# Patient Record
Sex: Male | Born: 1955 | Race: White | Hispanic: No | Marital: Married
Health system: Southern US, Academic
[De-identification: ages and names within clinical notes are randomized; demographics above are authoritative.]

## PROBLEM LIST (undated history)

## (undated) ENCOUNTER — Ambulatory Visit

## (undated) ENCOUNTER — Encounter

## (undated) ENCOUNTER — Encounter
Attending: Student in an Organized Health Care Education/Training Program | Primary: Student in an Organized Health Care Education/Training Program

## (undated) ENCOUNTER — Telehealth
Attending: Student in an Organized Health Care Education/Training Program | Primary: Student in an Organized Health Care Education/Training Program

## (undated) ENCOUNTER — Encounter: Attending: Gastroenterology | Primary: Gastroenterology

## (undated) ENCOUNTER — Encounter
Attending: Pharmacist Clinician (PhC)/ Clinical Pharmacy Specialist | Primary: Pharmacist Clinician (PhC)/ Clinical Pharmacy Specialist

## (undated) ENCOUNTER — Encounter: Payer: MEDICARE | Attending: Gastroenterology | Primary: Gastroenterology

## (undated) ENCOUNTER — Ambulatory Visit
Payer: MEDICARE | Attending: Student in an Organized Health Care Education/Training Program | Primary: Student in an Organized Health Care Education/Training Program

## (undated) ENCOUNTER — Telehealth

## (undated) ENCOUNTER — Encounter
Payer: Medicare (Managed Care) | Attending: Student in an Organized Health Care Education/Training Program | Primary: Student in an Organized Health Care Education/Training Program

## (undated) ENCOUNTER — Telehealth
Attending: Pharmacist Clinician (PhC)/ Clinical Pharmacy Specialist | Primary: Pharmacist Clinician (PhC)/ Clinical Pharmacy Specialist

## (undated) ENCOUNTER — Encounter: Attending: Internal Medicine | Primary: Internal Medicine

## (undated) ENCOUNTER — Telehealth: Attending: Gastroenterology | Primary: Gastroenterology

## (undated) ENCOUNTER — Ambulatory Visit
Attending: Student in an Organized Health Care Education/Training Program | Primary: Student in an Organized Health Care Education/Training Program

## (undated) ENCOUNTER — Ambulatory Visit: Payer: MEDICARE

## (undated) ENCOUNTER — Ambulatory Visit: Payer: Medicare (Managed Care)

## (undated) ENCOUNTER — Encounter: Attending: Family | Primary: Family

## (undated) ENCOUNTER — Ambulatory Visit
Payer: Medicare (Managed Care) | Attending: Student in an Organized Health Care Education/Training Program | Primary: Student in an Organized Health Care Education/Training Program

## (undated) DIAGNOSIS — R519 Headache, unspecified: Secondary | ICD-10-CM

## (undated) DIAGNOSIS — F329 Major depressive disorder, single episode, unspecified: Secondary | ICD-10-CM

## (undated) DIAGNOSIS — F319 Bipolar disorder, unspecified: Secondary | ICD-10-CM

## (undated) DIAGNOSIS — Z860100 Personal history of colon polyps, unspecified: Secondary | ICD-10-CM

## (undated) DIAGNOSIS — B181 Chronic viral hepatitis B without delta-agent: Secondary | ICD-10-CM

## (undated) DIAGNOSIS — K7460 Unspecified cirrhosis of liver: Secondary | ICD-10-CM

## (undated) DIAGNOSIS — F32A Depression, unspecified: Secondary | ICD-10-CM

## (undated) DIAGNOSIS — K219 Gastro-esophageal reflux disease without esophagitis: Secondary | ICD-10-CM

## (undated) DIAGNOSIS — R51 Headache: Secondary | ICD-10-CM

## (undated) DIAGNOSIS — R634 Abnormal weight loss: Secondary | ICD-10-CM

## (undated) DIAGNOSIS — B1910 Unspecified viral hepatitis B without hepatic coma: Secondary | ICD-10-CM

## (undated) HISTORY — DX: Chronic viral hepatitis B without delta-agent: B18.1

## (undated) HISTORY — DX: Major depressive disorder, single episode, unspecified: F32.9

## (undated) HISTORY — DX: Unspecified viral hepatitis B without hepatic coma: B19.10

## (undated) HISTORY — PX: POLYPECTOMY: SHX149

## (undated) HISTORY — PX: COLONOSCOPY: SHX174

## (undated) HISTORY — DX: Depression, unspecified: F32.A

## (undated) HISTORY — PX: APPENDECTOMY: SHX54

## (undated) HISTORY — DX: Unspecified cirrhosis of liver: K74.60

## (undated) HISTORY — DX: Personal history of colonic polyps: Z86.010

## (undated) HISTORY — DX: Headache, unspecified: R51.9

## (undated) HISTORY — DX: Personal history of colon polyps, unspecified: Z86.0100

## (undated) HISTORY — DX: Bipolar disorder, unspecified: F31.9

## (undated) HISTORY — DX: Headache: R51

## (undated) HISTORY — DX: Abnormal weight loss: R63.4

## (undated) HISTORY — PX: UPPER GASTROINTESTINAL ENDOSCOPY: SHX188

## (undated) HISTORY — DX: Gastro-esophageal reflux disease without esophagitis: K21.9

---
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
## 2010-06-15 ENCOUNTER — Ambulatory Visit (HOSPITAL_COMMUNITY): Payer: Self-pay | Admitting: Behavioral Health

## 2010-06-22 ENCOUNTER — Ambulatory Visit (HOSPITAL_COMMUNITY): Payer: Self-pay | Admitting: Behavioral Health

## 2010-06-23 ENCOUNTER — Ambulatory Visit (INDEPENDENT_AMBULATORY_CARE_PROVIDER_SITE_OTHER): Payer: Self-pay | Admitting: Behavioral Health

## 2010-06-23 DIAGNOSIS — F3112 Bipolar disorder, current episode manic without psychotic features, moderate: Secondary | ICD-10-CM

## 2010-07-01 ENCOUNTER — Encounter (HOSPITAL_COMMUNITY): Payer: Self-pay | Admitting: Behavioral Health

## 2010-07-01 ENCOUNTER — Emergency Department (HOSPITAL_COMMUNITY)
Admission: EM | Admit: 2010-07-01 | Discharge: 2010-07-04 | Disposition: A | Discharge status: Other Acute Inpatient Hospital | Payer: BC Managed Care – PPO | Attending: Emergency Medicine | Admitting: Emergency Medicine

## 2010-07-01 DIAGNOSIS — F319 Bipolar disorder, unspecified: Secondary | ICD-10-CM | POA: Insufficient documentation

## 2010-07-01 DIAGNOSIS — F17200 Nicotine dependence, unspecified, uncomplicated: Secondary | ICD-10-CM | POA: Insufficient documentation

## 2010-07-01 DIAGNOSIS — Z79899 Other long term (current) drug therapy: Secondary | ICD-10-CM | POA: Insufficient documentation

## 2010-07-01 LAB — COMPREHENSIVE METABOLIC PANEL
ALT: 38 U/L (ref 0–53)
AST: 30 U/L (ref 0–37)
Albumin: 3.6 g/dL (ref 3.5–5.2)
CO2: 29 mEq/L (ref 19–32)
Calcium: 8.6 mg/dL (ref 8.4–10.5)
GFR calc Af Amer: >60 mL/min (ref >60)
GFR calc non Af Amer: >60 mL/min (ref >60)
Sodium: 138 mEq/L (ref 135–145)
Total Protein: 6.2 g/dL (ref 6.0–8.3)

## 2010-07-01 LAB — RAPID URINE DRUG SCREEN, HOSP PERFORMED
Amphetamines: NOT DETECTED
Tetrahydrocannabinol: NOT DETECTED

## 2010-07-01 LAB — CBC
HCT: 40.5 % (ref 39.0–52.0)
RDW: 13.2 % (ref 11.5–15.5)
WBC: 7.5 10*3/uL (ref 4.0–10.5)

## 2010-07-01 LAB — DIFFERENTIAL
Basophils Absolute: 0 10*3/uL (ref 0.0–0.1)
Basophils Relative: 0 % (ref 0–1)
Eosinophils Relative: 1 % (ref 0–5)
Lymphocytes Relative: 27 % (ref 12–46)
Neutro Abs: 4.4 10*3/uL (ref 1.7–7.7)

## 2010-07-01 LAB — ETHANOL: Alcohol, Ethyl (B): 8 mg/dL (ref 0–10)

## 2010-07-02 DIAGNOSIS — F319 Bipolar disorder, unspecified: Secondary | ICD-10-CM

## 2010-07-03 DIAGNOSIS — F319 Bipolar disorder, unspecified: Secondary | ICD-10-CM

## 2010-07-04 ENCOUNTER — Inpatient Hospital Stay (HOSPITAL_COMMUNITY)
Admission: AD | Admit: 2010-07-04 | Discharge: 2010-07-15 | DRG: 430 | Disposition: A | Discharge status: Home / Self Care | Payer: BC Managed Care – PPO | Attending: Psychiatry | Admitting: Psychiatry

## 2010-07-04 DIAGNOSIS — F3110 Bipolar disorder, current episode manic without psychotic features, unspecified: Secondary | ICD-10-CM

## 2010-07-04 DIAGNOSIS — Z560 Unemployment, unspecified: Secondary | ICD-10-CM

## 2010-07-04 DIAGNOSIS — B181 Chronic viral hepatitis B without delta-agent: Secondary | ICD-10-CM

## 2010-07-04 DIAGNOSIS — Z91199 Patient's noncompliance with other medical treatment and regimen due to unspecified reason: Secondary | ICD-10-CM

## 2010-07-04 DIAGNOSIS — Z9119 Patient's noncompliance with other medical treatment and regimen: Secondary | ICD-10-CM

## 2010-07-04 DIAGNOSIS — F312 Bipolar disorder, current episode manic severe with psychotic features: Secondary | ICD-10-CM

## 2010-07-06 NOTE — H&P (Signed)
Angel [name], Angel [name]               ACCOUNT NO.:  [id number]  MEDICAL RECORD NO.:  [id number]           PATIENT TYPE:  I  LOCATION:  0404                          FACILITY:  [facility]  PHYSICIAN:  Eulogio Ditch, MD DATE OF BIRTH:  March 06, 1956  DATE OF ADMISSION:  07/04/2010 DATE OF DISCHARGE:                      PSYCHIATRIC ADMISSION ASSESSMENT   HISTORY OF PRESENT ILLNESS:  A 55 year old white male with a history of bipolar disorder type 1 living with same-sex partner and not working and was admitted on IVC.  The patient was noncompliant with his medications. Before admission to the hospital, the patient was on lithium and Klonopin.  The patient has a number of admissions in the past to [facility] to [facility] and [facility].  The patient does not follow any regular psychiatrist in the outpatient setting.  The patient told me he has bipolar started around 2000 and every 5 years he goes into the manic phase and when he goes into the manic phase, he is close to [place], he spends a lot of money, he helps other people.  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  The patient before coming to the hospital walked from [place] to [facility] for 30 miles from 10:00 p.m. to 8:00 a.m. continuously.  During the interview, the patient is having elated mood but he is not irritable.  His talkative but does not have pressured speech.  His sleep is fair.  He has flight of ideas.  He has grandiose delusions.  He has insight into his illness and on asking why he is not taking his medications, he told me that he ran out the medications.  The patient denies hearing any voices, does not seem to be paranoid.  The patient denies any suicidal ideations.  The patient asked me from which country I am. I told him I am from [place] and then he started discussing about [facility].  He has pretty good knowledge about [employer].  The patient  denies abuse of any drugs.    Psych medication:  The patient was on fluphenazine 1, lithium and Klonopin before admission to the hospital but he told me he does not like fluphenazine.    Medical history:  The patient has a history of a hepatitis B carrier.   Allergic to Lamictal.   His labs within normal limits done at [facility].  PHYSICAL EXAMINATION:  Within normal limits.  His lithium level at the time of admission was less than 0.25.  MENTAL STATUS EXAM:  The patient is calm, cooperative during the interview.  No psychomotor agitation or retardation noted during the interview.  No abnormal movements noted.  The patient has an elated mood, affect mood congruent.  Thought process:  The patient is having at certain times during interview circumstantiality and other times tangentiality.  Also having flight of ideas, but his thoughts are connected with each other.  Thought content:  Denies suicidal ideations. Grandiose delusions present.  Thought perception:  Denies audiovisual hallucination, does not seem to be internally preoccupied.  Cognition: Alert, awake, oriented x3.  Memory immediate, recent remote fair to poor attention and concentration fair.  Abstraction ability fair.  Insight and judgment poor.  DIAGNOSIS:  AXIS I:  Bipolar disorder type 1 in manic phase. AXIS II:  Deferred. AXIS III:  Hepatitis B carrier, no active medical issue. AXIS IV:  Noncompliant with his medications. AXIS V:  30-40.  TREATMENT PLAN: 1. The patient is on lithium 300 mg t.i.d. 2. Klonopin 0.5 b.i.d. 3. Risperdal 1 mg 3 times a day.  The patient will be continued on     these medications and will be slowly titrated on these medications. 4. Estimated length of stay in the hospital will be 1-2 weeks. 5. I discussed with the patient importance of compliance with     medications. 6. I told the patient that he should go to all the groups and remain     compliant with the staff and the  treatment during the hospital     stay. 7. We will speak with his partner to get more collateral information     on the patient and his previous records from the past     hospitalizations.     Eulogio Ditch, MD     SA/MEDQ  D:  07/05/2010  T:  07/05/2010  Job:  [id number]  Electronically Signed by Eulogio Ditch  on 07/06/2010 01:06:46 PM

## 2010-07-09 DIAGNOSIS — F3110 Bipolar disorder, current episode manic without psychotic features, unspecified: Secondary | ICD-10-CM

## 2010-07-11 ENCOUNTER — Encounter (HOSPITAL_COMMUNITY): Payer: Self-pay | Admitting: Behavioral Health

## 2010-07-14 LAB — COMPREHENSIVE METABOLIC PANEL
ALT: 41 U/L (ref 0–53)
AST: 22 U/L (ref 0–37)
Alkaline Phosphatase: 60 U/L (ref 39–117)
CO2: 27 mEq/L (ref 19–32)
Chloride: 106 mEq/L (ref 96–112)
GFR calc Af Amer: >60 mL/min (ref >60)
GFR calc non Af Amer: >60 mL/min (ref >60)
Glucose, Bld: 98 mg/dL (ref 70–99)
Potassium: 4 mEq/L (ref 3.5–5.1)
Sodium: 138 mEq/L (ref 135–145)
Total Bilirubin: 0.6 mg/dL (ref 0.3–1.2)

## 2010-07-14 LAB — LITHIUM LEVEL: Lithium Lvl: 0.53 mEq/L — ABNORMAL LOW (ref 0.80–1.40)

## 2010-07-22 ENCOUNTER — Encounter (INDEPENDENT_AMBULATORY_CARE_PROVIDER_SITE_OTHER): Payer: BC Managed Care – PPO | Admitting: Behavioral Health

## 2010-07-22 DIAGNOSIS — F3189 Other bipolar disorder: Secondary | ICD-10-CM

## 2010-07-28 ENCOUNTER — Encounter (HOSPITAL_COMMUNITY): Payer: BC Managed Care – PPO

## 2010-07-28 DIAGNOSIS — F3110 Bipolar disorder, current episode manic without psychotic features, unspecified: Secondary | ICD-10-CM

## 2010-08-04 NOTE — Discharge Summary (Signed)
NAMEMEER, REINDL               ACCOUNT NO.:  [id number]  MEDICAL RECORD NO.:  [id number]           PATIENT TYPE:  I  LOCATION:  0404                          FACILITY:  BH  PHYSICIAN:  Eulogio Ditch, MD DATE OF BIRTH:  Aug 24, 1955  DATE OF ADMISSION:  07/04/2010 DATE OF DISCHARGE:  07/15/2010                              DISCHARGE SUMMARY   IDENTIFYING INFORMATION:  This is a 55 year old Caucasian male.  This was an involuntary admission.  HISTORY OF PRESENT ILLNESS:  Angel [name] presented on involuntary commitment with an exacerbation of his of bipolar one disorder.  He had not currently been receiving any regular outpatient treatment and was aware that he was going into a manic phase which coincided with his usual 5-year cycle of having manic episodes.  He initially presented after having walked 30 miles from one county the [place] in search of care.  He presented with some elated mood, talkative with non-pressured speech, hyperverbal and some grandiose thinking.  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  He denied any suicidal intent.  Medical evaluation, diagnostic studies and physical exam were done in the [facility] emergency room. [name], healthy-appearing 55 year old who appears somewhat younger than his stated age. He has a history of being a hepatitis B carrier, but no other chronic medical conditions.  Basic diagnostic studies done in the emergency room were normal.  Lithium level at the time of admission was less than 0.25.  HOSPITAL COURSE:  He was admitted to our acute stabilization and intensive care unit and we elected to continue his lithium 300 mg t.i.d., Klonopin 0.5 mg b.i.d. and to that we elected to add Risperdal 1 mg t.i.d.  Estimated his length of stay at 1-2 weeks.  Throughout his stay Angel [name] remained appropriate with good  participation in group therapy and unit activities.  He was given an initial working diagnosis of bipolar disorder type 1, manic phase. He gave [place] permission to speak to his friend with whom he lives.  For the first week he continued to have some mildly intrusive verbal behavior and had decreased impulse control, but was generally cooperative.  Felt he was responding well to the medication and remained compliant with medications.  He received Risperdal Consta 25 mg IM on March 1st which he tolerated well.  He was briefly on one-to-one observation on March 3rd for some agitation after a disagreement about phone privileges but that resolved quickly.  He was directable and had no thoughts of harming himself or others.  On March 5th he was still articulating, having some intrusive thoughts and felt compelled to accomplish or do specific activities.  Still felt that his thoughts were going rather fast. Denying any suicidal or homicidal intent.  He sleep was continuing to improve and he felt that he was sleeping well.  LABORATORY DATA:  Performed on March 8th.  Normal electrolytes, BUN 11, creatinine 0.92.  Calcium level of 8.7. Normal liver enzymes. Lithium level of 0.53.  Sleep was improving but was still limited to  less than 4 hours a night.  He was frequently writing excessively and keeping a long diary through the night.  We elected to increase his Risperdal Consta to 50 mg every 2 weeks and he received an additional dose.  Risperdal was titrated to 3 mg p.o. q.h.s. and Klonopin 1 mg p.o. q.h.s., lithium was increased 1200 mg daily. By the 9th he was no longer writing excessively. He had stopped keeping a long diary and stopped writing excessive letters, felt that his thoughts had slowed down to normal and insight was much improved.  He was displaying no intrusive behavior. Concentration was much improved.  Continued to voice no dangerous ideas and felt ready to go home.  DISCHARGE  DIAGNOSES:  AXIS I: Bipolar disorder type 1, manic phase, resolving. AXIS II: No diagnosis. AXIS III: No diagnosis. AXIS IV: Deferred. AXIS V: Current 58, past year 68 estimated.  DISCHARGE MEDICATIONS:  Clonazepam 1 mg q.h.s., diphenhydramine 50 mg two capsules q.h.s., lithium carbonate 300 mg four times daily, Risperdal 3 mg tablet q.h.s., Risperdal Consta 50 mg IM every 14 days, next due March 20th, Abreva cream topical to cold sore five times daily as needed.  He was instructed to stop taking fluphenazine.  DISCHARGE FOLLOWUP:  He will follow-up counseling with Angel [name] in our [place] office on March 16th at 11:00 a.m. and follow up with Angel [name] in our [place] office on April 13th at 8:30 a.m.     Angel [name], N.P.   ______________________________ Eulogio Ditch, MD    MAS/MEDQ  D:  08/04/2010  T:  08/04/2010  Job:  [id number]  Electronically Signed by Kari Baars N.P. on 08/04/2010 10:07:44 AM Electronically Signed by Eulogio Ditch  on 08/04/2010 03:48:09 PM

## 2010-08-08 ENCOUNTER — Encounter (HOSPITAL_COMMUNITY): Payer: BC Managed Care – PPO | Admitting: Behavioral Health

## 2010-08-11 ENCOUNTER — Encounter (HOSPITAL_COMMUNITY): Payer: BC Managed Care – PPO

## 2010-08-11 DIAGNOSIS — F319 Bipolar disorder, unspecified: Secondary | ICD-10-CM

## 2010-08-15 ENCOUNTER — Encounter (HOSPITAL_COMMUNITY): Payer: BC Managed Care – PPO | Admitting: Behavioral Health

## 2010-08-17 ENCOUNTER — Encounter (INDEPENDENT_AMBULATORY_CARE_PROVIDER_SITE_OTHER): Payer: BC Managed Care – PPO | Admitting: Behavioral Health

## 2010-08-17 DIAGNOSIS — F3189 Other bipolar disorder: Secondary | ICD-10-CM

## 2010-08-19 ENCOUNTER — Ambulatory Visit (INDEPENDENT_AMBULATORY_CARE_PROVIDER_SITE_OTHER): Payer: BC Managed Care – PPO | Admitting: Psychiatry

## 2010-08-19 DIAGNOSIS — F319 Bipolar disorder, unspecified: Secondary | ICD-10-CM

## 2010-08-31 ENCOUNTER — Encounter (HOSPITAL_COMMUNITY): Payer: BC Managed Care – PPO

## 2010-08-31 DIAGNOSIS — F3110 Bipolar disorder, current episode manic without psychotic features, unspecified: Secondary | ICD-10-CM

## 2010-09-01 ENCOUNTER — Encounter (HOSPITAL_COMMUNITY): Payer: BC Managed Care – PPO | Admitting: Behavioral Health

## 2010-09-01 ENCOUNTER — Encounter (INDEPENDENT_AMBULATORY_CARE_PROVIDER_SITE_OTHER): Payer: BC Managed Care – PPO | Admitting: Behavioral Health

## 2010-09-01 DIAGNOSIS — F3110 Bipolar disorder, current episode manic without psychotic features, unspecified: Secondary | ICD-10-CM

## 2010-09-14 ENCOUNTER — Encounter (INDEPENDENT_AMBULATORY_CARE_PROVIDER_SITE_OTHER): Payer: BC Managed Care – PPO | Admitting: Behavioral Health

## 2010-09-14 DIAGNOSIS — F3110 Bipolar disorder, current episode manic without psychotic features, unspecified: Secondary | ICD-10-CM

## 2010-09-16 ENCOUNTER — Ambulatory Visit (HOSPITAL_COMMUNITY): Payer: BC Managed Care – PPO | Admitting: Physician Assistant

## 2010-09-16 DIAGNOSIS — F319 Bipolar disorder, unspecified: Secondary | ICD-10-CM

## 2010-09-28 ENCOUNTER — Encounter (INDEPENDENT_AMBULATORY_CARE_PROVIDER_SITE_OTHER): Payer: BC Managed Care – PPO | Admitting: Behavioral Health

## 2010-09-28 DIAGNOSIS — F3110 Bipolar disorder, current episode manic without psychotic features, unspecified: Secondary | ICD-10-CM

## 2010-10-13 ENCOUNTER — Encounter (HOSPITAL_COMMUNITY): Payer: BC Managed Care – PPO | Admitting: Physician Assistant

## 2010-10-14 ENCOUNTER — Encounter (HOSPITAL_COMMUNITY): Payer: BC Managed Care – PPO | Admitting: Physician Assistant

## 2010-10-17 ENCOUNTER — Encounter (HOSPITAL_COMMUNITY): Payer: BC Managed Care – PPO | Admitting: Behavioral Health

## 2010-10-24 ENCOUNTER — Encounter (INDEPENDENT_AMBULATORY_CARE_PROVIDER_SITE_OTHER): Payer: BC Managed Care – PPO | Admitting: Behavioral Health

## 2010-10-24 DIAGNOSIS — F3110 Bipolar disorder, current episode manic without psychotic features, unspecified: Secondary | ICD-10-CM

## 2010-10-27 ENCOUNTER — Encounter (HOSPITAL_COMMUNITY): Payer: BC Managed Care – PPO

## 2010-10-27 DIAGNOSIS — F3112 Bipolar disorder, current episode manic without psychotic features, moderate: Secondary | ICD-10-CM

## 2010-11-10 ENCOUNTER — Encounter (HOSPITAL_COMMUNITY): Payer: BC Managed Care – PPO | Admitting: Physician Assistant

## 2010-11-10 DIAGNOSIS — F339 Major depressive disorder, recurrent, unspecified: Secondary | ICD-10-CM

## 2010-11-14 ENCOUNTER — Encounter (INDEPENDENT_AMBULATORY_CARE_PROVIDER_SITE_OTHER): Payer: BC Managed Care – PPO | Admitting: Behavioral Health

## 2010-11-14 DIAGNOSIS — F3110 Bipolar disorder, current episode manic without psychotic features, unspecified: Secondary | ICD-10-CM

## 2010-11-24 ENCOUNTER — Encounter (HOSPITAL_COMMUNITY): Payer: BC Managed Care – PPO

## 2010-11-28 ENCOUNTER — Encounter (INDEPENDENT_AMBULATORY_CARE_PROVIDER_SITE_OTHER): Payer: BC Managed Care – PPO | Admitting: Behavioral Health

## 2010-11-28 DIAGNOSIS — F3110 Bipolar disorder, current episode manic without psychotic features, unspecified: Secondary | ICD-10-CM

## 2010-12-08 ENCOUNTER — Encounter (INDEPENDENT_AMBULATORY_CARE_PROVIDER_SITE_OTHER): Payer: BC Managed Care – PPO

## 2010-12-08 DIAGNOSIS — F3110 Bipolar disorder, current episode manic without psychotic features, unspecified: Secondary | ICD-10-CM

## 2010-12-12 ENCOUNTER — Encounter (INDEPENDENT_AMBULATORY_CARE_PROVIDER_SITE_OTHER): Payer: BC Managed Care – PPO | Admitting: Behavioral Health

## 2010-12-12 DIAGNOSIS — F3110 Bipolar disorder, current episode manic without psychotic features, unspecified: Secondary | ICD-10-CM

## 2010-12-22 ENCOUNTER — Encounter (HOSPITAL_COMMUNITY): Payer: BC Managed Care – PPO

## 2010-12-22 DIAGNOSIS — F319 Bipolar disorder, unspecified: Secondary | ICD-10-CM

## 2010-12-26 ENCOUNTER — Encounter (INDEPENDENT_AMBULATORY_CARE_PROVIDER_SITE_OTHER): Payer: BC Managed Care – PPO | Admitting: Behavioral Health

## 2010-12-26 DIAGNOSIS — F3110 Bipolar disorder, current episode manic without psychotic features, unspecified: Secondary | ICD-10-CM

## 2011-01-05 ENCOUNTER — Encounter (HOSPITAL_COMMUNITY): Payer: BC Managed Care – PPO

## 2011-01-05 ENCOUNTER — Encounter (INDEPENDENT_AMBULATORY_CARE_PROVIDER_SITE_OTHER): Payer: BC Managed Care – PPO

## 2011-01-05 DIAGNOSIS — F319 Bipolar disorder, unspecified: Secondary | ICD-10-CM

## 2011-01-11 ENCOUNTER — Encounter (INDEPENDENT_AMBULATORY_CARE_PROVIDER_SITE_OTHER): Payer: BC Managed Care – PPO | Admitting: Behavioral Health

## 2011-01-11 DIAGNOSIS — F3110 Bipolar disorder, current episode manic without psychotic features, unspecified: Secondary | ICD-10-CM

## 2011-01-12 ENCOUNTER — Encounter (INDEPENDENT_AMBULATORY_CARE_PROVIDER_SITE_OTHER): Payer: BC Managed Care – PPO | Admitting: Physician Assistant

## 2011-01-12 DIAGNOSIS — F319 Bipolar disorder, unspecified: Secondary | ICD-10-CM

## 2011-01-19 ENCOUNTER — Encounter (INDEPENDENT_AMBULATORY_CARE_PROVIDER_SITE_OTHER): Payer: BC Managed Care – PPO

## 2011-01-19 DIAGNOSIS — F319 Bipolar disorder, unspecified: Secondary | ICD-10-CM

## 2011-01-26 ENCOUNTER — Encounter (INDEPENDENT_AMBULATORY_CARE_PROVIDER_SITE_OTHER): Payer: BC Managed Care – PPO | Admitting: Behavioral Health

## 2011-01-26 DIAGNOSIS — F3110 Bipolar disorder, current episode manic without psychotic features, unspecified: Secondary | ICD-10-CM

## 2011-02-02 ENCOUNTER — Encounter (HOSPITAL_COMMUNITY): Payer: BC Managed Care – PPO

## 2011-02-06 ENCOUNTER — Encounter (HOSPITAL_COMMUNITY): Payer: BC Managed Care – PPO | Admitting: Physician Assistant

## 2011-02-08 ENCOUNTER — Encounter (INDEPENDENT_AMBULATORY_CARE_PROVIDER_SITE_OTHER): Payer: BC Managed Care – PPO | Admitting: Behavioral Health

## 2011-02-08 DIAGNOSIS — F3110 Bipolar disorder, current episode manic without psychotic features, unspecified: Secondary | ICD-10-CM

## 2011-02-15 ENCOUNTER — Encounter (HOSPITAL_COMMUNITY): Payer: BC Managed Care – PPO

## 2011-02-16 ENCOUNTER — Encounter (INDEPENDENT_AMBULATORY_CARE_PROVIDER_SITE_OTHER): Payer: BC Managed Care – PPO | Admitting: *Deleted

## 2011-02-16 DIAGNOSIS — F319 Bipolar disorder, unspecified: Secondary | ICD-10-CM

## 2011-02-21 ENCOUNTER — Encounter (HOSPITAL_COMMUNITY): Payer: BC Managed Care – PPO | Admitting: Behavioral Health

## 2011-02-22 ENCOUNTER — Encounter (INDEPENDENT_AMBULATORY_CARE_PROVIDER_SITE_OTHER): Payer: BC Managed Care – PPO | Admitting: Physician Assistant

## 2011-02-22 DIAGNOSIS — F319 Bipolar disorder, unspecified: Secondary | ICD-10-CM

## 2011-02-23 ENCOUNTER — Encounter (HOSPITAL_COMMUNITY): Payer: BC Managed Care – PPO

## 2011-03-01 ENCOUNTER — Encounter (INDEPENDENT_AMBULATORY_CARE_PROVIDER_SITE_OTHER): Payer: BC Managed Care – PPO | Admitting: *Deleted

## 2011-03-01 DIAGNOSIS — F319 Bipolar disorder, unspecified: Secondary | ICD-10-CM

## 2011-03-02 ENCOUNTER — Encounter (HOSPITAL_COMMUNITY): Payer: BC Managed Care – PPO

## 2011-03-07 ENCOUNTER — Encounter (INDEPENDENT_AMBULATORY_CARE_PROVIDER_SITE_OTHER): Payer: BC Managed Care – PPO | Admitting: Behavioral Health

## 2011-03-07 DIAGNOSIS — F3110 Bipolar disorder, current episode manic without psychotic features, unspecified: Secondary | ICD-10-CM

## 2011-03-15 ENCOUNTER — Ambulatory Visit (INDEPENDENT_AMBULATORY_CARE_PROVIDER_SITE_OTHER): Payer: BC Managed Care – PPO | Admitting: *Deleted

## 2011-03-15 DIAGNOSIS — F319 Bipolar disorder, unspecified: Secondary | ICD-10-CM

## 2011-03-15 MED ORDER — RISPERIDONE MICROSPHERES 50 MG IM SUSR
50.0000 mg | Freq: Once | INTRAMUSCULAR | Status: AC
  Administered 2011-03-15: 50 mg via INTRAMUSCULAR

## 2011-03-28 ENCOUNTER — Ambulatory Visit (INDEPENDENT_AMBULATORY_CARE_PROVIDER_SITE_OTHER): Payer: BC Managed Care – PPO | Admitting: *Deleted

## 2011-03-28 DIAGNOSIS — F3132 Bipolar disorder, current episode depressed, moderate: Secondary | ICD-10-CM

## 2011-03-28 MED ORDER — RISPERIDONE MICROSPHERES 50 MG IM SUSR: 50.0000 mg | INTRAMUSCULAR | Status: DC

## 2011-03-28 MED ORDER — RISPERIDONE MICROSPHERES 50 MG IM SUSR
50.0000 mg | INTRAMUSCULAR | Status: AC
  Administered 2011-03-28: 50 mg via INTRAMUSCULAR

## 2011-03-29 ENCOUNTER — Other Ambulatory Visit (HOSPITAL_COMMUNITY): Payer: Self-pay | Admitting: *Deleted

## 2011-03-29 MED ORDER — CLONAZEPAM 1 MG PO TABS: 1.0000 mg | ORAL_TABLET | Freq: Two times a day (BID) | ORAL | Status: DC

## 2011-04-04 ENCOUNTER — Encounter (HOSPITAL_COMMUNITY): Payer: BC Managed Care – PPO | Admitting: Behavioral Health

## 2011-04-12 ENCOUNTER — Ambulatory Visit (HOSPITAL_COMMUNITY): Payer: BC Managed Care – PPO | Admitting: Physician Assistant

## 2011-04-18 ENCOUNTER — Encounter (HOSPITAL_COMMUNITY): Payer: BC Managed Care – PPO | Admitting: Behavioral Health

## 2011-04-23 ENCOUNTER — Other Ambulatory Visit (HOSPITAL_COMMUNITY): Payer: Self-pay | Admitting: Physician Assistant

## 2011-04-24 ENCOUNTER — Other Ambulatory Visit (HOSPITAL_COMMUNITY): Payer: Self-pay | Admitting: Physician Assistant

## 2011-04-24 DIAGNOSIS — F319 Bipolar disorder, unspecified: Secondary | ICD-10-CM

## 2011-04-24 MED ORDER — VENLAFAXINE HCL ER 150 MG PO CP24: 150.0000 mg | ORAL_CAPSULE | Freq: Every day | ORAL | Status: DC

## 2011-04-25 ENCOUNTER — Other Ambulatory Visit (HOSPITAL_COMMUNITY): Payer: Self-pay | Admitting: Physician Assistant

## 2011-04-25 DIAGNOSIS — F319 Bipolar disorder, unspecified: Secondary | ICD-10-CM

## 2011-04-25 MED ORDER — RISPERIDONE MICROSPHERES 50 MG IM SUSR: 50.0000 mg | INTRAMUSCULAR | Status: DC

## 2011-04-27 ENCOUNTER — Ambulatory Visit (HOSPITAL_COMMUNITY): Payer: BC Managed Care – PPO

## 2011-04-27 ENCOUNTER — Ambulatory Visit (HOSPITAL_COMMUNITY): Payer: BC Managed Care – PPO | Admitting: *Deleted

## 2011-04-27 ENCOUNTER — Other Ambulatory Visit (HOSPITAL_COMMUNITY): Payer: Self-pay | Admitting: Physician Assistant

## 2011-04-27 DIAGNOSIS — F319 Bipolar disorder, unspecified: Secondary | ICD-10-CM

## 2011-04-27 MED ORDER — VENLAFAXINE HCL ER 150 MG PO CP24: ORAL_CAPSULE | ORAL | Status: DC

## 2011-05-10 ENCOUNTER — Other Ambulatory Visit (HOSPITAL_COMMUNITY): Payer: Self-pay | Admitting: Physician Assistant

## 2011-05-10 DIAGNOSIS — F3132 Bipolar disorder, current episode depressed, moderate: Secondary | ICD-10-CM

## 2011-05-11 ENCOUNTER — Other Ambulatory Visit (HOSPITAL_COMMUNITY): Payer: Self-pay | Admitting: Physician Assistant

## 2011-05-11 DIAGNOSIS — F3132 Bipolar disorder, current episode depressed, moderate: Secondary | ICD-10-CM

## 2011-05-12 ENCOUNTER — Other Ambulatory Visit (HOSPITAL_COMMUNITY): Payer: Self-pay

## 2011-05-12 ENCOUNTER — Other Ambulatory Visit (HOSPITAL_COMMUNITY): Payer: Self-pay | Admitting: Physician Assistant

## 2011-05-12 DIAGNOSIS — F319 Bipolar disorder, unspecified: Secondary | ICD-10-CM

## 2011-05-12 MED ORDER — CLONAZEPAM 1 MG PO TABS: 1.0000 mg | ORAL_TABLET | Freq: Two times a day (BID) | ORAL | Status: DC

## 2011-05-15 ENCOUNTER — Ambulatory Visit (HOSPITAL_COMMUNITY): Payer: BC Managed Care – PPO | Admitting: *Deleted

## 2011-05-15 DIAGNOSIS — F319 Bipolar disorder, unspecified: Secondary | ICD-10-CM

## 2011-05-15 MED ORDER — RISPERIDONE MICROSPHERES 50 MG IM SUSR
50.0000 mg | INTRAMUSCULAR | Status: DC
  Administered 2011-05-15 – 2011-07-10 (×4): 50 mg via INTRAMUSCULAR

## 2011-05-25 ENCOUNTER — Other Ambulatory Visit (HOSPITAL_COMMUNITY): Payer: Self-pay | Admitting: Physician Assistant

## 2011-05-25 DIAGNOSIS — F319 Bipolar disorder, unspecified: Secondary | ICD-10-CM

## 2011-05-29 ENCOUNTER — Ambulatory Visit (HOSPITAL_COMMUNITY): Payer: BC Managed Care – PPO | Admitting: *Deleted

## 2011-06-01 ENCOUNTER — Ambulatory Visit (INDEPENDENT_AMBULATORY_CARE_PROVIDER_SITE_OTHER): Payer: BC Managed Care – PPO | Admitting: Physician Assistant

## 2011-06-01 DIAGNOSIS — F319 Bipolar disorder, unspecified: Secondary | ICD-10-CM

## 2011-06-05 NOTE — Progress Notes (Signed)
   [facility] Behavioral Health Follow-up Outpatient Visit  Angel [name] August 19, 1955  Date: 06/01/11   Subjective: Angel [name] was seen with his partner today, to followup on his medications for bipolar disorder. He reports that he has been doing very well, that his mood has been stable, he has no suicidal or homicidal ideation, he has not experienced any auditory or visual hallucinations. He is sleeping well, if not maybe a little too much. His partner feels that Angel [name] sleeps through the day after Angel [name] takes him to work.  There were no vitals filed for this visit.  Mental Status Examination  Appearance: Well groomed and casually dressed Alert: Yes Attention: good  Cooperative: Yes Eye Contact: Good Speech: Clear and even Psychomotor Activity: Normal Memory/Concentration: Intact Oriented: person, place, time/date and situation Mood: Euthymic Affect: Blunt Thought Processes and Associations: Goal Directed, Intact and Linear Fund of Knowledge: Good Thought Content:  Insight: Good Judgement: Good  Diagnosis: Bipolar disorder not otherwise specified  Treatment Plan: Continue current medications and check laboratory studies. Followup in 3 months.  Angel Mesick, PA

## 2011-06-06 ENCOUNTER — Other Ambulatory Visit (HOSPITAL_COMMUNITY): Payer: Self-pay | Admitting: Physician Assistant

## 2011-06-06 DIAGNOSIS — F319 Bipolar disorder, unspecified: Secondary | ICD-10-CM

## 2011-06-13 ENCOUNTER — Ambulatory Visit (HOSPITAL_COMMUNITY): Payer: BC Managed Care – PPO | Admitting: *Deleted

## 2011-06-13 ENCOUNTER — Other Ambulatory Visit (HOSPITAL_COMMUNITY): Payer: Self-pay | Admitting: *Deleted

## 2011-06-13 DIAGNOSIS — F319 Bipolar disorder, unspecified: Secondary | ICD-10-CM

## 2011-06-13 MED ORDER — CLONAZEPAM 1 MG PO TABS: 1.0000 mg | ORAL_TABLET | Freq: Two times a day (BID) | ORAL | Status: DC

## 2011-06-26 ENCOUNTER — Ambulatory Visit (INDEPENDENT_AMBULATORY_CARE_PROVIDER_SITE_OTHER): Payer: BC Managed Care – PPO | Admitting: *Deleted

## 2011-06-26 DIAGNOSIS — F319 Bipolar disorder, unspecified: Secondary | ICD-10-CM

## 2011-07-10 ENCOUNTER — Other Ambulatory Visit (HOSPITAL_COMMUNITY): Payer: Self-pay | Admitting: *Deleted

## 2011-07-10 ENCOUNTER — Ambulatory Visit (INDEPENDENT_AMBULATORY_CARE_PROVIDER_SITE_OTHER): Payer: BC Managed Care – PPO | Admitting: *Deleted

## 2011-07-10 DIAGNOSIS — F319 Bipolar disorder, unspecified: Secondary | ICD-10-CM

## 2011-07-10 MED ORDER — RISPERIDONE MICROSPHERES 50 MG IM SUSR: 50.0000 mg | INTRAMUSCULAR | Status: DC

## 2011-07-10 MED ORDER — CLONAZEPAM 1 MG PO TABS: 1.0000 mg | ORAL_TABLET | Freq: Two times a day (BID) | ORAL | Status: DC

## 2011-07-26 ENCOUNTER — Ambulatory Visit (INDEPENDENT_AMBULATORY_CARE_PROVIDER_SITE_OTHER): Payer: BC Managed Care – PPO | Admitting: *Deleted

## 2011-07-26 DIAGNOSIS — Z79899 Other long term (current) drug therapy: Secondary | ICD-10-CM

## 2011-07-26 DIAGNOSIS — F319 Bipolar disorder, unspecified: Secondary | ICD-10-CM

## 2011-07-26 MED ORDER — RISPERIDONE MICROSPHERES 50 MG IM SUSR
50.0000 mg | INTRAMUSCULAR | Status: DC
  Administered 2011-07-26: 50 mg via INTRAMUSCULAR

## 2011-07-26 NOTE — Progress Notes (Signed)
Addended by: Tonny Bollman on: 07/26/2011 04:04 PM   Modules accepted: Orders

## 2011-07-26 NOTE — Progress Notes (Signed)
Addended by: Tonny Bollman on: 07/26/2011 04:02 PM   Modules accepted: Orders

## 2011-07-26 NOTE — Progress Notes (Signed)
Addended by: Tonny Bollman on: 07/26/2011 04:12 PM   Modules accepted: Orders

## 2011-08-14 ENCOUNTER — Other Ambulatory Visit (HOSPITAL_COMMUNITY): Payer: Self-pay | Admitting: *Deleted

## 2011-08-14 DIAGNOSIS — F319 Bipolar disorder, unspecified: Secondary | ICD-10-CM

## 2011-08-14 MED ORDER — RISPERIDONE MICROSPHERES 50 MG IM SUSR: 50.0000 mg | INTRAMUSCULAR | Status: DC

## 2011-08-15 ENCOUNTER — Ambulatory Visit (INDEPENDENT_AMBULATORY_CARE_PROVIDER_SITE_OTHER): Payer: BC Managed Care – PPO | Admitting: *Deleted

## 2011-08-15 DIAGNOSIS — F319 Bipolar disorder, unspecified: Secondary | ICD-10-CM

## 2011-08-15 MED ORDER — RISPERIDONE MICROSPHERES 50 MG IM SUSR
50.0000 mg | INTRAMUSCULAR | Status: DC
  Administered 2011-08-15 – 2011-12-07 (×6): 50 mg via INTRAMUSCULAR

## 2011-08-16 ENCOUNTER — Other Ambulatory Visit (HOSPITAL_COMMUNITY): Payer: Self-pay | Admitting: *Deleted

## 2011-08-16 DIAGNOSIS — F319 Bipolar disorder, unspecified: Secondary | ICD-10-CM

## 2011-08-16 MED ORDER — CLONAZEPAM 1 MG PO TABS: 1.0000 mg | ORAL_TABLET | Freq: Two times a day (BID) | ORAL | Status: DC

## 2011-08-24 ENCOUNTER — Other Ambulatory Visit (HOSPITAL_COMMUNITY): Payer: Self-pay | Admitting: Physician Assistant

## 2011-08-24 DIAGNOSIS — F319 Bipolar disorder, unspecified: Secondary | ICD-10-CM

## 2011-08-29 ENCOUNTER — Encounter (HOSPITAL_COMMUNITY): Payer: Self-pay

## 2011-08-31 ENCOUNTER — Ambulatory Visit (INDEPENDENT_AMBULATORY_CARE_PROVIDER_SITE_OTHER): Payer: BC Managed Care – PPO | Admitting: Physician Assistant

## 2011-08-31 DIAGNOSIS — F319 Bipolar disorder, unspecified: Secondary | ICD-10-CM

## 2011-09-03 DIAGNOSIS — F319 Bipolar disorder, unspecified: Secondary | ICD-10-CM | POA: Insufficient documentation

## 2011-09-03 NOTE — Progress Notes (Signed)
   [facility] Behavioral Health Follow-up Outpatient Visit  Tex Conroy May 31, 1955  Date: 08/31/2011   Subjective: Angel [name] presents today with his partner, Maryln Gottron, to follow up on medications prescribed for bipolar disorder. He reports that he has been doing very well. He denies any recent bouts with depression, and no or mania. His sleep is good and his appetite is also good. He feels that the current regimen of medications are working well. He denies any suicidal or homicidal ideation. He denies any auditory or visual hallucinations.  There were no vitals filed for this visit.  Mental Status Examination  Appearance: Well groomed and dressed Alert: Yes Attention: good  Cooperative: Yes Eye Contact: Good Speech: Clear and even Psychomotor Activity: Decreased Memory/Concentration: Intact Oriented: person, place, time/date and situation Mood: Dysphoric Affect: Blunt Thought Processes and Associations: Logical Fund of Knowledge: Good Thought Content: Normal Insight: Good Judgement: Good  Diagnosis: Bipolar disorder NOS  Treatment Plan: We will continue his Risperdal Consta 50 mg IM every 2 weeks, Risperdal tablet 2 mg at bedtime, Effexor XR 450 mg daily and Klonopin 1 mg twice daily. He will followup in about 2 months.  Nealy Hickmon, PA-C

## 2011-09-04 ENCOUNTER — Ambulatory Visit (INDEPENDENT_AMBULATORY_CARE_PROVIDER_SITE_OTHER): Payer: BC Managed Care – PPO | Admitting: *Deleted

## 2011-09-04 DIAGNOSIS — F319 Bipolar disorder, unspecified: Secondary | ICD-10-CM

## 2011-09-18 ENCOUNTER — Other Ambulatory Visit (HOSPITAL_COMMUNITY): Payer: Self-pay | Admitting: *Deleted

## 2011-09-18 DIAGNOSIS — F319 Bipolar disorder, unspecified: Secondary | ICD-10-CM

## 2011-09-18 MED ORDER — CLONAZEPAM 1 MG PO TABS: 1.0000 mg | ORAL_TABLET | Freq: Two times a day (BID) | ORAL | Status: DC

## 2011-09-19 ENCOUNTER — Ambulatory Visit (HOSPITAL_COMMUNITY): Payer: Self-pay | Admitting: *Deleted

## 2011-09-20 ENCOUNTER — Ambulatory Visit (INDEPENDENT_AMBULATORY_CARE_PROVIDER_SITE_OTHER): Payer: BC Managed Care – PPO | Admitting: *Deleted

## 2011-09-20 DIAGNOSIS — F319 Bipolar disorder, unspecified: Secondary | ICD-10-CM

## 2011-09-28 ENCOUNTER — Other Ambulatory Visit (HOSPITAL_COMMUNITY): Payer: Self-pay | Admitting: Psychiatry

## 2011-10-05 ENCOUNTER — Ambulatory Visit (INDEPENDENT_AMBULATORY_CARE_PROVIDER_SITE_OTHER): Payer: BC Managed Care – PPO | Admitting: *Deleted

## 2011-10-05 DIAGNOSIS — F319 Bipolar disorder, unspecified: Secondary | ICD-10-CM

## 2011-10-14 ENCOUNTER — Other Ambulatory Visit (HOSPITAL_COMMUNITY): Payer: Self-pay | Admitting: Physician Assistant

## 2011-10-19 ENCOUNTER — Other Ambulatory Visit (HOSPITAL_COMMUNITY): Payer: Self-pay

## 2011-10-19 ENCOUNTER — Ambulatory Visit (HOSPITAL_COMMUNITY): Payer: BC Managed Care – PPO | Admitting: *Deleted

## 2011-10-19 NOTE — Progress Notes (Unsigned)
Patient ID: Angel [name], male   DOB: 1955/08/26, 56 y.o.   MRN: [id number] Patient came in to [facility] for his risperiDONE microspheres (RISPERDAL CONSTA) 50 MG injection. Injection given in right upper outer quadrant. Patient supplied own medication.  Joice Lofts RN MS EdS 10/19/2011  3:41 PM

## 2011-11-06 ENCOUNTER — Ambulatory Visit (INDEPENDENT_AMBULATORY_CARE_PROVIDER_SITE_OTHER): Payer: BC Managed Care – PPO | Admitting: *Deleted

## 2011-11-06 DIAGNOSIS — F319 Bipolar disorder, unspecified: Secondary | ICD-10-CM

## 2011-11-21 ENCOUNTER — Ambulatory Visit (INDEPENDENT_AMBULATORY_CARE_PROVIDER_SITE_OTHER): Payer: BC Managed Care – PPO | Admitting: *Deleted

## 2011-11-21 DIAGNOSIS — F319 Bipolar disorder, unspecified: Secondary | ICD-10-CM

## 2011-11-24 ENCOUNTER — Other Ambulatory Visit (HOSPITAL_COMMUNITY): Payer: Self-pay | Admitting: Psychology

## 2011-11-24 DIAGNOSIS — F319 Bipolar disorder, unspecified: Secondary | ICD-10-CM

## 2011-11-24 MED ORDER — CLONAZEPAM 1 MG PO TABS: 1.0000 mg | ORAL_TABLET | Freq: Two times a day (BID) | ORAL | Status: DC

## 2011-11-27 ENCOUNTER — Other Ambulatory Visit (HOSPITAL_COMMUNITY): Payer: Self-pay | Admitting: *Deleted

## 2011-11-30 ENCOUNTER — Ambulatory Visit (INDEPENDENT_AMBULATORY_CARE_PROVIDER_SITE_OTHER): Payer: BC Managed Care – PPO | Admitting: Physician Assistant

## 2011-11-30 DIAGNOSIS — F319 Bipolar disorder, unspecified: Secondary | ICD-10-CM

## 2011-11-30 MED ORDER — VENLAFAXINE HCL ER 150 MG PO CP24: 450.0000 mg | ORAL_CAPSULE | Freq: Every day | ORAL | Status: DC

## 2011-11-30 MED ORDER — CLONAZEPAM 1 MG PO TABS: 1.0000 mg | ORAL_TABLET | Freq: Two times a day (BID) | ORAL | Status: DC

## 2011-11-30 MED ORDER — RISPERIDONE MICROSPHERES 50 MG IM SUSR: 50.0000 mg | INTRAMUSCULAR | Status: DC

## 2011-11-30 NOTE — Progress Notes (Signed)
   [facility] Behavioral Health Follow-up Outpatient Visit  Angel [name] [birth year]  Date: 11/30/2011   Subjective: Angel [name] presents today, with his partner Angel [name], to followup on history meds for bipolar disorder. He reports that overall he has been doing well. She did miss one morning doses of his medications, and felt the impact of that for nearly 2 days afterwards. He reports that he is sleeping well and his appetite is good. He denies any recent manic behavior, but did have dosed to down days after missing his medications. He denies any suicidal or homicidal ideation. He denies any auditory or visual hallucinations.  There were no vitals filed for this visit.  Mental Status Examination  Appearance: Well groomed and casually dressed. Alert: Yes Attention: good  Cooperative: Yes Eye Contact: Good Speech: Clear and coherent Psychomotor Activity: Decreased Memory/Concentration: Intact Oriented: person, place, time/date and situation Mood: Dysphoric Affect: Blunt Thought Processes and Associations: Linear Fund of Knowledge: Good Thought Content: Normal Insight: Fair Judgement: Good  Diagnosis: Bipolar 1  Treatment Plan: We'll continue his Klonopin 1 mg twice daily, lithium 600 mg twice daily, Risperdal 1 mg at bedtime (2 mg if needed), Risperdal Consta 50 mg every 14 days, and Effexor XR 450 mg daily. He will return for followup in 3 months.  Fran Neiswonger, PA-C

## 2011-12-07 ENCOUNTER — Ambulatory Visit (INDEPENDENT_AMBULATORY_CARE_PROVIDER_SITE_OTHER): Payer: BC Managed Care – PPO | Admitting: *Deleted

## 2011-12-07 DIAGNOSIS — F319 Bipolar disorder, unspecified: Secondary | ICD-10-CM

## 2011-12-13 ENCOUNTER — Other Ambulatory Visit (HOSPITAL_COMMUNITY): Payer: Self-pay | Admitting: Physician Assistant

## 2011-12-13 DIAGNOSIS — F319 Bipolar disorder, unspecified: Secondary | ICD-10-CM

## 2011-12-20 ENCOUNTER — Other Ambulatory Visit (HOSPITAL_COMMUNITY): Payer: Self-pay | Admitting: Physician Assistant

## 2011-12-20 DIAGNOSIS — F319 Bipolar disorder, unspecified: Secondary | ICD-10-CM

## 2011-12-25 ENCOUNTER — Ambulatory Visit (HOSPITAL_COMMUNITY): Payer: Self-pay | Admitting: *Deleted

## 2011-12-26 ENCOUNTER — Ambulatory Visit (INDEPENDENT_AMBULATORY_CARE_PROVIDER_SITE_OTHER): Payer: BC Managed Care – PPO | Admitting: *Deleted

## 2011-12-26 DIAGNOSIS — F319 Bipolar disorder, unspecified: Secondary | ICD-10-CM

## 2011-12-26 MED ORDER — RISPERIDONE MICROSPHERES 50 MG IM SUSR
50.0000 mg | INTRAMUSCULAR | Status: DC
  Administered 2011-12-26: 50 mg via INTRAMUSCULAR

## 2012-01-05 ENCOUNTER — Other Ambulatory Visit (HOSPITAL_COMMUNITY): Payer: Self-pay | Admitting: Physician Assistant

## 2012-01-11 ENCOUNTER — Ambulatory Visit (INDEPENDENT_AMBULATORY_CARE_PROVIDER_SITE_OTHER): Payer: BC Managed Care – PPO | Admitting: *Deleted

## 2012-01-11 DIAGNOSIS — F319 Bipolar disorder, unspecified: Secondary | ICD-10-CM

## 2012-01-11 MED ORDER — RISPERIDONE MICROSPHERES 50 MG IM SUSR
50.0000 mg | INTRAMUSCULAR | Status: DC
  Administered 2012-01-11: 50 mg via INTRAMUSCULAR

## 2012-01-24 ENCOUNTER — Other Ambulatory Visit (HOSPITAL_COMMUNITY): Payer: Self-pay | Admitting: Physician Assistant

## 2012-01-24 DIAGNOSIS — F319 Bipolar disorder, unspecified: Secondary | ICD-10-CM

## 2012-01-25 ENCOUNTER — Ambulatory Visit (INDEPENDENT_AMBULATORY_CARE_PROVIDER_SITE_OTHER): Payer: BC Managed Care – PPO | Admitting: *Deleted

## 2012-01-25 DIAGNOSIS — F319 Bipolar disorder, unspecified: Secondary | ICD-10-CM

## 2012-01-25 MED ORDER — RISPERIDONE MICROSPHERES 50 MG IM SUSR
50.0000 mg | INTRAMUSCULAR | Status: DC
  Administered 2012-01-25: 50 mg via INTRAMUSCULAR

## 2012-02-08 ENCOUNTER — Ambulatory Visit (INDEPENDENT_AMBULATORY_CARE_PROVIDER_SITE_OTHER): Payer: BC Managed Care – PPO | Admitting: *Deleted

## 2012-02-08 DIAGNOSIS — F319 Bipolar disorder, unspecified: Secondary | ICD-10-CM

## 2012-02-08 MED ORDER — RISPERIDONE MICROSPHERES 50 MG IM SUSR: 50.0000 mg | INTRAMUSCULAR | Status: DC

## 2012-02-08 NOTE — Progress Notes (Signed)
One Injection given. Unable to delete duplicate entry.

## 2012-02-22 ENCOUNTER — Ambulatory Visit (INDEPENDENT_AMBULATORY_CARE_PROVIDER_SITE_OTHER): Payer: BC Managed Care – PPO | Admitting: *Deleted

## 2012-02-22 DIAGNOSIS — F319 Bipolar disorder, unspecified: Secondary | ICD-10-CM

## 2012-02-22 MED ORDER — RISPERIDONE MICROSPHERES 50 MG IM SUSR
50.0000 mg | INTRAMUSCULAR | Status: DC
  Administered 2012-02-22: 50 mg via INTRAMUSCULAR

## 2012-03-04 ENCOUNTER — Ambulatory Visit (HOSPITAL_COMMUNITY): Payer: BC Managed Care – PPO | Admitting: Physician Assistant

## 2012-03-11 ENCOUNTER — Ambulatory Visit (HOSPITAL_COMMUNITY): Payer: Self-pay | Admitting: *Deleted

## 2012-03-18 ENCOUNTER — Ambulatory Visit (INDEPENDENT_AMBULATORY_CARE_PROVIDER_SITE_OTHER): Payer: BC Managed Care – PPO | Admitting: *Deleted

## 2012-03-18 ENCOUNTER — Other Ambulatory Visit (HOSPITAL_COMMUNITY): Payer: Self-pay | Admitting: Physician Assistant

## 2012-03-18 DIAGNOSIS — F319 Bipolar disorder, unspecified: Secondary | ICD-10-CM

## 2012-03-18 MED ORDER — RISPERIDONE MICROSPHERES 50 MG IM SUSR: 50.0000 mg | Freq: Once | INTRAMUSCULAR | Status: DC

## 2012-03-21 ENCOUNTER — Other Ambulatory Visit (HOSPITAL_COMMUNITY): Payer: Self-pay | Admitting: Physician Assistant

## 2012-04-02 ENCOUNTER — Ambulatory Visit (INDEPENDENT_AMBULATORY_CARE_PROVIDER_SITE_OTHER): Payer: BC Managed Care – PPO | Admitting: *Deleted

## 2012-04-02 VITALS — BP 113/73 | HR 77 | Wt 250.4 lb

## 2012-04-02 DIAGNOSIS — F319 Bipolar disorder, unspecified: Secondary | ICD-10-CM

## 2012-04-02 MED ORDER — RISPERIDONE MICROSPHERES 50 MG IM SUSR
50.0000 mg | INTRAMUSCULAR | Status: DC
  Administered 2012-04-02: 50 mg via INTRAMUSCULAR

## 2012-04-16 ENCOUNTER — Ambulatory Visit (INDEPENDENT_AMBULATORY_CARE_PROVIDER_SITE_OTHER): Payer: BC Managed Care – PPO | Admitting: *Deleted

## 2012-04-16 VITALS — Wt 245.0 lb

## 2012-04-16 DIAGNOSIS — F319 Bipolar disorder, unspecified: Secondary | ICD-10-CM

## 2012-04-16 MED ORDER — RISPERIDONE MICROSPHERES 50 MG IM SUSR
50.0000 mg | INTRAMUSCULAR | Status: DC
  Administered 2012-04-16: 50 mg via INTRAMUSCULAR

## 2012-04-29 ENCOUNTER — Ambulatory Visit (INDEPENDENT_AMBULATORY_CARE_PROVIDER_SITE_OTHER): Payer: BC Managed Care – PPO | Admitting: *Deleted

## 2012-04-29 VITALS — Wt 245.0 lb

## 2012-04-29 DIAGNOSIS — F319 Bipolar disorder, unspecified: Secondary | ICD-10-CM

## 2012-04-29 MED ORDER — RISPERIDONE MICROSPHERES 50 MG IM SUSR
50.0000 mg | INTRAMUSCULAR | Status: DC
  Administered 2012-04-29: 50 mg via INTRAMUSCULAR

## 2012-05-06 ENCOUNTER — Other Ambulatory Visit (HOSPITAL_COMMUNITY): Payer: Self-pay | Admitting: Physician Assistant

## 2012-05-06 ENCOUNTER — Ambulatory Visit (INDEPENDENT_AMBULATORY_CARE_PROVIDER_SITE_OTHER): Payer: BC Managed Care – PPO | Admitting: Physician Assistant

## 2012-05-06 DIAGNOSIS — F319 Bipolar disorder, unspecified: Secondary | ICD-10-CM

## 2012-05-06 MED ORDER — RISPERIDONE 1 MG PO TABS: 0.5000 mg | ORAL_TABLET | Freq: Every day | ORAL | Status: DC

## 2012-05-06 NOTE — Progress Notes (Signed)
   [facility] Behavioral Health Follow-up Outpatient Visit  Angel [name] February 16, 1956  Date: 05/06/2012   Subjective: Angel [name] reports that he has been doing quite well. He is sleeping well, and he has lost 22 pounds over a 4 to five-week period by changing his diet. His partner, [name], who reports that he did have some depression when he first started the diet, but that was found to be because he was not eating lunch. He is now having a mid day meal and his mood has improved. We discussed trying to decrease the dose of oral Risperdal and he is agreeable. He denies any suicidal or homicidal ideation. He denies any auditory or visual hallucinations. He knows that his first symptoms of mania are a decreased need for sleep, and increased spending.  There were no vitals filed for this visit.  Mental Status Examination  Appearance: Well groomed and casually dressed Alert: Yes Attention: good  Cooperative: Yes Eye Contact: Good Speech: Clear and coherent Psychomotor Activity: Decreased Memory/Concentration: Intact Oriented: person, place, time/date and situation Mood: Dysphoric Affect: Blunt Thought Processes and Associations: Linear Fund of Knowledge: Good Thought Content: Normal Insight: Good Judgement: Good  Diagnosis: Bipolar disorder NOS  Treatment Plan: We will decrease his Risperdal to one half milligram at bedtime, but he may take a 1 mg tablet if necessary. We will continue his other medications as currently prescribed. He will return for followup in 2 months.  Lorri Fukuhara, PA-C

## 2012-05-13 ENCOUNTER — Ambulatory Visit (INDEPENDENT_AMBULATORY_CARE_PROVIDER_SITE_OTHER): Payer: BC Managed Care – PPO | Admitting: *Deleted

## 2012-05-13 VITALS — Wt 242.8 lb

## 2012-05-13 DIAGNOSIS — F319 Bipolar disorder, unspecified: Secondary | ICD-10-CM

## 2012-05-13 MED ORDER — RISPERIDONE MICROSPHERES 50 MG IM SUSR
50.0000 mg | INTRAMUSCULAR | Status: DC
  Administered 2012-05-13: 50 mg via INTRAMUSCULAR

## 2012-05-23 ENCOUNTER — Other Ambulatory Visit (HOSPITAL_COMMUNITY): Payer: Self-pay | Admitting: Physician Assistant

## 2012-05-27 ENCOUNTER — Ambulatory Visit (INDEPENDENT_AMBULATORY_CARE_PROVIDER_SITE_OTHER): Payer: BC Managed Care – PPO | Admitting: *Deleted

## 2012-05-27 ENCOUNTER — Telehealth (HOSPITAL_COMMUNITY): Payer: Self-pay

## 2012-05-27 VITALS — Wt 237.0 lb

## 2012-05-27 DIAGNOSIS — F319 Bipolar disorder, unspecified: Secondary | ICD-10-CM

## 2012-05-27 MED ORDER — RISPERIDONE MICROSPHERES 50 MG IM SUSR
50.0000 mg | INTRAMUSCULAR | Status: DC
  Administered 2012-05-27: 50 mg via INTRAMUSCULAR

## 2012-05-27 NOTE — Telephone Encounter (Signed)
9:09am 05/27/12 called and left msg that Sandi-nurse will be here for hm to come at 2:30pm for his injection./sh

## 2012-06-10 ENCOUNTER — Ambulatory Visit (INDEPENDENT_AMBULATORY_CARE_PROVIDER_SITE_OTHER): Payer: BC Managed Care – PPO | Admitting: *Deleted

## 2012-06-10 VITALS — Wt 236.0 lb

## 2012-06-10 DIAGNOSIS — F319 Bipolar disorder, unspecified: Secondary | ICD-10-CM

## 2012-06-10 MED ORDER — RISPERIDONE MICROSPHERES 50 MG IM SUSR
50.0000 mg | INTRAMUSCULAR | Status: DC
  Administered 2012-06-10: 50 mg via INTRAMUSCULAR

## 2012-06-21 ENCOUNTER — Other Ambulatory Visit (HOSPITAL_COMMUNITY): Payer: Self-pay | Admitting: Physician Assistant

## 2012-06-24 ENCOUNTER — Ambulatory Visit (INDEPENDENT_AMBULATORY_CARE_PROVIDER_SITE_OTHER): Payer: BC Managed Care – PPO | Admitting: *Deleted

## 2012-06-24 VITALS — Wt 237.4 lb

## 2012-06-24 DIAGNOSIS — F319 Bipolar disorder, unspecified: Secondary | ICD-10-CM

## 2012-06-24 MED ORDER — RISPERIDONE MICROSPHERES 50 MG IM SUSR
50.0000 mg | INTRAMUSCULAR | Status: DC
  Administered 2012-06-24: 50 mg via INTRAMUSCULAR

## 2012-07-08 ENCOUNTER — Ambulatory Visit (HOSPITAL_COMMUNITY): Payer: Self-pay | Admitting: *Deleted

## 2012-07-09 ENCOUNTER — Ambulatory Visit (INDEPENDENT_AMBULATORY_CARE_PROVIDER_SITE_OTHER): Payer: BC Managed Care – PPO | Admitting: Physician Assistant

## 2012-07-09 ENCOUNTER — Ambulatory Visit (INDEPENDENT_AMBULATORY_CARE_PROVIDER_SITE_OTHER): Payer: BC Managed Care – PPO | Admitting: *Deleted

## 2012-07-09 VITALS — Wt 234.8 lb

## 2012-07-09 DIAGNOSIS — F319 Bipolar disorder, unspecified: Secondary | ICD-10-CM

## 2012-07-09 MED ORDER — RISPERIDONE MICROSPHERES 50 MG IM SUSR
50.0000 mg | INTRAMUSCULAR | Status: DC
  Administered 2012-07-09: 50 mg via INTRAMUSCULAR

## 2012-07-09 NOTE — Progress Notes (Signed)
   [facility] Behavioral Health Follow-up Outpatient Visit  Raidyn Wassink May 07, 1956  Date: 07/09/2012   Subjective: Angel [name] presents today to followup on his treatment for bipolar disorder. At his last appointment we decreased his oral Risperdal from 1 mg to one half milligram at bedtime. He reports that his mood has been stable. He continues to sleep well, and he has not noticed any signs of mania. He reports he was unable to sleep well last night because he has an abscess in his cheek which kept him awake. He has lost a few more pounds, and reports that he is down a total of 30 pounds. He states he has lost all awake he can by adjusting his diet, and now he plans to begin an exercise routine. He and his partner have purchased an elliptical machine, but he has not started using it yet. He denies any suicidal or homicidal ideation. He denies any auditory or visual hallucinations.   There were no vitals filed for this visit.  Mental Status Examination  Appearance: Well groomed and casually dressed Alert: Yes Attention: good  Cooperative: Yes Eye Contact: Good Speech: Clear and coherent Psychomotor Activity: Normal Memory/Concentration: Intact Oriented: person, place, time/date and situation Mood: Euthymic Affect: Appropriate Thought Processes and Associations: Linear Fund of Knowledge: Good Thought Content: Normal Insight: Good Judgement: Good  Diagnosis: Bipolar disorder NOS, currently stable  Treatment Plan: We will continue his Risperdal at one half milligram daily at bedtime. When he returns we will consider reducing that further to one quarter milligram at bedtime.  WATT,ALAN, PA-C

## 2012-07-23 ENCOUNTER — Ambulatory Visit (INDEPENDENT_AMBULATORY_CARE_PROVIDER_SITE_OTHER): Payer: BC Managed Care – PPO | Admitting: *Deleted

## 2012-07-23 DIAGNOSIS — F319 Bipolar disorder, unspecified: Secondary | ICD-10-CM

## 2012-07-23 MED ORDER — RISPERIDONE MICROSPHERES 50 MG IM SUSR
50.0000 mg | INTRAMUSCULAR | Status: DC
  Administered 2012-07-23: 50 mg via INTRAMUSCULAR

## 2012-08-06 ENCOUNTER — Ambulatory Visit (INDEPENDENT_AMBULATORY_CARE_PROVIDER_SITE_OTHER): Payer: BC Managed Care – PPO | Admitting: *Deleted

## 2012-08-06 DIAGNOSIS — F319 Bipolar disorder, unspecified: Secondary | ICD-10-CM

## 2012-08-06 MED ORDER — RISPERIDONE MICROSPHERES 50 MG IM SUSR
50.0000 mg | INTRAMUSCULAR | Status: DC
  Administered 2012-08-06: 50 mg via INTRAMUSCULAR

## 2012-08-20 ENCOUNTER — Ambulatory Visit (INDEPENDENT_AMBULATORY_CARE_PROVIDER_SITE_OTHER): Payer: BC Managed Care – PPO | Admitting: *Deleted

## 2012-08-20 VITALS — BP 113/71 | HR 83 | Wt 234.2 lb

## 2012-08-20 DIAGNOSIS — F319 Bipolar disorder, unspecified: Secondary | ICD-10-CM

## 2012-08-20 MED ORDER — RISPERIDONE MICROSPHERES 50 MG IM SUSR
50.0000 mg | INTRAMUSCULAR | Status: DC
  Administered 2012-08-20: 50 mg via INTRAMUSCULAR

## 2012-08-26 ENCOUNTER — Other Ambulatory Visit (HOSPITAL_COMMUNITY): Payer: Self-pay | Admitting: Physician Assistant

## 2012-09-03 ENCOUNTER — Ambulatory Visit (HOSPITAL_COMMUNITY): Payer: Self-pay | Admitting: *Deleted

## 2012-09-04 ENCOUNTER — Ambulatory Visit (INDEPENDENT_AMBULATORY_CARE_PROVIDER_SITE_OTHER): Payer: BC Managed Care – PPO | Admitting: *Deleted

## 2012-09-04 VITALS — BP 111/79 | HR 77 | Ht 71.0 in | Wt 235.0 lb

## 2012-09-04 DIAGNOSIS — F319 Bipolar disorder, unspecified: Secondary | ICD-10-CM

## 2012-09-04 MED ORDER — RISPERIDONE MICROSPHERES 50 MG IM SUSR
50.0000 mg | INTRAMUSCULAR | Status: DC
  Administered 2012-09-04: 50 mg via INTRAMUSCULAR

## 2012-09-10 ENCOUNTER — Ambulatory Visit (HOSPITAL_COMMUNITY): Payer: Self-pay | Admitting: Physician Assistant

## 2012-09-11 ENCOUNTER — Other Ambulatory Visit (HOSPITAL_COMMUNITY): Payer: Self-pay | Admitting: Physician Assistant

## 2012-09-17 ENCOUNTER — Ambulatory Visit (INDEPENDENT_AMBULATORY_CARE_PROVIDER_SITE_OTHER): Payer: BC Managed Care – PPO | Admitting: *Deleted

## 2012-09-17 VITALS — BP 124/72 | HR 77 | Wt 236.6 lb

## 2012-09-17 DIAGNOSIS — F319 Bipolar disorder, unspecified: Secondary | ICD-10-CM

## 2012-09-17 MED ORDER — RISPERIDONE MICROSPHERES 50 MG IM SUSR
50.0000 mg | INTRAMUSCULAR | Status: DC
  Administered 2012-09-17: 50 mg via INTRAMUSCULAR

## 2012-09-24 ENCOUNTER — Other Ambulatory Visit (HOSPITAL_COMMUNITY): Payer: Self-pay | Admitting: Physician Assistant

## 2012-10-01 ENCOUNTER — Ambulatory Visit (INDEPENDENT_AMBULATORY_CARE_PROVIDER_SITE_OTHER): Payer: BC Managed Care – PPO | Admitting: *Deleted

## 2012-10-01 VITALS — BP 116/70 | HR 82 | Ht 71.0 in | Wt 239.0 lb

## 2012-10-01 DIAGNOSIS — F319 Bipolar disorder, unspecified: Secondary | ICD-10-CM

## 2012-10-01 MED ORDER — RISPERIDONE MICROSPHERES 50 MG IM SUSR
50.0000 mg | INTRAMUSCULAR | Status: DC
  Administered 2012-10-01: 50 mg via INTRAMUSCULAR

## 2012-10-15 ENCOUNTER — Ambulatory Visit (INDEPENDENT_AMBULATORY_CARE_PROVIDER_SITE_OTHER): Payer: BC Managed Care – PPO | Admitting: *Deleted

## 2012-10-15 VITALS — BP 127/71 | HR 85 | Wt 238.0 lb

## 2012-10-15 DIAGNOSIS — F319 Bipolar disorder, unspecified: Secondary | ICD-10-CM

## 2012-10-15 MED ORDER — RISPERIDONE MICROSPHERES 50 MG IM SUSR
50.0000 mg | INTRAMUSCULAR | Status: DC
  Administered 2012-10-15: 50 mg via INTRAMUSCULAR

## 2012-10-22 ENCOUNTER — Ambulatory Visit (INDEPENDENT_AMBULATORY_CARE_PROVIDER_SITE_OTHER): Payer: BC Managed Care – PPO | Admitting: Physician Assistant

## 2012-10-22 VITALS — BP 127/80 | HR 92 | Ht 71.0 in | Wt 238.0 lb

## 2012-10-22 DIAGNOSIS — F319 Bipolar disorder, unspecified: Secondary | ICD-10-CM

## 2012-10-24 NOTE — Progress Notes (Addendum)
[facility]al [facility] [address] Progress Note  Angel [name] [id number] 57 y.o.  10/22/2012 4:19 PM  Chief Complaint: Followup visit for bipolar disorder  History of Present Illness:Angel [name] presents today with his partner Angel [name] to followup on his treatment for bipolar disorder. He reports that he is doing "pretty good." He states that he is a little tired, and his sleep has increased. He typically does not go to sleep until midnight or 2 AM, and then sleeps until 1:30 or 2:00 in the afternoon. His partner reports that They plan to move next week, and moving always creates stress for Angel [name]. He has not lost any further weight, but has lost a total of about 30 pounds. He denies any suicidal or homicidal ideation. He denies any auditory or visual hallucinations.  Suicidal Ideation: No Plan Formed: No Patient has means to carry out plan: No  Homicidal Ideation: No Plan Formed: No Patient has means to carry out plan: No  Review of Systems: Psychiatric: Agitation: No Hallucination: No Depressed Mood: No Insomnia: No Hypersomnia: Yes Altered Concentration: No Feels Worthless: No Grandiose Ideas: No Belief In Special Powers: No New/Increased Substance Abuse: No Compulsions: No  Neurologic: Headache: No Seizure: No Paresthesias: No  Past Medical History: Hepatitis B carrier, obesity   Outpatient Encounter Prescriptions as of 10/22/2012  Medication Sig Dispense Refill  . clonazePAM (KLONOPIN) 1 MG tablet TAKE 1 TABLET BY MOUTH TWICE A DAY  60 tablet  2  . lithium carbonate 300 MG capsule TAKE 2 CAPSULES BY MOUTH TWICE A DAY  120 capsule  2  . RISPERDAL CONSTA 50 MG injection INJECT INTO THE MUSCLE EVERY 14 DAYS  2 each  3  . risperiDONE (RISPERDAL) 1 MG tablet Take 0.5 tablets (0.5 mg total) by mouth at bedtime. May take whole tablet if needed.  30 tablet  1  . venlafaxine XR (EFFEXOR-XR) 150 MG 24 hr capsule TAKE 3 CAPSULES BY MOUTH EVERY DAY  90 capsule  2   No  facility-administered encounter medications on file as of 10/22/2012.    Past Psychiatric History/Hospitalization(s): Anxiety: Yes Bipolar Disorder: Yes Depression: Yes Mania: Yes Psychosis: No Schizophrenia: No Personality Disorder: No Hospitalization for psychiatric illness: Yes History of Electroconvulsive Shock Therapy: No Prior Suicide Attempts: No  Physical Exam: Constitutional:  BP 127/80  Pulse 92  Ht 5\' 11"  (1.803 m)  Wt 238 lb (107.956 kg)  BMI 33.21 kg/m2  General Appearance: alert, oriented, no acute distress, well nourished, obese and casually dressed  Musculoskeletal: Strength & Muscle Tone: within normal limits Gait & Station: normal Patient leans: N/A  Psychiatric: Speech (describe rate, volume, coherence, spontaneity, and abnormalities if any): Clear and coherent with irregular rate and rhythm and normal volume  Thought Process (describe rate, content, abstract reasoning, and computation): Within normal limits  Associations: Intact  Thoughts: normal  Mental Status: Orientation: oriented to person, place, time/date and situation Mood & Affect: depressed affect Attention Span & Concentration: Intact  Medical Decision Making (Choose Three): Established Problem, Stable/Improving (1), Review of Psycho-Social Stressors (1) and Review of Medication Regimen & Side Effects (2)  Assessment: Axis I: Bipolar disorder not otherwise specified, currently stable  Axis II: Deferred  Axis III: Hepatitis B carrier, obesity  Axis IV: Moderate  Axis V: 65   Plan: We will continue his Risperdal at one half milligram at bedtime, but at his next visit we will consider the possibility of tapering it further with the intention of discontinuing the oral Risperdal all together. We will  continue his Risperdal Consta injections 50 mg every 2 weeks, Klonopin 1 mg twice daily, Effexor XR 450 mg daily, and lithium 600 mg twice daily. He'll return for followup in 2  months.  Attilio Zeitler, PA-C 10/24/2012

## 2012-10-29 ENCOUNTER — Ambulatory Visit (HOSPITAL_COMMUNITY): Payer: Self-pay | Admitting: *Deleted

## 2012-10-30 ENCOUNTER — Ambulatory Visit (INDEPENDENT_AMBULATORY_CARE_PROVIDER_SITE_OTHER): Payer: BC Managed Care – PPO | Admitting: *Deleted

## 2012-10-30 DIAGNOSIS — F319 Bipolar disorder, unspecified: Secondary | ICD-10-CM

## 2012-10-30 MED ORDER — RISPERIDONE MICROSPHERES 50 MG IM SUSR
50.0000 mg | INTRAMUSCULAR | Status: DC
  Administered 2012-10-30: 50 mg via INTRAMUSCULAR

## 2012-11-04 ENCOUNTER — Other Ambulatory Visit (HOSPITAL_COMMUNITY): Payer: Self-pay | Admitting: Physician Assistant

## 2012-11-12 ENCOUNTER — Ambulatory Visit (INDEPENDENT_AMBULATORY_CARE_PROVIDER_SITE_OTHER): Payer: BC Managed Care – PPO | Admitting: Physician Assistant

## 2012-11-12 VITALS — BP 127/77 | HR 81 | Ht 71.0 in | Wt 241.6 lb

## 2012-11-12 DIAGNOSIS — F319 Bipolar disorder, unspecified: Secondary | ICD-10-CM

## 2012-11-12 MED ORDER — RISPERIDONE MICROSPHERES 50 MG IM SUSR
50.0000 mg | INTRAMUSCULAR | Status: DC
  Administered 2012-11-12: 50 mg via INTRAMUSCULAR

## 2012-11-26 ENCOUNTER — Ambulatory Visit (INDEPENDENT_AMBULATORY_CARE_PROVIDER_SITE_OTHER): Payer: BC Managed Care – PPO | Admitting: *Deleted

## 2012-11-26 VITALS — BP 118/78 | HR 88 | Ht 68.5 in | Wt 245.2 lb

## 2012-11-26 DIAGNOSIS — F319 Bipolar disorder, unspecified: Secondary | ICD-10-CM

## 2012-11-26 MED ORDER — RISPERIDONE MICROSPHERES 50 MG IM SUSR
50.0000 mg | INTRAMUSCULAR | Status: DC
  Administered 2012-11-26: 50 mg via INTRAMUSCULAR

## 2012-12-10 ENCOUNTER — Ambulatory Visit (INDEPENDENT_AMBULATORY_CARE_PROVIDER_SITE_OTHER): Payer: BC Managed Care – PPO | Admitting: *Deleted

## 2012-12-10 VITALS — BP 120/76 | HR 89 | Ht 71.0 in | Wt 245.0 lb

## 2012-12-10 DIAGNOSIS — F319 Bipolar disorder, unspecified: Secondary | ICD-10-CM

## 2012-12-10 MED ORDER — RISPERIDONE MICROSPHERES 50 MG IM SUSR
50.0000 mg | INTRAMUSCULAR | Status: DC
  Administered 2012-12-10: 50 mg via INTRAMUSCULAR

## 2012-12-24 ENCOUNTER — Ambulatory Visit (INDEPENDENT_AMBULATORY_CARE_PROVIDER_SITE_OTHER): Payer: BC Managed Care – PPO | Admitting: *Deleted

## 2012-12-24 VITALS — BP 145/73 | HR 85 | Ht 71.0 in | Wt 248.0 lb

## 2012-12-24 DIAGNOSIS — F319 Bipolar disorder, unspecified: Secondary | ICD-10-CM

## 2012-12-24 MED ORDER — RISPERIDONE MICROSPHERES 50 MG IM SUSR
50.0000 mg | INTRAMUSCULAR | Status: DC
  Administered 2012-12-24: 50 mg via INTRAMUSCULAR

## 2012-12-29 ENCOUNTER — Other Ambulatory Visit (HOSPITAL_COMMUNITY): Payer: Self-pay | Admitting: Physician Assistant

## 2012-12-29 DIAGNOSIS — Z79899 Other long term (current) drug therapy: Secondary | ICD-10-CM

## 2012-12-30 ENCOUNTER — Ambulatory Visit (INDEPENDENT_AMBULATORY_CARE_PROVIDER_SITE_OTHER): Payer: BC Managed Care – PPO | Admitting: Physician Assistant

## 2012-12-30 VITALS — BP 130/75 | HR 89 | Ht 71.0 in | Wt 247.8 lb

## 2012-12-30 DIAGNOSIS — Z79899 Other long term (current) drug therapy: Secondary | ICD-10-CM

## 2012-12-30 DIAGNOSIS — F319 Bipolar disorder, unspecified: Secondary | ICD-10-CM

## 2012-12-30 MED ORDER — RISPERIDONE MICROSPHERES 50 MG IM SUSR: INTRAMUSCULAR | Status: DC

## 2012-12-30 MED ORDER — VENLAFAXINE HCL ER 150 MG PO CP24: ORAL_CAPSULE | ORAL | Status: DC

## 2012-12-31 ENCOUNTER — Encounter (HOSPITAL_COMMUNITY): Payer: Self-pay | Admitting: *Deleted

## 2012-12-31 NOTE — Progress Notes (Signed)
BCBs authorized Effexor XR 450 mg - required due to quantity. Representative Maura advised new PA will be needed April 2015. Rep will notify pharmacy - [facility] Pt notified by phone-message left

## 2013-01-01 NOTE — Progress Notes (Signed)
[facility] [id number] Progress Note  Angel [name] [id number] 57 y.o.  12/30/2012 2:33 PM  Chief Complaint: Followup visit for bipolar  History of Present Illness: Taimur presents today with his partner to followup on his treatment for bipolar disorder. He reports that he is doing very well, and his partner [name] agrees. He does complain of excessive sleepiness. He feels that his mood has been stable, and he denies any depression or symptoms of mania. His anxiety is well managed. He denies any suicidal or homicidal ideation. He denies any auditory or visual hallucinations. His appetite is good, but he has regained approximately 10 pounds.  Suicidal Ideation: No Plan Formed: No Patient has means to carry out plan: No  Homicidal Ideation: No Plan Formed: No Patient has means to carry out plan: No  Review of Systems: Psychiatric: Agitation: No Hallucination: No Depressed Mood: No Insomnia: No Hypersomnia: Yes Altered Concentration: No Feels Worthless: No Grandiose Ideas: No Belief In Special Powers: No New/Increased Substance Abuse: No Compulsions: No  Neurologic: Headache: No Seizure: No Paresthesias: No  Past Medical History: Hepatitis B carrier, obesity   Outpatient Encounter Prescriptions as of 12/30/2012  Medication Sig Dispense Refill  . clonazePAM (KLONOPIN) 1 MG tablet TAKE 1 TABLET BY MOUTH TWICE A DAY  60 tablet  2  . lithium carbonate 300 MG capsule TAKE 2 CAPSULES BY MOUTH TWICE A DAY  120 capsule  2  . risperiDONE (RISPERDAL) 1 MG tablet TAKE 0.5 TABLETS (0.5 MG TOTAL) BY MOUTH AT BEDTIME. MAY TAKE WHOLE TABLET IF NEEDED.  30 tablet  1  . [DISCONTINUED] RISPERDAL CONSTA 50 MG injection INJECT INTO THE MUSCLE EVERY 14 DAYS  2 each  3  . [DISCONTINUED] venlafaxine XR (EFFEXOR-XR) 150 MG 24 hr capsule TAKE 3 CAPSULES BY MOUTH EVERY DAY  90 capsule  2   No facility-administered encounter medications on file as of 12/30/2012.    Past Psychiatric  History/Hospitalization(s): Anxiety: Yes Bipolar Disorder: Yes Depression: Yes Mania: Yes Psychosis: No Schizophrenia: No Personality Disorder: No Hospitalization for psychiatric illness: Yes History of Electroconvulsive Shock Therapy: No Prior Suicide Attempts: No  Physical Exam: Constitutional:  BP 130/75  Pulse 89  Ht 5\' 11"  (1.803 m)  Wt 247 lb 12.8 oz (112.401 kg)  BMI 34.58 kg/m2  General Appearance: alert, oriented, no acute distress, well nourished and casually dressed  Musculoskeletal: Strength & Muscle Tone: within normal limits Gait & Station: normal Patient leans: N/A  Psychiatric: Speech (describe rate, volume, coherence, spontaneity, and abnormalities if any): Clear and coherent a regular rate and rhythm and normal volume  Thought Process (describe rate, content, abstract reasoning, and computation): Within normal limits  Associations: Intact  Thoughts: normal  Mental Status: Orientation: oriented to person, place, time/date and situation Mood & Affect: Dysphoric Attention Span & Concentration: Intact  Medical Decision Making (Choose Three): Established Problem, Stable/Improving (1), Review of Psycho-Social Stressors (1), Review of Medication Regimen & Side Effects (2) and Review of New Medication or Change in Dosage (2)  Assessment: Axis I: Bipolar disorder NOS  Axis II: Deferred  Axis III: Hepatitis B carrier, obesity  Axis IV: Mild  Axis V: 65-70   Plan: We will decrease his Risperdal to 0.25 mg at bedtime. We will continue his Risperdal Consta injections 50 mg every 2 weeks, Klonopin 1 mg twice daily, Effexor XR 450 mg daily, and lithium 600 mg twice daily. He will return for followup in 2 months.  Eiko Mcgowen, PA-C 01/01/2013

## 2013-01-07 ENCOUNTER — Ambulatory Visit (HOSPITAL_COMMUNITY): Payer: Self-pay | Admitting: *Deleted

## 2013-01-09 ENCOUNTER — Ambulatory Visit (HOSPITAL_COMMUNITY): Payer: Self-pay | Admitting: *Deleted

## 2013-01-09 ENCOUNTER — Telehealth (HOSPITAL_COMMUNITY): Payer: Self-pay | Admitting: *Deleted

## 2013-01-09 NOTE — Telephone Encounter (Signed)
Called pt after recv'd message earlier that pt could not get his Risperdal Consta.Pt states pharmacy having trouble obtaining med.Wanted to know if he should increase his oral dose of Risperidone.Advised him provider would contact him if he needed to.

## 2013-01-15 ENCOUNTER — Ambulatory Visit (INDEPENDENT_AMBULATORY_CARE_PROVIDER_SITE_OTHER): Payer: BC Managed Care – PPO | Admitting: *Deleted

## 2013-01-15 VITALS — BP 142/82 | HR 82 | Ht 71.0 in | Wt 245.5 lb

## 2013-01-15 DIAGNOSIS — F319 Bipolar disorder, unspecified: Secondary | ICD-10-CM

## 2013-01-15 MED ORDER — RISPERIDONE MICROSPHERES 50 MG IM SUSR
50.0000 mg | INTRAMUSCULAR | Status: DC
  Administered 2013-01-15: 50 mg via INTRAMUSCULAR

## 2013-01-15 NOTE — Progress Notes (Signed)
Patient in today for injection of Risperdal - Consta, ordered to be given every 2 weeks. Medication last given 12/24/12. Was scheduled for 01/09/13, but pharmacy did not have medication. Obtained medication today from pharmacy.  Patient states he has felt okay until yesterday.

## 2013-01-25 ENCOUNTER — Other Ambulatory Visit (HOSPITAL_COMMUNITY): Payer: Self-pay | Admitting: Physician Assistant

## 2013-01-28 ENCOUNTER — Ambulatory Visit (INDEPENDENT_AMBULATORY_CARE_PROVIDER_SITE_OTHER): Payer: BC Managed Care – PPO | Admitting: *Deleted

## 2013-01-28 ENCOUNTER — Ambulatory Visit (HOSPITAL_COMMUNITY): Payer: Self-pay | Admitting: *Deleted

## 2013-01-28 VITALS — BP 117/78 | HR 84 | Ht 71.0 in | Wt 248.4 lb

## 2013-01-28 DIAGNOSIS — F319 Bipolar disorder, unspecified: Secondary | ICD-10-CM

## 2013-01-28 MED ORDER — RISPERIDONE MICROSPHERES 50 MG IM SUSR
50.0000 mg | INTRAMUSCULAR | Status: DC
  Administered 2013-01-28: 50 mg via INTRAMUSCULAR

## 2013-02-11 ENCOUNTER — Ambulatory Visit (INDEPENDENT_AMBULATORY_CARE_PROVIDER_SITE_OTHER): Payer: BC Managed Care – PPO | Admitting: *Deleted

## 2013-02-11 VITALS — BP 112/73 | HR 81 | Ht 71.0 in | Wt 245.2 lb

## 2013-02-11 DIAGNOSIS — F319 Bipolar disorder, unspecified: Secondary | ICD-10-CM

## 2013-02-11 MED ORDER — RISPERIDONE MICROSPHERES 50 MG IM SUSR
50.0000 mg | INTRAMUSCULAR | Status: DC
  Administered 2013-02-11: 50 mg via INTRAMUSCULAR

## 2013-02-11 NOTE — Progress Notes (Signed)
Reminded pt to complete lab tests requested by provider. Provided reminder and education regarding Flu Vaccine.

## 2013-02-17 ENCOUNTER — Telehealth (HOSPITAL_COMMUNITY): Payer: Self-pay | Admitting: *Deleted

## 2013-02-17 NOTE — Telephone Encounter (Signed)
Pt called requesting a blood work form stating he has lost it. States Angel [name] gave it to him to get blood drawn. Would like a new one.

## 2013-02-19 ENCOUNTER — Other Ambulatory Visit (HOSPITAL_COMMUNITY): Payer: Self-pay | Admitting: Physician Assistant

## 2013-02-19 DIAGNOSIS — Z79899 Other long term (current) drug therapy: Secondary | ICD-10-CM

## 2013-02-19 DIAGNOSIS — F319 Bipolar disorder, unspecified: Secondary | ICD-10-CM

## 2013-02-25 ENCOUNTER — Ambulatory Visit (INDEPENDENT_AMBULATORY_CARE_PROVIDER_SITE_OTHER): Payer: BC Managed Care – PPO | Admitting: *Deleted

## 2013-02-25 VITALS — BP 133/74 | HR 84 | Ht 71.0 in | Wt 249.0 lb

## 2013-02-25 DIAGNOSIS — F319 Bipolar disorder, unspecified: Secondary | ICD-10-CM

## 2013-02-25 MED ORDER — RISPERIDONE MICROSPHERES 50 MG IM SUSR
50.0000 mg | INTRAMUSCULAR | Status: DC
  Administered 2013-02-25: 50 mg via INTRAMUSCULAR

## 2013-03-04 ENCOUNTER — Ambulatory Visit (INDEPENDENT_AMBULATORY_CARE_PROVIDER_SITE_OTHER): Payer: BC Managed Care – PPO | Admitting: Physician Assistant

## 2013-03-04 DIAGNOSIS — F319 Bipolar disorder, unspecified: Secondary | ICD-10-CM

## 2013-03-05 ENCOUNTER — Encounter: Payer: Self-pay | Admitting: Gastroenterology

## 2013-03-11 ENCOUNTER — Ambulatory Visit (INDEPENDENT_AMBULATORY_CARE_PROVIDER_SITE_OTHER): Payer: BC Managed Care – PPO | Admitting: *Deleted

## 2013-03-11 VITALS — BP 126/75 | HR 92 | Ht 71.0 in | Wt 244.0 lb

## 2013-03-11 DIAGNOSIS — F319 Bipolar disorder, unspecified: Secondary | ICD-10-CM

## 2013-03-11 MED ORDER — RISPERIDONE MICROSPHERES 50 MG IM SUSR
50.0000 mg | INTRAMUSCULAR | Status: DC
  Administered 2013-03-11: 50 mg via INTRAMUSCULAR

## 2013-03-14 ENCOUNTER — Ambulatory Visit: Payer: Self-pay | Admitting: Gastroenterology

## 2013-03-17 ENCOUNTER — Other Ambulatory Visit (HOSPITAL_COMMUNITY): Payer: Self-pay | Admitting: Physician Assistant

## 2013-03-17 DIAGNOSIS — F319 Bipolar disorder, unspecified: Secondary | ICD-10-CM

## 2013-03-18 NOTE — Progress Notes (Signed)
   [facility] Follow-up Outpatient Visit  Damyon Mullane [birth year]  Date: 10/28.2014   Subjective: [place] presents today to follow up on his treatment for bipolar disorder.  At his last appointment approximately 2 months ago we decreased his oral Risperdal to 0.25 mg.  He denies any change in mood.  He endorses good sleep and appetite.  He denies any SI/HI or AVH.  He feels he is okay to with the plan to stop the oral Risperdal.    There were no vitals filed for this visit.  Mental Status Examination  Appearance: Casual Alert: Yes Attention: good  Cooperative: Yes Eye Contact: Good Speech: Clear and coherent Psychomotor Activity: Normal Memory/Concentration: Intact Oriented: person, place, time/date and situation Mood: Dysphoric Affect: Congruent Thought Processes and Associations: Linear Fund of Knowledge: Fair Thought Content: Normal Insight: Fair Judgement: Fair  Diagnosis: Bipolar disorder, NOS  Treatment Plan: We will discontinue the oral Risperdal and continue his Risperdal Consta injections 50 mg every 2 weeks, Klonopin 1 mg twice daily, Effexor XR 450 mg daily, and lithium 600 mg twice daily. He will return for followup in 2 months with Dr. Donell Beers.  He is instructed to be vigilant for signs of mania, including decreased sleep and excessive spending behavior. If he experiences these, he is instructed to resume the oral Risperdal.    Shawndra Clute, PA-C

## 2013-03-19 NOTE — Telephone Encounter (Signed)
Chart reviewed with Dr.Plovsky. Refill appropriate

## 2013-03-21 ENCOUNTER — Encounter: Payer: Self-pay | Admitting: Gastroenterology

## 2013-03-21 ENCOUNTER — Ambulatory Visit (INDEPENDENT_AMBULATORY_CARE_PROVIDER_SITE_OTHER): Payer: BC Managed Care – PPO | Admitting: Gastroenterology

## 2013-03-21 VITALS — BP 130/70 | HR 80 | Ht 69.0 in | Wt 257.0 lb

## 2013-03-21 DIAGNOSIS — Z860100 Personal history of colon polyps, unspecified: Secondary | ICD-10-CM | POA: Insufficient documentation

## 2013-03-21 DIAGNOSIS — R7989 Other specified abnormal findings of blood chemistry: Secondary | ICD-10-CM

## 2013-03-21 DIAGNOSIS — B1910 Unspecified viral hepatitis B without hepatic coma: Secondary | ICD-10-CM

## 2013-03-21 DIAGNOSIS — K219 Gastro-esophageal reflux disease without esophagitis: Secondary | ICD-10-CM

## 2013-03-21 MED ORDER — PANTOPRAZOLE SODIUM 40 MG PO TBEC: 40.0000 mg | DELAYED_RELEASE_TABLET | Freq: Every day | ORAL | Status: DC

## 2013-03-21 MED ORDER — PNEUMOCOCCAL VAC POLYVALENT 25 MCG/0.5ML IJ INJ: 0.5000 mL | INJECTION | INTRAMUSCULAR | Status: DC

## 2013-03-21 NOTE — Assessment & Plan Note (Signed)
The patient has symptomatically reflux despite ranitidine.  Recommendations #1 begin Protonix 40 mg daily

## 2013-03-21 NOTE — Progress Notes (Signed)
History of Present Illness: 57 year old white male with bipolar disorder, hepatitis B,  referred for evaluation of abnormal liver tests.  He's been treated for hepatitis B in the past while living in [place].  Blood work from last month was pertinent for an open 13.8 platelet count 85,000 albumin 2.9, alkaline phosphatase 128, AST 83 ALT 92.  The patient has no history of hepatitis or blood transfusions.  He claims that he received a series of injections in the 80s and had a contaminated needle.  His main complaint is frequent pyrosis.  She takes ranitidine.  He denies dysphagia.  He also has a history of colon polyps.  Last colonoscopy 3 years ago apparently was normal.    Past Medical History  Diagnosis Date  . Hepatitis B carrier   . Depression   . Bipolar disorder    Past Surgical History  Procedure Laterality Date  . Appendectomy     family history includes Diabetes in his father; Heart disease in his mother; Kidney failure in his father; Other in his sister. Current Outpatient Prescriptions  Medication Sig Dispense Refill  . clonazePAM (KLONOPIN) 1 MG tablet TAKE 1 TABLET BY MOUTH TWICE DAILY  60 tablet  1  . lithium carbonate 300 MG capsule TAKE 2 CAPSULES BY MOUTH TWICE A DAY  120 capsule  2  . risperiDONE microspheres (RISPERDAL CONSTA) 50 MG injection INJECT INTO THE MUSCLE EVERY 14 DAYS  2 each  3  . venlafaxine XR (EFFEXOR-XR) 150 MG 24 hr capsule TAKE 3 CAPSULES BY MOUTH EVERY DAY  90 capsule  2   No current facility-administered medications for this visit.   Allergies as of 03/21/2013 - Review Complete 03/21/2013  Allergen Reaction Noted  . Aripiprazole Other (See Comments) 03/15/2011  . Abilify [aripiprazole]  03/21/2013  . Lamictal [lamotrigine] Rash 03/15/2011  . Lexapro [escitalopram oxalate] Other (See Comments) 03/15/2011    reports that he quit smoking about 3 years ago. His smoking use included Cigarettes. He smoked 0.00 packs per day. He has never used  smokeless tobacco. He reports that he does not drink alcohol or use illicit drugs.     Review of Systems: Pertinent positive and negative review of systems were noted in the above HPI section. All other review of systems were otherwise negative.  Vital signs were reviewed in today's medical record Physical Exam: General: Well developed , well nourished, no acute distress Skin: anicteric Head: Normocephalic and atraumatic Eyes:  sclerae anicteric, EOMI Ears: Normal auditory acuity Mouth: No deformity or lesions Neck: Supple, no masses or thyromegaly Lungs: Clear throughout to auscultation Heart: Regular rate and rhythm; no murmurs, rubs or bruits Abdomen: Soft, non tender and non distended. No masses, hepatosplenomegaly or hernias noted. Normal Bowel sounds Rectal:deferred Musculoskeletal: Symmetrical with no gross deformities  Skin: No lesions on visible extremities Pulses:  Normal pulses noted Extremities: No clubbing, cyanosis, edema or deformities noted Neurological: Alert oriented x 4, grossly nonfocal Cervical Nodes:  No significant cervical adenopathy Inguinal Nodes: No significant inguinal adenopathy Psychological:  Alert and cooperative. Normal mood and affect

## 2013-03-21 NOTE — Assessment & Plan Note (Signed)
Patient has history of hepatitis B and apparently has been treated in the past.  Abnormal liver tests may reflect ongoing infection.  In addition, thrombocytopenia and hypoalbuminemia raise the question of cirrhosis.  Recommendations #1 review prior medical records from [place] #2 abdominal ultrasound

## 2013-03-21 NOTE — Patient Instructions (Signed)
You have been scheduled for an abdominal ultrasound at [facility] (1st floor of hospital) on Monday 17th at 9:30am. Please arrive 15 minutes prior to your appointment for registration. Make certain not to have anything to eat or drink 6 hours prior to your appointment. Should you need to reschedule your appointment, please contact radiology at [phone]. This test typically takes about 30 minutes to perform.  We have scheduled you for an appointment with Primary Care Dr Bayard Hugger on June 2nd 2015  We are giving you a Flu Vaccine today We will obtain records from Dr Earline Mayotte

## 2013-03-21 NOTE — Assessment & Plan Note (Signed)
History of polyps.  Last colonoscopy, by report, 3 years ago was negative  Recommendations #1 obtain prior records

## 2013-03-24 ENCOUNTER — Ambulatory Visit (HOSPITAL_COMMUNITY)
Admission: RE | Admit: 2013-03-24 | Discharge: 2013-03-24 | Disposition: A | Discharge status: Home / Self Care | Payer: BC Managed Care – PPO | Source: Ambulatory Visit | Attending: Gastroenterology | Admitting: Gastroenterology

## 2013-03-24 DIAGNOSIS — N289 Disorder of kidney and ureter, unspecified: Secondary | ICD-10-CM | POA: Insufficient documentation

## 2013-03-24 DIAGNOSIS — R7989 Other specified abnormal findings of blood chemistry: Secondary | ICD-10-CM

## 2013-03-24 DIAGNOSIS — K7689 Other specified diseases of liver: Secondary | ICD-10-CM | POA: Insufficient documentation

## 2013-03-25 ENCOUNTER — Ambulatory Visit (INDEPENDENT_AMBULATORY_CARE_PROVIDER_SITE_OTHER): Payer: BC Managed Care – PPO | Admitting: *Deleted

## 2013-03-25 VITALS — BP 132/76 | HR 89 | Ht 71.0 in | Wt 250.0 lb

## 2013-03-25 DIAGNOSIS — F319 Bipolar disorder, unspecified: Secondary | ICD-10-CM

## 2013-03-25 MED ORDER — RISPERIDONE MICROSPHERES 50 MG IM SUSR
50.0000 mg | INTRAMUSCULAR | Status: DC
  Administered 2013-03-25: 50 mg via INTRAMUSCULAR

## 2013-04-01 ENCOUNTER — Other Ambulatory Visit (HOSPITAL_COMMUNITY): Payer: Self-pay | Admitting: Physician Assistant

## 2013-04-01 DIAGNOSIS — F319 Bipolar disorder, unspecified: Secondary | ICD-10-CM

## 2013-04-01 NOTE — Telephone Encounter (Signed)
Chart reviewed - Refill Appropriate Appt with Dr. Donell Beers 05/21/13

## 2013-04-02 ENCOUNTER — Encounter: Payer: Self-pay | Admitting: Gastroenterology

## 2013-04-02 DIAGNOSIS — Z86010 Personal history of colonic polyps: Secondary | ICD-10-CM

## 2013-04-02 DIAGNOSIS — B1910 Unspecified viral hepatitis B without hepatic coma: Secondary | ICD-10-CM

## 2013-04-02 NOTE — Assessment & Plan Note (Signed)
Check hepatitis B viral DNA and hepatitis B antigen

## 2013-04-02 NOTE — Progress Notes (Unsigned)
Patient ID: Angel [name], male   DOB: [birth year], 57 y.o.   MRN: [id number] Old records were reviewed.  Patient has hepatitis B having tested HB E. antigen positive and has been treated with Hepsera and Baraclude. He has a history of colon polyps.  An adenomatous polyp was removed in 2010.  Assessment/plan #1 check HBV DNA #2 colonoscopy 2015

## 2013-04-02 NOTE — Assessment & Plan Note (Signed)
Plan followup colonoscopy 2015 

## 2013-04-07 ENCOUNTER — Other Ambulatory Visit: Payer: Self-pay

## 2013-04-07 DIAGNOSIS — K7460 Unspecified cirrhosis of liver: Secondary | ICD-10-CM

## 2013-04-08 ENCOUNTER — Other Ambulatory Visit: Payer: BC Managed Care – PPO

## 2013-04-08 ENCOUNTER — Ambulatory Visit (INDEPENDENT_AMBULATORY_CARE_PROVIDER_SITE_OTHER): Payer: BC Managed Care – PPO | Admitting: *Deleted

## 2013-04-08 VITALS — BP 124/70 | HR 80 | Ht 71.0 in | Wt 254.2 lb

## 2013-04-08 DIAGNOSIS — K7460 Unspecified cirrhosis of liver: Secondary | ICD-10-CM

## 2013-04-08 DIAGNOSIS — F319 Bipolar disorder, unspecified: Secondary | ICD-10-CM

## 2013-04-08 MED ORDER — RISPERIDONE MICROSPHERES 50 MG IM SUSR: 50.0000 mg | INTRAMUSCULAR | Status: DC

## 2013-04-08 NOTE — Progress Notes (Signed)
Risperdal Consta 50 mg injected into Rt upper outer quadrant  Lot # [id number], pt supplied Documented in progress note, no MAR abailable

## 2013-04-09 ENCOUNTER — Telehealth: Payer: Self-pay | Admitting: *Deleted

## 2013-04-09 NOTE — Telephone Encounter (Signed)
They are on your desk. I put them on there today

## 2013-04-09 NOTE — Telephone Encounter (Signed)
Message copied by Marlowe Kays on Wed Apr 09, 2013  2:19 PM ------      Message from: Melvia Heaps D      Created: Mon Mar 24, 2013 11:59 AM       Just a reminder that I need this patient's medical records from [place] ------

## 2013-04-10 LAB — HEPATITIS B DNA, ULTRAQUANTITATIVE, PCR
Hepatitis B DNA (Calc): 95763816 copies/mL — ABNORMAL HIGH (ref <116)
Hepatitis B DNA: 16454264 IU/mL — ABNORMAL HIGH (ref <20)

## 2013-04-22 ENCOUNTER — Ambulatory Visit (INDEPENDENT_AMBULATORY_CARE_PROVIDER_SITE_OTHER): Payer: BC Managed Care – PPO | Admitting: *Deleted

## 2013-04-22 VITALS — BP 126/74 | HR 90

## 2013-04-22 DIAGNOSIS — F319 Bipolar disorder, unspecified: Secondary | ICD-10-CM

## 2013-04-22 MED ORDER — RISPERIDONE MICROSPHERES 50 MG IM SUSR: 50.0000 mg | INTRAMUSCULAR | Status: DC

## 2013-04-22 NOTE — Progress Notes (Signed)
Risperdal Consta  50 mg injected Left Upper Outer Quadrant Lot # [id number], pt supplied medication. No MAR available.Documented in Progress Note

## 2013-05-01 ENCOUNTER — Other Ambulatory Visit (HOSPITAL_COMMUNITY): Payer: Self-pay | Admitting: Physician Assistant

## 2013-05-01 DIAGNOSIS — F319 Bipolar disorder, unspecified: Secondary | ICD-10-CM

## 2013-05-05 NOTE — Telephone Encounter (Signed)
Chart reviewed. Refill appropriate. 

## 2013-05-06 ENCOUNTER — Ambulatory Visit (HOSPITAL_COMMUNITY): Payer: Self-pay | Admitting: *Deleted

## 2013-05-06 ENCOUNTER — Telehealth: Payer: Self-pay | Admitting: Gastroenterology

## 2013-05-07 ENCOUNTER — Ambulatory Visit (INDEPENDENT_AMBULATORY_CARE_PROVIDER_SITE_OTHER): Payer: BC Managed Care – PPO | Admitting: *Deleted

## 2013-05-07 VITALS — BP 127/83 | HR 100 | Ht 71.0 in | Wt 251.6 lb

## 2013-05-07 DIAGNOSIS — F319 Bipolar disorder, unspecified: Secondary | ICD-10-CM

## 2013-05-07 MED ORDER — RISPERIDONE MICROSPHERES 50 MG IM SUSR: 50.0000 mg | INTRAMUSCULAR | Status: DC

## 2013-05-07 NOTE — Telephone Encounter (Signed)
Left message for patient to call back  

## 2013-05-07 NOTE — Progress Notes (Signed)
Risperdal Consta 50 mg given IM-- Right upper outer Quadrant Lot # [id number], patient supplied medication MAR not available for charting

## 2013-05-09 NOTE — Telephone Encounter (Signed)
Left message for patient to call back  

## 2013-05-12 NOTE — Telephone Encounter (Signed)
Spoke with pt and let him know referral info has been sent to [facility] Dr. Monica Martinez and he should be hearing from their clinic regarding an appt.

## 2013-05-16 ENCOUNTER — Telehealth: Payer: Self-pay | Admitting: Gastroenterology

## 2013-05-16 NOTE — Telephone Encounter (Signed)
Called pt and [facility] regarding referral. [facility] states that will call him today regarding the appt. Pt aware.

## 2013-05-20 ENCOUNTER — Ambulatory Visit (INDEPENDENT_AMBULATORY_CARE_PROVIDER_SITE_OTHER): Payer: BC Managed Care – PPO | Admitting: *Deleted

## 2013-05-20 VITALS — BP 121/70 | HR 90 | Ht 71.0 in | Wt 252.0 lb

## 2013-05-20 DIAGNOSIS — F319 Bipolar disorder, unspecified: Secondary | ICD-10-CM

## 2013-05-20 MED ORDER — RISPERIDONE MICROSPHERES 50 MG IM SUSR: 50.0000 mg | INTRAMUSCULAR | Status: DC

## 2013-05-20 NOTE — Progress Notes (Signed)
Risperdal Consta 50 mg  IM Right Upper Outer Quadrant  Lot# [id number]    patient supplied medication  MAR not available for charting

## 2013-05-21 ENCOUNTER — Ambulatory Visit (INDEPENDENT_AMBULATORY_CARE_PROVIDER_SITE_OTHER): Payer: BC Managed Care – PPO | Admitting: Psychiatry

## 2013-05-21 VITALS — BP 140/74 | HR 105 | Ht 71.0 in | Wt 252.0 lb

## 2013-05-21 DIAGNOSIS — F3130 Bipolar disorder, current episode depressed, mild or moderate severity, unspecified: Secondary | ICD-10-CM | POA: Insufficient documentation

## 2013-05-21 DIAGNOSIS — F319 Bipolar disorder, unspecified: Secondary | ICD-10-CM

## 2013-05-21 MED ORDER — VENLAFAXINE HCL ER 150 MG PO CP24: ORAL_CAPSULE | ORAL | Status: DC

## 2013-05-21 MED ORDER — LITHIUM CARBONATE 300 MG PO CAPS: ORAL_CAPSULE | ORAL | Status: DC

## 2013-05-21 MED ORDER — CLONAZEPAM 1 MG PO TABS: ORAL_TABLET | ORAL | Status: DC

## 2013-05-21 NOTE — Progress Notes (Signed)
[facility] MD Progress Note  05/21/2013 2:09 PM Angel [name]  MRN:  [id number] Subjective:  Feeling good This patient is a 58 year old white male who being treated for bipolar disorder. The patient is on disability for bipolar. The patient is in a good stable relationship at this time. He has no children. 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 Today the patient is doing very well. He denies depression. He denies euphoria. He is sleeping and eating well. He's got good energy. The patient denies problems concentrating is a good sense of worth. He denies being suicidal now or ever. He enjoys being on the computer reading and watching TV. The patient denies the use of alcohol or drugs. He denies ever having psychosis. Presently takes multiple psychotropic medications. It should be noted that 3 years ago he did have a manic episode. He's been a psychiatric hospital 2 times. His last visit was one year ago at [facility]. It is noted the patient recently has been diagnosed with hepatitis C and has abnormal liver enzymes. The patient actually has been doing very well and the plan was to discontinue his oral Risperdal. Today we'll go ahead and do that. Today the patient denies any symptoms/side effects from his lithium or Effexor. Today we did not interview him with his partner but we will do that next time. The patient is actively receiving an evaluation for his hepatitis C and has an appointment in [facility]. The patient is doing very well. He denies any GI symptoms. He denies any respiratory symptoms. He denies any neurological symptoms at this time. Diagnosis:   DSM5: Schizophrenia Disorders:   Obsessive-Compulsive Disorders:   Trauma-Stressor Disorders:   Substance/Addictive Disorders:   Depressive Disorders:    Axis I: Bipolar, Depressed  ADL's:  Intact  Sleep: Good  Appetite:  Good  Suicidal Ideation:  no Homicidal Ideation:  none AEB (as evidenced by):  Psychiatric Specialty Exam: ROS   Blood pressure 140/74, pulse 105, height 5\' 11"  (1.803 m), weight 252 lb (114.306 kg).Body mass index is 35.16 kg/(m^2).  General Appearance: Casual  Eye Contact::  Good  Speech:  Clear and Coherent  Volume:  Normal  Mood:  Euthymic  Affect:  Appropriate  Thought Process:  Coherent  Orientation:  Full (Time, Place, and Person)  Thought Content:  WDL  Suicidal Thoughts:  No  Homicidal Thoughts:  No  Memory:  nl  Judgement:  Good  Insight:  Good  Psychomotor Activity:  Normal  Concentration:  Good  Recall:  Good  Akathisia:  No  Handed:  Right  AIMS (if indicated):     Assets:  Communication Skills  Sleep:      Current Medications: Current Outpatient Prescriptions  Medication Sig Dispense Refill  . clonazePAM (KLONOPIN) 1 MG tablet TAKE 1 TABLET BY MOUTH TWICE DAILY  60 tablet  3  . lithium carbonate 300 MG capsule TAKE 2 CAPSULES BY MOUTH TWICE A DAY  120 capsule  3  . pantoprazole (PROTONIX) 40 MG tablet Take 1 tablet (40 mg total) by mouth daily.  30 tablet  3  . RISPERDAL CONSTA 50 MG injection INJECT 2ML INTO THE MUSCLE EVERY 14 DAYS  2 each  1  . venlafaxine XR (EFFEXOR-XR) 150 MG 24 hr capsule TAKE 3 CAPSULES BY MOUTH EVERY DAY  90 capsule  1   Current Facility-Administered Medications  Medication Dose Route Frequency Provider Last Rate Last Dose  . pneumococcal 23 valent vaccine (PNU-IMMUNE) injection 0.5 mL  0.5  mL Intramuscular Tomorrow-1000 Inda Castle, MD        Lab Results: No results found for this or any previous visit (from the past 48 hour(s)).  Physical Findings: AIMS:  , ,  ,  ,    CIWA:    COWS:     Treatment Plan Summary: At this time this patient will continue taking lithium 600 mg twice a day. He claims that he had a lithium blood level and we will look for that result. It is not completely evident to me that he has. The patient takes a high dose of Effexor 150 mg 3 in the morning. Issue of having abnormal liver enzymes in taking such a high  dose of Effexor should be looked into. Today we'll go ahead and discontinue his Risperdal 0.25 mg orally and yet continue his Risperdal Consta 50 mg every 2 weeks. Presently this patient is not in psychotherapy. This patient return to see me in 2 months. At that time we will confirm lab results. At this time the patient is doing well. Is not danger to himself or anybody else.  Plan:  Medical Decision Making Problem Points:  Established problem, stable/improving (1) Data Points:  Review of medication regiment & side effects (2)  I certify that inpatient services furnished can reasonably be expected to improve the patient's condition.   Kimmerly Lora, [place] 05/21/2013, 2:09 PM

## 2013-06-03 ENCOUNTER — Ambulatory Visit (INDEPENDENT_AMBULATORY_CARE_PROVIDER_SITE_OTHER): Payer: BC Managed Care – PPO | Admitting: *Deleted

## 2013-06-03 VITALS — BP 150/85 | HR 98 | Ht 71.0 in | Wt 257.6 lb

## 2013-06-03 DIAGNOSIS — F319 Bipolar disorder, unspecified: Secondary | ICD-10-CM

## 2013-06-03 MED ORDER — RISPERIDONE MICROSPHERES 50 MG IM SUSR: 50.0000 mg | INTRAMUSCULAR | Status: DC

## 2013-06-03 NOTE — Progress Notes (Signed)
  Risperdal Consta 50 mg IM  Left Upper Outer Quadrant  Lot# [id number],  patient supplied medication  MAR not available for charting

## 2013-06-06 ENCOUNTER — Other Ambulatory Visit (HOSPITAL_COMMUNITY): Payer: Self-pay | Admitting: Psychiatry

## 2013-06-12 ENCOUNTER — Telehealth: Payer: Self-pay

## 2013-06-12 NOTE — Telephone Encounter (Signed)
Left message for pt to call back  °

## 2013-06-12 NOTE — Telephone Encounter (Signed)
Message copied by Algernon Huxley on Thu Jun 12, 2013 11:42 AM ------      Message from: Erskine Emery D      Created: Wed Jun 11, 2013 12:21 PM       Cecilia      ----- Message -----         From: Maury Dus, RN         Sent: 06/11/2013  11:22 AM           To: Inda Castle, MD            Is this ok to schedule in [facility] or does it have to be at the hospital?                  ----- Message -----         From: Inda Castle, MD         Sent: 06/06/2013  10:39 AM           To: Maury Dus, RN            Please schedule patient for upper endoscopy to evaluate for esophageal varices.             ------

## 2013-06-16 NOTE — Telephone Encounter (Signed)
Pt states he does not know anything about having an EGD done. Wants to know if Dr. Deatra Ina has spoken with Dr. Monica Martinez? Also wants to know that if he does need the EGD can it wait until he sees Dr. Deatra Ina the 1st part of March to discuss this at his OV? Please advise.

## 2013-06-16 NOTE — Telephone Encounter (Signed)
Left message for pt to call back [phone].

## 2013-06-16 NOTE — Telephone Encounter (Signed)
Okay to discuss EGD with him at his next office visit

## 2013-06-17 ENCOUNTER — Ambulatory Visit (INDEPENDENT_AMBULATORY_CARE_PROVIDER_SITE_OTHER): Payer: BC Managed Care – PPO | Admitting: *Deleted

## 2013-06-17 VITALS — BP 139/78 | HR 93 | Ht 71.0 in | Wt 256.0 lb

## 2013-06-17 DIAGNOSIS — F319 Bipolar disorder, unspecified: Secondary | ICD-10-CM

## 2013-06-17 NOTE — Progress Notes (Signed)
Risperdal Consta 50 mg, given IM  Right Upper Outer Quadrant  Lot# [id number]  Patient supplied medication

## 2013-06-17 NOTE — Telephone Encounter (Signed)
Pt aware.

## 2013-06-29 ENCOUNTER — Other Ambulatory Visit (HOSPITAL_COMMUNITY): Payer: Self-pay | Admitting: Psychiatry

## 2013-06-29 DIAGNOSIS — F319 Bipolar disorder, unspecified: Secondary | ICD-10-CM

## 2013-07-01 ENCOUNTER — Ambulatory Visit (INDEPENDENT_AMBULATORY_CARE_PROVIDER_SITE_OTHER): Payer: BC Managed Care – PPO | Admitting: *Deleted

## 2013-07-01 VITALS — BP 143/85 | HR 88 | Ht 71.0 in | Wt 260.0 lb

## 2013-07-01 DIAGNOSIS — F319 Bipolar disorder, unspecified: Secondary | ICD-10-CM

## 2013-07-01 NOTE — Progress Notes (Signed)
Risperdal Consta 50 mg, given IM  Left Upper Outer Quadrant  Lot# [id number]  Patient supplied medication

## 2013-07-08 ENCOUNTER — Ambulatory Visit (INDEPENDENT_AMBULATORY_CARE_PROVIDER_SITE_OTHER): Payer: BC Managed Care – PPO | Admitting: Gastroenterology

## 2013-07-08 ENCOUNTER — Encounter: Payer: Self-pay | Admitting: Gastroenterology

## 2013-07-08 VITALS — BP 110/58 | HR 76 | Ht 69.0 in | Wt 257.2 lb

## 2013-07-08 DIAGNOSIS — N289 Disorder of kidney and ureter, unspecified: Secondary | ICD-10-CM

## 2013-07-08 DIAGNOSIS — B1910 Unspecified viral hepatitis B without hepatic coma: Secondary | ICD-10-CM

## 2013-07-08 DIAGNOSIS — N2889 Other specified disorders of kidney and ureter: Secondary | ICD-10-CM

## 2013-07-08 DIAGNOSIS — K7460 Unspecified cirrhosis of liver: Secondary | ICD-10-CM

## 2013-07-08 NOTE — Patient Instructions (Signed)
You have been scheduled for a CT scan of the abdomen and pelvis at [facility] ([address]---this is in the same building as [facility]).   You are scheduled on 07/10/2013 at [facility] should arrive at 10:15 to your appointment time for registration. Please follow the written instructions below on the day of your exam:  WARNING: IF YOU ARE ALLERGIC TO IODINE/X-RAY DYE, PLEASE NOTIFY RADIOLOGY IMMEDIATELY AT [phone]! YOU WILL BE GIVEN A 13 HOUR PREMEDICATION PREP.  Nothing to eat or drink 4 hours before your test  You have been scheduled for an endoscopy with propofol. Please follow written instructions given to you at your visit today. If you use inhalers (even only as needed), please bring them with you on the day of your procedure. Your physician has requested that you go to www.startemmi.com and enter the access code given to you at your visit today. This web site gives a general overview about your procedure. However, you should still follow specific instructions given to you by our office regarding your preparation for the procedure.

## 2013-07-08 NOTE — Addendum Note (Signed)
Addended by: Oda Kilts on: 07/08/2013 10:37 AM   Modules accepted: Orders

## 2013-07-08 NOTE — Assessment & Plan Note (Signed)
Angel [name] has chronic active hepatitis as evidenced by active viral replication.  Angel [name] also appears to have cirrhosis.  Note elevated alpha-fetoprotein.    Recommendations #1 upper endoscopy to rule out varices #2 CT of the abdomen and pelvis to rule out  hepatic mass

## 2013-07-08 NOTE — Progress Notes (Signed)
          History of Present Illness:  The patient has returned for followup of hepatitis B.  Lab work demonstrates  viral replication consistent with active disease.  He likely has cirrhosis as well.  His only complaint is blood-tinged drool.  He has been seen by Dr. Monica Martinez from hepatology and is about to undergo therapy for chronic hepatitis B.    Review of Systems: Pertinent positive and negative review of systems were noted in the above HPI section. All other review of systems were otherwise negative.    Current Medications, Allergies, Past Medical History, Past Surgical History, Family History and Social History were reviewed in [facility] record  Vital signs were reviewed in today's medical record. Physical Exam: General: Well developed , well nourished, no acute distress Skin: anicteric   See Assessment and Plan under Problem List

## 2013-07-09 ENCOUNTER — Encounter: Payer: Self-pay | Admitting: Gastroenterology

## 2013-07-10 ENCOUNTER — Telehealth (HOSPITAL_COMMUNITY): Payer: Self-pay

## 2013-07-10 ENCOUNTER — Ambulatory Visit (INDEPENDENT_AMBULATORY_CARE_PROVIDER_SITE_OTHER)
Admission: RE | Admit: 2013-07-10 | Discharge: 2013-07-10 | Disposition: A | Discharge status: Home / Self Care | Payer: BC Managed Care – PPO | Source: Ambulatory Visit | Attending: Gastroenterology | Admitting: Gastroenterology

## 2013-07-10 DIAGNOSIS — N289 Disorder of kidney and ureter, unspecified: Secondary | ICD-10-CM

## 2013-07-10 DIAGNOSIS — K7460 Unspecified cirrhosis of liver: Secondary | ICD-10-CM

## 2013-07-10 DIAGNOSIS — N2889 Other specified disorders of kidney and ureter: Secondary | ICD-10-CM

## 2013-07-10 MED ORDER — IOHEXOL 350 MG/ML SOLN
100.0000 mL | Freq: Once | INTRAVENOUS | Status: AC | PRN
  Administered 2013-07-10: 100 mL via INTRAVENOUS

## 2013-07-14 ENCOUNTER — Encounter: Payer: Self-pay | Admitting: Gastroenterology

## 2013-07-14 ENCOUNTER — Ambulatory Visit (AMBULATORY_SURGERY_CENTER): Payer: BC Managed Care – PPO | Admitting: Gastroenterology

## 2013-07-14 VITALS — BP 134/78 | HR 80 | Temp 98.1°F | Resp 20 | Ht 69.0 in | Wt 257.0 lb

## 2013-07-14 DIAGNOSIS — I8500 Esophageal varices without bleeding: Secondary | ICD-10-CM

## 2013-07-14 DIAGNOSIS — K2980 Duodenitis without bleeding: Secondary | ICD-10-CM

## 2013-07-14 DIAGNOSIS — K7460 Unspecified cirrhosis of liver: Secondary | ICD-10-CM

## 2013-07-14 DIAGNOSIS — K2970 Gastritis, unspecified, without bleeding: Secondary | ICD-10-CM

## 2013-07-14 DIAGNOSIS — K2990 Gastroduodenitis, unspecified, without bleeding: Secondary | ICD-10-CM

## 2013-07-14 MED ORDER — SODIUM CHLORIDE 0.9 % IV SOLN: 500.0000 mL | INTRAVENOUS | Status: DC

## 2013-07-14 MED ORDER — NADOLOL 40 MG PO TABS: 40.0000 mg | ORAL_TABLET | Freq: Every day | ORAL | Status: DC

## 2013-07-14 NOTE — Progress Notes (Signed)
No complaints noted in the recovery room. maw

## 2013-07-14 NOTE — Progress Notes (Signed)
Procedure ends, to recovery, report given and VSS. 

## 2013-07-14 NOTE — Op Note (Signed)
[facility]  [address]. [place], [id number]   ENDOSCOPY PROCEDURE REPORT  PATIENT: [name], Angel  MR#: [id number] BIRTHDATE: 1955/10/18 , 57  yrs. old GENDER: Male ENDOSCOPIST: Inda Castle, MD REFERRED BY: PROCEDURE DATE:  07/14/2013 PROCEDURE:  EGD w/ biopsy ASA CLASS:     Class III INDICATIONS:  Screening for varices, cirrhosis MEDICATIONS: MAC sedation, administered by CRNA, Propofol (Diprivan) 140 mg IV, and Simethicone 0.6cc PO TOPICAL ANESTHETIC:  DESCRIPTION OF PROCEDURE: After the risks benefits and alternatives of the procedure were thoroughly explained, informed consent was obtained.  The LB [id number] endoscope was introduced through the mouth and advanced to the third portion of the duodenum. Without limitations.  The instrument was slowly withdrawn as the mucosa was fully examined.      In the distal third of the esophagus there were 2-3+ nonbleeding varices. There were moderate to severe portal hypertensive gastropathy changes in the proximal stomach including the cardia and fundus. In the duodenal bulb there were several enlarged folds giving a polyploid appearance.  Biopsies were taken.   In the distal third of the esophagus there were 2-3+ nonbleeding varices. There were moderate to severe portal hypertensive gastropathy changes in the proximal stomach including the cardia and fundus. In the duodenal bulb there were several enlarged folds giving a polyploid appearance.  Biopsies were taken.   The remainder of the upper endoscopy exam was otherwise normal.  Retroflexed views revealed no abnormalities.     The scope was then withdrawn from the patient and the procedure completed.  COMPLICATIONS: There were no complications. ENDOSCOPIC IMPRESSION: 1.  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  portal hypertensive gastropathy 3.  enlarged folds, duodenal bulb  RECOMMENDATIONS: 1.  Begin nadolol 40 mg daily 2.  await biopsy  findings  REPEAT EXAM: One year  eSigned:  Inda Castle, MD 07/14/2013 11:34 AM   CC:  PATIENT NAME:  Angel, [name] MR#: [id number]

## 2013-07-14 NOTE — Patient Instructions (Signed)

## 2013-07-14 NOTE — Progress Notes (Signed)
Called to room to assist during endoscopic procedure.  Patient ID and intended procedure confirmed with present staff. Received instructions for my participation in the procedure from the performing physician.  

## 2013-07-15 ENCOUNTER — Telehealth: Payer: Self-pay | Admitting: *Deleted

## 2013-07-15 ENCOUNTER — Ambulatory Visit (INDEPENDENT_AMBULATORY_CARE_PROVIDER_SITE_OTHER): Payer: BC Managed Care – PPO | Admitting: *Deleted

## 2013-07-15 VITALS — BP 104/63 | HR 76 | Ht 71.0 in | Wt 254.0 lb

## 2013-07-15 DIAGNOSIS — F319 Bipolar disorder, unspecified: Secondary | ICD-10-CM

## 2013-07-15 NOTE — Progress Notes (Signed)
Risperdal Consta 50 mg, given IM  Right Upper Outer Quadrant  Lot# [id number]  Patient supplied medication   

## 2013-07-15 NOTE — Telephone Encounter (Signed)
No answer, message left

## 2013-07-18 ENCOUNTER — Encounter: Payer: Self-pay | Admitting: Gastroenterology

## 2013-07-22 ENCOUNTER — Encounter: Payer: Self-pay | Admitting: *Deleted

## 2013-07-25 ENCOUNTER — Ambulatory Visit (HOSPITAL_COMMUNITY): Payer: Self-pay | Admitting: Psychiatry

## 2013-07-28 ENCOUNTER — Other Ambulatory Visit: Payer: Self-pay | Admitting: Gastroenterology

## 2013-07-29 ENCOUNTER — Ambulatory Visit (INDEPENDENT_AMBULATORY_CARE_PROVIDER_SITE_OTHER): Payer: BC Managed Care – PPO | Admitting: *Deleted

## 2013-07-29 VITALS — BP 101/52 | HR 64 | Ht 71.0 in | Wt 254.2 lb

## 2013-07-29 DIAGNOSIS — F319 Bipolar disorder, unspecified: Secondary | ICD-10-CM

## 2013-07-29 NOTE — Progress Notes (Signed)
Risperdal Consta 50 mg, given IM  LeftUpper Outer Quadrant  Lot# [id number]  Patient supplied medication

## 2013-08-04 ENCOUNTER — Telehealth: Payer: Self-pay

## 2013-08-04 ENCOUNTER — Ambulatory Visit (AMBULATORY_SURGERY_CENTER): Payer: Self-pay | Admitting: *Deleted

## 2013-08-04 ENCOUNTER — Other Ambulatory Visit: Payer: Self-pay

## 2013-08-04 VITALS — Ht 69.0 in | Wt 261.0 lb

## 2013-08-04 DIAGNOSIS — Z86010 Personal history of colonic polyps: Secondary | ICD-10-CM

## 2013-08-04 DIAGNOSIS — R042 Hemoptysis: Secondary | ICD-10-CM

## 2013-08-04 MED ORDER — NA SULFATE-K SULFATE-MG SULF 17.5-3.13-1.6 GM/177ML PO SOLN: ORAL | Status: DC

## 2013-08-04 NOTE — Telephone Encounter (Signed)
Refer him to Dr. [place] from ENT for examination

## 2013-08-04 NOTE — Telephone Encounter (Signed)
Pt states that about every night for the past several months he has blood in his mouth at night and it wakes him up. He is taking Nadolol and is scheduled for a colon in April. Pt states he wanted to update [place] on this and let us know. Please advise.

## 2013-08-04 NOTE — Progress Notes (Signed)
Patient denies any allergies to eggs or soy. Patient denies any problems with anesthesia.  

## 2013-08-05 NOTE — Telephone Encounter (Signed)
Pt scheduled to see Dr. Erik Obey 08/18/13@3pm . Office is located at 5/80/[id number]$ [id number]. Left message for pt to call back. Records faxed to office at [phone].

## 2013-08-05 NOTE — Telephone Encounter (Signed)
Spoke with pt and he is aware of appt date and time. 

## 2013-08-12 ENCOUNTER — Ambulatory Visit (INDEPENDENT_AMBULATORY_CARE_PROVIDER_SITE_OTHER): Payer: BC Managed Care – PPO | Admitting: *Deleted

## 2013-08-12 VITALS — BP 125/70 | HR 90 | Ht 70.0 in | Wt 259.6 lb

## 2013-08-12 DIAGNOSIS — F319 Bipolar disorder, unspecified: Secondary | ICD-10-CM

## 2013-08-12 NOTE — Progress Notes (Signed)
Risperdal Consta 50 mg, given IM  Right Upper Outer Quadrant  Lot# [id number]  Patient supplied medication

## 2013-08-14 ENCOUNTER — Encounter: Payer: Self-pay | Admitting: Gastroenterology

## 2013-08-14 ENCOUNTER — Other Ambulatory Visit (HOSPITAL_COMMUNITY): Payer: Self-pay | Admitting: Psychiatry

## 2013-08-14 DIAGNOSIS — F319 Bipolar disorder, unspecified: Secondary | ICD-10-CM

## 2013-08-21 ENCOUNTER — Ambulatory Visit (AMBULATORY_SURGERY_CENTER): Payer: BC Managed Care – PPO | Admitting: Gastroenterology

## 2013-08-21 ENCOUNTER — Encounter: Payer: Self-pay | Admitting: Gastroenterology

## 2013-08-21 VITALS — BP 142/76 | HR 78 | Temp 98.7°F | Resp 20 | Ht 69.0 in | Wt 261.0 lb

## 2013-08-21 DIAGNOSIS — D126 Benign neoplasm of colon, unspecified: Secondary | ICD-10-CM

## 2013-08-21 DIAGNOSIS — Z86010 Personal history of colonic polyps: Secondary | ICD-10-CM

## 2013-08-21 MED ORDER — SODIUM CHLORIDE 0.9 % IV SOLN: 500.0000 mL | INTRAVENOUS | Status: DC

## 2013-08-21 NOTE — Patient Instructions (Signed)
YOU HAD AN ENDOSCOPIC PROCEDURE TODAY AT THE [facility]: Refer to the procedure report that was given to you for any specific questions about what was found during the examination.  If the procedure report does not answer your questions, please call your gastroenterologist to clarify.  If you requested that your care partner not be given the details of your procedure findings, then the procedure report has been included in a sealed envelope for you to review at your convenience later.  YOU SHOULD EXPECT: Some feelings of bloating in the abdomen. Passage of more gas than usual.  Walking can help get rid of the air that was put into your GI tract during the procedure and reduce the bloating. If you had a lower endoscopy (such as a colonoscopy or flexible sigmoidoscopy) you may notice spotting of blood in your stool or on the toilet paper. If you underwent a bowel prep for your procedure, then you may not have a normal bowel movement for a few days.  DIET: Your first meal following the procedure should be a light meal and then it is ok to progress to your normal diet.  A half-sandwich or bowl of soup is an example of a good first meal.  Heavy or fried foods are harder to digest and may make you feel nauseous or bloated.  Likewise meals heavy in dairy and vegetables can cause extra gas to form and this can also increase the bloating.  Drink plenty of fluids but you should avoid alcoholic beverages for 24 hours.  ACTIVITY: Your care partner should take you home directly after the procedure.  You should plan to take it easy, moving slowly for the rest of the day.  You can resume normal activity the day after the procedure however you should NOT DRIVE or use heavy machinery for 24 hours (because of the sedation medicines used during the test).    SYMPTOMS TO REPORT IMMEDIATELY: A gastroenterologist can be reached at any hour.  During normal business hours, 8:30 AM to 5:00 PM Monday through Friday,  call [phone].  After hours and on weekends, please call the GI answering service at [phone] who will take a message and have the physician on call contact you.   Following lower endoscopy (colonoscopy or flexible sigmoidoscopy):  Excessive amounts of blood in the stool  Significant tenderness or worsening of abdominal pains  Swelling of the abdomen that is new, acute  Fever of 100F or higher    FOLLOW UP: If any biopsies were taken you will be contacted by phone or by letter within the next 1-3 weeks.  Call your gastroenterologist if you have not heard about the biopsies in 3 weeks.  Our staff will call the home number listed on your records the next business day following your procedure to check on you and address any questions or concerns that you may have at that time regarding the information given to you following your procedure. This is a courtesy call and so if there is no answer at the home number and we have not heard from you through the emergency physician on call, we will assume that you have returned to your regular daily activities without incident.  SIGNATURES/CONFIDENTIALITY: You and/or your care partner have signed paperwork which will be entered into your electronic medical record.  These signatures attest to the fact that that the information above on your After Visit Summary has been reviewed and is understood.  Full responsibility of the confidentiality   of this discharge information lies with you and/or your care-partner.    Information on polyps given to you today 

## 2013-08-21 NOTE — Progress Notes (Signed)
Procedure ends, to recovery, report given and VSS. 

## 2013-08-21 NOTE — Progress Notes (Signed)
Called to room to assist during endoscopic procedure.  Patient ID and intended procedure confirmed with present staff. Received instructions for my participation in the procedure from the performing physician.  

## 2013-08-21 NOTE — Op Note (Addendum)
[facility]  [address]. [place], [address]   COLONOSCOPY PROCEDURE REPORT  PATIENT: Angel [name], Angel [name]  MR#: [id number] BIRTHDATE: [birth year] , 57  yrs. old GENDER: Male ENDOSCOPIST: Inda Castle, MD REFERRED GX:QJJHERD Asa Lente, M.D. PROCEDURE DATE:  08/21/2013 PROCEDURE:   Colonoscopy with snare polypectomy First Screening Colonoscopy -no  N/A  History of Adenoma 2010- Now for follow-up colonoscopy & has been > or = to 3 yrs.  N/A  Polyps Removed Today? Yes. ASA CLASS:   Class II INDICATIONS:average risk screening. MEDICATIONS: MAC sedation, administered by CRNA and propofol (Diprivan) 200mg  IV  DESCRIPTION OF PROCEDURE:   After the risks benefits and alternatives of the procedure were thoroughly explained, informed consent was obtained.  A digital rectal exam revealed no abnormalities of the rectum.   The LB [id number]  endoscope was introduced through the anus and advanced to the cecum, which was identified by both the appendix and ileocecal valve. No adverse events experienced.   .  There was a moderate amount of retained liquid stool in the left colon   The quality of the prep was Suprep fair  The instrument was then slowly withdrawn as the colon was fully examined.      COLON FINDINGS: A sessile polyp measuring 15 mm in size was found in the descending colon.  A polypectomy was performed with a cold snare.  The resection was complete and the polyp tissue was completely retrieved.   The colon was otherwise normal.  There was no diverticulosis, inflammation, polyps or cancers unless previously stated.  Retroflexed views revealed no abnormalities. The time to cecum=4 minutes 11 seconds.  Withdrawal time=12 minutes 06 seconds.  The scope was withdrawn and the procedure completed. COMPLICATIONS: There were no complications.  ENDOSCOPIC IMPRESSION: 1.   Sessile polyp measuring 15 mm in size was found in the descending colon; polypectomy was  performed with a cold snare 2.   The colon was otherwise normal  RECOMMENDATIONS: If the polyp(s) removed today are proven to be adenomatous (pre-cancerous) polyps, you will need a colonoscopy in 3 years. Otherwise you should continue to follow colorectal cancer screening guidelines for "routine risk" patients with a colonoscopy in 10 years.  You will receive a letter within 1-2 weeks with the results of your biopsy as well as final recommendations.  Please call my office if you have not received a letter after 3 weeks.   eSigned:  Inda Castle, MD 08/21/2013 3:09 PM Revised: 08/21/2013 3:09 PM  cc:   PATIENT NAME:  Angel [name], Angel [name] MR#: [id number]

## 2013-08-22 ENCOUNTER — Other Ambulatory Visit (HOSPITAL_COMMUNITY): Payer: Self-pay | Admitting: Psychiatry

## 2013-08-22 ENCOUNTER — Telehealth: Payer: Self-pay

## 2013-08-22 NOTE — Telephone Encounter (Signed)
Left message on answering machine. 

## 2013-08-25 ENCOUNTER — Other Ambulatory Visit (HOSPITAL_COMMUNITY): Payer: Self-pay | Admitting: *Deleted

## 2013-08-25 DIAGNOSIS — F319 Bipolar disorder, unspecified: Secondary | ICD-10-CM

## 2013-08-25 MED ORDER — RISPERIDONE MICROSPHERES 50 MG IM SUSR: INTRAMUSCULAR | Status: DC

## 2013-08-26 ENCOUNTER — Ambulatory Visit (HOSPITAL_COMMUNITY): Payer: Self-pay | Admitting: *Deleted

## 2013-08-27 ENCOUNTER — Ambulatory Visit (INDEPENDENT_AMBULATORY_CARE_PROVIDER_SITE_OTHER): Payer: BC Managed Care – PPO | Admitting: *Deleted

## 2013-08-27 ENCOUNTER — Encounter: Payer: Self-pay | Admitting: Gastroenterology

## 2013-08-27 VITALS — BP 117/75 | HR 74 | Ht 71.0 in | Wt 258.8 lb

## 2013-08-27 DIAGNOSIS — F319 Bipolar disorder, unspecified: Secondary | ICD-10-CM

## 2013-08-27 NOTE — Progress Notes (Signed)
Risperdal Consta 50 mg, given IM  Left Upper Outer Quadrant  Lot# [id number]  Patient supplied medication

## 2013-08-28 ENCOUNTER — Ambulatory Visit (INDEPENDENT_AMBULATORY_CARE_PROVIDER_SITE_OTHER): Payer: BC Managed Care – PPO | Admitting: Psychiatry

## 2013-08-28 VITALS — BP 126/65 | HR 71 | Ht 71.0 in | Wt 259.9 lb

## 2013-08-28 DIAGNOSIS — F3160 Bipolar disorder, current episode mixed, unspecified: Secondary | ICD-10-CM

## 2013-08-28 DIAGNOSIS — F319 Bipolar disorder, unspecified: Secondary | ICD-10-CM

## 2013-08-28 DIAGNOSIS — F3110 Bipolar disorder, current episode manic without psychotic features, unspecified: Secondary | ICD-10-CM

## 2013-08-28 MED ORDER — VENLAFAXINE HCL ER 150 MG PO CP24: ORAL_CAPSULE | ORAL | Status: DC

## 2013-08-28 MED ORDER — CLONAZEPAM 1 MG PO TABS: ORAL_TABLET | ORAL | Status: DC

## 2013-08-28 MED ORDER — RISPERIDONE MICROSPHERES 50 MG IM SUSR: INTRAMUSCULAR | Status: DC

## 2013-08-28 MED ORDER — LITHIUM CARBONATE 300 MG PO CAPS: ORAL_CAPSULE | ORAL | Status: DC

## 2013-08-28 NOTE — Progress Notes (Signed)
[facility] MD Progress Note  08/28/2013 3:40 PM Angel [name]  MRN:  [id number] Subjective:  Good spirits At this time the patient is doing well. The patient is sleeping and eating well. His mood is stable. He demonstrates no psychosis. He denies any symptoms of mania. 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 He is in a good relationship with [name] was actually interviewing for another job. Is a close relationship with [name] and things are stable with him. The patient does not drink any alcohol use any drugs. The patient is stable. He comes regularly for his [place] injections. Diagnosis:   DSM5: Schizophrenia Disorders:   Obsessive-Compulsive Disorders:   Trauma-Stressor Disorders:   Substance/Addictive Disorders:   Depressive Disorders:   Total Time spent with patient:   Axis I: Bipolar, Manic  ADL's:  Intact  Sleep: Good  Appetite:  Good  Suicidal Ideation:  no Homicidal Ideation:  none AEB (as evidenced by):  Psychiatric Specialty Exam: Physical Exam  ROS  Blood pressure 126/65, pulse 71, height 5\' 11"  (1.803 m), weight 259 lb 14.4 oz (117.89 kg).Body mass index is 36.26 kg/(m^2).  General Appearance: Casual  Eye Contact::  Absent  Speech:  Clear and Coherent  Volume:  Normal  Mood:  Euthymic  Affect:  Congruent  Thought Process:  Coherent  Orientation:  Full (Time, Place, and Person)  Thought Content:  WDL  Suicidal Thoughts:  No  Homicidal Thoughts:  No  Memory:  NA  Judgement:  Good  Insight:  Good  Psychomotor Activity:  Normal  Concentration:  Good  Recall:  Good  Fund of Knowledge:Good  Language: Good  Akathisia:  No  Handed:  Right  AIMS (if indicated):     Assets:  Desire for Improvement  Sleep:      Musculoskeletal: Strength & Muscle Tone:  Gait & Station:  Patient leans:   Current Medications: Current Outpatient Prescriptions  Medication Sig Dispense Refill  . clonazePAM (KLONOPIN) 1 MG tablet TAKE 1 TABLET BY MOUTH TWICE DAILY  60  tablet  5  . diphenhydrAMINE (BENADRYL) 25 MG tablet Take 75 mg by mouth at bedtime.      Marland Kitchen lithium carbonate 300 MG capsule TAKE 2 CAPSULES BY MOUTH TWICE A DAY  120 capsule  5  . nadolol (CORGARD) 40 MG tablet Take 1 tablet (40 mg total) by mouth daily.  30 tablet  5  . pantoprazole (PROTONIX) 40 MG tablet TAKE 1 TABLET BY MOUTH EVERY DAY  30 tablet  0  . risperiDONE microspheres (RISPERDAL CONSTA) 50 MG injection INJECT 2 ML INTO THE MUSCLE EVERY 14 DAYS  2 mL  5  . venlafaxine XR (EFFEXOR-XR) 150 MG 24 hr capsule TAKE 3 CAPSULES BY MOUTH EVERY DAY  90 capsule  5  . VIREAD 300 MG tablet        Current Facility-Administered Medications  Medication Dose Route Frequency Provider Last Rate Last Dose  . pneumococcal 23 valent vaccine (PNU-IMMUNE) injection 0.5 mL  0.5 mL Intramuscular Tomorrow-1000 Inda Castle, MD        Lab Results: No results found for this or any previous visit (from the past 48 hour(s)).  Physical Findings: AIMS:  , ,  ,  ,    CIWA:    COWS:     Treatment Plan Summary: At this time the patient is doing well. He'll continue taking Klonopin 1 mg at night, lithium as prescribed and Risperdal Consta. At this time we'll send the patient get a  lithium blood level and a TSH. The patient has been followed closely for his hepatitis b he is in active treatment for this condition. This patient to return to see me in 3 months.  Plan:  Medical Decision Making Problem Points:  Established problem, stable/improving (1) Data Points:  Review of medication regiment & side effects (2)  I certify that inpatient services furnished can reasonably be expected to improve the patient's condition.   Berneta Sages [facility] 08/28/2013, 3:40 PM

## 2013-09-10 ENCOUNTER — Ambulatory Visit (INDEPENDENT_AMBULATORY_CARE_PROVIDER_SITE_OTHER): Payer: BC Managed Care – PPO | Admitting: *Deleted

## 2013-09-10 VITALS — BP 115/66 | HR 70 | Ht 73.0 in | Wt 257.0 lb

## 2013-09-10 DIAGNOSIS — F319 Bipolar disorder, unspecified: Secondary | ICD-10-CM

## 2013-09-10 NOTE — Progress Notes (Signed)
Risperdal Consta 50 mg, given IM  Right Upper Outer Quadrant  Lot# [id number]  Patient supplied medication from [place]

## 2013-09-23 ENCOUNTER — Ambulatory Visit (INDEPENDENT_AMBULATORY_CARE_PROVIDER_SITE_OTHER): Payer: BC Managed Care – PPO | Admitting: *Deleted

## 2013-09-23 VITALS — BP 115/64 | HR 68 | Ht 71.0 in | Wt 253.4 lb

## 2013-09-23 DIAGNOSIS — F319 Bipolar disorder, unspecified: Secondary | ICD-10-CM

## 2013-09-23 NOTE — Progress Notes (Signed)
Risperdal Consta 50 mg, given IM  Left Upper Outer Quadrant  Lot# [id number]  Patient supplied medication from [place]

## 2013-09-26 ENCOUNTER — Encounter (HOSPITAL_COMMUNITY): Payer: Self-pay | Admitting: *Deleted

## 2013-09-26 NOTE — Progress Notes (Signed)
Venlafaxine ER 150 mg, three capsules daily authorized by [employer] [place] from 09/26/13 thru 09/2215 Case ID #[id number] Pharmacy and patient notified by phone

## 2013-10-02 ENCOUNTER — Encounter (HOSPITAL_COMMUNITY): Payer: Self-pay | Admitting: *Deleted

## 2013-10-02 NOTE — Progress Notes (Signed)
Venlafaxine ER 150 mg, #90/30 days authorized by [employer] Effective 09/26/13 thru 09/27/15 Patient and pharmacy notified

## 2013-10-03 ENCOUNTER — Ambulatory Visit (INDEPENDENT_AMBULATORY_CARE_PROVIDER_SITE_OTHER): Payer: BC Managed Care – PPO | Admitting: Gastroenterology

## 2013-10-03 ENCOUNTER — Encounter: Payer: Self-pay | Admitting: Gastroenterology

## 2013-10-03 VITALS — BP 98/60 | HR 60 | Ht 71.0 in | Wt 253.0 lb

## 2013-10-03 DIAGNOSIS — Z860100 Personal history of colon polyps, unspecified: Secondary | ICD-10-CM

## 2013-10-03 DIAGNOSIS — B1910 Unspecified viral hepatitis B without hepatic coma: Secondary | ICD-10-CM

## 2013-10-03 DIAGNOSIS — K7460 Unspecified cirrhosis of liver: Secondary | ICD-10-CM

## 2013-10-03 NOTE — Progress Notes (Signed)
          History of Present Illness:  The patient returned following upper and lower endoscopy.  Grade 2-3 esophageal varices were identified and he was started on nadolol.  An adenomatous polyp was removed at colonoscopy.  He is due to follow up at the hepatitis clinic to begin therapy for hepatitis B.  He has no new complaints.    Review of Systems: Pertinent positive and negative review of systems were noted in the above HPI section. All other review of systems were otherwise negative.    Current Medications, Allergies, Past Medical History, Past Surgical History, Family History and Social History were reviewed in [place] record  Vital signs were reviewed in today's medical record. Physical Exam: General: Well developed , well nourished, no acute distress  See Assessment and Plan under Problem List

## 2013-10-03 NOTE — Patient Instructions (Signed)
Follow-up in one year.

## 2013-10-03 NOTE — Assessment & Plan Note (Signed)
Plan followup colonoscopy 2020

## 2013-10-03 NOTE — Assessment & Plan Note (Signed)
Patient has stable cirrhosis with portal hypertension.  He was started on nadolol for esophageal varices.  Recommendations #1 followup endoscopy one year #2 patient will begin therapy for hepatitis B

## 2013-10-03 NOTE — Assessment & Plan Note (Signed)
Plan follow with Dr. Monica Martinez for treatment of hepatitis B

## 2013-10-07 ENCOUNTER — Encounter: Payer: Self-pay | Admitting: Internal Medicine

## 2013-10-07 ENCOUNTER — Ambulatory Visit (INDEPENDENT_AMBULATORY_CARE_PROVIDER_SITE_OTHER): Payer: BC Managed Care – PPO | Admitting: *Deleted

## 2013-10-07 ENCOUNTER — Ambulatory Visit (INDEPENDENT_AMBULATORY_CARE_PROVIDER_SITE_OTHER): Payer: BC Managed Care – PPO | Admitting: Internal Medicine

## 2013-10-07 ENCOUNTER — Other Ambulatory Visit (INDEPENDENT_AMBULATORY_CARE_PROVIDER_SITE_OTHER): Payer: BC Managed Care – PPO

## 2013-10-07 VITALS — BP 104/62 | HR 61 | Temp 97.7°F | Ht 71.0 in | Wt 255.8 lb

## 2013-10-07 VITALS — BP 118/68 | HR 72 | Wt 255.0 lb

## 2013-10-07 DIAGNOSIS — K7460 Unspecified cirrhosis of liver: Secondary | ICD-10-CM

## 2013-10-07 DIAGNOSIS — Z136 Encounter for screening for cardiovascular disorders: Secondary | ICD-10-CM

## 2013-10-07 DIAGNOSIS — R5383 Other fatigue: Secondary | ICD-10-CM

## 2013-10-07 DIAGNOSIS — R7309 Other abnormal glucose: Secondary | ICD-10-CM

## 2013-10-07 DIAGNOSIS — Z23 Encounter for immunization: Secondary | ICD-10-CM

## 2013-10-07 DIAGNOSIS — Z79899 Other long term (current) drug therapy: Secondary | ICD-10-CM

## 2013-10-07 DIAGNOSIS — K219 Gastro-esophageal reflux disease without esophagitis: Secondary | ICD-10-CM

## 2013-10-07 DIAGNOSIS — R5381 Other malaise: Secondary | ICD-10-CM

## 2013-10-07 DIAGNOSIS — F319 Bipolar disorder, unspecified: Secondary | ICD-10-CM

## 2013-10-07 DIAGNOSIS — R739 Hyperglycemia, unspecified: Secondary | ICD-10-CM

## 2013-10-07 DIAGNOSIS — F3130 Bipolar disorder, current episode depressed, mild or moderate severity, unspecified: Secondary | ICD-10-CM

## 2013-10-07 DIAGNOSIS — B1910 Unspecified viral hepatitis B without hepatic coma: Secondary | ICD-10-CM

## 2013-10-07 LAB — CBC WITH DIFFERENTIAL/PLATELET
BASOS PCT: 0.3 % (ref 0.0–3.0)
Basophils Absolute: 0 10*3/uL (ref 0.0–0.1)
EOS PCT: 1.9 % (ref 0.0–5.0)
Eosinophils Absolute: 0.1 10*3/uL (ref 0.0–0.7)
HCT: 37 % — ABNORMAL LOW (ref 39.0–52.0)
Hemoglobin: 12.4 g/dL — ABNORMAL LOW (ref 13.0–17.0)
LYMPHS PCT: 31.8 % (ref 12.0–46.0)
Lymphs Abs: 1.8 10*3/uL (ref 0.7–4.0)
MCHC: 33.4 g/dL (ref 30.0–36.0)
MCV: 109.3 fl — ABNORMAL HIGH (ref 78.0–100.0)
MONOS PCT: 9.9 % (ref 3.0–12.0)
Monocytes Absolute: 0.6 10*3/uL (ref 0.1–1.0)
Neutro Abs: 3.2 10*3/uL (ref 1.4–7.7)
Neutrophils Relative %: 56.1 % (ref 43.0–77.0)
PLATELETS: 63 10*3/uL — AB (ref 150.0–400.0)
RBC: 3.38 Mil/uL — AB (ref 4.22–5.81)
RDW: 16.2 % — ABNORMAL HIGH (ref 11.5–15.5)
WBC: 5.8 10*3/uL (ref 4.0–10.5)

## 2013-10-07 LAB — HEMOGLOBIN A1C: Hgb A1c MFr Bld: 5.3 % (ref 4.6–6.5)

## 2013-10-07 LAB — BASIC METABOLIC PANEL
BUN: 9 mg/dL (ref 6–23)
CO2: 26 mEq/L (ref 19–32)
CREATININE: 1 mg/dL (ref 0.4–1.5)
Calcium: 8.2 mg/dL — ABNORMAL LOW (ref 8.4–10.5)
Chloride: 107 mEq/L (ref 96–112)
GFR: 77.8 mL/min (ref >60.00)
Glucose, Bld: 215 mg/dL — ABNORMAL HIGH (ref 70–99)
Potassium: 3.9 mEq/L (ref 3.5–5.1)
Sodium: 136 mEq/L (ref 135–145)

## 2013-10-07 LAB — LIPID PANEL
CHOLESTEROL: 107 mg/dL (ref 0–200)
HDL: 34.4 mg/dL — AB (ref >39.00)
LDL Cholesterol: 59 mg/dL (ref 0–99)
Total CHOL/HDL Ratio: 3
Triglycerides: 69 mg/dL (ref 0.0–149.0)
VLDL: 13.8 mg/dL (ref 0.0–40.0)

## 2013-10-07 LAB — TSH: TSH: 2.87 u[IU]/mL (ref 0.35–4.50)

## 2013-10-07 LAB — HEPATIC FUNCTION PANEL
ALK PHOS: 106 U/L (ref 39–117)
ALT: 59 U/L — AB (ref 0–53)
AST: 78 U/L — AB (ref 0–37)
Albumin: 2.5 g/dL — ABNORMAL LOW (ref 3.5–5.2)
BILIRUBIN DIRECT: 0.4 mg/dL — AB (ref 0.0–0.3)
BILIRUBIN TOTAL: 1.5 mg/dL — AB (ref 0.2–1.2)
Total Protein: 6.1 g/dL (ref 6.0–8.3)

## 2013-10-07 NOTE — Assessment & Plan Note (Signed)
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

## 2013-10-07 NOTE — Patient Instructions (Signed)
It was good to see you today.  We have reviewed your prior records including labs and tests today  Test(s) ordered today. Your results will be released to [place] (or called to you) after review, usually within 72hours after test completion. If any changes need to be made, you will be notified at that same time.  Medications reviewed and updated, no changes recommended at this time.  Tdap immunization updated today  Please schedule followup in 6 months for semiannual review, call sooner if problems.

## 2013-10-07 NOTE — Assessment & Plan Note (Signed)
Ranitidine changed to PPI November 2014 Currently reports symptoms well controlled, continue same

## 2013-10-07 NOTE — Progress Notes (Signed)
Subjective:    Patient ID: Angel [name], male    DOB: 20-May-1955, 58 y.o.   MRN: [id number]  HPI  New patient tome, here to establish with PCP - follows with GI and psyc, but no prior PCP Reviewed chronic medical issues and interval medical events  Cirrhosis  -Advancing disease with recent diagnosis of grade 2-3 esophageal varices March 2015. Has been followed by hepatology clinic at [facility] and on treatment for hepatitis B related to same -reports increasing fatigue and abdominal distention, but denies bruising, confusion or edema -takes medications nadolol as prescribed  Bipolar - manic episode every 6 years per spouse -follows closely with behavioral health for same, currently Dr.Plovsky -concerned medications contributing to obesity and weight gain. Reports mood otherwise stable  GERD. Controlled with daily PPI. Denies indigestion, sour stomach or nocturnal symptoms  Past Medical History  Diagnosis Date  . Depression   . Bipolar disorder   . 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 Hepatitis B     Hepatitis E. antigen positive by history, status post treatment with Hepsera and baraclude   . Personal history of colonic polyps     Adenomas polyp 2008 and 2010, 2015    Family History  Problem Relation Age of Onset  . Heart disease Mother   . Diabetes Father   . Kidney failure Father   . Other Sister     GERD  . Colon cancer Neg Hx    History  Substance Use Topics  . Smoking status: Former Smoker    Types: Cigarettes    Quit date: 05/08/2009  . Smokeless tobacco: Never Used  . Alcohol Use: No    Review of Systems  Constitutional: Positive for fatigue. Negative for fever, activity change, appetite change and unexpected weight change.  Respiratory: Negative for cough, chest tightness, shortness of breath and wheezing.   Cardiovascular: Negative for chest pain, palpitations and leg swelling.  Neurological: Negative for dizziness, weakness and  headaches.  Psychiatric/Behavioral: Negative for dysphoric mood. The patient is not nervous/anxious.   All other systems reviewed and are negative.      Objective:   Physical Exam  BP 104/62  Pulse 61  Temp(Src) 97.7 F (36.5 C) (Oral)  Ht 5\' 11"  (1.803 m)  Wt 255 lb 12.8 oz (116.03 kg)  BMI 35.69 kg/m2  SpO2 94% Wt Readings from Last 3 Encounters:  10/07/13 255 lb 12.8 oz (116.03 kg)  10/03/13 253 lb (114.76 kg)  09/23/13 253 lb 6.4 oz (114.941 kg)   Constitutional: he is obese, but appears well-developed and well-nourished. No distress. Partner [name] at side HENT: Head: Normocephalic and atraumatic. Ears: B TMs ok, no erythema or effusion; Nose: Nose normal. Mouth/Throat: Oropharynx is clear and moist. No oropharyngeal exudate.  Eyes: Conjunctivae and EOM are normal. Pupils are equal, round, and reactive to light. No scleral icterus.  Neck: Thick, short. Normal range of motion. Neck supple. No JVD present. No thyromegaly present.  Cardiovascular: Normal rate, regular rhythm and normal heart sounds.  No murmur heard. trace ankle BLE edema. Pulmonary/Chest: Effort normal and breath sounds normal. No respiratory distress. he has no wheezes.  Abdominal: Soft. Bowel sounds are normal. he exhibits mild distension, but no evidence for ascites. There is no tenderness. no masses Musculoskeletal: Normal range of motion, no joint effusions. No gross deformities Neurological: he is alert and oriented to person, place, and time. No cranial nerve deficit. Coordination, balance, strength, speech and gait are normal.  Skin: Skin is pale, but warm and dry. No rash noted. No erythema.  Psychiatric: he has a reserved, quiet mood and affect. behavior is normal. Judgment and thought content normal.   Lab Results  Component Value Date   WBC 7.5 07/01/2010   HGB 13.3 07/01/2010   HCT 40.5 07/01/2010   PLT 186 07/01/2010   GLUCOSE 98 07/14/2010   ALT 41 07/14/2010   AST 22 07/14/2010   NA 138 07/14/2010    K 4.0 07/14/2010   CL 106 07/14/2010   CREATININE 0.92 07/14/2010   BUN 11 07/14/2010   CO2 27 07/14/2010    Ct Abdomen Pelvis W Wo Contrast  07/10/2013   CLINICAL DATA:  Cirrhosis. Indeterminate left renal lesion seen on recent ultrasound.  EXAM: CT ABDOMEN AND PELVIS WITHOUT AND WITH CONTRAST  TECHNIQUE: Multidetector CT imaging of the abdomen and pelvis was performed without contrast material in one or both body regions, followed by contrast material(s) and further sections in one or both body regions.  CONTRAST:  138mL OMNIPAQUE IOHEXOL 350 MG/ML SOLN  COMPARISON:  Ultrasound on 03/24/2013  FINDINGS: No evidence of renal calculi or hydronephrosis. No evidence of complex cystic or solid renal masses. A right extrarenal pelvis is seen, corresponding to the lesion seen in the renal hilum on the prior ultrasound. A small left extrarenal pelvis is also noted.  Hepatic cirrhosis is demonstrated. Mild splenomegaly is seen with spleen measuring approximately 14 cm in length, consistent with portal venous hypertension. Venous collaterals are seen in the right and left upper quadrants, however there is no evidence of ascites.  A 7 mm hypervascular nodule is seen in the liver dome on image 10, and an 8 mm hypervascular nodule is seen in the anterior segment right hepatic lobe on image 33. Small hepatocellular carcinomas cannot be excluded in the setting of cirrhosis. No other liver masses are identified.  The gallbladder shows mild diffuse wall thickening without dilatation, likely secondary to cirrhosis. The pancreas and adrenal glands are normal in appearance. Shotty lymph nodes are seen within the porta hepatis, small bowel mesentery, and retroperitoneum measuring less than 1 cm, which are nonspecific and likely reactive in the setting of cirrhosis. No pathologically enlarged lymph nodes or other masses are identified within the abdomen or pelvis. No evidence of acute inflammatory process or abscess. No evidence of dilated  bowel loops.  IMPRESSION: Right extrarenal pelvis corresponds with the finding on recent ultrasound. No evidence of renal neoplasm or hydronephrosis.  Hepatic cirrhosis, with mild splenomegaly and upper abdominal venous collaterals, consistent with portal venous hypertension.  Two small less than 1 cm hypervascular nodules in the liver. Small hepatocellular carcinomas cannot be excluded in the setting of cirrhosis. Recommend continued imaging followup in 6 months, preferably with abdomen MRI without and with contrast. This recommendation follows ACR consensus guidelines: Managing Incidental Findings on Abdominal CT: White Paper of the ACR Incidental Findings Committee. [facility] [phone]   Electronically Signed   By: Earle Gell M.D.   On: 07/10/2013 13:55       Assessment & Plan:   Problem List Items Addressed This Visit   Bipolar I disorder, most recent episode (or current) depressed, unspecified     Chronic history of same, recurrent episodes of severe mania every 6 years per spouse -usually requiring hospitalization during event, but stable at this time Reviewed with patient and family concerns of potential weight gain because of medications Advised followup with psychiatrist on same given history of  severe and historically unstable disease -no changes recommended by me at this time    Cirrhosis of liver without mention of alcohol     Follows with hepatology at [facility] for same Progressive disease related to chronic hepatitis B -on therapy for same Evidence for grade 2-3 esophageal varices on EGD March 2015 CT March 2015 with subcentimeter nodules and liver, but AFP negative Review compliance with beta blocker as prescribed, followup with hepatologist as planned and continue working with local GI as ongoing -interval history reviewed at length today    Relevant Orders      Amb ref to Medical Nutrition Therapy-MNT   Esophageal reflux     Ranitidine changed to PPI  November 2014 Currently reports symptoms well controlled, continue same    Hepatitis B - Primary   Hyperglycemia     Mild, random hyperglycemia at last labs reviewed Check A1c to exclude potential diabetes Advised on need for weight reduction and nutritional consult    Relevant Orders      Hemoglobin A1c      Amb ref to Medical Nutrition Therapy-MNT    Other Visit Diagnoses   Fatigue        Relevant Orders       Basic metabolic panel       CBC with Differential       Hepatic function panel       TSH    Screening for ischemic heart disease        Relevant Orders       Lipid panel    Need for prophylactic vaccination with combined diphtheria-tetanus-pertussis (DTP) vaccine        Relevant Orders       Tdap vaccine greater than or equal to 7yo IM (Completed)      Time spent with pt/family today 45 minutes, greater than 50% time spent counseling patient on cirrhosis with hepatitis B, GERD, bipolar history and medication review. Also review of prior records

## 2013-10-07 NOTE — Assessment & Plan Note (Signed)
Follows with hepatology at [facility] for same Progressive disease related to chronic hepatitis B -on therapy for same Evidence for grade 2-3 esophageal varices on EGD March 2015 CT March 2015 with subcentimeter nodules and liver, but AFP negative Review compliance with beta blocker as prescribed, followup with hepatologist as planned and continue working with local GI as ongoing -interval history reviewed at length today

## 2013-10-07 NOTE — Assessment & Plan Note (Signed)
Mild, random hyperglycemia at last labs reviewed Check A1c to exclude potential diabetes Advised on need for weight reduction and nutritional consult

## 2013-10-07 NOTE — Progress Notes (Signed)
Pre visit review using our clinic review tool, if applicable. No additional management support is needed unless otherwise documented below in the visit note. 

## 2013-10-07 NOTE — Progress Notes (Signed)
Risperdal Consta 50 mg, IM  Right Upper Outer Quadrant  Lot# [id number]  Patient supplied medication from [facility]   

## 2013-10-08 LAB — PROLACTIN: Prolactin: 29.5 ng/mL — ABNORMAL HIGH (ref 2.1–17.1)

## 2013-10-08 LAB — LITHIUM LEVEL: LITHIUM LVL: 0.9 meq/L (ref 0.80–1.40)

## 2013-10-21 ENCOUNTER — Ambulatory Visit (INDEPENDENT_AMBULATORY_CARE_PROVIDER_SITE_OTHER): Payer: BC Managed Care – PPO | Admitting: *Deleted

## 2013-10-21 VITALS — BP 120/62 | HR 69 | Ht 71.0 in | Wt 251.4 lb

## 2013-10-21 DIAGNOSIS — F319 Bipolar disorder, unspecified: Secondary | ICD-10-CM

## 2013-10-21 NOTE — Progress Notes (Signed)
Risperdal Consta 50 mg, IM   LeftUpper Outer Quadrant   Lot# [id number]   Patient supplied medication from [employer]

## 2013-10-28 ENCOUNTER — Other Ambulatory Visit: Payer: Self-pay | Admitting: Gastroenterology

## 2013-11-04 ENCOUNTER — Ambulatory Visit (INDEPENDENT_AMBULATORY_CARE_PROVIDER_SITE_OTHER): Payer: BC Managed Care – PPO | Admitting: *Deleted

## 2013-11-04 VITALS — BP 101/67 | HR 72 | Ht 71.0 in | Wt 253.0 lb

## 2013-11-04 DIAGNOSIS — F319 Bipolar disorder, unspecified: Secondary | ICD-10-CM

## 2013-11-04 NOTE — Progress Notes (Signed)
Risperdal Consta 50 mg, IM  Right Upper Outer Quadrant  Lot# [id number]  Patient supplied medication from [place]

## 2013-11-18 ENCOUNTER — Ambulatory Visit (INDEPENDENT_AMBULATORY_CARE_PROVIDER_SITE_OTHER): Payer: BC Managed Care – PPO | Admitting: *Deleted

## 2013-11-18 VITALS — BP 121/63 | HR 65 | Ht 71.0 in | Wt 254.2 lb

## 2013-11-18 DIAGNOSIS — F319 Bipolar disorder, unspecified: Secondary | ICD-10-CM

## 2013-11-18 NOTE — Progress Notes (Signed)
Risperdal Consta 50 mg, IM  Left Upper Outer Quadrant  Lot# [id number]  Patient supplied medication from [place]

## 2013-11-20 ENCOUNTER — Telehealth: Payer: Self-pay | Admitting: *Deleted

## 2013-11-20 DIAGNOSIS — R239 Unspecified skin changes: Secondary | ICD-10-CM

## 2013-11-20 NOTE — Telephone Encounter (Signed)
Left msg on triage requesting referral to see dermatologist. He has 5 skin tags on his chest, 1 on his back that he want removed...[name]

## 2013-11-20 NOTE — Telephone Encounter (Signed)
Called pt no answer LMOM md has place referral will be contacted once appt has been set-up with appt info...Angel [name]

## 2013-11-20 NOTE — Telephone Encounter (Signed)
Derm refer done

## 2013-11-25 ENCOUNTER — Other Ambulatory Visit: Payer: Self-pay | Admitting: Gastroenterology

## 2013-11-26 ENCOUNTER — Ambulatory Visit (INDEPENDENT_AMBULATORY_CARE_PROVIDER_SITE_OTHER): Payer: BC Managed Care – PPO | Admitting: Psychiatry

## 2013-11-26 VITALS — BP 109/59 | HR 70 | Ht 71.0 in | Wt 257.0 lb

## 2013-11-26 DIAGNOSIS — F319 Bipolar disorder, unspecified: Secondary | ICD-10-CM

## 2013-11-26 DIAGNOSIS — F3130 Bipolar disorder, current episode depressed, mild or moderate severity, unspecified: Secondary | ICD-10-CM

## 2013-11-26 MED ORDER — CLONAZEPAM 1 MG PO TABS: ORAL_TABLET | ORAL | Status: DC

## 2013-11-26 MED ORDER — RISPERIDONE MICROSPHERES 50 MG IM SUSR: INTRAMUSCULAR | Status: DC

## 2013-11-26 NOTE — Progress Notes (Signed)
[facility] MD Progress Note  11/26/2013 4:30 PM Kery Batzel  MRN:  [id number] Subjective: Fatigued Today the patient is seen with his partner [name]. The patient denies being depressed but describes being very lethargic and energy less. This seems to worsen. He is sleeping a lot his appetite is good. The patient denies feeling worthless. He is enjoying things less but still gets on the computer and still likes his 3 dogs. The patient denies any abdominal pain. It is noted that his labs are distinctly abnormal. His liver enzymes are mildly elevated but his bilirubin is 1.5. This is increased over the last few years. Presently the patient is in treatment for hepatitis at [facility]. The patient denies the use of alcohol. He denies any psychotic symptoms at this time. He takes a beta blocker for what he was told that he has esophageal variceal. It is evident this patient has a significant liver disorder. Am concerned that he does not have her return appointment at this time for his liver specialist. Emotionally the patient is somewhat stable. He denies mood instability. Air excessive is doing well. The patient is not suicidal. Diagnosis:   DSM5: Schizophrenia Disorders:   Obsessive-Compulsive Disorders:   Trauma-Stressor Disorders:   Substance/Addictive Disorders:   Depressive Disorders:   Total Time spent with patient: 30 minutes  Axis I: Bipolar, Depressed  ADL's:  Intact  Sleep: Good  Appetite:  Good  Suicidal Ideation:  no Homicidal Ideation:  none AEB (as evidenced by):  Psychiatric Specialty Exam: Physical Exam  ROS  Blood pressure 109/59, pulse 70, height 5\' 11"  (1.803 m), weight 257 lb (116.574 kg).Body mass index is 35.86 kg/(m^2).  General Appearance: Casual  Eye Contact::  Good  Speech:  Clear and Coherent  Volume:  Normal  Mood:  Euthymic  Affect:  Appropriate  Thought Process:  Coherent  Orientation:  Full (Time, Place, and Person)  Thought Content:   WDL  Suicidal Thoughts:  No  Homicidal Thoughts:  No  Memory:  NA  Judgement:  Good  Insight:  Good  Psychomotor Activity:  Normal  Concentration:  Fair  Recall:  Good  Fund of Knowledge:Good  Language: Good  Akathisia:  No  Handed:  Right  AIMS (if indicated):     Assets:  Communication Skills  Sleep:      Musculoskeletal: Strength & Muscle Tone:  Gait & Station:  Patient leans:   Current Medications: Current Outpatient Prescriptions  Medication Sig Dispense Refill  . clonazePAM (KLONOPIN) 1 MG tablet 1 qhs  30 tablet  5  . diphenhydrAMINE (BENADRYL) 25 MG tablet Take 75 mg by mouth at bedtime.      Marland Kitchen lithium carbonate 300 MG capsule TAKE 2 CAPSULES BY MOUTH TWICE A DAY  120 capsule  5  . nadolol (CORGARD) 40 MG tablet Take 1 tablet (40 mg total) by mouth daily.  30 tablet  5  . pantoprazole (PROTONIX) 40 MG tablet TAKE 1 TABLET BY MOUTH EVERY DAY  30 tablet  0  . risperiDONE microspheres (RISPERDAL CONSTA) 50 MG injection INJECT 2 ML INTO THE MUSCLE EVERY 14 DAYS  2 mL  5  . venlafaxine XR (EFFEXOR-XR) 150 MG 24 hr capsule TAKE 3 CAPSULES BY MOUTH EVERY DAY  90 capsule  5  . VIREAD 300 MG tablet Take 300 mg by mouth daily.        No current facility-administered medications for this visit.    Lab Results: No results found for this or any  previous visit (from the past 48 hour(s)).  Physical Findings: AIMS:  , ,  ,  ,    CIWA:    COWS:     Treatment Plan Summary: At this time this patient will continue taking Risperdal Consta every 2 weeks. At this time we'll begin the process of turning down his Effexor. Presently he takes 450 mg a day. No reduce it to 350 mg for one week and then 100 mg thereafter. He'll return to see me in 6 weeks. It is noted that he is RA begun reducing Klonopin. At one point he was taking 1 mg twice a day and now is taking just 1 mg at night. Today asking to reduce it further by just taking a half a milligram at night. When he returns in 6 weeks we  will discontinue his Klonopin and slowly reduce his Effexor. I will make an attempt to contact Mr. Kayleen Memos in [place] at [phone] which represents the clinic that he is getting his care for his liver. It is noted that his albumin is low and that is bilirubin is elevated. I would make sure if I can is providers are aware of this. This patient is very hesitant about making changes psychotropic medications. I will slow with him and his partner [name].  Plan:  Medical Decision Making Problem Points:  Established problem, worsening (2) Data Points:  Review of new medications or change in dosage (2)  I certify that inpatient services furnished can reasonably be expected to improve the patient's condition.   Driana Dazey, [place] 11/26/2013, 4:30 PM

## 2013-12-02 ENCOUNTER — Ambulatory Visit (INDEPENDENT_AMBULATORY_CARE_PROVIDER_SITE_OTHER): Payer: BC Managed Care – PPO | Admitting: *Deleted

## 2013-12-02 ENCOUNTER — Other Ambulatory Visit: Payer: Self-pay | Admitting: Gastroenterology

## 2013-12-02 ENCOUNTER — Encounter: Payer: Self-pay | Admitting: Internal Medicine

## 2013-12-02 VITALS — BP 118/77 | HR 71 | Ht 71.0 in | Wt 256.0 lb

## 2013-12-02 DIAGNOSIS — F319 Bipolar disorder, unspecified: Secondary | ICD-10-CM

## 2013-12-04 ENCOUNTER — Encounter: Payer: BC Managed Care – PPO | Attending: Internal Medicine | Admitting: Dietician

## 2013-12-04 ENCOUNTER — Encounter: Payer: Self-pay | Admitting: Dietician

## 2013-12-04 VITALS — Ht 71.0 in | Wt 255.5 lb

## 2013-12-04 DIAGNOSIS — E669 Obesity, unspecified: Secondary | ICD-10-CM | POA: Insufficient documentation

## 2013-12-04 DIAGNOSIS — Z713 Dietary counseling and surveillance: Secondary | ICD-10-CM | POA: Diagnosis present

## 2013-12-04 DIAGNOSIS — Z6835 Body mass index (BMI) 35.0-35.9, adult: Secondary | ICD-10-CM | POA: Insufficient documentation

## 2013-12-04 NOTE — Progress Notes (Signed)
  Medical Nutrition Therapy:  Appt start time: 4010 end time:  2725.   Assessment:  Primary concerns today: Angel [name] is here today since he is having trouble managing his weight and some of the medications he has may have caused weight gain. Was weighing 235-240 lbs about 2 months ago which had lost down to by eating mostly salads. Current weight is normal for past 5 years (has been a little higher than current weight).   Goal weight is 215 lbs which is what he weighed 5 years ago. Was actually eating worse at that time. Medications have changed since that time. Medications make him feel like he doesn't have energy and he may start weaning off some drugs to help have more energy under doctor supervision. He is not very active and has not been active anytime soon. Has a hx of bipolar and cirrhosis of the liver.  Angel [name] is not working and lives with Angel [name] who is at the appointment. Share the food shopping and preparation. Will skip lunch about 5-7 x week because he is not hungry then. Eats out about 1-2 x week. Does not like eating alone and Angel [name] gets home late sometimes. Angel [name] is concerned that Angel [name] gets "foggy" if he doesn't eat lunch.   Hgb A1c is 5.3% but his glucose was at 213 mg/dl the same morning.   Preferred Learning Style:   No preference indicated   Learning Readiness:   Ready  MEDICATIONS: see list   DIETARY INTAKE:  Usual eating pattern includes 2-53meals and 1-2 snacks per day.  Avoided foods include: leftovers, not very fond of ham    24-hr recall:  B ( AM): cereal (Raisin Bran) with 2% milk with diet cranberry grape juice or eggs with bacon/sausage with vegetables or toast Snk ( AM): none usually, goldfish or peanut butter  L ( PM): none usually, or light lunch  Snk ( PM): none D ( PM): [place] burgers with squash without bread (portions tend to be large) Snk ( PM): frozen yogurt Beverages: diet juice, light lemonade  Usual physical activity: none  Estimated energy  needs: 200 calories 225 g carbohydrates 150 g protein 56 g fat  Progress Towards Goal(s):  In progress.   Nutritional Diagnosis:  [place]-3.3 Overweight/obesity As related to chronic medical conditions, large portion sizes, and inadquate physical activity.  As evidenced by BMI of 35.6.    Intervention:  Nutrition counseling provided. Plan: Start using the elliptical 3 x week for 5-10 minutes to start and work up to 15-20 minutes. Plan to have several small snacks with protein and carbohydrates during the day.  Measure out cereal. Try [place] Go Lean Original (maybe mix with Raisin Bran). Think about adding vegetables (consider having pre washed veggies in fridge).  Teaching Method Utilized:  Visual Auditory Hands on  Handouts given during visit include:  MyPlate Handout  15 g CHO Snacks  Barriers to learning/adherence to lifestyle change:  Doesn't like to eat alone during the day, exercise has made him sick in the past  Demonstrated degree of understanding via:  Teach Back   Monitoring/Evaluation:  Dietary intake, exercise, and body weight in 1 month(s).

## 2013-12-04 NOTE — Patient Instructions (Addendum)
Start using the elliptical 3 x week for 5-10 minutes to start and work up to 15-20 minutes. Plan to have several small snacks with protein and carbohydrates during the day.  Measure out cereal. Try Alexander Mt Go Lean Original (maybe mix with Raisin Bran). Think about adding vegetables (consider having pre washed veggies in fridge).

## 2013-12-05 ENCOUNTER — Other Ambulatory Visit: Payer: Self-pay | Admitting: *Deleted

## 2013-12-05 MED ORDER — PANTOPRAZOLE SODIUM 40 MG PO TBEC: DELAYED_RELEASE_TABLET | ORAL | Status: DC

## 2013-12-16 ENCOUNTER — Ambulatory Visit (HOSPITAL_COMMUNITY): Payer: Self-pay | Admitting: *Deleted

## 2013-12-17 ENCOUNTER — Ambulatory Visit (HOSPITAL_COMMUNITY): Payer: Self-pay | Admitting: *Deleted

## 2013-12-18 ENCOUNTER — Ambulatory Visit (INDEPENDENT_AMBULATORY_CARE_PROVIDER_SITE_OTHER): Payer: BC Managed Care – PPO | Admitting: *Deleted

## 2013-12-18 DIAGNOSIS — F319 Bipolar disorder, unspecified: Secondary | ICD-10-CM

## 2013-12-18 NOTE — Progress Notes (Signed)
Risperdal Consta 50 mg, IM  Left Upper Outer Quadrant  Lot# [id number]  Patient supplied medication from [place]

## 2013-12-28 ENCOUNTER — Other Ambulatory Visit: Payer: Self-pay | Admitting: Gastroenterology

## 2013-12-30 ENCOUNTER — Other Ambulatory Visit: Payer: Self-pay

## 2013-12-30 ENCOUNTER — Telehealth: Payer: Self-pay

## 2013-12-30 ENCOUNTER — Ambulatory Visit (HOSPITAL_COMMUNITY): Payer: BC Managed Care – PPO | Admitting: *Deleted

## 2013-12-30 VITALS — BP 110/60 | HR 72 | Ht 71.0 in | Wt 257.4 lb

## 2013-12-30 DIAGNOSIS — F319 Bipolar disorder, unspecified: Secondary | ICD-10-CM

## 2013-12-30 NOTE — Telephone Encounter (Signed)
Left message to call.

## 2013-12-30 NOTE — Telephone Encounter (Signed)
Message copied by Greggory Keen on Tue Dec 30, 2013 11:16 AM ------      Message from: Erskine Emery D      Created: Tue Dec 30, 2013  9:32 AM       The patient needs a MRI of the liver sometime next month to evaluate liver nodules ------

## 2014-01-07 ENCOUNTER — Ambulatory Visit (INDEPENDENT_AMBULATORY_CARE_PROVIDER_SITE_OTHER): Payer: BC Managed Care – PPO | Admitting: Psychiatry

## 2014-01-07 VITALS — BP 121/80 | HR 89 | Ht 71.0 in | Wt 258.0 lb

## 2014-01-07 DIAGNOSIS — F332 Major depressive disorder, recurrent severe without psychotic features: Secondary | ICD-10-CM

## 2014-01-07 DIAGNOSIS — F319 Bipolar disorder, unspecified: Secondary | ICD-10-CM

## 2014-01-07 MED ORDER — CLONAZEPAM 1 MG PO TABS: ORAL_TABLET | ORAL | Status: DC

## 2014-01-07 MED ORDER — VENLAFAXINE HCL ER 150 MG PO CP24: ORAL_CAPSULE | ORAL | Status: DC

## 2014-01-07 MED ORDER — RISPERIDONE MICROSPHERES 50 MG IM SUSR: INTRAMUSCULAR | Status: DC

## 2014-01-07 NOTE — Progress Notes (Signed)
[facility] MD Progress Note  01/07/2014 4:30 PM Angel [name]  MRN:  [id number] Subjective:  Better At this time the patient's mood is significantly improved. He is sleeping and eating well. His concentration is good. The patient has reduced his Effexor down to 150 mg. I think we'll leave it here. The patient continues to take Risperdal Consta and will continue this for now. The patient has a high probability that him in his partner will be moving in the next few months. I am hesitant to change anything at this time. It is noted that he successfully reduce his Klonopin down to taking 0.5 mg twice a day. The patient is not suicidal. He says that his liver by use or better. He's going to have an MRI of his liver in the next 2 weeks. Patient seems better he is more animated. He demonstrates no psychosis. The patient agreed to return to see me in 2 months. Diagnosis:   DSM5: Schizophrenia Disorders:   Obsessive-Compulsive Disorders:   Trauma-Stressor Disorders:   Substance/Addictive Disorders:   Depressive Disorders:  Major Depressive Disorder - with Psychotic Features (296.24) Total Time spent with patient: 30 minutes  Axis I: Major Depression, Recurrent severe  ADL's:  Intact  Sleep: Good  Appetite:  Good  Suicidal Ideation:  no Homicidal Ideation:  none AEB (as evidenced by):  Psychiatric Specialty Exam: Physical Exam  ROS  Blood pressure 121/80, pulse 89, height 5\' 11"  (1.803 m), weight 258 lb (117.028 kg).Body mass index is 36 kg/(m^2).  General Appearance: Casual  Eye Contact::  Good  Speech:  Clear and Coherent  Volume:  Normal  Mood:  Euthymic  Affect:  Appropriate  Thought Process:  Coherent  Orientation:  Full (Time, Place, and Person)  Thought Content:  WDL  Suicidal Thoughts:  No  Homicidal Thoughts:  none  Memory:  NA  Judgement:  Good  Insight:  Good  Psychomotor Activity:  Normal  Concentration:  Good  Recall:  Good  Fund of Knowledge:Good  Language: Good   Akathisia:  No  Handed:  Right  AIMS (if indicated):     Assets:  Desire for Improvement  Sleep:      Musculoskeletal: Strength & Muscle Tone:  Gait & Station:  Patient leans:   Current Medications: Current Outpatient Prescriptions  Medication Sig Dispense Refill  . clonazePAM (KLONOPIN) 1 MG tablet 1 qhs  30 tablet  5  . diphenhydrAMINE (BENADRYL) 25 MG tablet Take 75 mg by mouth at bedtime.      Marland Kitchen lithium carbonate 300 MG capsule TAKE 2 CAPSULES BY MOUTH TWICE A DAY  120 capsule  5  . nadolol (CORGARD) 40 MG tablet TAKE 1 TABLET BY MOUTH DAILY  30 tablet  0  . pantoprazole (PROTONIX) 40 MG tablet TAKE 1 TABLET BY MOUTH EVERY DAY  30 tablet  2  . risperiDONE microspheres (RISPERDAL CONSTA) 50 MG injection INJECT 2 ML INTO THE MUSCLE EVERY 14 DAYS  2 each  5  . venlafaxine XR (EFFEXOR-XR) 150 MG 24 hr capsule 1 qam  30 capsule  5  . VIREAD 300 MG tablet Take 300 mg by mouth daily.        No current facility-administered medications for this visit.    Lab Results: No results found for this or any previous visit (from the past 48 hour(s)).  Physical Findings: AIMS:  , ,  ,  ,    CIWA:    COWS:     Treatment Plan Summary: At this  time we will hold steady taking 150 mg of Effexor which I think is better given his liver condition. She'll also continue on the lower dose of Klonopin 0.5 mg twice a day. He's not anxious nor is he depressed. Today the patient is seen alone without [name]. The patient continues to be followed up closely for his liver condition. The patient's expectation is he may be moving in about 4 months so we'll make a return appointment to see me for a last visit in 2 months. This patient is not suicidal.   Plan:  Medical Decision Making Problem Points:  Established problem, worsening (2) Data Points:  Review of medication regiment & side effects (2)  I certify that inpatient services furnished can reasonably be expected to improve the patient's condition.    Kaetlin Bullen, [place] 01/07/2014, 4:30 PM

## 2014-01-08 ENCOUNTER — Encounter: Payer: BC Managed Care – PPO | Attending: Internal Medicine | Admitting: Dietician

## 2014-01-08 VITALS — Ht 71.0 in | Wt 258.3 lb

## 2014-01-08 DIAGNOSIS — E669 Obesity, unspecified: Secondary | ICD-10-CM | POA: Diagnosis present

## 2014-01-08 DIAGNOSIS — Z713 Dietary counseling and surveillance: Secondary | ICD-10-CM | POA: Diagnosis present

## 2014-01-08 DIAGNOSIS — Z6835 Body mass index (BMI) 35.0-35.9, adult: Secondary | ICD-10-CM | POA: Diagnosis not present

## 2014-01-08 NOTE — Patient Instructions (Addendum)
Start walking in the evening 3 x week for 10 minutes to start and work up to 15-20 minutes. Continue having fruits and vegetables available for snacks. Plan to have several small snacks with protein and carbohydrates during the day.  If you have pasta, fill half of your plate with vegetables (mix in with pasta). Try spaghetti squash. Think about getting smaller plates to help portion size of meals.  Limit frozen yogurt to 2 x week.

## 2014-01-08 NOTE — Progress Notes (Signed)
  Medical Nutrition Therapy:  Appt start time: 6063 end time:  1600.   Assessment:  Primary concerns today: Norton returns today with a 3 lbs with gain. Has been eating more fruits and vegetables, started having Kashi cereal and mixing it with Raisin Bran, eating one hamburger instead of 2, having small snacks throughout the day. Trying to limit portion sizes of meals (measured out cereal).   Has been feeling better and less "foggy" overall. Feeling better than before.   Still having some frozen yogurt almost every night. Has gotten on the elliptical 2 times now but need to "psych himself up" to do more.   Preferred Learning Style:   No preference indicated   Learning Readiness:   Ready  MEDICATIONS: see list   DIETARY INTAKE:  Usual eating pattern includes 2-65meals and 1-2 snacks per day.  Avoided foods include: leftovers, not very fond of ham    24-hr recall:  B ( AM): cereal (Raisin Bran) with 2% milk with diet cranberry grape juice or eggs with bacon/sausage with vegetables or toast Snk ( AM): none usually, goldfish or peanut butter  L ( PM): none usually, or light lunch  Snk ( PM): none D ( PM): [place] burgers with squash without bread (portions tend to be large) Snk ( PM): frozen yogurt Beverages: diet juice, light lemonade  Usual physical activity: none  Estimated energy needs: 200 calories 225 g carbohydrates 150 g protein 56 g fat  Progress Towards Goal(s):  In progress.   Nutritional Diagnosis:  [place]-3.3 Overweight/obesity As related to chronic medical conditions, large portion sizes, and inadquate physical activity.  As evidenced by BMI of 35.6.    Intervention:  Nutrition counseling provided. Plan: Start walking in the evening 3 x week for 10 minutes to start and work up to 15-20 minutes. Continue having fruits and vegetables available for snacks. Plan to have several small snacks with protein and carbohydrates during the day.  If you have pasta, fill half  of your plate with vegetables (mix in with pasta). Try spaghetti squash. Think about getting smaller plates to help portion size of meals.  Limit frozen yogurt to 2 x week.  Teaching Method Utilized:  Visual Auditory Hands on  Handouts given during visit include:  MyPlate Handout  15 g CHO Snacks  Barriers to learning/adherence to lifestyle change:  Doesn't like to eat alone during the day, exercise has made him sick in the past  Demonstrated degree of understanding via:  Teach Back   Monitoring/Evaluation:  Dietary intake, exercise, and body weight in 1 month(s).

## 2014-01-13 ENCOUNTER — Ambulatory Visit (INDEPENDENT_AMBULATORY_CARE_PROVIDER_SITE_OTHER): Payer: BC Managed Care – PPO | Admitting: *Deleted

## 2014-01-13 VITALS — BP 118/66 | HR 72 | Ht 71.0 in | Wt 259.4 lb

## 2014-01-13 DIAGNOSIS — F319 Bipolar disorder, unspecified: Secondary | ICD-10-CM

## 2014-01-13 NOTE — Progress Notes (Signed)
Risperdal Consta 50 mg, IM  Left Upper Outer Quadrant  Lot# [id number], EXP 01/18 Patient supplied medication from [facility]

## 2014-01-14 ENCOUNTER — Telehealth: Payer: Self-pay

## 2014-01-14 NOTE — Telephone Encounter (Signed)
I have left message for the patient to call back. It is time to schedule an MRI with and without contrast to evaluate the liver nodules.

## 2014-01-14 NOTE — Telephone Encounter (Signed)
Message copied by Greggory Keen on Wed Jan 14, 2014  3:41 PM ------      Message from: HUNT, [place] R      Created: Wed Jan 14, 2014  3:16 PM      Regarding: MRI                   ----- Message -----         From: Maury Dus, RN         Sent: 01/12/2014           To: Maury Dus, RN            Needs MRI of abdomen with and without in 6 mth ------

## 2014-01-19 NOTE — Telephone Encounter (Signed)
I have left message for the patient to call back 

## 2014-01-20 NOTE — Telephone Encounter (Signed)
Left message to call.

## 2014-01-21 NOTE — Telephone Encounter (Signed)
"  MyChart" is listed as active. Email to patient requesting he contact me.

## 2014-01-23 NOTE — Telephone Encounter (Signed)
I have been unable to reach this patient by phone.  A letter is being sent.

## 2014-01-27 ENCOUNTER — Ambulatory Visit (INDEPENDENT_AMBULATORY_CARE_PROVIDER_SITE_OTHER): Payer: BC Managed Care – PPO | Admitting: *Deleted

## 2014-01-27 VITALS — BP 119/63 | HR 68 | Ht 71.0 in | Wt 258.3 lb

## 2014-01-27 DIAGNOSIS — F319 Bipolar disorder, unspecified: Secondary | ICD-10-CM

## 2014-01-27 NOTE — Progress Notes (Signed)
Risperdal Consta 50 mg, IM  Right Upper Outer Quadrant  Lot# [id number], EXP 01/18  Patient supplied medication from [facility]

## 2014-01-27 NOTE — Addendum Note (Signed)
Addended by: Rolland Bimler on: 01/27/2014 11:49 AM   Modules accepted: Level of Service

## 2014-02-03 NOTE — Telephone Encounter (Signed)
Confirmed the patient had the MRI at [facility] in [place]. Ordered by Kayleen Memos.

## 2014-02-10 ENCOUNTER — Ambulatory Visit (HOSPITAL_COMMUNITY): Payer: Self-pay | Admitting: *Deleted

## 2014-02-10 ENCOUNTER — Ambulatory Visit (INDEPENDENT_AMBULATORY_CARE_PROVIDER_SITE_OTHER): Payer: BC Managed Care – PPO | Admitting: *Deleted

## 2014-02-10 VITALS — BP 120/61 | HR 72 | Ht 71.0 in | Wt 264.8 lb

## 2014-02-10 DIAGNOSIS — F3130 Bipolar disorder, current episode depressed, mild or moderate severity, unspecified: Secondary | ICD-10-CM

## 2014-02-10 NOTE — Progress Notes (Signed)
Pt given injection of Risperdal consta 50mg  in Left upper outer quadrant. Pt supplied medication from his pharmacy. Lot #[id number]. No issues or complaints.

## 2014-02-12 ENCOUNTER — Ambulatory Visit (HOSPITAL_COMMUNITY): Payer: Self-pay | Admitting: *Deleted

## 2014-02-24 ENCOUNTER — Ambulatory Visit (INDEPENDENT_AMBULATORY_CARE_PROVIDER_SITE_OTHER): Payer: BC Managed Care – PPO | Admitting: *Deleted

## 2014-02-24 VITALS — BP 112/73 | HR 63 | Ht 71.0 in | Wt 259.8 lb

## 2014-02-24 DIAGNOSIS — F3130 Bipolar disorder, current episode depressed, mild or moderate severity, unspecified: Secondary | ICD-10-CM

## 2014-02-24 NOTE — Progress Notes (Signed)
Risperdal Consta 50 mg, IM Right upper outer quadrant.  Pt supplied medication from his pharmacy. Lot #[id number], EXP  05/2016 No issues or complaints.

## 2014-02-27 ENCOUNTER — Other Ambulatory Visit: Payer: Self-pay | Admitting: Gastroenterology

## 2014-03-10 ENCOUNTER — Ambulatory Visit (INDEPENDENT_AMBULATORY_CARE_PROVIDER_SITE_OTHER): Payer: BC Managed Care – PPO | Admitting: *Deleted

## 2014-03-10 VITALS — BP 129/74 | HR 70 | Ht 71.0 in | Wt 261.4 lb

## 2014-03-10 DIAGNOSIS — F3130 Bipolar disorder, current episode depressed, mild or moderate severity, unspecified: Secondary | ICD-10-CM

## 2014-03-10 NOTE — Progress Notes (Signed)
Risperdal Consta 50 mg, IM  Left Upper outer quadrant.  Pt supplied medication from [place]. Lot #[id number], EXP 05/2016 No issues or complaints regarding previous injection

## 2014-03-11 ENCOUNTER — Ambulatory Visit (HOSPITAL_COMMUNITY): Payer: Self-pay | Admitting: Psychiatry

## 2014-03-16 ENCOUNTER — Ambulatory Visit: Payer: Self-pay | Admitting: Dietician

## 2014-03-24 ENCOUNTER — Ambulatory Visit (INDEPENDENT_AMBULATORY_CARE_PROVIDER_SITE_OTHER): Payer: BC Managed Care – PPO | Admitting: *Deleted

## 2014-03-24 VITALS — BP 127/69 | HR 70 | Ht 71.0 in | Wt 261.0 lb

## 2014-03-24 DIAGNOSIS — F3130 Bipolar disorder, current episode depressed, mild or moderate severity, unspecified: Secondary | ICD-10-CM

## 2014-03-24 NOTE — Progress Notes (Signed)
Risperdal Consta 50 mg, IM  Right Upper outer quadrant.  Pt supplied medication from [place]. Lot #[id number], EXP 05/2016 No issues or complaints regarding previous injection. Reminded patient to make appointment with MD as he missed last appt on 11/4. He states he had appt for injection close to that time and thought the reminder call was for the injection appt. He states he will make appt.

## 2014-04-01 ENCOUNTER — Other Ambulatory Visit (HOSPITAL_COMMUNITY): Payer: Self-pay | Admitting: Psychiatry

## 2014-04-01 ENCOUNTER — Other Ambulatory Visit: Payer: Self-pay | Admitting: Gastroenterology

## 2014-04-07 ENCOUNTER — Ambulatory Visit (INDEPENDENT_AMBULATORY_CARE_PROVIDER_SITE_OTHER): Payer: BC Managed Care – PPO | Admitting: *Deleted

## 2014-04-07 VITALS — BP 112/63 | HR 69 | Ht 71.0 in | Wt 267.0 lb

## 2014-04-07 DIAGNOSIS — F3130 Bipolar disorder, current episode depressed, mild or moderate severity, unspecified: Secondary | ICD-10-CM

## 2014-04-07 NOTE — Progress Notes (Signed)
Risperdal Consta 50 mg, IM  Left Upper outer quadrant.  Pt supplied medication from [place]. Lot #[id number], EXP 05/2016 No issues or complaints regarding previous injection.

## 2014-04-08 ENCOUNTER — Ambulatory Visit: Payer: Self-pay | Admitting: Internal Medicine

## 2014-04-09 ENCOUNTER — Ambulatory Visit: Payer: BC Managed Care – PPO

## 2014-04-09 ENCOUNTER — Ambulatory Visit (INDEPENDENT_AMBULATORY_CARE_PROVIDER_SITE_OTHER): Payer: BC Managed Care – PPO | Admitting: *Deleted

## 2014-04-09 DIAGNOSIS — Z23 Encounter for immunization: Secondary | ICD-10-CM

## 2014-04-11 ENCOUNTER — Other Ambulatory Visit: Payer: Self-pay | Admitting: Gastroenterology

## 2014-04-17 ENCOUNTER — Ambulatory Visit (INDEPENDENT_AMBULATORY_CARE_PROVIDER_SITE_OTHER): Payer: BC Managed Care – PPO | Admitting: Psychiatry

## 2014-04-17 VITALS — BP 128/70 | HR 71 | Ht 71.0 in | Wt 265.4 lb

## 2014-04-17 DIAGNOSIS — F331 Major depressive disorder, recurrent, moderate: Secondary | ICD-10-CM

## 2014-04-17 DIAGNOSIS — F330 Major depressive disorder, recurrent, mild: Secondary | ICD-10-CM

## 2014-04-17 DIAGNOSIS — F332 Major depressive disorder, recurrent severe without psychotic features: Secondary | ICD-10-CM

## 2014-04-17 MED ORDER — RISPERIDONE MICROSPHERES 50 MG IM SUSR: INTRAMUSCULAR | Status: DC

## 2014-04-17 MED ORDER — VENLAFAXINE HCL ER 150 MG PO CP24: ORAL_CAPSULE | ORAL | Status: DC

## 2014-04-17 MED ORDER — LITHIUM CARBONATE 300 MG PO CAPS: ORAL_CAPSULE | ORAL | Status: DC

## 2014-04-17 MED ORDER — CLONAZEPAM 1 MG PO TABS: ORAL_TABLET | ORAL | Status: DC

## 2014-04-17 NOTE — Progress Notes (Signed)
[facility] MD Progress Note  04/17/2014 11:37 AM Angel [name]  MRN:  [id number] Subjective:  Doing great At this time the patient's mood is good. He is calm and content. Interview it is not clear why this patient has to come every 2 weeks to get a Risperdal Consta injection. My thinking is this patient could be changed to an oral medication. At this time he shows no evidence of psychosis. The patient denies being depressed. He is sleeping and eating well has good energy. He can think and concentrate without problems. He reads does TV is on the computer a lot. Is waiting to find out if his partner [name] will in fact be taking a job in [place]. At this time the patient's medical status is still questionable in that he says he has cirrhosis from an unknown etiology. The patient presently is in active treatment in the liver clinic. This patient's thoughts are clear and organized. He's handling the holidays well. Today is seen alone. The patient is not suicidal. He is functioning well. Diagnosis:   DSM5: Schizophrenia Disorders:   Obsessive-Compulsive Disorders:   Trauma-Stressor Disorders:   Substance/Addictive Disorders:   Depressive Disorders:  Major Depressive Disorder - with Psychotic Features (296.24) Total Time spent with patient: 20 minutes  Axis I: Major Depression, Recurrent severe  ADL's:  Intact  Sleep: Good  Appetite:  Good  Suicidal Ideation:  no Homicidal Ideation:  none AEB (as evidenced by):  Psychiatric Specialty Exam: Physical Exam  ROS  Blood pressure 128/70, pulse 71, height 5\' 11"  (1.803 m), weight 265 lb 6.4 oz (120.385 kg).Body mass index is 37.03 kg/(m^2).  General Appearance: Casual  Eye Contact::  Good  Speech:  Clear and Coherent  Volume:  Normal  Mood:  Euthymic  Affect:  Appropriate  Thought Process:  Coherent  Orientation:  Full (Time, Place, and Person)  Thought Content:  WDL  Suicidal Thoughts:  No  Homicidal Thoughts:  No  Memory:  NA  Judgement:  Good   Insight:  Good  Psychomotor Activity:  Normal  Concentration:  Good  Recall:  Good  Fund of Knowledge:Good  Language: Good  Akathisia:  No  Handed:  Right  AIMS (if indicated):     Assets:  Desire for Improvement  Sleep:      Musculoskeletal: Strength & Muscle Tone:  Gait & Station:  Patient leans:  Current Medications: Current Outpatient Prescriptions  Medication Sig Dispense Refill  . clonazePAM (KLONOPIN) 1 MG tablet 1 qhs 30 tablet 5  . diphenhydrAMINE (BENADRYL) 25 MG tablet Take 75 mg by mouth at bedtime.    Marland Kitchen lithium carbonate 300 MG capsule TAKE 2 CAPSULES BY MOUTH TWICE A DAY 120 capsule 5  . nadolol (CORGARD) 40 MG tablet TAKE 1 TABLET BY MOUTH EVERY DAY 30 tablet 0  . pantoprazole (PROTONIX) 40 MG tablet TAKE 1 TABLET BY MOUTH EVERY DAY 30 tablet 0  . risperiDONE microspheres (RISPERDAL CONSTA) 50 MG injection INJECT 2 ML INTO THE MUSCLE EVERY 14 DAYS 2 each 5  . venlafaxine XR (EFFEXOR-XR) 150 MG 24 hr capsule 1 qam 30 capsule 5  . VIREAD 300 MG tablet Take 300 mg by mouth daily.      No current facility-administered medications for this visit.    Lab Results: No results found for this or any previous visit (from the past 48 hour(s)).  Physical Findings: AIMS:  , ,  ,  ,    CIWA:    COWS:     Treatment  Plan Summary: At this time the patient will continue taking Effexor 150 mg, Klonopin 1 mg at night, lithium 300 mg 2 twice a day. The patient also takes Risperdal Consta 50. He takes it every 2 weeks. When her next visit he she'll come back with [name] and we she'll have an extended period where we will relook of why he was put on Risperdal constant. My hope is to switch him to low-dose Risperdal and ultimately to take him off of Risperdal. This patient is very stable and it's very evident that his antidepressants and lithium are very beneficial. We'll review any past history of psychosis. We'll review any past history of hospitalizations. At this time the patient is  doing well and is not suicidal.  Plan:  Medical Decision Making Problem Points:  Established problem, worsening (2) Data Points:  Review of medication regiment & side effects (2)  I certify that inpatient services furnished can reasonably be expected to improve the patient's condition.   Julann Mcgilvray, [place] 04/17/2014, 11:37 AM

## 2014-04-21 ENCOUNTER — Ambulatory Visit (INDEPENDENT_AMBULATORY_CARE_PROVIDER_SITE_OTHER): Payer: BC Managed Care – PPO | Admitting: *Deleted

## 2014-04-21 VITALS — BP 114/74 | HR 65 | Ht 71.0 in | Wt 264.4 lb

## 2014-04-21 DIAGNOSIS — F3130 Bipolar disorder, current episode depressed, mild or moderate severity, unspecified: Secondary | ICD-10-CM

## 2014-05-05 ENCOUNTER — Other Ambulatory Visit: Payer: Self-pay | Admitting: Gastroenterology

## 2014-05-05 ENCOUNTER — Ambulatory Visit (INDEPENDENT_AMBULATORY_CARE_PROVIDER_SITE_OTHER): Payer: BC Managed Care – PPO | Admitting: *Deleted

## 2014-05-05 VITALS — BP 115/76 | HR 66 | Ht 71.0 in | Wt 265.6 lb

## 2014-05-05 DIAGNOSIS — F330 Major depressive disorder, recurrent, mild: Secondary | ICD-10-CM

## 2014-05-11 NOTE — Addendum Note (Signed)
Addended by: Floyde Parkins E on: 05/11/2014 11:31 AM   Modules accepted: Level of Service

## 2014-05-11 NOTE — Addendum Note (Signed)
Addended by: Floyde Parkins E on: 05/11/2014 11:48 AM   Modules accepted: Level of Service

## 2014-05-11 NOTE — Addendum Note (Signed)
Addended by: Floyde Parkins E on: 05/11/2014 11:13 AM   Modules accepted: Level of Service

## 2014-05-19 ENCOUNTER — Ambulatory Visit (INDEPENDENT_AMBULATORY_CARE_PROVIDER_SITE_OTHER): Payer: BLUE CROSS/BLUE SHIELD | Admitting: Psychiatry

## 2014-05-19 ENCOUNTER — Encounter (HOSPITAL_COMMUNITY): Payer: Self-pay

## 2014-05-19 ENCOUNTER — Other Ambulatory Visit (HOSPITAL_COMMUNITY): Payer: Self-pay | Admitting: Psychiatry

## 2014-05-19 VITALS — BP 124/76 | HR 71 | Ht 71.0 in | Wt 261.2 lb

## 2014-05-19 DIAGNOSIS — F330 Major depressive disorder, recurrent, mild: Secondary | ICD-10-CM

## 2014-05-19 DIAGNOSIS — F259 Schizoaffective disorder, unspecified: Secondary | ICD-10-CM

## 2014-05-19 MED ORDER — RISPERIDONE MICROSPHERES 50 MG IM SUSR
50.0000 mg | INTRAMUSCULAR | Status: DC
  Administered 2014-05-19: 50 mg via INTRAMUSCULAR

## 2014-05-19 NOTE — Progress Notes (Addendum)
D. Patient presented with appropriate affect, level mood and denied any current symptoms or complaints.  Patient denied any auditory or visual hallucinations.  No problems with sleep or mood stated and no suicidal or homicidal ideations.  Patient brought in due Risperdal Consta 50mg /23ml IM every 2 week injection.  A. Injection given as ordered and will return in 2 weeks.  R. Patient denied any pain or discomfort from injection and will bring in next injection in 2 weeks.  Patient to call if any symptoms or problems prior.  Lot # [id number] Expires 01/18.  Injection given in Left gluteal area.

## 2014-05-27 NOTE — Addendum Note (Signed)
Addended by: Floyde Parkins E on: 05/27/2014 11:12 AM   Modules accepted: Level of Service

## 2014-05-27 NOTE — Addendum Note (Signed)
Addended by: Floyde Parkins E on: 05/27/2014 11:05 AM   Modules accepted: Level of Service

## 2014-06-01 ENCOUNTER — Other Ambulatory Visit: Payer: Self-pay | Admitting: Gastroenterology

## 2014-06-02 ENCOUNTER — Ambulatory Visit (INDEPENDENT_AMBULATORY_CARE_PROVIDER_SITE_OTHER): Payer: BLUE CROSS/BLUE SHIELD | Admitting: Psychiatry

## 2014-06-02 DIAGNOSIS — F331 Major depressive disorder, recurrent, moderate: Secondary | ICD-10-CM | POA: Diagnosis not present

## 2014-06-02 MED ORDER — RISPERIDONE MICROSPHERES 50 MG IM SUSR
50.0000 mg | Freq: Once | INTRAMUSCULAR | Status: AC
  Administered 2014-06-02: 50 mg via INTRAMUSCULAR

## 2014-06-02 NOTE — Patient Instructions (Signed)
Patient presented with appropriate affect, level mood and denied any auditory or visual hallucinations.  No suicidal or homicidal ideations.  States medication is working and is happy with current medication regimen.  Injection prepared and given to patient in his Right upper outer quadrant and patient tolerated without complaint.  Patient to return in 2 weeks for next scheduled injection and to call if any problems prior.

## 2014-06-16 ENCOUNTER — Encounter (HOSPITAL_COMMUNITY): Payer: Self-pay | Admitting: Psychiatry

## 2014-06-16 ENCOUNTER — Ambulatory Visit (INDEPENDENT_AMBULATORY_CARE_PROVIDER_SITE_OTHER): Payer: BLUE CROSS/BLUE SHIELD | Admitting: Psychiatry

## 2014-06-16 VITALS — BP 106/64 | HR 80 | Ht 71.0 in | Wt 266.0 lb

## 2014-06-16 DIAGNOSIS — F3130 Bipolar disorder, current episode depressed, mild or moderate severity, unspecified: Secondary | ICD-10-CM

## 2014-06-16 MED ORDER — RISPERIDONE MICROSPHERES 50 MG IM SUSR
50.0000 mg | INTRAMUSCULAR | Status: DC
  Administered 2014-06-16: 50 mg via INTRAMUSCULAR

## 2014-06-16 NOTE — Progress Notes (Signed)
D. Patient presented with appropriate affect, level mood and denied any auditory or visual hallucinations.  Patient denied any problems with sleep or appetite and no suicidal or homicidal ideations.  A. Patient's Risperdal Consta 50mg  Injection prepared as directed and given to patient intermuscular in his right gluteal upper outer quadrant.  R. Patient tolerated injection without complaint of pain or discomfort and agreed to return in 2 weeks.  Patient to call if any problems or concerns prior to next evaluation.

## 2014-06-21 ENCOUNTER — Other Ambulatory Visit: Payer: Self-pay | Admitting: Gastroenterology

## 2014-06-24 ENCOUNTER — Ambulatory Visit: Payer: Self-pay | Admitting: Internal Medicine

## 2014-06-30 ENCOUNTER — Encounter (HOSPITAL_COMMUNITY): Payer: Self-pay

## 2014-06-30 ENCOUNTER — Ambulatory Visit (INDEPENDENT_AMBULATORY_CARE_PROVIDER_SITE_OTHER): Payer: BLUE CROSS/BLUE SHIELD | Admitting: Psychiatry

## 2014-06-30 VITALS — BP 126/79 | HR 64 | Ht 71.0 in | Wt 265.0 lb

## 2014-06-30 DIAGNOSIS — F3130 Bipolar disorder, current episode depressed, mild or moderate severity, unspecified: Secondary | ICD-10-CM | POA: Diagnosis not present

## 2014-06-30 MED ORDER — RISPERIDONE MICROSPHERES 50 MG IM SUSR
50.0000 mg | INTRAMUSCULAR | Status: DC
  Administered 2014-06-30: 50 mg via INTRAMUSCULAR

## 2014-06-30 NOTE — Progress Notes (Signed)
D. Patient presented with appropriate affect, level mood and denied any auditory or visual hallucinations.  No suicidal or homicidal ideations and only complaint of not sleeping as well as of late.  Patient reported sleeping fine the previous night and stated no concern in this at present.  Patient reported still doing good with Risperdal Consta.  A. Patient's due Risperdal Consta 50mg /23ml injection prepared as ordered and given to patient in his left upper outer gluteal area.  R. Patient tolerated injection without complaint of pain or discomfort and agreed to call if any problems or concerns.  Will see again in 2 weeks for next due injection.

## 2014-07-14 ENCOUNTER — Ambulatory Visit (HOSPITAL_COMMUNITY): Payer: Self-pay

## 2014-07-16 ENCOUNTER — Ambulatory Visit (INDEPENDENT_AMBULATORY_CARE_PROVIDER_SITE_OTHER): Payer: BLUE CROSS/BLUE SHIELD | Admitting: Psychiatry

## 2014-07-16 ENCOUNTER — Encounter (HOSPITAL_COMMUNITY): Payer: Self-pay

## 2014-07-16 VITALS — BP 110/62 | HR 75 | Ht 71.0 in | Wt 264.8 lb

## 2014-07-16 DIAGNOSIS — F331 Major depressive disorder, recurrent, moderate: Secondary | ICD-10-CM

## 2014-07-16 MED ORDER — RISPERIDONE MICROSPHERES 50 MG IM SUSR
50.0000 mg | INTRAMUSCULAR | Status: DC
  Administered 2014-07-16: 50 mg via INTRAMUSCULAR

## 2014-07-16 NOTE — Progress Notes (Signed)
D. Patient presented with appropriate affect, level and pleasant mood but with a purplish discoloration "black eye" on the eyelid below his right eye.  Patient reported he took a nap on 07/15/14 and woke up with the discoloration.  Patient denied any pain or discomfort from the area and no discoloration of cornea or other areas of his eye area noted.  Patient reported this had never happened in the past and not sure what happened but stated "I feel fine" and no complaints.  Patient stated he was 2 days past due injection due to when he could come in this week but stated no current psychiatric symptoms; no auditory or visual hallucinations, no suicidal or homicidal ideations and no problems with sleep or mood.  A. Patient's due Risperdal Consta 50mg /71ml injection prepared as ordered and given to patient in his left upper outer gluteal area.  R. Patient tolerated injection without complaint of pain or discomfort and stated he may have to reschedule his appointment with Dr. Casimiro Needle in the coming week due to going out of town with his partner to [place].  Patient agreed to return for next due injection in 2 weeks and had Dr. Adele Schilder view his discolored eyelid as he also agreed this was possibly due to a spontaneous eruption of a blood vessel.  Patient agreed to contact his PCP if this occurred again or any other symptoms as denies any headaches, pain or changes to medications at this time.  Patient to call as needed.

## 2014-07-21 ENCOUNTER — Other Ambulatory Visit: Payer: Self-pay | Admitting: Gastroenterology

## 2014-07-22 ENCOUNTER — Ambulatory Visit (HOSPITAL_COMMUNITY): Payer: Self-pay | Admitting: Psychiatry

## 2014-07-30 ENCOUNTER — Encounter (HOSPITAL_COMMUNITY): Payer: Self-pay | Admitting: Psychiatry

## 2014-07-30 ENCOUNTER — Ambulatory Visit (INDEPENDENT_AMBULATORY_CARE_PROVIDER_SITE_OTHER): Payer: BLUE CROSS/BLUE SHIELD | Admitting: Psychiatry

## 2014-07-30 VITALS — BP 136/81 | HR 75 | Ht 71.0 in | Wt 267.0 lb

## 2014-07-30 DIAGNOSIS — F331 Major depressive disorder, recurrent, moderate: Secondary | ICD-10-CM

## 2014-07-30 MED ORDER — RISPERIDONE MICROSPHERES 50 MG IM SUSR
50.0000 mg | Freq: Once | INTRAMUSCULAR | Status: AC
  Administered 2014-07-30: 50 mg via INTRAMUSCULAR

## 2014-07-30 NOTE — Progress Notes (Signed)
D. Patient presented with appropriate affect, level and pleasant mood and denied any current symptoms or concerns.  Patient stated he did not have any more eye problems from last visit and eye back to normal color today.  Patient denied any auditory or visual hallucinations and no suicidal or homicidal ideations.  A. Patient's due Risperdal Consta 50mg /84ml injection every 2 week injection given as ordered in patient's right upper outer quadrant gluteal area.  R. Patient tolerated injection without complaint of pain or discomfort.  Patient agreed to return in 2 weeks for next injection and stated no concerns at this time.

## 2014-07-31 ENCOUNTER — Ambulatory Visit (HOSPITAL_COMMUNITY): Payer: Self-pay | Admitting: Psychiatry

## 2014-08-13 ENCOUNTER — Ambulatory Visit (INDEPENDENT_AMBULATORY_CARE_PROVIDER_SITE_OTHER): Payer: BLUE CROSS/BLUE SHIELD | Admitting: Psychiatry

## 2014-08-13 ENCOUNTER — Encounter (HOSPITAL_COMMUNITY): Payer: Self-pay | Admitting: Psychiatry

## 2014-08-13 VITALS — BP 121/74 | HR 78 | Ht 71.0 in | Wt 267.4 lb

## 2014-08-13 DIAGNOSIS — F331 Major depressive disorder, recurrent, moderate: Secondary | ICD-10-CM | POA: Diagnosis not present

## 2014-08-13 MED ORDER — RISPERIDONE MICROSPHERES 50 MG IM SUSR
50.0000 mg | INTRAMUSCULAR | Status: DC
  Administered 2014-08-13: 50 mg via INTRAMUSCULAR

## 2014-08-13 NOTE — Progress Notes (Signed)
D. Patient presented with appropriate affect, level and pleasant mood and denied any current symptoms or concerns.  Patient denied and auditory or visual hallucinations, no suicidal or homicidal ideations and no problems with sleep or appetite.  Patient reported currently enjoying doing gardening with spring bushes and flowers.  A. Patient's Risperdal Consta 50mg /12ml IM injection prepared as ordered and given to patient in his left upper outer quadrant gluteal area.  Patient tolerated injection without any complaint of pain or discomfort and agreed to return in 2 weeks for next injection.  Patient stated concern he was out of refills for his Klonopin.  Reviewed record as patient was given a new written prescription on 04/17/14 plus 5 refills.  Patient stated he may still have that prescription at home so will check and call back if not there to use for refill.  Patient to call if any problems prior to next appointment in 2 weeks.

## 2014-08-22 ENCOUNTER — Other Ambulatory Visit: Payer: Self-pay | Admitting: Gastroenterology

## 2014-08-27 ENCOUNTER — Ambulatory Visit (INDEPENDENT_AMBULATORY_CARE_PROVIDER_SITE_OTHER): Payer: BLUE CROSS/BLUE SHIELD | Admitting: Psychiatry

## 2014-08-27 VITALS — BP 107/69 | HR 72 | Ht 71.0 in | Wt 267.2 lb

## 2014-08-27 DIAGNOSIS — F331 Major depressive disorder, recurrent, moderate: Secondary | ICD-10-CM | POA: Diagnosis not present

## 2014-08-27 MED ORDER — RISPERIDONE MICROSPHERES 50 MG IM SUSR
50.0000 mg | Freq: Once | INTRAMUSCULAR | Status: AC
  Administered 2014-08-27: 50 mg via INTRAMUSCULAR

## 2014-08-27 NOTE — Progress Notes (Signed)
D. Patient presented with appropriate affect, level mood and denied any current problems with symptoms.  Patient denied any suicidal or homicidal ideations, no auditory or visual hallucinations and no problem with sleep, appetite or mood.  A. Patient's Risperdal Consta 50mg /47ml IM injection prepared as ordered and given to patient in his right upper outer gluteal quadrant.  R. Patient tolerated the injection without any complaint of pain or discomfort and stated plan to spend time with sister and her husband that were coming into town this weekend for a visit.  Patient agreed to return in 2 weeks for next due injection and to call if any problems prior to then.

## 2014-08-27 NOTE — Progress Notes (Addendum)
[facility] MD Progress Note  08/27/2014 2:10 PM Angel [name]  MRN:  [id number] Subjective:   Principal Problem: Bipolar 1 Diagnosis:   Patient Active Problem List   Diagnosis Date Noted  . Hyperglycemia [R73.9] 10/07/2013  . 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 Bipolar I disorder, most recent episode (or current) depressed, unspecified [F31.30] 05/21/2013  . Hepatitis B [B16.9] 03/21/2013  . Esophageal reflux [K21.9] 03/21/2013  . Personal history of colonic polyps [Z86.010] 03/21/2013  . Bipolar disorder, unspecified [F31.9] 09/03/2011   Total Time spent with patient: 30 minutes   Past Medical History:  Past Medical History  Diagnosis Date  . Depression   . Bipolar disorder   . 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 Hepatitis B     Hepatitis E. antigen positive by history, status post treatment with Hepsera and baraclude   . Personal history of colonic polyps     Adenomas polyp 2008 and 2010, 2015     Past Surgical History  Procedure Laterality Date  . Appendectomy    . Colonoscopy    . Upper gastrointestinal endoscopy    . Polypectomy     Family History:  Family History  Problem Relation Age of Onset  . Heart disease Mother   . Diabetes Father   . Kidney failure Father   . Other Sister     GERD  . Colon cancer Neg Hx    Social History:  History  Alcohol Use No     History  Drug Use No    History   Social History  . Marital Status: Single    Spouse Name: N/A  . Number of Children: 0  . Years of Education: N/A   Occupational History  . retired    Social History Main Topics  . Smoking status: Former Smoker    Types: Cigarettes    Quit date: 05/08/2009  . Smokeless tobacco: Never Used  . Alcohol Use: No  . Drug Use: No  . Sexual Activity: Not on file   Other Topics Concern  . Not on file   Social History Narrative   Additional History:    Sleep: Good  Appetite:  Good   Assessment:    Musculoskeletal: Strength & Muscle Tone:  Gait & Station:  Patient leans:    Psychiatric Specialty Exam: Physical Exam  ROS  There were no vitals taken for this visit.There is no weight on file to calculate BMI.  General Appearance: Casual  Eye Contact::  Good  Speech:  Clear and Coherent  Volume:  Normal  Mood:nl  Affect:  Congruent  Thought Process:  Coherent  Orientation:  Full (Time, Place, and Person)  Thought Content:  WDL  Suicidal Thoughts:  No  Homicidal Thoughts:  No  Memory:  NA  Judgement:  Good  Insight:  Fair  Psychomotor Activity:  Normal  Concentration:  Good  Recall:  Good  Fund of Knowledge:Good  Language: Good  Akathisia:  No  Handed:  Right  AIMS (if indicated):     Assets:  Desire for Improvement  ADL's:  Intact  Cognition: WNL  Sleep:        Current Medications: Current Outpatient Prescriptions  Medication Sig Dispense Refill  . clonazePAM (KLONOPIN) 1 MG tablet 1 qhs 30 tablet 5  . diphenhydrAMINE (BENADRYL) 25 MG tablet Take 75 mg by mouth at bedtime.    Marland Kitchen lithium carbonate 300 MG capsule TAKE 2 CAPSULES BY MOUTH TWICE A  DAY 120 capsule 5  . nadolol (CORGARD) 40 MG tablet TAKE 1 TABLET BY MOUTH EVERY DAY 30 tablet 0  . pantoprazole (PROTONIX) 40 MG tablet TAKE 1 TABLET BY MOUTH EVERY DAY 30 tablet 0  . risperiDONE microspheres (RISPERDAL CONSTA) 50 MG injection INJECT 2 ML INTO THE MUSCLE EVERY 14 DAYS 2 each 5  . venlafaxine XR (EFFEXOR-XR) 150 MG 24 hr capsule 1 qam 30 capsule 5  . VIREAD 300 MG tablet Take 300 mg by mouth daily.      No current facility-administered medications for this visit.   Facility-Administered Medications Ordered in Other Visits  Medication Dose Route Frequency Provider Last Rate Last Dose  . risperiDONE microspheres (RISPERDAL CONSTA) injection 50 mg  50 mg Intramuscular Q14 Days Norma Fredrickson, MD   50 mg at 05/19/14 1145  . risperiDONE microspheres (RISPERDAL CONSTA) injection 50 mg  50 mg Intramuscular  Q14 Days Norma Fredrickson, MD   50 mg at 06/16/14 1130  . risperiDONE microspheres (RISPERDAL CONSTA) injection 50 mg  50 mg Intramuscular Q14 Days Norma Fredrickson, MD   50 mg at 06/30/14 1140    Lab Results: No results found for this or any previous visit (from the past 48 hour(s)).  Physical Findings: AIMS:  , ,  ,  ,    CIWA:    COWS:     Treatment Plan Summary: Continue all medications and return to see me in a few months.   Medical Decision Making:  Established Problem, Stable/Improving (1)     Angel [name] 08/27/2014, 2:10 PM

## 2014-09-14 ENCOUNTER — Ambulatory Visit (INDEPENDENT_AMBULATORY_CARE_PROVIDER_SITE_OTHER): Payer: BLUE CROSS/BLUE SHIELD | Admitting: Psychiatry

## 2014-09-14 VITALS — BP 107/71 | HR 65 | Ht 71.0 in | Wt 266.4 lb

## 2014-09-14 DIAGNOSIS — F331 Major depressive disorder, recurrent, moderate: Secondary | ICD-10-CM

## 2014-09-14 MED ORDER — RISPERIDONE MICROSPHERES 50 MG IM SUSR
50.0000 mg | INTRAMUSCULAR | Status: DC
  Administered 2014-09-14: 50 mg via INTRAMUSCULAR

## 2014-09-14 NOTE — Progress Notes (Signed)
D. Patient presented with appropriate affect, level mood and denied any auditory or visual hallucinations, no suicidal or homicidal ideations and no problems with sleep or appetite.  Patient reported he was on his way here on 09/10/14 when was scheduled but had forgotten his Risperdal Consta at his home so rescheduled for today.  A. Patient's due Risperdal Consta 50mg /19ml injection prepared as ordered and given to patient in his Left upper outer gluteal quadrant. R. Patient tolerated injection without complaint of pain or discomfort.  Encouraged patient to try to stay on schedule with injections every 2 weeks as patient stated he would like to get back to Thursdays.  Patient will return on Tuesday 09/29/14 and then change back to Thursdays.  Patient to call if any problems and denies any current problems or symptoms.

## 2014-09-23 ENCOUNTER — Ambulatory Visit (INDEPENDENT_AMBULATORY_CARE_PROVIDER_SITE_OTHER): Payer: BLUE CROSS/BLUE SHIELD | Admitting: Psychiatry

## 2014-09-23 ENCOUNTER — Other Ambulatory Visit: Payer: Self-pay | Admitting: Gastroenterology

## 2014-09-23 VITALS — BP 108/70 | HR 58 | Ht 71.0 in | Wt 267.0 lb

## 2014-09-23 DIAGNOSIS — F3162 Bipolar disorder, current episode mixed, moderate: Secondary | ICD-10-CM

## 2014-09-23 DIAGNOSIS — F319 Bipolar disorder, unspecified: Secondary | ICD-10-CM

## 2014-09-23 DIAGNOSIS — F331 Major depressive disorder, recurrent, moderate: Secondary | ICD-10-CM

## 2014-09-23 DIAGNOSIS — F330 Major depressive disorder, recurrent, mild: Secondary | ICD-10-CM

## 2014-09-23 MED ORDER — RISPERIDONE MICROSPHERES 50 MG IM SUSR: 50.0000 mg | INTRAMUSCULAR | Status: DC

## 2014-09-23 MED ORDER — VENLAFAXINE HCL ER 150 MG PO CP24: ORAL_CAPSULE | ORAL | Status: DC

## 2014-09-23 MED ORDER — CLONAZEPAM 1 MG PO TABS: ORAL_TABLET | ORAL | Status: DC

## 2014-09-23 NOTE — Progress Notes (Signed)
[facility] MD Progress Note  09/23/2014 2:35 PM Angel [name]  MRN:  [id number] Subjective: Well Today the patient is seen with his partner. The patient is actually doing fairly well. Due to some missed appointments with me and esophagus and some acute computer problems it's been an extended period between visits. The patient unfortunately has done very well. He shows no signs of mania or depression. He is sleeping and eating well. 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 Fortunately he's not having any GI symptoms at all at this time. There some confusion about the dose of lithium. According to the computer he is taking 600 mg twice a day but he says is only taking 600 mg at night. The patient has not had a lithium level in a while will go ahead and get that. The patient seems to have fairly good energy. He is active. At some point the patient was planning to move with his partner to [place] but apparently that is not going to happen. The patient is not suicidal and is not psychotic. It is noted that he was hospitalized due to mania. At this time he has no significant psychiatric symptoms. He is medically stable. Principal Problem: Bipolar disorder Diagnosis:   Patient Active Problem List   Diagnosis Date Noted  . Hyperglycemia [R73.9] 10/07/2013  . 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 Bipolar I disorder, most recent episode (or current) depressed, unspecified [F31.30] 05/21/2013  . Hepatitis B [B16.9] 03/21/2013  . Esophageal reflux [K21.9] 03/21/2013  . Personal history of colonic polyps [Z86.010] 03/21/2013  . Bipolar disorder, unspecified [F31.9] 09/03/2011   Total Time spent with patient: 30 minutes   Past Medical History:  Past Medical History  Diagnosis Date  . Depression   . Bipolar disorder   . 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 Hepatitis B     Hepatitis E. antigen  positive by history, status post treatment with Hepsera and baraclude   . Personal history of colonic polyps     Adenomas polyp 2008 and 2010, 2015     Past Surgical History  Procedure Laterality Date  . Appendectomy    . Colonoscopy    . Upper gastrointestinal endoscopy    . Polypectomy     Family History:  Family History  Problem Relation Age of Onset  . Heart disease Mother   . Diabetes Father   . Kidney failure Father   . Other Sister     GERD  . Colon cancer Neg Hx    Social History:  History  Alcohol Use No     History  Drug Use No    History   Social History  . Marital Status: Single    Spouse Name: N/A  . Number of Children: 0  . Years of Education: N/A   Occupational History  . retired    Social History Main Topics  . Smoking status: Former Smoker    Types: Cigarettes    Quit date: 05/08/2009  . Smokeless tobacco: Never Used  . Alcohol Use: No  . Drug Use: No  . Sexual Activity: Not on file   Other Topics Concern  . Not on file   Social History Narrative   Additional History:    Sleep: Good  Appetite:  Good   Assessment:   Musculoskeletal: Strength & Muscle To Gait & Station:  Patient leans:  Psychiatric Specialty Exam: Physical Exam  ROS  Blood pressure 108/70, pulse 58, height 5\' 11"  (1.803 m), weight 267 lb (121.11 kg), SpO2 97 %.Body mass index is 37.26 kg/(m^2).  General Appearance: Casual  Eye Contact::  Absent  Speech:  Clear and Coherent  Volume:  Normal  Mood:  NA  Affect:  Congruent  Thought Process:  Coherent  Orientation:  Full (Time, Place, and Person)  Thought Content:  WDL  Suicidal Thoughts:  No  Homicidal Thoughts:  No  Memory:  NA  Judgement:  Good  Insight:  Fair  Psychomotor Activity:  Normal  Concentration:  Good  Recall:  Good  Fund of Knowledge:Good  Language: Good  Akathisia:  No  Handed:  Right  AIMS (if indicated):     Assets:  Communication Skills  ADL's:  Intact  Cognition: WNL   Sleep:        Current Medications: Current Outpatient Prescriptions  Medication Sig Dispense Refill  . clonazePAM (KLONOPIN) 1 MG tablet 1 qhs 30 tablet 5  . diphenhydrAMINE (BENADRYL) 25 MG tablet Take 75 mg by mouth at bedtime.    Marland Kitchen lithium carbonate 300 MG capsule TAKE 2 CAPSULES BY MOUTH TWICE A DAY 120 capsule 5  . nadolol (CORGARD) 40 MG tablet TAKE 1 TABLET BY MOUTH EVERY DAY 30 tablet 0  . pantoprazole (PROTONIX) 40 MG tablet TAKE 1 TABLET BY MOUTH EVERY DAY 30 tablet 0  . risperiDONE microspheres (RISPERDAL CONSTA) 50 MG injection INJECT 2 ML INTO THE MUSCLE EVERY 14 DAYS 2 each 5  . venlafaxine XR (EFFEXOR-XR) 150 MG 24 hr capsule 1 qam 30 capsule 5  . VIREAD 300 MG tablet Take 300 mg by mouth daily.      Current Facility-Administered Medications  Medication Dose Route Frequency Provider Last Rate Last Dose  . risperiDONE microspheres (RISPERDAL CONSTA) injection 50 mg  50 mg Intramuscular Q14 Days Norma Fredrickson, MD       Facility-Administered Medications Ordered in Other Visits  Medication Dose Route Frequency Provider Last Rate Last Dose  . risperiDONE microspheres (RISPERDAL CONSTA) injection 50 mg  50 mg Intramuscular Q14 Days Norma Fredrickson, MD   50 mg at 05/19/14 1145  . risperiDONE microspheres (RISPERDAL CONSTA) injection 50 mg  50 mg Intramuscular Q14 Days Norma Fredrickson, MD   50 mg at 09/14/14 1300    Lab Results: No results found for this or any previous visit (from the past 48 hour(s)).  Physical Findings: AIMS:  , ,  ,  ,    CIWA:    COWS:     Treatment Plan Summary: At this time the patient will continue taking 600 mg of lithium at night which is what he says he takes. The patient will get a lithium blood level as well as a conference a metabolic panel in the next month. The patient continue on Risperdal Consta injections but my wish on his next visit is to switch it to by mouth. This changes acceptable by the patient and his partner. The possibility of  reducing and discontinuing Risperdal will then be considered. Patient says he is gained a significant mental weight on this agent. The patient will continue on Effexor as prescribed. This patient is stable he is not suicidal. I will be cautious about removing medications for mania. It should be noted that once we confirmed that a lithium level is in a therapeutic range I would then 2 more comfortably slowly reducing his Risperdal by mouth. He'll continue taking the Effexor.  Medical Decision Making:  Established Problem, Stable/Improving (1)     Angel [name] 09/23/2014, 2:35 PM

## 2014-09-29 ENCOUNTER — Ambulatory Visit (INDEPENDENT_AMBULATORY_CARE_PROVIDER_SITE_OTHER): Payer: BLUE CROSS/BLUE SHIELD | Admitting: Psychiatry

## 2014-09-29 DIAGNOSIS — F330 Major depressive disorder, recurrent, mild: Secondary | ICD-10-CM | POA: Diagnosis not present

## 2014-09-29 MED ORDER — RISPERIDONE MICROSPHERES 50 MG IM SUSR
50.0000 mg | INTRAMUSCULAR | Status: DC
  Administered 2014-09-29: 50 mg via INTRAMUSCULAR

## 2014-09-29 NOTE — Psych (Signed)
D. Patient presented with appropriate affect, level mood and denied any current symptoms or complaints.  No suicidal or homicidal ideations, no auditory or visual hallucinations and no problems with sleep or appetite. Patient stated doing fine with current medications and no complaints.  A. Patient's Risperdal Consta 50mg /64ml IM injection prepared as ordered and given to patient in his Right upper outer gluteal quadrant.  R. Patient tolerated injection without complaint of pain or any discomfort.  Patient to return in 2 weeks and to call if any problems prior to then.

## 2014-10-06 ENCOUNTER — Ambulatory Visit (HOSPITAL_COMMUNITY): Payer: Self-pay

## 2014-10-13 ENCOUNTER — Ambulatory Visit (INDEPENDENT_AMBULATORY_CARE_PROVIDER_SITE_OTHER): Payer: BLUE CROSS/BLUE SHIELD | Admitting: Psychiatry

## 2014-10-13 ENCOUNTER — Encounter (HOSPITAL_COMMUNITY): Payer: Self-pay

## 2014-10-13 VITALS — BP 112/72 | HR 61 | Ht 71.0 in | Wt 266.8 lb

## 2014-10-13 DIAGNOSIS — F331 Major depressive disorder, recurrent, moderate: Secondary | ICD-10-CM

## 2014-10-13 MED ORDER — RISPERIDONE MICROSPHERES 50 MG IM SUSR
50.0000 mg | Freq: Once | INTRAMUSCULAR | Status: AC
  Administered 2014-10-13: 50 mg via INTRAMUSCULAR

## 2014-10-13 NOTE — Progress Notes (Signed)
Patient ID: Angel [name], male   DOB: Oct 07, 1955, 59 y.o.   MRN: [id number] D. Patient presented with appropriate affect, level and pleasant mood and denied any current problems with medications or symptoms.  Patient denied any suicidal or homicidal ideations, no problems with sleep, mood or appetite.  A. Patient's Risperdal Consta 50mg /35ml IM injection prepared as ordered and given to patient in his right upper outer gluteal area.  R. Patient tolerated injection without any complaint of pain or discomfort.  Stated would call if any problems and will return for next injection in 2 weeks as patient reported needing to come today to get back on Tuesdays for injection.  Patient to call as needed.

## 2014-10-22 ENCOUNTER — Other Ambulatory Visit (HOSPITAL_COMMUNITY): Payer: Self-pay | Admitting: Psychiatry

## 2014-10-22 LAB — COMPREHENSIVE METABOLIC PANEL
ALT: 32 U/L (ref 0–53)
AST: 30 U/L (ref 0–37)
Albumin: 3.2 g/dL — ABNORMAL LOW (ref 3.5–5.2)
Alkaline Phosphatase: 99 U/L (ref 39–117)
BUN: 9 mg/dL (ref 6–23)
CO2: 22 mEq/L (ref 19–32)
Calcium: 8.3 mg/dL — ABNORMAL LOW (ref 8.4–10.5)
Chloride: 106 mEq/L (ref 96–112)
Creat: 0.94 mg/dL (ref 0.50–1.35)
GLUCOSE: 240 mg/dL — AB (ref 70–99)
POTASSIUM: 3.9 meq/L (ref 3.5–5.3)
Sodium: 139 mEq/L (ref 135–145)
Total Bilirubin: 0.6 mg/dL (ref 0.2–1.2)
Total Protein: 5.9 g/dL — ABNORMAL LOW (ref 6.0–8.3)

## 2014-10-23 LAB — LITHIUM LEVEL: Lithium Lvl: 0.4 mEq/L — ABNORMAL LOW (ref 0.80–1.40)

## 2014-10-27 ENCOUNTER — Ambulatory Visit (INDEPENDENT_AMBULATORY_CARE_PROVIDER_SITE_OTHER): Payer: BLUE CROSS/BLUE SHIELD | Admitting: Psychiatry

## 2014-10-27 VITALS — BP 108/68 | HR 64 | Ht 71.0 in | Wt 266.8 lb

## 2014-10-27 DIAGNOSIS — F330 Major depressive disorder, recurrent, mild: Secondary | ICD-10-CM | POA: Diagnosis not present

## 2014-10-27 MED ORDER — RISPERIDONE MICROSPHERES 50 MG IM SUSR: INTRAMUSCULAR | Status: DC

## 2014-10-27 MED ORDER — RISPERIDONE MICROSPHERES 50 MG IM SUSR
50.0000 mg | INTRAMUSCULAR | Status: DC
  Administered 2014-10-27: 50 mg via INTRAMUSCULAR

## 2014-10-27 NOTE — Progress Notes (Signed)
Patient ID: Angel [name], male   DOB: Dec 04, 1955, 59 y.o.   MRN: [id number] D. Patient presented with appropriate affect, level and pleasant mood and denied any auditory or visual hallucinations, no suicidal or homicidal ideations and no problems with sleep or appetite. A. Patient's due Risperdal Consta 50mg /57ml IM injection prepared as ordered and given to patient in his Right upper outer gluteal area.  R. Patient tolerated injection without any complaint of pain or discomfort and patient agreed to return in 2 weeks for next injection.  Patient stated "I hardly even felt it today" and new order prepared by Dr. Lovena Le so patient can pick up refill when needed.  Patient to call if any problems and will see in 2 weeks.

## 2014-11-05 ENCOUNTER — Other Ambulatory Visit: Payer: Self-pay

## 2014-11-10 ENCOUNTER — Ambulatory Visit (HOSPITAL_COMMUNITY): Payer: Self-pay

## 2014-11-10 ENCOUNTER — Telehealth: Payer: Self-pay | Admitting: Gastroenterology

## 2014-11-10 ENCOUNTER — Other Ambulatory Visit: Payer: Self-pay | Admitting: Gastroenterology

## 2014-11-11 NOTE — Telephone Encounter (Signed)
Medication was sent through med request electronically this morning  Tried to call patient No Answer that med was sent

## 2014-11-17 ENCOUNTER — Ambulatory Visit (INDEPENDENT_AMBULATORY_CARE_PROVIDER_SITE_OTHER): Payer: BLUE CROSS/BLUE SHIELD | Admitting: Psychiatry

## 2014-11-17 VITALS — BP 110/57 | HR 60 | Ht 71.0 in | Wt 267.6 lb

## 2014-11-17 DIAGNOSIS — F331 Major depressive disorder, recurrent, moderate: Secondary | ICD-10-CM

## 2014-11-17 MED ORDER — RISPERIDONE MICROSPHERES 50 MG IM SUSR
50.0000 mg | INTRAMUSCULAR | Status: DC
  Administered 2014-11-17: 50 mg via INTRAMUSCULAR

## 2014-11-17 NOTE — Progress Notes (Signed)
Patient ID: Angel [name], male   DOB: 1955-12-31, 59 y.o.   MRN: [id number] D. Patient presented with appropriate affect, pleasant and level mood and denied any auditory or visual hallucination and no problems with mood, sleep or appetite.  Patient reported he just forgot about appointment the previous week for due injection and apologized.  A. Patient's past due Risperdal Consta 50mg /67m. IM injection prepared as ordered and given to patient in his Left upper outer gluteal quadrant.  R. Patient tolerated injection without complaint of pain or discomfort and discussed need to stay on every 2 week schedule.  Patient agreed and will call if any problems.  Patient stable at this time with no complaints or concerns noted.

## 2014-12-02 ENCOUNTER — Ambulatory Visit (INDEPENDENT_AMBULATORY_CARE_PROVIDER_SITE_OTHER): Payer: BLUE CROSS/BLUE SHIELD | Admitting: Psychiatry

## 2014-12-02 VITALS — BP 108/68 | HR 65 | Ht 71.0 in | Wt 266.4 lb

## 2014-12-02 DIAGNOSIS — F3110 Bipolar disorder, current episode manic without psychotic features, unspecified: Secondary | ICD-10-CM

## 2014-12-02 DIAGNOSIS — F330 Major depressive disorder, recurrent, mild: Secondary | ICD-10-CM | POA: Diagnosis not present

## 2014-12-02 DIAGNOSIS — F331 Major depressive disorder, recurrent, moderate: Secondary | ICD-10-CM

## 2014-12-02 MED ORDER — CLONAZEPAM 1 MG PO TABS: ORAL_TABLET | ORAL | Status: DC

## 2014-12-02 MED ORDER — VENLAFAXINE HCL ER 150 MG PO CP24: ORAL_CAPSULE | ORAL | Status: DC

## 2014-12-02 MED ORDER — LITHIUM CARBONATE 300 MG PO CAPS: ORAL_CAPSULE | ORAL | Status: DC

## 2014-12-02 MED ORDER — RISPERIDONE MICROSPHERES 50 MG IM SUSR: INTRAMUSCULAR | Status: DC

## 2014-12-02 MED ORDER — PALIPERIDONE ER 1.5 MG PO TB24: 1.5000 mg | ORAL_TABLET | Freq: Every evening | ORAL | Status: DC

## 2014-12-02 MED ORDER — RISPERIDONE MICROSPHERES 50 MG IM SUSR
50.0000 mg | INTRAMUSCULAR | Status: DC
  Administered 2014-12-02: 50 mg via INTRAMUSCULAR

## 2014-12-02 NOTE — Progress Notes (Signed)
Patient ID: Angel [name], male   DOB: August 03, 1955, 59 y.o.   MRN: [id number] D. Patient presented with flat affect, level and pleasant mood and denied any suicidal or homicidal ideations, no auditory or visual hallucinations and no problems with sleep or appetite.  Patient reported doing fine today with no noted concerns.  A. Patient's due Risperdal Consta 50mg /58ml IM injection prepared and given as ordered.  R. Patient tolerated injection without complaint of pain or discomfort and agrees to return in 2 weeks for next due injection.  Patient to see Dr. Casimiro Needle today for evaluation and will call if any problems prior to next appointment

## 2014-12-02 NOTE — Progress Notes (Signed)
[facility] MD Progress Note  12/02/2014 2:44 PM Angel [name]  MRN:  [id number] Subjective:  Feeling good Principal Problem: Bipolar, manic  Diagnosis:  Bipolar , manic Patient Active Problem List   Diagnosis Date Noted  . Hyperglycemia [R73.9] 10/07/2013  . 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 Bipolar I disorder, most recent episode (or current) depressed, unspecified [F31.30] 05/21/2013  . Hepatitis B [B16.9] 03/21/2013  . Esophageal reflux [K21.9] 03/21/2013  . Personal history of colonic polyps [Z86.010] 03/21/2013  . Bipolar disorder, unspecified [F31.9] 09/03/2011   Total Time spent with patient: 30 minutes   Past Medical History:  Past Medical History  Diagnosis Date  . Depression   . Bipolar disorder   . 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 Hepatitis B     Hepatitis E. antigen positive by history, status post treatment with Hepsera and baraclude   . Personal history of colonic polyps     Adenomas polyp 2008 and 2010, 2015     Past Surgical History  Procedure Laterality Date  . Appendectomy    . Colonoscopy    . Upper gastrointestinal endoscopy    . Polypectomy     Family History:  Family History  Problem Relation Age of Onset  . Heart disease Mother   . Diabetes Father   . Kidney failure Father   . Other Sister     GERD  . Colon cancer Neg Hx    Social History:  History  Alcohol Use No     History  Drug Use No    History   Social History  . Marital Status: Single    Spouse Name: N/A  . Number of Children: 0  . Years of Education: N/A   Occupational History  . retired    Social History Main Topics  . Smoking status: Former Smoker    Types: Cigarettes    Quit date: 05/08/2009  . Smokeless tobacco: Never Used  . Alcohol Use: No  . Drug Use: No  . Sexual Activity: Not on file   Other Topics Concern  . Not on file   Social History Narrative   Additional History:    Sleep: Good  Appetite:   Good   Assessment: This patient is seen with his partner care and is doing very well. A few months ago he had a week of depression but that seems to have abated. The patient's mood at this time is good he denies daily depression. He sleeping and eating well. His energy level is mildly reduce which is believed to be related to his liver failure. The patient still enjoys life. He has 3 dogs likes to read and watch TV. He plays jeopardy with success. The patient is calm easy-going has no evidence of psychosis. The patient does no alcohol and uses no drugs. He is active enough in his world and is functioning well. Today we reviewed the idea that he was started on Risperdal Consta is and I am injection many years ago while he was hospitalized. He was hospitalized with seem to be a mild manic episode. The use of anti-psychotic medicine is not clear. It should be noted the patient is on lithium and has a low but therapeutic lithium level at this time. The patient takes Effexor without any problems. Patient recently had blood work that was within normal limits. In essence his kidney function was normal and his lithium level was acceptable. At this time we'll go ahead  and do what we've been talking about which is to take them off the Risperdal injection and will change him actually to [facility] by mouth  Musculoskeletal: Strength & Muscle Tone: within normal limits Gait & Station: normal Patient leans: Right   Psychiatric Specialty Exam: Physical Exam  ROS  There were no vitals taken for this visit.There is no weight on file to calculate BMI.  General Appearance: Casual  Eye Contact::  Good  Speech:  Clear and Coherent  Volume:  Normal  Mood:  NA  Affect:  Congruent  Thought Process:  Goal Directed  Orientation:  Full (Time, Place, and Person)  Thought Content:  WDL  Suicidal Thoughts:  No  Homicidal Thoughts:  No  Memory:  NA  Judgement:  Good  Insight:  Fair  Psychomotor Activity:  Normal   Concentration:  Good  Recall:  Good  Fund of Knowledge:Good  Language: Good  Akathisia:  No  Handed:  Right  AIMS (if indicated):     Assets:  Communication Skills  ADL's:  Intact  Cognition: WNL  Sleep:        Current Medications: Current Outpatient Prescriptions  Medication Sig Dispense Refill  . clonazePAM (KLONOPIN) 1 MG tablet 1 qhs 30 tablet 5  . diphenhydrAMINE (BENADRYL) 25 MG tablet Take 75 mg by mouth at bedtime.    Marland Kitchen lithium carbonate 300 MG capsule 2  qhs 60 capsule 5  . nadolol (CORGARD) 40 MG tablet TAKE 1 TABLET BY MOUTH EVERY DAY 30 tablet 0  . Paliperidone 1.5 MG TB24 Take 1 tablet (1.5 mg total) by mouth Nightly. 30 tablet 5  . pantoprazole (PROTONIX) 40 MG tablet TAKE 1 TABLET BY MOUTH EVERY DAY 30 tablet 0  . risperiDONE microspheres (RISPERDAL CONSTA) 50 MG injection INJECT 2 ML INTO THE MUSCLE EVERY 14 DAYS 2 each 0  . venlafaxine XR (EFFEXOR-XR) 150 MG 24 hr capsule 1 qam 30 capsule 5  . VIREAD 300 MG tablet Take 300 mg by mouth daily.      No current facility-administered medications for this visit.   Facility-Administered Medications Ordered in Other Visits  Medication Dose Route Frequency Provider Last Rate Last Dose  . risperiDONE microspheres (RISPERDAL CONSTA) injection 50 mg  50 mg Intramuscular Q14 Days Norma Fredrickson, MD   50 mg at 12/02/14 1402    Lab Results: No results found for this or any previous visit (from the past 48 hour(s)).  Physical Findings: AIMS:  , ,  ,  ,    CIWA:    COWS:     Treatment Plan Summary: At this time the patient will discontinue his Risperdal Consta injections. He'll begin on Invega 1.5 mg. The patient continue taking his lithium and is Effexor. In essence his primary problem is that of bipolar disorder and is very well controlled at this time. At this time we will assist the patient by not having to come every 2 weeks for an injection but converted to by mouth give him a medicine that is safer for liver  failure. This new agent does not involve liver metabolism and hence we suspect it does not involve any hepatic functioning. It is my hope that in the next 3-6 months possibly we could even discontinue this agent. As long as the patient is stable and takes lithium I think we get away with it. Would every do we'll go slow and will do it with the assistance of his partner [name] who is been involved in this decision making  process. This patient is calm and cooperative not suicidal and doing great.    Medic 1.5 mg. Heal Decision Making:  Established Problem, Stable/Improving (1)     Dametri Ozburn IRVING 12/02/2014, 2:44 PM

## 2014-12-14 ENCOUNTER — Other Ambulatory Visit: Payer: Self-pay | Admitting: *Deleted

## 2014-12-14 MED ORDER — PANTOPRAZOLE SODIUM 40 MG PO TBEC: 40.0000 mg | DELAYED_RELEASE_TABLET | Freq: Every day | ORAL | Status: DC

## 2015-01-19 ENCOUNTER — Telehealth: Payer: Self-pay | Admitting: Internal Medicine

## 2015-01-19 ENCOUNTER — Encounter: Payer: Self-pay | Admitting: Gastroenterology

## 2015-01-19 ENCOUNTER — Ambulatory Visit (INDEPENDENT_AMBULATORY_CARE_PROVIDER_SITE_OTHER): Payer: Self-pay | Admitting: Gastroenterology

## 2015-01-19 VITALS — BP 100/60 | HR 66 | Ht 71.0 in | Wt 264.2 lb

## 2015-01-19 DIAGNOSIS — K7469 Other cirrhosis of liver: Secondary | ICD-10-CM

## 2015-01-19 DIAGNOSIS — Z23 Encounter for immunization: Secondary | ICD-10-CM

## 2015-01-19 NOTE — Addendum Note (Signed)
Addended by: Oda Kilts on: 01/19/2015 03:26 PM   Modules accepted: Orders

## 2015-01-19 NOTE — Patient Instructions (Signed)
You have been scheduled for an endoscopy. Please follow written instructions given to you at your visit today. If you use inhalers (even only as needed), please bring them with you on the day of your procedure. Your physician has requested that you go to www.startemmi.com and enter the access code given to you at your visit today. This web site gives a general overview about your procedure. However, you should still follow specific instructions given to you by our office regarding your preparation for the procedure.  You have been given a FLU Shot today

## 2015-01-19 NOTE — Assessment & Plan Note (Signed)
The patient is stable hepatic function.  2+ varices were seen at endoscopy in March, 2015.  He is undergoing therapy for hepatitis B.    Recommendations #1 continue nadolol #2 surveillance endoscopy #3 continue hepatitis B therapy per Dr. Monica Martinez

## 2015-01-19 NOTE — Telephone Encounter (Signed)
Patient would like to know if he can have a Shingles Shot.  Please advise.

## 2015-01-19 NOTE — Progress Notes (Signed)
      History of Present Illness:  Angel [name] is here for follow-up of hepatitis B and cirrhosis.  He is undergoing therapy at [facility] with viread for hepatitis B.  2+ esophageal varices were seen at endoscopy in March, 2015    Review of Systems: Pertinent positive and negative review of systems were noted in the above HPI section. All other review of systems were otherwise negative.    Current Medications, Allergies, Past Medical History, Past Surgical History, Family History and Social History were reviewed in [place] record  Vital signs were reviewed in today's medical record. Physical Exam: General: Well developed , well nourished, no acute distress Skin: anicteric Head: Normocephalic and atraumatic Eyes:  sclerae anicteric, EOMI Ears: Normal auditory acuity Mouth: No deformity or lesions Lymph Nodes: no lymphadenopathy Lungs: Clear throughout to auscultation Heart: Regular rate and rhythm; no murmurs, rubs or brui: Gastroinestinal:  Soft, non tender and non distended. No masses, hepatosplenomegaly or hernias noted. Normal Bowel sounds Rectal:deferred Musculoskeletal: Symmetrical with no gross deformities  Pulses:  Normal pulses noted Extremities: No clubbing, cyanosis, edema or deformities noted Neurological: Alert oriented x 4, grossly nonfocal Psychological:  Alert and cooperative. Normal mood and affect  See Assessment and Plan under Problem List

## 2015-01-19 NOTE — Addendum Note (Signed)
Addended by: Oda Kilts on: 01/19/2015 03:12 PM   Modules accepted: Orders

## 2015-01-19 NOTE — Telephone Encounter (Signed)
LVM for pt to call back as soon as possible.   RE: pt is able to get the shingles vaccine. Pt needs to contact insurance company to see if they cover that vaccine.

## 2015-01-21 NOTE — Telephone Encounter (Signed)
Ok with me. thanks 

## 2015-02-25 ENCOUNTER — Encounter: Payer: Self-pay | Admitting: Gastroenterology

## 2015-03-04 ENCOUNTER — Ambulatory Visit (INDEPENDENT_AMBULATORY_CARE_PROVIDER_SITE_OTHER): Payer: BLUE CROSS/BLUE SHIELD | Admitting: Psychiatry

## 2015-03-04 ENCOUNTER — Encounter (HOSPITAL_COMMUNITY): Payer: Self-pay | Admitting: Psychiatry

## 2015-03-04 VITALS — BP 118/76 | HR 68 | Ht 70.0 in | Wt 258.0 lb

## 2015-03-04 DIAGNOSIS — F3110 Bipolar disorder, current episode manic without psychotic features, unspecified: Secondary | ICD-10-CM | POA: Diagnosis not present

## 2015-03-04 DIAGNOSIS — F331 Major depressive disorder, recurrent, moderate: Secondary | ICD-10-CM

## 2015-03-04 MED ORDER — PALIPERIDONE ER 1.5 MG PO TB24: 1.5000 mg | ORAL_TABLET | Freq: Every evening | ORAL | Status: DC

## 2015-03-04 MED ORDER — LITHIUM CARBONATE 300 MG PO CAPS: ORAL_CAPSULE | ORAL | Status: DC

## 2015-03-04 MED ORDER — VENLAFAXINE HCL ER 150 MG PO CP24: ORAL_CAPSULE | ORAL | Status: DC

## 2015-03-04 NOTE — Progress Notes (Signed)
[facility] MD Progress Note  03/04/2015 2:44 PM Angel [name]  MRN:  [id number] Subjective:  Feeling great Principal Problem: Bipolar disorder, most recent episode manic Diagnosis:  Bipolar disorder Type 1 most recent episode manic  At this time the patient is doing very well. He seen with his partner.Marland Kitchen His mood is good. He no longer gets Risperdal Consta but instead gets  Invega 1.5 mg at night. He continues taking Effexor. Patient denies daily depression. Once again reviewed his manic episode which occurred 6 years ago. At that time he had a classic episode of mania and it should be noted he had one other episode to that degree just 5 or 6 years earlier. The patient says he's had multiple episodes but it never been as severe as these last 2. His last episode 6 years ago he was extremely for extremely hyper and spoke all the time. Racing thinking was very distractible. He was grandiose and spending a great deal of money and giving away his car. Was very dysfunctional for him. At one point he was hospitalized. The patient has done well taking Risperdal Consta but this patient has hepatitis B and hepatic disease from it. The patient has an MRI that is scheduled for his liver this week. This patient denies auditory or visual hallucinations. He is not suicidal. He denies any chest pain or shortness of breath. Medically he is very stable. He sleeping and eating well. He's got good energy. He is enjoying life. They're supports this patient is very helpful. He claims that the patient is doing very well and is very stable. It is noted however the patient is developing some tardive dyskinesia on the left side of his face. Today he had a  AIMS scale that showed small amount of movement. The patient is concerned about this. The patient denies any anxiety. He denies the use of alcohol or drugs.  r Type 1 Patient Active Problem List   Diagnosis Date Noted  . Hyperglycemia [R73.9] 10/07/2013  . Hepatic cirrhosis ([place])  [K74.60] 10/03/2013  . Bipolar I disorder, most recent episode (or current) depressed, unspecified [F31.30] 05/21/2013  . Hepatitis B [B16.9] 03/21/2013  . Esophageal reflux [K21.9] 03/21/2013  . Personal history of colonic polyps [Z86.010] 03/21/2013  . Bipolar disorder, unspecified ([place]) [F31.9] 09/03/2011   Total Time spent with patient: 30 minutes  Past Psychiatric History:   Past Medical History:  Past Medical History  Diagnosis Date  . Depression   . Bipolar disorder   . 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 Hepatitis B     Hepatitis E. antigen positive by history, status post treatment with Hepsera and baraclude   . Personal history of colonic polyps     Adenomas polyp 2008 and 2010, 2015     Past Surgical History  Procedure Laterality Date  . Appendectomy    . Colonoscopy    . Upper gastrointestinal endoscopy    . Polypectomy     Family History:  Family History  Problem Relation Age of Onset  . Heart disease Mother   . Diabetes Father   . Kidney failure Father   . Other Sister     GERD  . Colon cancer Neg Hx    Family Psychiatric  History:  Social History:  History  Alcohol Use No     History  Drug Use No    Social History   Social History  . Marital Status: Single    Spouse Name: N/A  .  Number of Children: 0  . Years of Education: N/A   Occupational History  . retired    Social History Main Topics  . Smoking status: Former Smoker    Types: Cigarettes    Quit date: 05/08/2009  . Smokeless tobacco: Never Used  . Alcohol Use: No  . Drug Use: No  . Sexual Activity: Not on file   Other Topics Concern  . Not on file   Social History Narrative   Additional Social History:                         Sleep: Good  Appetite:  Good  Current Medications: Current Outpatient Prescriptions  Medication Sig Dispense Refill  . clonazePAM (KLONOPIN) 1 MG tablet 1 qhs 30 tablet 5  . diphenhydrAMINE (BENADRYL) 25 MG tablet Take  75 mg by mouth at bedtime.    Marland Kitchen lithium carbonate 300 MG capsule 2 bid 120 capsule 5  . nadolol (CORGARD) 40 MG tablet TAKE 1 TABLET BY MOUTH EVERY DAY 30 tablet 0  . Paliperidone 1.5 MG TB24 Take 1 tablet (1.5 mg total) by mouth Nightly. 30 tablet 5  . pantoprazole (PROTONIX) 40 MG tablet Take 1 tablet (40 mg total) by mouth daily. 30 tablet 3  . venlafaxine XR (EFFEXOR-XR) 150 MG 24 hr capsule 1 qam 30 capsule 5  . VIREAD 300 MG tablet Take 300 mg by mouth daily.      No current facility-administered medications for this visit.    Lab Results: No results found for this or any previous visit (from the past 48 hour(s)).  Physical Findings: AIMS:  , ,  ,  ,    CIWA:    COWS:     Musculoskeletal: Strength & Muscle Tone: within normal limits Gait & Station: normal Patient leans: Right  Psychiatric Specialty Exam: ROS  There were no vitals taken for this visit.There is no weight on file to calculate BMI.  General Appearance: Casual  Eye Contact::  Good  Speech:  Clear and Coherent  Volume:  Normal  Mood:  Euthymic  Affect:  NA  Thought Process:  Coherent  Orientation:  Full (Time, Place, and Person)  Thought Content:  WDL  Suicidal Thoughts:  No  Homicidal Thoughts:  No  Memory:  NA  Judgement:  Good  Insight:  Good  Psychomotor Activity:  Normal  Concentration:  Good  Recall:  Good  Fund of Knowledge:Good  Language: Good  Akathisia:  No  Handed:  Right  AIMS (if indicated):     Assets:  Desire for Improvement  ADL's:  Intact  Cognition: WNL  Sleep:      Treatment Plan Summary: At this time the patient is doing well. Thorough discussion of the pros and cons of his medications it is evident that he is developing some tardive dyskinesia. I think he is at significant risk for this given that he has an affect of disorder is been taking neuroleptics now for many years. Unfortunately her other choices for bipolar disorder for the most part involve the liver. Depakote and  Tegretol which she's never been on his metabolized through his liver. It is noted the patient is on a low-dose of lithium giving him a blood level of 0.4. Lithium is been an effective medicine for the past he continues taking it for maintenance purposes. I think this is a non-therapeutic level. He actually is been on higher doses in the distant past. Consistent with his wishes  are plans are to increase the lithium from 300 mg 2 at night to taking 2 in the morning and 2 at night and weeks later getting a lithium blood level is other as well as other blood work. We will assess his kidney function as well as getting a TSH. The patient return to see me in about 6 or 7 weeks. Given that he is not having any trouble with the lithium and that his blood level comes back in a therapeutic range I will at that time consider discontinuing his Invega. The patient will continue taking 150 mg of Effexor. I feel comfortable doing this with this patient as he is closely followed by his partner [name] calls any changes take place. Again this patient is not demonstrated any mania for over 6 years. We will cautiously make this change and monitor him closely. The patient and his partner were told to call us if there are any changes at they notice.  Shamirah Ivan, [place] 03/04/2015, 2:44 PM

## 2015-03-10 ENCOUNTER — Encounter: Payer: Self-pay | Admitting: *Deleted

## 2015-03-10 ENCOUNTER — Encounter: Payer: BLUE CROSS/BLUE SHIELD | Admitting: Gastroenterology

## 2015-03-10 NOTE — Progress Notes (Signed)
Patient ID: Angel [name], male   DOB: 1955/07/28, 59 y.o.   MRN: [id number] Pt is scheduled for an EGD this a.m.  Per Dr. Havery Moros, pt does not need an EGD unless he is having any abd pain or dysphagia.  He previously had an EGD in 2015 with Dr. Deatra Ina and Dr. Havery Moros states since he is on a beta blocker for varices, he doesn't need an EGD at this time.  Explained to pt and understanding voiced.  OV made with Dr. Havery Moros on 05-14-15.

## 2015-04-22 ENCOUNTER — Other Ambulatory Visit: Payer: BLUE CROSS/BLUE SHIELD

## 2015-04-23 LAB — COMPLETE METABOLIC PANEL WITH GFR
ALBUMIN: 3.7 g/dL (ref 3.6–5.1)
ALK PHOS: 82 U/L (ref 40–115)
ALT: 26 U/L (ref 9–46)
AST: 26 U/L (ref 10–35)
BUN: 6 mg/dL — ABNORMAL LOW (ref 7–25)
CHLORIDE: 111 mmol/L — AB (ref 98–110)
CO2: 27 mmol/L (ref 20–31)
Calcium: 8.6 mg/dL (ref 8.6–10.3)
Creat: 0.79 mg/dL (ref 0.70–1.25)
GFR, Est African American: >89 mL/min (ref >=60)
GLUCOSE: 79 mg/dL (ref 65–99)
POTASSIUM: 3.9 mmol/L (ref 3.5–5.3)
SODIUM: 141 mmol/L (ref 135–146)
Total Bilirubin: 1 mg/dL (ref 0.2–1.2)
Total Protein: 6.4 g/dL (ref 6.1–8.1)

## 2015-04-23 LAB — LITHIUM LEVEL: Lithium Lvl: 0.6 mEq/L — ABNORMAL LOW (ref 0.80–1.40)

## 2015-04-23 LAB — TSH: TSH: 3.955 u[IU]/mL (ref 0.350–4.500)

## 2015-04-28 ENCOUNTER — Ambulatory Visit (INDEPENDENT_AMBULATORY_CARE_PROVIDER_SITE_OTHER): Payer: BLUE CROSS/BLUE SHIELD | Admitting: Psychiatry

## 2015-04-28 VITALS — BP 125/78 | HR 62 | Ht 70.0 in | Wt 248.2 lb

## 2015-04-28 DIAGNOSIS — F3110 Bipolar disorder, current episode manic without psychotic features, unspecified: Secondary | ICD-10-CM

## 2015-04-28 NOTE — Progress Notes (Signed)
[facility] MD Progress Note  04/28/2015 2:12 PM Angel [name]  MRN:  [id number] Subjective:  Great Principal Problem: Bipolar disorder1 Diagnosis:  Bipolar disorder most recent episode depressed Patient is actually doing fairly well. His lithium level came back 0.6 taking 1200 mg of lithium. We went from 600 mg to 1200. Patient says for the most part his mood is stable. He has noted a little bit of fluctuation and went on for hours where times he felt excited and positive high but was short-lived. He suspects it's related to the holidays. He's having family. He's had episodes of depression but it's only been for hours or he's not showing any significant mood changes. Overall he sleeping and eating well. His thoughts are clear and organized. He's having no racing thinking. Today unfortunately seen alone without his partner but he says [name] says he is doing well. I'm sure his partner was concerned he would be here with. Consistent with our plan the patient was showing some mild evidence of tardive dyskinesia and he has wishes to come off of his antipsychotic medicine. Given that his lithium level is 0.6 at this time I think is reasonable to discontinue his a day. Patient's mood is stable and he'll get another appointment to see me within a month and a half. Patient is agreed to contact me changes take place. The patient drinks no alcohol uses no drugs is never been psychotic. Generally he is functioning quite well. He's looking for to the holidays and usually does well on the holidays. Patient Active Problem List   Diagnosis Date Noted  . Hyperglycemia [R73.9] 10/07/2013  . Hepatic cirrhosis ([place]) [K74.60] 10/03/2013  . Bipolar I disorder, most recent episode (or current) depressed, unspecified [F31.30] 05/21/2013  . Hepatitis B [B16.9] 03/21/2013  . Esophageal reflux [K21.9] 03/21/2013  . Personal history of colonic polyps [Z86.010] 03/21/2013  . Bipolar disorder, unspecified ([place]) [F31.9] 09/03/2011    Total Time spent with patient: 15 minutes  Past Psychiatric History:   Past Medical History:  Past Medical History  Diagnosis Date  . Depression   . Bipolar disorder ([place])   . 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 Hepatitis B     Hepatitis E. antigen positive by history, status post treatment with Hepsera and baraclude   . Personal history of colonic polyps     Adenomas polyp 2008 and 2010, 2015     Past Surgical History  Procedure Laterality Date  . Appendectomy    . Colonoscopy    . Upper gastrointestinal endoscopy    . Polypectomy     Family History:  Family History  Problem Relation Age of Onset  . Heart disease Mother   . Diabetes Father   . Kidney failure Father   . Other Sister     GERD  . Colon cancer Neg Hx    Family Psychiatric  History:  Social History:  History  Alcohol Use No     History  Drug Use No    Social History   Social History  . Marital Status: Single    Spouse Name: N/A  . Number of Children: 0  . Years of Education: N/A   Occupational History  . retired    Social History Main Topics  . Smoking status: Former Smoker    Types: Cigarettes    Quit date: 05/08/2009  . Smokeless tobacco: Never Used  . Alcohol Use: No  . Drug Use: No  . Sexual Activity: Not on file  Other Topics Concern  . Not on file   Social History Narrative   Additional Social History:                         Sleep: Good  Appetite:  Good  Current Medications: Current Outpatient Prescriptions  Medication Sig Dispense Refill  . clonazePAM (KLONOPIN) 1 MG tablet 1 qhs 30 tablet 5  . diphenhydrAMINE (BENADRYL) 25 MG tablet Take 75 mg by mouth at bedtime.    Marland Kitchen lithium carbonate 300 MG capsule 2 bid 120 capsule 5  . nadolol (CORGARD) 40 MG tablet TAKE 1 TABLET BY MOUTH EVERY DAY 30 tablet 0  . Paliperidone 1.5 MG TB24 Take 1 tablet (1.5 mg total) by mouth Nightly. 30 tablet 5  . pantoprazole (PROTONIX) 40 MG tablet Take 1 tablet  (40 mg total) by mouth daily. 30 tablet 3  . venlafaxine XR (EFFEXOR-XR) 150 MG 24 hr capsule 1 qam 30 capsule 5  . VIREAD 300 MG tablet Take 300 mg by mouth daily.      No current facility-administered medications for this visit.    Lab Results: No results found for this or any previous visit (from the past 48 hour(s)).  Physical Findings: AIMS:  , ,  ,  ,    CIWA:    COWS:     Musculoskeletal: Strength & Muscle Tone: within normal limits Gait & Station: normal Patient leans: Right  Psychiatric Specialty Exam: ROS  Blood pressure 125/78, pulse 62, height 5\' 10"  (1.778 m), weight 248 lb 3.2 oz (112.583 kg).Body mass index is 35.61 kg/(m^2).  General Appearance: Negative  Eye Contact::  Good  Speech:  Clear and Coherent  Volume:  Normal  Mood:  Euthymic  Affect:  Negative  Thought Process:  Coherent  Orientation:  Full (Time, Place, and Person)  Thought Content:  WDL  Suicidal Thoughts:  No  Homicidal Thoughts:  No  Memory:  NA  Judgement:  Good  Insight:  Good  Psychomotor Activity:  Normal  Concentration:  NA  Recall:  Good  Fund of Knowledge:Good  Language: Good  Akathisia:  No  Handed:  Right  AIMS (if indicated):     Assets:  Desire for Improvement  ADL's:  Intact  Cognition: WNL  Sleep:      Treatment Plan Summary: At this time we will discontinue his Invega. He'll continue his lithium as is. His blood work was good in that his liver enzymes actually were good. Patient continue taking all his other medications as ordered and return to see me in 5 weeks. There are any changesmay contact with [place] right away. This patient really hasn't had any significant mood changes in many years. I think now is on a reasonable maintenance dose of lithium. Is not suicidal. He denies any significant physical changes.  Najiyah Paris IRVING 04/28/2015, 2:12 PM

## 2015-05-14 ENCOUNTER — Ambulatory Visit: Payer: Self-pay | Admitting: Gastroenterology

## 2015-05-21 ENCOUNTER — Telehealth (HOSPITAL_COMMUNITY): Payer: Self-pay

## 2015-05-21 DIAGNOSIS — F330 Major depressive disorder, recurrent, mild: Secondary | ICD-10-CM

## 2015-05-21 NOTE — Telephone Encounter (Signed)
Fax received from [facility] for a refill on Clonazapam 1mg  tabs 1 po qhs. Last refill was 7/27 #30 x 5 and patient has a f/u appointment on 06/11/2015. Please review and advise, thank you

## 2015-05-24 ENCOUNTER — Telehealth (HOSPITAL_COMMUNITY): Payer: Self-pay

## 2015-05-24 NOTE — Telephone Encounter (Signed)
Patient calling back, he does not have enough Klon

## 2015-05-24 NOTE — Telephone Encounter (Signed)
Medication refill request - Fax received for Clonazepam refill, last written on 12/02/14 + 5 refills.  Patient last evalutated on 04/28/15 and returns next on 06/11/15.

## 2015-05-24 NOTE — Telephone Encounter (Signed)
Patient calling back and he does not have enough Klonapin to last until Dr. Casimiro Needle is in the office. Patients last visit was 04/28/2015 and his f/u is 06/11/2015. He last got medication order on 12/02/2014 - 1mg  1 po qhs #30 x 5. Would like to know if he can get refill now. Please review and advise, thank you

## 2015-05-24 NOTE — Telephone Encounter (Signed)
Telephone message left for patient this nurse received his call for refill request for Klonopin and informed he would be back in the office on 05/26/15.  Requested patient call back if he does not have enough until then as patient was last seen on 04/28/15.

## 2015-05-26 MED ORDER — CLONAZEPAM 1 MG PO TABS: ORAL_TABLET | ORAL | Status: DC

## 2015-05-26 NOTE — Telephone Encounter (Signed)
Met with Dr. Casimiro Needle who approved a new Clonazepam '1mg'$ , one at bedtime order, #30 to last until patient returns 06/11/15.  Called in order to patient's [facility] on [address] in [place] with [place], Education administrator.  Called patient and left a message to inform his Klonopin order had been called into his [employer].

## 2015-06-11 ENCOUNTER — Ambulatory Visit (INDEPENDENT_AMBULATORY_CARE_PROVIDER_SITE_OTHER): Payer: BLUE CROSS/BLUE SHIELD | Admitting: Psychiatry

## 2015-06-11 VITALS — BP 112/71 | HR 60 | Ht 71.0 in | Wt 238.6 lb

## 2015-06-11 DIAGNOSIS — F3130 Bipolar disorder, current episode depressed, mild or moderate severity, unspecified: Secondary | ICD-10-CM

## 2015-06-11 DIAGNOSIS — F331 Major depressive disorder, recurrent, moderate: Secondary | ICD-10-CM

## 2015-06-11 DIAGNOSIS — F330 Major depressive disorder, recurrent, mild: Secondary | ICD-10-CM

## 2015-06-11 MED ORDER — CLONAZEPAM 1 MG PO TABS: ORAL_TABLET | ORAL | Status: DC

## 2015-06-11 MED ORDER — LITHIUM CARBONATE 300 MG PO CAPS: ORAL_CAPSULE | ORAL | Status: DC

## 2015-06-11 MED ORDER — VENLAFAXINE HCL ER 150 MG PO CP24: ORAL_CAPSULE | ORAL | Status: DC

## 2015-06-11 NOTE — Progress Notes (Signed)
[facility] MD Progress Note  06/11/2015 11:22 AM Angel [name]  MRN:  [id number] Subjective:  Feeling great Principal Problem: Bipolar disorder, most recent episode depression Diagnosis:  Bipolar disorder most recent episode depression Today the patient is doing well. He is seen with his partner Angel [name]. His mood is stable and even. He successfully come off of Inve without problems. They describe  short half days where he seems depressed or a little bit euphoric but these are short-lived and I do not believe they're clinically significant. I shared with him if they are persistent and going for days contact us. The patient is sleeping and eating well. He's got good energy. He shows no evidence of psychosis. He physically is doing great. He's lost 20 pounds on his own. I shared with him that it might of been the result coming off his antipsychotic medicine. Nonetheless the patient seems pleased. He shows no evidence of mania he is not irritable and he remains very active. The patient is on disability for bipolar disorder in my opinion should stay just that way. He takes Klonopin at night which helps him sleep. He takes Effexor that is been on for a long time and will continue that. The patient is done well. He is not suicidal not homicidal he is no neurological complaints.  Patient Active Problem List   Diagnosis Date Noted  . Hyperglycemia [R73.9] 10/07/2013  . Hepatic cirrhosis ([place]) [K74.60] 10/03/2013  . Bipolar I disorder, most recent episode (or current) depressed, unspecified [F31.30] 05/21/2013  . Hepatitis B [B16.9] 03/21/2013  . Esophageal reflux [K21.9] 03/21/2013  . Personal history of colonic polyps [Z86.010] 03/21/2013  . Bipolar disorder, unspecified ([place]) [F31.9] 09/03/2011   Total Time spent with patient: 30 minutes  Past Psychiatric History:   Past Medical History:  Past Medical History  Diagnosis Date  . Depression   . Bipolar disorder ([place])   . Cirrhosis of liver without mention of  alcohol   . Hepatitis B     Hepatitis E. antigen positive by history, status post treatment with Hepsera and baraclude   . Personal history of colonic polyps     Adenomas polyp 2008 and 2010, 2015     Past Surgical History  Procedure Laterality Date  . Appendectomy    . Colonoscopy    . Upper gastrointestinal endoscopy    . Polypectomy     Family History:  Family History  Problem Relation Age of Onset  . Heart disease Mother   . Diabetes Father   . Kidney failure Father   . Other Sister     GERD  . Colon cancer Neg Hx    Family Psychiatric  History:  Social History:  History  Alcohol Use No     History  Drug Use No    Social History   Social History  . Marital Status: Single    Spouse Name: N/A  . Number of Children: 0  . Years of Education: N/A   Occupational History  . retired    Social History Main Topics  . Smoking status: Former Smoker    Types: Cigarettes    Quit date: 05/08/2009  . Smokeless tobacco: Never Used  . Alcohol Use: No  . Drug Use: No  . Sexual Activity: Not on file   Other Topics Concern  . Not on file   Social History Narrative   Additional Social History:  Sleep: Good  Appetite:  Good  Current Medications: Current Outpatient Prescriptions  Medication Sig Dispense Refill  . clonazePAM (KLONOPIN) 1 MG tablet 1 qhs 30 tablet 5  . diphenhydrAMINE (BENADRYL) 25 MG tablet Take 75 mg by mouth at bedtime.    Marland Kitchen lithium carbonate 300 MG capsule 2 bid 120 capsule 5  . nadolol (CORGARD) 40 MG tablet TAKE 1 TABLET BY MOUTH EVERY DAY 30 tablet 0  . Paliperidone 1.5 MG TB24 Take 1 tablet (1.5 mg total) by mouth Nightly. 30 tablet 5  . pantoprazole (PROTONIX) 40 MG tablet Take 1 tablet (40 mg total) by mouth daily. 30 tablet 3  . venlafaxine XR (EFFEXOR-XR) 150 MG 24 hr capsule 1 qam 30 capsule 5  . VIREAD 300 MG tablet Take 300 mg by mouth daily.      No current facility-administered medications for this  visit.    Lab Results: No results found for this or any previous visit (from the past 48 hour(s)).  Physical Findings: AIMS:  , ,  ,  ,    CIWA:    COWS:     Musculoskeletal: Strength & Muscle Tone: within normal limits Gait & Station: normal Patient leans: N/A  Psychiatric Specialty Exam: ROS  Blood pressure 112/71, pulse 60, height 5\' 11"  (1.803 m), weight 238 lb 9.6 oz (108.228 kg).Body mass index is 33.29 kg/(m^2).  General Appearance: Casual  Eye Contact:: Good   Speech:  Clear and Coherent  Volume:  Normal  Mood:  Euthymic  Affect:  NA and Appropriate  Thought Process:  Coherent  Orientation:  Full (Time, Place, and Person)  Thought Content:  WDL  Suicidal Thoughts:  No  Homicidal Thoughts:  No  Memory:  NA  Judgement:  Good  Insight:  Good  Psychomotor Activity:  Normal  Concentration:  Good  Recall:  Good  Fund of Knowledge:Good  Language: Good  Akathisia:  No  Handed:  Right  AIMS (if indicated):     Assets:  Desire for Improvement  ADL's:  Intact  Cognition: WNL  Sleep:      Treatment Plan Summary: Today the patient is doing well. He describes it sounds like a little bit of TD and is now but today one testing there is no evidence of tardive dyskinesia. I shared with him whenever he is experiencing likely will go away as time goes on. Certainly in this evaluation he had no movements of his face is now or his hands. The patient is doing very well on lithium blood level about 0.6. Continues taking Klonopin 1 mg which helps him sleep and Effexor which also helps him in terms of no depression. The patient is doing very well and successfully come off his antipsychotic medication. He is partner pleased and agreed to return to see me in 3 months.   Angel Schroeder, MD 06/11/2015, 11:22 AM

## 2015-08-11 ENCOUNTER — Telehealth: Payer: Self-pay | Admitting: *Deleted

## 2015-08-11 NOTE — Telephone Encounter (Signed)
Unable to reach patient at time of pre-visit call. Left message for patient to return call when available.  

## 2015-08-12 ENCOUNTER — Encounter: Payer: Self-pay | Admitting: Family Medicine

## 2015-08-12 ENCOUNTER — Ambulatory Visit (INDEPENDENT_AMBULATORY_CARE_PROVIDER_SITE_OTHER): Payer: BLUE CROSS/BLUE SHIELD | Admitting: Family Medicine

## 2015-08-12 VITALS — BP 110/65 | HR 71 | Temp 98.0°F | Ht 71.0 in | Wt 233.0 lb

## 2015-08-12 DIAGNOSIS — Z13220 Encounter for screening for lipoid disorders: Secondary | ICD-10-CM | POA: Diagnosis not present

## 2015-08-12 DIAGNOSIS — K117 Disturbances of salivary secretion: Secondary | ICD-10-CM | POA: Diagnosis not present

## 2015-08-12 DIAGNOSIS — Z23 Encounter for immunization: Secondary | ICD-10-CM | POA: Diagnosis not present

## 2015-08-12 DIAGNOSIS — Z131 Encounter for screening for diabetes mellitus: Secondary | ICD-10-CM | POA: Diagnosis not present

## 2015-08-12 LAB — LIPID PANEL
Cholesterol: 138 mg/dL (ref 0–200)
HDL: 30.9 mg/dL — ABNORMAL LOW (ref >39.00)
LDL Cholesterol: 87 mg/dL (ref 0–99)
NonHDL: 106.61
Total CHOL/HDL Ratio: 4
Triglycerides: 98 mg/dL (ref 0.0–149.0)
VLDL: 19.6 mg/dL (ref 0.0–40.0)

## 2015-08-12 LAB — HEMOGLOBIN A1C: Hgb A1c MFr Bld: 5.7 % (ref 4.6–6.5)

## 2015-08-12 MED ORDER — IPRATROPIUM BROMIDE 0.03 % NA SOLN: 2.0000 | Freq: Four times a day (QID) | NASAL | Status: DC

## 2015-08-12 NOTE — Progress Notes (Signed)
Angel [name] at [facility] [address] [place], [place] [address] [phone]  Date:  08/12/2015   Name:  Angel [name]   DOB:  June 19, 1955   MRN:  [id number]  PCP:  Lamar Blinks, MD    Chief Complaint: New Patient (Initial Visit)   History of Present Illness:  Angel [name] is a 60 y.o. very pleasant male patient who presents with the following:  Here today as a new pt to establish care.  He has been a pt of Dr. Deatra Ina.  History of Hep B and cirrhosis.   Bipolar disorder and depression  He was dx with bipolar disorder about 10 years ago.  He has been hospitalized 3x but not in a few years.  His psychiatrist is Dr. Casimiro Needle and [facility]  He does have hep B- he goes to [facility] and they are thinking of doing a transplant at some point in the future.  There is a family history of DM He does take protonix for his stomach He is from [place] in [place]   He is not working, he stays home and keeps the house.   Former smoker- quit several years ago  Here today with his [place]- they live together.    He has noted some excessive salivation-this has been present for 5 weeks or so. He tends to want to move his jaw a lot and feels like he needs to swallow a lot.    He did eat some chicken just an hour ago  BP Readings from Last 3 Encounters:  08/12/15 98/70  06/11/15 112/71  04/28/15 125/78    Patient Active Problem List   Diagnosis Date Noted  . Hyperglycemia 10/07/2013  . Hepatic cirrhosis ([place]) 10/03/2013  . Bipolar I disorder, most recent episode (or current) depressed, unspecified 05/21/2013  . Hepatitis B 03/21/2013  . Esophageal reflux 03/21/2013  . Personal history of colonic polyps 03/21/2013  . Bipolar disorder, unspecified ([place]) 09/03/2011    Past Medical History  Diagnosis Date  . Depression   . Bipolar disorder ([place])   . 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 Hepatitis B      Hepatitis E. antigen positive by history, status post treatment with Hepsera and baraclude   . Personal history of colonic polyps     Adenomas polyp 2008 and 2010, 2015   . Hepatitis B carrier     Past Surgical History  Procedure Laterality Date  . Appendectomy    . Colonoscopy    . Upper gastrointestinal endoscopy    . Polypectomy      Social History  Substance Use Topics  . Smoking status: Former Smoker    Types: Cigarettes    Quit date: 05/08/2009  . Smokeless tobacco: Never Used  . Alcohol Use: No    Family History  Problem Relation Age of Onset  . Heart disease Mother   . Diabetes Father   . Kidney failure Father   . Other Sister     GERD  . Colon cancer Neg Hx     Allergies  Allergen Reactions  . Aripiprazole Other (See Comments)    Tardive Dyskinesia  . Abilify [Aripiprazole]   . Lamictal [Lamotrigine] Rash  . Lexapro [Escitalopram Oxalate] Other (See Comments)    Doesn't Work    Medication list has been reviewed and updated.  Current Outpatient Prescriptions on File Prior to Visit  Medication Sig Dispense Refill  . clonazePAM (  KLONOPIN) 1 MG tablet 1 qhs 30 tablet 5  . diphenhydrAMINE (BENADRYL) 25 MG tablet Take 75 mg by mouth at bedtime.    Marland Kitchen lithium carbonate 300 MG capsule 2 bid 120 capsule 5  . nadolol (CORGARD) 40 MG tablet TAKE 1 TABLET BY MOUTH EVERY DAY 30 tablet 0  . pantoprazole (PROTONIX) 40 MG tablet Take 1 tablet (40 mg total) by mouth daily. 30 tablet 3  . venlafaxine XR (EFFEXOR-XR) 150 MG 24 hr capsule 1 qam 30 capsule 5  . VIREAD 300 MG tablet Take 300 mg by mouth daily.     . Paliperidone 1.5 MG TB24 Take 1 tablet (1.5 mg total) by mouth Nightly. (Patient not taking: Reported on 08/12/2015) 30 tablet 5   No current facility-administered medications on file prior to visit.    Review of Systems:  As per HPI- otherwise negative.   Physical Examination: Filed Vitals:   08/12/15 1141  BP: 98/70  Pulse: 71  Temp: 98 F (36.7 C)    Filed Vitals:   08/12/15 1141  Height: 5\' 11"  (1.803 m)  Weight: 233 lb (105.688 kg)   Body mass index is 32.51 kg/(m^2). Ideal Body Weight: Weight in (lb) to have BMI = 25: 178.9  GEN: WDWN, NAD, Non-toxic, A & O x 3, obese, looks well. Moves mouth in a tardive dyskinsia type way HEENT: Atraumatic, Normocephalic. Neck supple. No masses, No LAD. Ears and Nose: No external deformity. CV: RRR, No M/G/R. No JVD. No thrill. No extra heart sounds. PULM: CTA B, no wheezes, crackles, rhonchi. No retractions. No resp. distress. No accessory muscle use. ABD: S, ND, +BS. No rebound. No HSM.  Obese belly EXTR: No c/c/e NEURO Normal gait.  PSYCH: Normally interactive. Conversant. Not depressed or anxious appearing.  Calm demeanor.    Assessment and Plan: Screening for hyperlipidemia - Plan: Lipid panel  Screening for diabetes mellitus - Plan: Hemoglobin A1c  Immunization due - Plan: Varicella-zoster vaccine subcutaneous  Excessive salivation - Plan: ipratropium (ATROVENT) 0.03 % nasal spray  Lipid panel and A1c today He has complaint of excessive salivation but I think he may actually have antipsychotic associated mouth movements/ tardive dyskinesia.  Asked him to call his psychiatrist about this issue.  He is no longer on any antipsychotics.   Will try atrovent nasal to see if it may help with his sensation of excessive salivation zostavax today Plan to recheck in 6 months    Signed Lamar Blinks, MD

## 2015-08-12 NOTE — Progress Notes (Signed)
Pre visit review using our clinic review tool, if applicable. No additional management support is needed unless otherwise documented below in the visit note. 

## 2015-08-12 NOTE — Patient Instructions (Signed)
You got your shingles vaccine today Please go to lab for a blood draw and I will be in touch with your results asap  Please give your psychiatrist a call and let him know about your mouth movements  Try the atrovent nasal and see if it may help with your excessive salivation  Please come and see me in about 6 months to check in and take care!

## 2015-09-08 ENCOUNTER — Ambulatory Visit (INDEPENDENT_AMBULATORY_CARE_PROVIDER_SITE_OTHER): Payer: BLUE CROSS/BLUE SHIELD | Admitting: Psychiatry

## 2015-09-08 VITALS — BP 101/67 | HR 64 | Wt 234.2 lb

## 2015-09-08 DIAGNOSIS — F330 Major depressive disorder, recurrent, mild: Secondary | ICD-10-CM

## 2015-09-08 DIAGNOSIS — F331 Major depressive disorder, recurrent, moderate: Secondary | ICD-10-CM

## 2015-09-08 DIAGNOSIS — F3130 Bipolar disorder, current episode depressed, mild or moderate severity, unspecified: Secondary | ICD-10-CM

## 2015-09-08 DIAGNOSIS — F319 Bipolar disorder, unspecified: Secondary | ICD-10-CM

## 2015-09-08 MED ORDER — LITHIUM CARBONATE 300 MG PO CAPS: ORAL_CAPSULE | ORAL | Status: DC

## 2015-09-08 MED ORDER — VENLAFAXINE HCL ER 150 MG PO CP24: ORAL_CAPSULE | ORAL | Status: DC

## 2015-09-08 MED ORDER — CLONAZEPAM 1 MG PO TABS: ORAL_TABLET | ORAL | Status: DC

## 2015-09-08 NOTE — Progress Notes (Signed)
Patient ID: Angel [name], male   DOB: [birth year], 60 y.o.   MRN: [id number] [facility] MD Progress Note  09/08/2015 3:25 PM Angel [name]  MRN:  [id number] Subjective:  Feeling great Principal Problem: Bipolar disorder, most recent episode depression Diagnosis:  Bipolar disorder most recent episode depression Today the patient is seen with his partner Angel [name]. Patient's only complaint is that he is having a puckering sensation in his mouth. It's continuous and I can see it. It's The last month or 2. This is a patient who just came off off Invega 2 months ago. I shared with them that was probably withdrawal tardive dyskinesia but is suggestively had a neurological evaluation. They completely agreed. Emotionally the patient is doing extremely well. He is calm and pleasant. He shows no evidence of mania. He is not irritable. He is not depressed. He is sleeping and eating quite well. He's got good energy. He is happy with life. Gets on the computer a lot. He has pets that he loves a lot. The patient is no evidence of psychosis. He is outgoing and energetic. He is no neurological complaints. His only complaint seems to be this puckering motion of his mouth. The patient will continue taking lithium. His last lithium level was acceptable. He'll also continue on Effexor and Klonopin. The patient is not oversedated. He's doing very well. He is not suicidal. Total Time spent with patient: 30 minutes  Past Psychiatric History:   Past Medical History:  Past Medical History  Diagnosis Date  . Depression   . Bipolar disorder ([place])   . 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 Hepatitis B     Hepatitis E. antigen positive by history, status post treatment with Hepsera and baraclude   . Personal history of colonic polyps     Adenomas polyp 2008 and 2010, 2015   . Hepatitis B carrier     Past Surgical History  Procedure Laterality Date  . Appendectomy    . Colonoscopy    . Upper gastrointestinal endoscopy     . Polypectomy     Family History:  Family History  Problem Relation Age of Onset  . Heart disease Mother   . Diabetes Father   . Kidney failure Father   . Other Sister     GERD  . Colon cancer Neg Hx    Family Psychiatric  History:  Social History:  History  Alcohol Use No     History  Drug Use No    Social History   Social History  . Marital Status: Single    Spouse Name: N/A  . Number of Children: 0  . Years of Education: N/A   Occupational History  . retired    Social History Main Topics  . Smoking status: Former Smoker    Types: Cigarettes    Quit date: 05/08/2009  . Smokeless tobacco: Never Used  . Alcohol Use: No  . Drug Use: No  . Sexual Activity: Not on file   Other Topics Concern  . Not on file   Social History Narrative   Additional Social History:                         Sleep: Good  Appetite:  Good  Current Medications: Current Outpatient Prescriptions  Medication Sig Dispense Refill  . clonazePAM (KLONOPIN) 1 MG tablet 1 qhs 30 tablet 5  . diphenhydrAMINE (BENADRYL) 25 MG tablet Take 75 mg by mouth at bedtime.    Marland Kitchen  ipratropium (ATROVENT) 0.03 % nasal spray Place 2 sprays into the nose 4 (four) times daily. 30 mL 6  . lithium carbonate 300 MG capsule 2 bid 120 capsule 5  . nadolol (CORGARD) 40 MG tablet TAKE 1 TABLET BY MOUTH EVERY DAY 30 tablet 0  . Paliperidone 1.5 MG TB24 Take 1 tablet (1.5 mg total) by mouth Nightly. (Patient not taking: Reported on 08/12/2015) 30 tablet 5  . pantoprazole (PROTONIX) 40 MG tablet Take 1 tablet (40 mg total) by mouth daily. 30 tablet 3  . venlafaxine XR (EFFEXOR-XR) 150 MG 24 hr capsule 1 qam 30 capsule 5  . VIREAD 300 MG tablet Take 300 mg by mouth daily.      No current facility-administered medications for this visit.    Lab Results: No results found for this or any previous visit (from the past 48 hour(s)).  Physical Findings: AIMS:  , ,  ,  ,    CIWA:    COWS:      Musculoskeletal: Strength & Muscle Tone: within normal limits Gait & Station: normal Patient leans: N/A  Psychiatric Specialty Exam: ROS  Blood pressure 101/67, pulse 64, weight 234 lb 3.2 oz (106.232 kg).Body mass index is 32.68 kg/(m^2).  General Appearance: Casual  Eye Contact:: Good   Speech:  Clear and Coherent  Volume:  Normal  Mood:  Euthymic  Affect:  NA and Appropriate  Thought Process:  Coherent  Orientation:  Full (Time, Place, and Person)  Thought Content:  WDL  Suicidal Thoughts:  No  Homicidal Thoughts:  No  Memory:  NA  Judgement:  Good  Insight:  Good  Psychomotor Activity:  Normal  Concentration:  Good  Recall:  Good  Fund of Knowledge:Good  Language: Good  Akathisia:  No  Handed:  Right  AIMS (if indicated):     Assets:  Desire for Improvement  ADL's:  Intact  Cognition: WNL  Sleep:      Treatment Plan Summary: At this time the patient is doing well. He shows no evidence of mania or depression. He is not psychotic. He is functioning very well. He does have a puckering motion in his lips and I suspected withdrawal part of dyskinesia but for now we'll go ahead and refer him to Angel [name] at the [facility]. For now the patient will continue taking all the medication she's taking. His last blood work was acceptable. He takes lithium small dose of Klonopin and Effexor. He shows no other neurological symptoms. He denies chest pain shortness of breath. Patient is having good medical follow-up. He she'll return to see me in 5 months. Angel Schroeder, MD 09/08/2015, 3:25 PM

## 2015-11-08 ENCOUNTER — Ambulatory Visit: Payer: Self-pay | Admitting: Neurology

## 2015-11-08 DIAGNOSIS — Z029 Encounter for administrative examinations, unspecified: Secondary | ICD-10-CM

## 2016-01-03 DIAGNOSIS — K7469 Other cirrhosis of liver: Secondary | ICD-10-CM | POA: Diagnosis not present

## 2016-01-03 DIAGNOSIS — B181 Chronic viral hepatitis B without delta-agent: Secondary | ICD-10-CM | POA: Diagnosis not present

## 2016-01-03 DIAGNOSIS — Z6833 Body mass index (BMI) 33.0-33.9, adult: Secondary | ICD-10-CM | POA: Diagnosis not present

## 2016-01-27 ENCOUNTER — Telehealth (HOSPITAL_COMMUNITY): Payer: Self-pay

## 2016-01-27 NOTE — Telephone Encounter (Signed)
Patient is calling because he is having trouble sleeping. Patient called last week and I advised him to try melatonin, patient said he tried and it worked some, but he is still not sleeping 8 hours. Patient said he is also using 2 klonopin at night and will run out early. Patient has a follow up on 10/4, please review and advise, thank you

## 2016-02-04 MED ORDER — TRAZODONE HCL 50 MG PO TABS: 50.0000 mg | ORAL_TABLET | Freq: Every day | ORAL | 2 refills | Status: DC

## 2016-02-04 NOTE — Telephone Encounter (Signed)
Per Dr. Casimiro Needle an order for Trazodone 50 mg was sent to patients pharmacy -patient was called and informed.

## 2016-02-09 ENCOUNTER — Encounter (HOSPITAL_COMMUNITY): Payer: Self-pay | Admitting: Psychiatry

## 2016-02-09 ENCOUNTER — Ambulatory Visit (INDEPENDENT_AMBULATORY_CARE_PROVIDER_SITE_OTHER): Payer: BLUE CROSS/BLUE SHIELD | Admitting: Psychiatry

## 2016-02-09 VITALS — BP 130/78 | HR 67 | Ht 70.5 in | Wt 235.0 lb

## 2016-02-09 DIAGNOSIS — F330 Major depressive disorder, recurrent, mild: Secondary | ICD-10-CM

## 2016-02-09 DIAGNOSIS — F3110 Bipolar disorder, current episode manic without psychotic features, unspecified: Secondary | ICD-10-CM

## 2016-02-09 MED ORDER — TRAZODONE HCL 50 MG PO TABS: 50.0000 mg | ORAL_TABLET | Freq: Every day | ORAL | 3 refills | Status: DC

## 2016-02-09 MED ORDER — LITHIUM CARBONATE 300 MG PO CAPS: ORAL_CAPSULE | ORAL | 5 refills | Status: DC

## 2016-02-09 MED ORDER — CLONAZEPAM 1 MG PO TABS: ORAL_TABLET | ORAL | 5 refills | Status: DC

## 2016-02-09 NOTE — Progress Notes (Signed)
Patient ID: Angel [name], male   DOB: [birth year], 60 y.o.   MRN: [id number] [facility] MD Progress Note  02/09/2016 3:18 PM Angel [name]  MRN:  [id number] Subjective:  Feeling great Principal Problem: Bipolar disorder, most recent episode depression At this time the patient is doing well. He seen with his partner [name]. The patient over the last few months has however had episodes of hypomania. He's had to clear episodes. They're characterized by significant euphoria hyperverbal behavior and a distinct reduction in the need to sleep. He was never really grandiose and fortunately he did not do any dangerous risky behavior. Psych clear if he was distractible area the patient had these episodes in the last approximately 4-5 days. In the past he's had significant mania that caused him great deal of distress but these were not those. There are clearly evidence of hypomania. 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 This very small this time but it still is bothersome to the patient didn't think that this could impact get worse. I fear it might. At this time it is not clear if he needs any big adjustment in his medicine. His lithium level was normal taking 600 mg twice a day. The patient continues taking Klonopin 1 mg at night but during this. He had a take 2 mg. At this time we'll go ahead and prescribe him 2 mg orally will only take 1 mg. The patient will continue taking Effexor as prescribed. The patient denies any depression at all. He at this time is no euphoria or irritability. Generally has good energy. Generally is very positive and agrees to hold 1 continue his medicines but we'll watch him closely. If in fact things change in his mania recurred they will contact us and we'll go ahead and start another mood stabilizer probably that of Lamictal. I will attempt to avoid antipsychotic medicines.  Past Psychiatric  History:   Past Medical History:  Past Medical History:  Diagnosis Date  . Bipolar disorder ([place])   . 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 Depression   . Hepatitis B    Hepatitis E. antigen positive by history, status post treatment with Hepsera and baraclude   . Hepatitis B carrier ([place])   . Personal history of colonic polyps    Adenomas polyp 2008 and 2010, 2015     Past Surgical History:  Procedure Laterality Date  . APPENDECTOMY    . COLONOSCOPY    . POLYPECTOMY    . UPPER GASTROINTESTINAL ENDOSCOPY     Family History:  Family History  Problem Relation Age of Onset  . Heart disease Mother   . Diabetes Father   . Kidney failure Father   . Other Sister     GERD  . Colon cancer Neg Hx    Family Psychiatric  History:  Social History:  History  Alcohol Use No     History  Drug Use No    Social History   Social History  . Marital status: Single    Spouse name: N/A  . Number of children: 0  . Years of education: N/A   Occupational History  . retired Not Employed   Social History Main Topics  . Smoking status: Former Smoker    Types: Cigarettes    Quit date: 05/08/2009  . Smokeless tobacco: Never Used  . Alcohol use No  . Drug use: No  .  Sexual activity: Not Asked   Other Topics Concern  . None   Social History Narrative  . None   Additional Social History:                         Sleep: Good  Appetite:  Good  Current Medications: Current Outpatient Prescriptions  Medication Sig Dispense Refill  . clonazePAM (KLONOPIN) 1 MG tablet 2  qhs 60 tablet 5  . diphenhydrAMINE (BENADRYL) 25 MG tablet Take 75 mg by mouth at bedtime.    Marland Kitchen ipratropium (ATROVENT) 0.03 % nasal spray Place 2 sprays into the nose 4 (four) times daily. 30 mL 6  . lithium carbonate 300 MG capsule 2 bid 120 capsule 5  . nadolol (CORGARD) 40 MG tablet TAKE 1 TABLET BY MOUTH EVERY DAY 30 tablet 0  . Paliperidone 1.5 MG TB24 Take 1 tablet (1.5 mg total) by  mouth Nightly. (Patient not taking: Reported on 08/12/2015) 30 tablet 5  . pantoprazole (PROTONIX) 40 MG tablet Take 1 tablet (40 mg total) by mouth daily. 30 tablet 3  . traZODone (DESYREL) 50 MG tablet Take 1 tablet (50 mg total) by mouth at bedtime. 30 tablet 3  . venlafaxine XR (EFFEXOR-XR) 150 MG 24 hr capsule 1 qam 30 capsule 5  . VIREAD 300 MG tablet Take 300 mg by mouth daily.      No current facility-administered medications for this visit.     Lab Results: No results found for this or any previous visit (from the past 48 hour(s)).  Physical Findings: AIMS:  , ,  ,  ,    CIWA:    COWS:     Musculoskeletal: Strength & Muscle Tone: within normal limits Gait & Station: normal Patient leans: N/A  Psychiatric Specialty Exam: ROS  Blood pressure 130/78, pulse 67, height 5' 10.5" (1.791 m), weight 235 lb (106.6 kg).Body mass index is 33.24 kg/m.  General Appearance: Casual  Eye Contact:: Good   Speech:  Clear and Coherent  Volume:  Normal  Mood:  Euthymic  Affect:  NA and Appropriate  Thought Process:  Coherent  Orientation:  Full (Time, Place, and Person)  Thought Content:  WDL  Suicidal Thoughts:  No  Homicidal Thoughts:  No  Memory:  NA  Judgement:  Good  Insight:  Good  Psychomotor Activity:  Normal  Concentration:  Good  Recall:  Good  Fund of Knowledge:Good  Language: Good  Akathisia:  No  Handed:  Right  AIMS (if indicated):     Assets:  Desire for Improvement  ADL's:  Intact  Cognition: WNL  Sleep:      Treatment Plan Summary: 02/09/2016, 3:18 PM This patient clearly has a diagnosis of bipolar disorder. Unfortunately he missed his neurology appointment for an evaluation of his movements. The patient and I are both fairly certain this is due to his exposure to neuroleptics. He said he didn't have it before he started on [place]. At this time the patient is very stable. At this time the patient is medically healthy. He got a good report from his  hepatologist about his liver disease. He does apparently have some degree of cirrhosis but his liver enzymes and his blood work came back improved. The patient is very happy about this outcome. The patient has no kidney disease. Is no heart disease year generally the patient is physically healthy other than this issue of having cirrhosis. He is asymptomatic at this time. Patient will continue  taking a significant dose of lithium and is Effexor and will go ahead and increase his Klonopin to taking one or 2 mg at night. The patient was seen again in 2 and half months for a 15 minute visit to see these having more episodes of hypomania if they do recur to go ahead over the phone and begin him on Lamictal.

## 2016-02-14 ENCOUNTER — Ambulatory Visit: Payer: Self-pay | Admitting: Family Medicine

## 2016-03-27 ENCOUNTER — Ambulatory Visit (INDEPENDENT_AMBULATORY_CARE_PROVIDER_SITE_OTHER): Payer: BLUE CROSS/BLUE SHIELD | Admitting: Behavioral Health

## 2016-03-27 DIAGNOSIS — Z23 Encounter for immunization: Secondary | ICD-10-CM | POA: Diagnosis not present

## 2016-04-05 ENCOUNTER — Other Ambulatory Visit (HOSPITAL_COMMUNITY): Payer: Self-pay | Admitting: Psychiatry

## 2016-04-05 DIAGNOSIS — K766 Portal hypertension: Secondary | ICD-10-CM | POA: Diagnosis not present

## 2016-04-05 DIAGNOSIS — K7689 Other specified diseases of liver: Secondary | ICD-10-CM | POA: Diagnosis not present

## 2016-04-05 DIAGNOSIS — F331 Major depressive disorder, recurrent, moderate: Secondary | ICD-10-CM

## 2016-04-05 DIAGNOSIS — K7469 Other cirrhosis of liver: Secondary | ICD-10-CM | POA: Diagnosis not present

## 2016-04-07 NOTE — Telephone Encounter (Signed)
A new one time order for patient's Venlafaxine XR e-scribed to patient's [facility] per Dr. Casimiro Needle approval.  Patient returns for eval 05/04/16.

## 2016-05-03 ENCOUNTER — Encounter (HOSPITAL_COMMUNITY): Payer: Self-pay | Admitting: Psychiatry

## 2016-05-03 ENCOUNTER — Ambulatory Visit (INDEPENDENT_AMBULATORY_CARE_PROVIDER_SITE_OTHER): Payer: BLUE CROSS/BLUE SHIELD | Admitting: Psychiatry

## 2016-05-03 VITALS — BP 116/64 | HR 65 | Ht 70.5 in | Wt 224.8 lb

## 2016-05-03 DIAGNOSIS — Z841 Family history of disorders of kidney and ureter: Secondary | ICD-10-CM

## 2016-05-03 DIAGNOSIS — F331 Major depressive disorder, recurrent, moderate: Secondary | ICD-10-CM | POA: Diagnosis not present

## 2016-05-03 DIAGNOSIS — F3110 Bipolar disorder, current episode manic without psychotic features, unspecified: Secondary | ICD-10-CM | POA: Diagnosis not present

## 2016-05-03 DIAGNOSIS — Z87891 Personal history of nicotine dependence: Secondary | ICD-10-CM

## 2016-05-03 DIAGNOSIS — Z8249 Family history of ischemic heart disease and other diseases of the circulatory system: Secondary | ICD-10-CM

## 2016-05-03 DIAGNOSIS — Z833 Family history of diabetes mellitus: Secondary | ICD-10-CM | POA: Diagnosis not present

## 2016-05-03 DIAGNOSIS — Z79899 Other long term (current) drug therapy: Secondary | ICD-10-CM

## 2016-05-03 DIAGNOSIS — F330 Major depressive disorder, recurrent, mild: Secondary | ICD-10-CM

## 2016-05-03 MED ORDER — TRAZODONE HCL 50 MG PO TABS: 50.0000 mg | ORAL_TABLET | Freq: Every day | ORAL | 3 refills | Status: DC

## 2016-05-03 MED ORDER — LITHIUM CARBONATE 300 MG PO CAPS: ORAL_CAPSULE | ORAL | 5 refills | Status: DC

## 2016-05-03 MED ORDER — CLONAZEPAM 1 MG PO TABS: ORAL_TABLET | ORAL | 5 refills | Status: DC

## 2016-05-03 MED ORDER — VENLAFAXINE HCL ER 150 MG PO CP24: 150.0000 mg | ORAL_CAPSULE | Freq: Every morning | ORAL | 0 refills | Status: DC

## 2016-05-03 NOTE — Progress Notes (Signed)
Patient ID: Angel [name], male   DOB: 28-May-1955, 60 y.o.   MRN: [id number] [facility] MD Progress Note  05/03/2016 3:17 PM [name]  MRN:  [id number] Subjective:  Feeling great Principal Problem: Bipolar disorder, most recent episode depression  Today the patient is seen with his partner, [name].The patient's actually doing fairly well. The patient denies any evidence of mania at this time. Once again over the last 6-8 weeks he's had a one or 2 isolated episodes alone for days of manic symptoms. Never got them in trouble and therefore I believe it's hypomania. 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 He is very sensitive about anything involving his liver. His kidneys seem to be fine. At first were going to offer her Lamictal but they give a vague story of being on Lamictal in the past and having a rash from. Is not a good story in that they think he could've been poison ivy as well. It only occurred on his face. Nonetheless we'll try to avoid it if we can. The patient is never been on Tegretol either. The possibility of using Tegretol is not out of the question other than the fact that going to try to make contact with people that are handling his liver disease. His contact number is an individual name [name] his number is [phone]. For now however I think it's reasonable to adjust up his lithium. His last lithium level was 0.6 on 1200 mg. Patient says at this time he sleeping and eating well. He does not have racing thoughts. He denies any evidence of euphoria or irritability. He is not depressed. He claims that the Effexor is been very helpful for his depression and is frightened to come off of it. The possibility of lowering it should not be out of the question in even possibly reduce reducing it to the point where we discontinue it. Nonetheless hour SPECT concerns about liver disease and about coming off the Effexor. His hepatologist is Dr. [name].  Past  Psychiatric History:   Past Medical History:  Past Medical History:  Diagnosis Date  . Bipolar disorder ([place])   . 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 Depression   . Hepatitis B    Hepatitis E. antigen positive by history, status post treatment with Hepsera and baraclude   . Hepatitis B carrier ([place])   . Personal history of colonic polyps    Adenomas polyp 2008 and 2010, 2015     Past Surgical History:  Procedure Laterality Date  . APPENDECTOMY    . COLONOSCOPY    . POLYPECTOMY    . UPPER GASTROINTESTINAL ENDOSCOPY     Family History:  Family History  Problem Relation Age of Onset  . Heart disease Mother   . Diabetes Father   . Kidney failure Father   . Other Sister     GERD  . Colon cancer Neg Hx    Family Psychiatric  History:  Social History:  History  Alcohol Use No     History  Drug Use No    Social History   Social History  . Marital status: Single    Spouse name: N/A  . Number of children: 0  . Years of education: N/A   Occupational History  . retired Not Employed   Social History Main Topics  . Smoking status: Former Smoker    Types: Cigarettes    Quit date: 05/08/2009  . Smokeless tobacco: Never Used  .  Alcohol use No  . Drug use: No  . Sexual activity: Not Currently   Other Topics Concern  . None   Social History Narrative  . None   Additional Social History:                         Sleep: Good  Appetite:  Good  Current Medications: Current Outpatient Prescriptions  Medication Sig Dispense Refill  . clonazePAM (KLONOPIN) 1 MG tablet 2  qhs 60 tablet 5  . diphenhydrAMINE (BENADRYL) 25 MG tablet Take 75 mg by mouth at bedtime.    Marland Kitchen lithium carbonate 300 MG capsule 2  qam   3 qhs 150 capsule 5  . Melatonin 10 MG SUBL Place 10 mg under the tongue at bedtime.    . nadolol (CORGARD) 40 MG tablet TAKE 1 TABLET BY MOUTH EVERY DAY 30 tablet 0  . pantoprazole (PROTONIX) 40 MG tablet Take 1 tablet (40 mg total) by  mouth daily. 30 tablet 3  . traZODone (DESYREL) 50 MG tablet Take 1 tablet (50 mg total) by mouth at bedtime. 30 tablet 3  . venlafaxine XR (EFFEXOR-XR) 150 MG 24 hr capsule Take 1 capsule (150 mg total) by mouth every morning. 30 capsule 0  . VIREAD 300 MG tablet Take 300 mg by mouth daily.     Marland Kitchen ipratropium (ATROVENT) 0.03 % nasal spray Place 2 sprays into the nose 4 (four) times daily. (Patient not taking: Reported on 05/03/2016) 30 mL 6   No current facility-administered medications for this visit.     Lab Results: No results found for this or any previous visit (from the past 48 hour(s)).  Physical Findings: AIMS:  , ,  ,  ,    CIWA:    COWS:     Musculoskeletal: Strength & Muscle Tone: within normal limits Gait & Station: normal Patient leans: N/A  Psychiatric Specialty Exam: ROS  Blood pressure 116/64, pulse 65, height 5' 10.5" (1.791 m), weight 224 lb 12.8 oz (102 kg).Body mass index is 31.8 kg/m.  General Appearance: Casual  Eye Contact:: Good   Speech:  Clear and Coherent  Volume:  Normal  Mood:  Euthymic  Affect:  NA and Appropriate  Thought Process:  Coherent  Orientation:  Full (Time, Place, and Person)  Thought Content:  WDL  Suicidal Thoughts:  No  Homicidal Thoughts:  No  Memory:  NA  Judgement:  Good  Insight:  Good  Psychomotor Activity:  Normal  Concentration:  Good  Recall:  Good  Fund of Knowledge:Good  Language: Good  Akathisia:  No  Handed:  Right  AIMS (if indicated):     Assets:  Desire for Improvement  ADL's:  Intact  Cognition: WNL  Sleep:      Treatment Plan Summary: 05/03/2016,  At this time the patient is doing well. We will go ahead and increase his lithium to taking 600 mg in the morning and 900 mg at night. In one week 11 AM lithium blood level. The patient will no*call us if there are problems. He'll return to see [place] in 6 weeks. It is noted that he has a small amount of residual tardive dyskinesia. For this reason I'm hesitant  to start another neuroleptic. I think the possibility of adding Tegretol is not out of the question but first like to clear it to his medical providers. 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 This patient is not suicidal. He denies any other physical complaints. At this time he'll continue taking Effexor 150 mg every morning, trazodone 50 mg at night and Klonopin 1 mg one or 2 at night.

## 2016-05-30 ENCOUNTER — Other Ambulatory Visit (HOSPITAL_COMMUNITY): Payer: Self-pay | Admitting: Psychiatry

## 2016-05-30 DIAGNOSIS — F331 Major depressive disorder, recurrent, moderate: Secondary | ICD-10-CM

## 2016-06-05 ENCOUNTER — Other Ambulatory Visit (HOSPITAL_COMMUNITY): Payer: Self-pay

## 2016-06-05 DIAGNOSIS — F331 Major depressive disorder, recurrent, moderate: Secondary | ICD-10-CM

## 2016-06-05 MED ORDER — VENLAFAXINE HCL ER 150 MG PO CP24: 150.0000 mg | ORAL_CAPSULE | Freq: Every morning | ORAL | 0 refills | Status: DC

## 2016-06-08 ENCOUNTER — Telehealth (HOSPITAL_COMMUNITY): Payer: Self-pay

## 2016-06-08 DIAGNOSIS — F331 Major depressive disorder, recurrent, moderate: Secondary | ICD-10-CM | POA: Diagnosis not present

## 2016-06-08 DIAGNOSIS — F3110 Bipolar disorder, current episode manic without psychotic features, unspecified: Secondary | ICD-10-CM

## 2016-06-08 LAB — LITHIUM LEVEL: Lithium Lvl: 1.6 mmol/L (ref 0.80–1.40)

## 2016-06-08 NOTE — Telephone Encounter (Signed)
Results - Patient called back after getting this nurse's message this date to return call as soon as possible and to stop all lithium medication at this time.  Assessed patient's status with high lithium level as he denied all symptoms but stated he had notices some increased voiding.  Instructed patient to stop all his Lithium, not to take any more until after he goes for reordered lab work to be done on Monday 06/12/16 and then we would follow up with patient and give instruction how to proceed, if Dr. Casimiro Needle wants to restart at a lower dosage or not.  Patient verbalized back to this nurse understanding to stop lithium medication at this time, to get labs redone on 06/12/16 and then to speak with our office as to how to proceed before taking any more medication.  Patient stated understanding and also called patient back again and left message to remind if he had any worsening of symptoms to contact our office or to come in for an emergency evaluation. Patient stable at this time and prior to hanging up reiterated to this nurse he understood instructions and will call back as needed.

## 2016-06-08 NOTE — Telephone Encounter (Signed)
Medication problem - Attempted to reach patient at both phone numbers available, [phone] and [phone] to inform to stop his Lithium until Monday 06/12/16 and to have lab work redone that date per Dr. Casimiro Needle due to call from [facility] today with critical value of 1.6 mmol/L.  Will continue attempts to reach patient with instructions.

## 2016-06-12 ENCOUNTER — Other Ambulatory Visit (HOSPITAL_COMMUNITY): Payer: Self-pay

## 2016-06-12 DIAGNOSIS — F3110 Bipolar disorder, current episode manic without psychotic features, unspecified: Secondary | ICD-10-CM

## 2016-06-12 LAB — LITHIUM LEVEL: LITHIUM LVL: 0.2 mmol/L — AB (ref 0.80–1.40)

## 2016-06-12 MED ORDER — LITHIUM CARBONATE 300 MG PO CAPS: ORAL_CAPSULE | ORAL | 4 refills | Status: DC

## 2016-06-12 NOTE — Telephone Encounter (Signed)
Patient went for another lithium level today and returned at 0.62mmol/L after being off since the previous week when was at 1.6 on 06/08/16 per Dr. Casimiro Needle order.

## 2016-06-13 ENCOUNTER — Telehealth (HOSPITAL_COMMUNITY): Payer: Self-pay

## 2016-06-13 NOTE — Telephone Encounter (Signed)
I spoke with Dr. Casimiro Needle - see my note.

## 2016-06-13 NOTE — Telephone Encounter (Signed)
Patient stopped Lithium last week due to high levels on his labs, patient retook labs yesterday and they are back to normal range. Dr. Casimiro Needle had me call in Lithium 300 mg take 1 tab po bid for 3 days then increase to 2 tabs bid. One week after increasing to 2 po bid patient is to get labs. I called the patient to let him know and he voiced his understanding. Medication called in and lab order is in EPIC.

## 2016-06-21 ENCOUNTER — Encounter (HOSPITAL_COMMUNITY): Payer: Self-pay | Admitting: Psychiatry

## 2016-06-21 ENCOUNTER — Ambulatory Visit (INDEPENDENT_AMBULATORY_CARE_PROVIDER_SITE_OTHER): Payer: BLUE CROSS/BLUE SHIELD | Admitting: Psychiatry

## 2016-06-21 VITALS — BP 114/72 | HR 60 | Ht 70.5 in | Wt 212.0 lb

## 2016-06-21 DIAGNOSIS — Z79899 Other long term (current) drug therapy: Secondary | ICD-10-CM

## 2016-06-21 DIAGNOSIS — Z833 Family history of diabetes mellitus: Secondary | ICD-10-CM

## 2016-06-21 DIAGNOSIS — F331 Major depressive disorder, recurrent, moderate: Secondary | ICD-10-CM

## 2016-06-21 DIAGNOSIS — Z87891 Personal history of nicotine dependence: Secondary | ICD-10-CM

## 2016-06-21 DIAGNOSIS — F3160 Bipolar disorder, current episode mixed, unspecified: Secondary | ICD-10-CM

## 2016-06-21 DIAGNOSIS — Z8249 Family history of ischemic heart disease and other diseases of the circulatory system: Secondary | ICD-10-CM

## 2016-06-21 DIAGNOSIS — Z841 Family history of disorders of kidney and ureter: Secondary | ICD-10-CM

## 2016-06-21 DIAGNOSIS — Z9889 Other specified postprocedural states: Secondary | ICD-10-CM | POA: Diagnosis not present

## 2016-06-21 DIAGNOSIS — F330 Major depressive disorder, recurrent, mild: Secondary | ICD-10-CM

## 2016-06-21 MED ORDER — VENLAFAXINE HCL ER 150 MG PO CP24: 150.0000 mg | ORAL_CAPSULE | Freq: Every morning | ORAL | 8 refills | Status: DC

## 2016-06-21 MED ORDER — CLONAZEPAM 1 MG PO TABS: ORAL_TABLET | ORAL | 5 refills | Status: DC

## 2016-06-21 NOTE — Progress Notes (Signed)
Patient ID: Angel [name], male   DOB: 11/06/1955, 61 y.o.   MRN: [id number] [facility] MD Progress Note  06/21/2016 3:56 PM Meir Jentsch  MRN:  [id number] Subjective:  Feeling great Principal Problem: Bipolar disorder, most recent episode depression  Today the patient is seen with his partner , [name]. The patient seems to be doing fairly well. He is quite calm. His only where complaint is been having diarrhea. And sometimes it's quick occurring diarrhea. It is hard to understand why this is the case. He's actually on a lower dose of lithium that he is ever been on. At one point he had a lithium level that I believe was done at the wrong time. I think it was falsely high but we will excessively cautious and stopped his lithium. We started it days later at a lower dose coming back to lithium 2 in the morning and 2 at night. The patient has had no more mania. He's never had any significant depression. He's not sleeping as well as he used to. He is eating better. He's changed his diet. This might be a factor related to his diarrhea but I don't believe it. The patient was instructed to call his gastroenterologist about his diarrhea. Apparently his liver functions are much improved. The patient generally is quite medically stable. Is not suicidal. Past Psychiatric History:   Past Medical History:  Past Medical History:  Diagnosis Date  . Bipolar disorder ([place])   . 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 Depression   . Hepatitis B    Hepatitis E. antigen positive by history, status post treatment with Hepsera and baraclude   . Hepatitis B carrier ([place])   . Personal history of colonic polyps    Adenomas polyp 2008 and 2010, 2015     Past Surgical History:  Procedure Laterality Date  . APPENDECTOMY    . COLONOSCOPY    . POLYPECTOMY    . UPPER GASTROINTESTINAL ENDOSCOPY     Family History:  Family History  Problem Relation Age of Onset  . Heart disease Mother   . Diabetes Father   . Kidney  failure Father   . Other Sister     GERD  . Colon cancer Neg Hx    Family Psychiatric  History:  Social History:  History  Alcohol Use No     History  Drug Use No    Social History   Social History  . Marital status: Single    Spouse name: N/A  . Number of children: 0  . Years of education: N/A   Occupational History  . retired Not Employed   Social History Main Topics  . Smoking status: Former Smoker    Types: Cigarettes    Quit date: 05/08/2009  . Smokeless tobacco: Never Used  . Alcohol use No  . Drug use: No  . Sexual activity: Not Currently   Other Topics Concern  . None   Social History Narrative  . None   Additional Social History:                         Sleep: Good  Appetite:  Good  Current Medications: Current Outpatient Prescriptions  Medication Sig Dispense Refill  . clonazePAM (KLONOPIN) 1 MG tablet 2  qhs 60 tablet 5  . diphenhydrAMINE (BENADRYL) 25 MG tablet Take 75 mg by mouth at bedtime.    Marland Kitchen ipratropium (ATROVENT) 0.03 % nasal spray Place 2 sprays into the  nose 4 (four) times daily. (Patient not taking: Reported on 05/03/2016) 30 mL 6  . lithium carbonate 300 MG capsule 1 tab po bid for 3 days, then increase to 2 tabs bid, after one week get labs (lithium level) 120 capsule 4  . Melatonin 10 MG SUBL Place 10 mg under the tongue at bedtime.    . nadolol (CORGARD) 40 MG tablet TAKE 1 TABLET BY MOUTH EVERY DAY 30 tablet 0  . pantoprazole (PROTONIX) 40 MG tablet Take 1 tablet (40 mg total) by mouth daily. 30 tablet 3  . traZODone (DESYREL) 50 MG tablet Take 1 tablet (50 mg total) by mouth at bedtime. 30 tablet 3  . venlafaxine XR (EFFEXOR-XR) 150 MG 24 hr capsule Take 1 capsule (150 mg total) by mouth every morning. 30 capsule 8  . VIREAD 300 MG tablet Take 300 mg by mouth daily.      No current facility-administered medications for this visit.     Lab Results: No results found for this or any previous visit (from the past 48  hour(s)).  Physical Findings: AIMS:  , ,  ,  ,    CIWA:    COWS:     Musculoskeletal: Strength & Muscle Tone: within normal limits Gait & Station: normal Patient leans: N/A  Psychiatric Specialty Exam: ROS  Blood pressure 114/72, pulse 60, height 5' 10.5" (1.791 m), weight 212 lb (96.2 kg).Body mass index is 29.99 kg/m.  General Appearance: Casual  Eye Contact:: Good   Speech:  Clear and Coherent  Volume:  Normal  Mood:  Euthymic  Affect:  NA and Appropriate  Thought Process:  Coherent  Orientation:  Full (Time, Place, and Person)  Thought Content:  WDL  Suicidal Thoughts:  No  Homicidal Thoughts:  No  Memory:  NA  Judgement:  Good  Insight:  Good  Psychomotor Activity:  Normal  Concentration:  Good  Recall:  Good  Fund of Knowledge:Good  Language: Good  Akathisia:  No  Handed:  Right  AIMS (if indicated):     Assets:  Desire for Improvement  ADL's:  Intact  Cognition: WNL  Sleep:      Treatment Plan Summary: 06/21/2016,  At this time the patient continue taking Klonopin 1 mg and continue his Effexor as ordered. He'll continue on lithium 2 in the morning and 2 at night. Tomorrow he'll have a lithium blood level performed. We'll likely contact him after that and determine if his lithium level is high fit is will discontinue his lithium but I doubt it will be.this patient will return to see me in 7 weeks. We asked him to call in 1 week if he still having diarrhea. His mood is stable. We are considering the possibility of changing him or adding Tegretol. Will make, tach with his liver specialist. Will call Kayleen Memos at [phone] and hopefully also connect with Dr.Qureshi.

## 2016-06-22 ENCOUNTER — Other Ambulatory Visit (HOSPITAL_COMMUNITY): Payer: Self-pay | Admitting: Psychiatry

## 2016-06-23 LAB — LITHIUM LEVEL: LITHIUM LVL: 0.8 mmol/L (ref 0.80–1.40)

## 2016-07-05 ENCOUNTER — Encounter: Payer: Self-pay | Admitting: Gastroenterology

## 2016-07-10 ENCOUNTER — Other Ambulatory Visit (HOSPITAL_COMMUNITY): Payer: Self-pay

## 2016-07-10 MED ORDER — TRAZODONE HCL 50 MG PO TABS: ORAL_TABLET | ORAL | 3 refills | Status: DC

## 2016-08-08 ENCOUNTER — Encounter: Payer: Self-pay | Admitting: Gastroenterology

## 2016-08-08 ENCOUNTER — Ambulatory Visit (INDEPENDENT_AMBULATORY_CARE_PROVIDER_SITE_OTHER): Payer: BLUE CROSS/BLUE SHIELD | Admitting: Gastroenterology

## 2016-08-08 ENCOUNTER — Other Ambulatory Visit (INDEPENDENT_AMBULATORY_CARE_PROVIDER_SITE_OTHER): Payer: BLUE CROSS/BLUE SHIELD

## 2016-08-08 VITALS — BP 110/64 | HR 72 | Ht 71.0 in | Wt 205.0 lb

## 2016-08-08 DIAGNOSIS — K7460 Unspecified cirrhosis of liver: Secondary | ICD-10-CM

## 2016-08-08 DIAGNOSIS — D126 Benign neoplasm of colon, unspecified: Secondary | ICD-10-CM

## 2016-08-08 LAB — CBC WITH DIFFERENTIAL/PLATELET
BASOS ABS: 0 10*3/uL (ref 0.0–0.1)
Basophils Relative: 0.4 % (ref 0.0–3.0)
EOS ABS: 0.1 10*3/uL (ref 0.0–0.7)
Eosinophils Relative: 1.7 % (ref 0.0–5.0)
HEMATOCRIT: 40.4 % (ref 39.0–52.0)
Hemoglobin: 13.6 g/dL (ref 13.0–17.0)
LYMPHS ABS: 1.5 10*3/uL (ref 0.7–4.0)
Lymphocytes Relative: 22.3 % (ref 12.0–46.0)
MCHC: 33.7 g/dL (ref 30.0–36.0)
MCV: 100.2 fl — AB (ref 78.0–100.0)
MONO ABS: 0.6 10*3/uL (ref 0.1–1.0)
Monocytes Relative: 8.8 % (ref 3.0–12.0)
NEUTROS PCT: 66.8 % (ref 43.0–77.0)
Neutro Abs: 4.6 10*3/uL (ref 1.4–7.7)
PLATELETS: 91 10*3/uL — AB (ref 150.0–400.0)
RBC: 4.03 Mil/uL — ABNORMAL LOW (ref 4.22–5.81)
RDW: 15 % (ref 11.5–15.5)
WBC: 6.9 10*3/uL (ref 4.0–10.5)

## 2016-08-08 LAB — PROTIME-INR
INR: 1.3 ratio — ABNORMAL HIGH (ref 0.8–1.0)
Prothrombin Time: 14.2 s — ABNORMAL HIGH (ref 9.6–13.1)

## 2016-08-08 MED ORDER — NA SULFATE-K SULFATE-MG SULF 17.5-3.13-1.6 GM/177ML PO SOLN: 1.0000 | Freq: Once | ORAL | 0 refills | Status: AC

## 2016-08-08 NOTE — Progress Notes (Signed)
HPI :  61 y/o male, former patient of Dr. Deatra Ina, with history of cirrhosis secondary to hepatitis B, with a history of adenomatous colon polyps and bipolar disorder, here for a follow up visit. Last seen in 01/2015. Here to discuss surveillance colonoscopy today. Last done in 2015 with a 1.5cm adenoma removed.  He follows with Dr. Monica Martinez of transplant Hepatology at [facility] office - he's had chronic hepatitis B for several years (15 years). Treated with Epivir and Hepsera for 3-4 years and then discontinued when hospitalized for Bipolar disorder. He was seen by hepatology and started on Viread in 2015. He is taking viread 300mg  daily and on nadolol 40mg  daily. Last imaging on file with [place] was CT scan in 2015 showing cirrhosis with 1cm hypervascular nodules in the liver. Recommended f/u MRI in 6 months.  He has had his subsequent [facility] surveillance with Hepatology. He had an MRI on 04/05/16 showing nodular liver c/w cirrhosis, vascular lesions c/w AV shunts. He is scheduled for this every 6 months or so. Last lab on 01/03/16 showing platelet count of 93. Hgb 14.5, WBC 8.1. LFTs at that time showed AST 28, ALT 33, AP 90, T bil 0.2, INR of 1.2  He has been trying to lose weight with aggressive dieting, 60 lbs over the past several months.   He denies blood in the stools. No constipation, rare loose stools. No FH of colon cancer.   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 Bipolar disorder ([place])   . 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 Depression   . Hepatitis B    Hepatitis E. antigen positive by history, status post treatment with Hepsera and baraclude   . Hepatitis B carrier ([place])   . Personal history of colonic polyps    Adenomas polyp 2008 and 2010, 2015      Past Surgical History:  Procedure  Laterality Date  . APPENDECTOMY    . COLONOSCOPY    . POLYPECTOMY    . UPPER GASTROINTESTINAL ENDOSCOPY     Family History  Problem Relation Age of Onset  . Heart disease Mother   . Diabetes Father   . Kidney failure Father   . Other Sister     GERD  . Colon cancer Neg Hx    Social History  Substance Use Topics  . Smoking status: Former Smoker    Types: Cigarettes    Quit date: 05/08/2009  . Smokeless tobacco: Never Used  . Alcohol use No   Current Outpatient Prescriptions  Medication Sig Dispense Refill  . clonazePAM (KLONOPIN) 1 MG tablet 2  qhs 60 tablet 5  . diphenhydrAMINE (BENADRYL) 25 MG tablet Take 75 mg by mouth at bedtime.    Marland Kitchen lithium carbonate 300 MG capsule 1 tab po bid for 3 days, then increase to 2 tabs bid, after one week get labs (lithium level) 120 capsule 4  . Melatonin 10 MG SUBL Place 10 mg under the tongue at bedtime.    . nadolol (CORGARD) 40 MG tablet TAKE 1 TABLET BY MOUTH EVERY DAY 30 tablet 0  . pantoprazole (PROTONIX) 40 MG tablet Take 1 tablet (40 mg total) by mouth daily. 30 tablet 3  . traZODone (DESYREL) 50 MG tablet Take 1 tablet at bedtime - may increase to 2 po qhs if needed 60  tablet 3  . venlafaxine XR (EFFEXOR-XR) 150 MG 24 hr capsule Take 1 capsule (150 mg total) by mouth every morning. 30 capsule 8  . VIREAD 300 MG tablet Take 300 mg by mouth daily.      No current facility-administered medications for this visit.    Allergies  Allergen Reactions  . Aripiprazole Other (See Comments)    Tardive Dyskinesia  . Invega [Paliperidone Er] Other (See Comments)    Tardive dyskinsia symptoms  . Abilify [Aripiprazole]   . Lamictal [Lamotrigine] Rash  . Lexapro [Escitalopram Oxalate] Other (See Comments)    Doesn't Work     Review of Systems: All systems reviewed and negative except where noted in HPI.   Labs as above - reviewed in care everywhere  Physical Exam: BP 110/64   Pulse 72   Ht 5\' 11"  (1.803 m)   Wt 205 lb (93 kg)   BMI  28.59 kg/m  Constitutional: Pleasant,well-developed, male in no acute distress. HEENT: Normocephalic and atraumatic. Conjunctivae are normal. No scleral icterus. Neck supple.  Cardiovascular: Normal rate, regular rhythm.  Pulmonary/chest: Effort normal and breath sounds normal. No wheezing, rales or rhonchi. Abdominal: Soft, protuberant, nontender. No palpable masses . No hepatomegaly. Extremities: no edema Lymphadenopathy: No cervical adenopathy noted. Neurological: Alert and oriented to person place and time. Skin: Skin is warm and dry. No rashes noted. Psychiatric: Normal mood and affect. Behavior is normal.   ASSESSMENT AND PLAN: 61 year old male with chronic hepatitis B related cirrhosis and history of colon adenomas here for follow-up visit.  History of colon adenomas - 1.5 cm adenoma removed 3 years ago, due for surveillance colonoscopy at this time. He has no significant bowel symptoms that bother him. Discussed risks and benefits of colonoscopy with him and he wanted to proceed. In light of his cirrhosis we'll obtain CBC to recheck platelet count and INR to ensure no significant coagulopathy. Further recommendations pending results, he agreed.  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 [place] screening up-to-date with MRI, he is due for another exam in the next 1-2 months. He will continue Nadolol and Viread, and continue to follow up with hepatology for this issue. As above, rechecking platelets and INR prior to colonoscopy.   [place] Cellar, MD [facility] Gastroenterology Pager [phone]

## 2016-08-08 NOTE — Patient Instructions (Addendum)
If you are age 62 or older, your body mass index should be between 23-30. Your Body mass index is 28.59 kg/m. If this is out of the aforementioned range listed, please consider follow up with your Primary Care Provider.  If you are age 62 or younger, your body mass index should be between 19-25. Your Body mass index is 28.59 kg/m. If this is out of the aformentioned range listed, please consider follow up with your Primary Care Provider.   We have sent the following medications to your pharmacy for you to pick up at your convenience:  [place] have been scheduled for a colonoscopy. Please follow written instructions given to you at your visit today.  Please pick up your prep supplies at the pharmacy within the next 1-3 days. If you use inhalers (even only as needed), please bring them with you on the day of your procedure. Your physician has requested that you go to www.startemmi.com and enter the access code given to you at your visit today. This web site gives a general overview about your procedure. However, you should still follow specific instructions given to you by our office regarding your preparation for the procedure.  Your physician has requested that you go to the basement for the following lab work before leaving today:  CBC, INR  Thank you.

## 2016-08-10 ENCOUNTER — Telehealth: Payer: Self-pay | Admitting: Gastroenterology

## 2016-08-10 NOTE — Telephone Encounter (Signed)
Suprep rebate card faxed to pharmacy

## 2016-08-11 ENCOUNTER — Encounter: Payer: Self-pay | Admitting: Gastroenterology

## 2016-08-22 DIAGNOSIS — F329 Major depressive disorder, single episode, unspecified: Secondary | ICD-10-CM | POA: Diagnosis not present

## 2016-08-22 DIAGNOSIS — K7469 Other cirrhosis of liver: Secondary | ICD-10-CM | POA: Diagnosis not present

## 2016-08-22 DIAGNOSIS — B181 Chronic viral hepatitis B without delta-agent: Secondary | ICD-10-CM | POA: Diagnosis not present

## 2016-08-22 DIAGNOSIS — F319 Bipolar disorder, unspecified: Secondary | ICD-10-CM | POA: Diagnosis not present

## 2016-08-22 DIAGNOSIS — Z87891 Personal history of nicotine dependence: Secondary | ICD-10-CM | POA: Diagnosis not present

## 2016-08-22 DIAGNOSIS — Z6828 Body mass index (BMI) 28.0-28.9, adult: Secondary | ICD-10-CM | POA: Diagnosis not present

## 2016-08-23 ENCOUNTER — Ambulatory Visit (INDEPENDENT_AMBULATORY_CARE_PROVIDER_SITE_OTHER): Payer: BLUE CROSS/BLUE SHIELD | Admitting: Psychiatry

## 2016-08-23 VITALS — BP 106/62 | HR 53 | Ht 71.0 in | Wt 199.0 lb

## 2016-08-23 DIAGNOSIS — Z79899 Other long term (current) drug therapy: Secondary | ICD-10-CM | POA: Diagnosis not present

## 2016-08-23 DIAGNOSIS — F331 Major depressive disorder, recurrent, moderate: Secondary | ICD-10-CM

## 2016-08-23 DIAGNOSIS — F3110 Bipolar disorder, current episode manic without psychotic features, unspecified: Secondary | ICD-10-CM

## 2016-08-23 DIAGNOSIS — F3130 Bipolar disorder, current episode depressed, mild or moderate severity, unspecified: Secondary | ICD-10-CM

## 2016-08-23 DIAGNOSIS — F330 Major depressive disorder, recurrent, mild: Secondary | ICD-10-CM

## 2016-08-23 DIAGNOSIS — Z87891 Personal history of nicotine dependence: Secondary | ICD-10-CM

## 2016-08-23 MED ORDER — CLONAZEPAM 1 MG PO TABS: ORAL_TABLET | ORAL | 5 refills | Status: DC

## 2016-08-23 MED ORDER — LITHIUM CARBONATE 300 MG PO CAPS: ORAL_CAPSULE | ORAL | 4 refills | Status: DC

## 2016-08-23 MED ORDER — VENLAFAXINE HCL ER 150 MG PO CP24: 150.0000 mg | ORAL_CAPSULE | Freq: Every morning | ORAL | 8 refills | Status: DC

## 2016-08-23 MED ORDER — TRAZODONE HCL 50 MG PO TABS: ORAL_TABLET | ORAL | 3 refills | Status: DC

## 2016-08-23 NOTE — Progress Notes (Signed)
Patient ID: Angel [name], male   DOB: 07-23-1955, 61 y.o.   MRN: [id number] [facility] MD Progress Note  08/23/2016 3:16 PM Angel [name]  MRN:  [id number] Subjective:  Feeling great Principal Problem: Bipolar disorder, most recent episode depression  Patient is doing well.  He was seen with his diarrhea is gone.  He stopped lithium for a while and went back to it has not had a recurrence of his diarrhea.  His last lithium level was 0.8.  The patient shares no evidence of major depression at this time.  He is sleeping and eating very well.  He's got good energy.  He enjoys the 3 dogs they have at home.  He watches TV.  Patient is regarding the possibility of one day going back to work.  He is on disability for bipolar disorder.  The patient wishes his partner would retire to travel.  Patient takes his medicines just as prescribed.  He is a good bill of health.  His liver is very stable.  His enzymes are normal.  The patient denies chest pain or shortness of breath.  He denies any neurological symptoms.  He is energetic positive about things.  Remarkably he's lost over 50 pounds.  His partners lost over 100 pounds.  He does walk together and enjoy life.  Today once again reviewed his past episode of mania.  He clearly had an episode of mania which led to overspending was characterized by euphoria racing thinking a lack of the need to sleep and hyperactivity.  He has not had any mania" while and even a longer time without depression. Past Medical History:  Past Medical History:  Diagnosis Date  . Bipolar disorder ([place])   . 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 Depression   . Hepatitis B    Hepatitis E. antigen positive by history, status post treatment with Hepsera and baraclude   . Hepatitis B carrier ([place])   . Personal history of colonic polyps    Adenomas polyp 2008 and 2010, 2015     Past Surgical History:  Procedure Laterality Date  . APPENDECTOMY    . COLONOSCOPY    . POLYPECTOMY     . UPPER GASTROINTESTINAL ENDOSCOPY     Family History:  Family History  Problem Relation Age of Onset  . Heart disease Mother   . Diabetes Father   . Kidney failure Father   . Other Sister     GERD  . Colon cancer Neg Hx    Family Psychiatric  History:  Social History:  History  Alcohol Use No     History  Drug Use No    Social History   Social History  . Marital status: Single    Spouse name: N/A  . Number of children: 0  . Years of education: N/A   Occupational History  . retired Not Employed   Social History Main Topics  . Smoking status: Former Smoker    Types: Cigarettes    Quit date: 05/08/2009  . Smokeless tobacco: Never Used  . Alcohol use No  . Drug use: No  . Sexual activity: Not Currently   Other Topics Concern  . Not on file   Social History Narrative  . No narrative on file   Additional Social History:                         Sleep: Good  Appetite:  Good  Current Medications: Current  Outpatient Prescriptions  Medication Sig Dispense Refill  . clonazePAM (KLONOPIN) 1 MG tablet 2  qhs 60 tablet 5  . diphenhydrAMINE (BENADRYL) 25 MG tablet Take 75 mg by mouth at bedtime.    Marland Kitchen lithium carbonate 300 MG capsule 1 tab po bid for 3 days, then increase to 2 tabs bid, after one week get labs (lithium level) 120 capsule 4  . Melatonin 10 MG SUBL Place 10 mg under the tongue at bedtime.    . nadolol (CORGARD) 40 MG tablet TAKE 1 TABLET BY MOUTH EVERY DAY 30 tablet 0  . pantoprazole (PROTONIX) 40 MG tablet Take 1 tablet (40 mg total) by mouth daily. 30 tablet 3  . traZODone (DESYREL) 50 MG tablet Take 1 tablet at bedtime - may increase to 2 po qhs if needed 60 tablet 3  . venlafaxine XR (EFFEXOR-XR) 150 MG 24 hr capsule Take 1 capsule (150 mg total) by mouth every morning. 30 capsule 8  . VIREAD 300 MG tablet Take 300 mg by mouth daily.      No current facility-administered medications for this visit.     Lab Results: No results found for  this or any previous visit (from the past 48 hour(s)).  Physical Findings: AIMS:  , ,  ,  ,    CIWA:    COWS:     Musculoskeletal: Strength & Muscle Tone: within normal limits Gait & Station: normal Patient leans: N/A  Psychiatric Specialty Exam: ROS  Blood pressure 106/62, pulse (!) 53, height 5\' 11"  (1.803 m), weight 199 lb (90.3 kg).Body mass index is 27.75 kg/m.  General Appearance: Casual  Eye Contact:: Good   Speech:  Clear and Coherent  Volume:  Normal  Mood:  Euthymic  Affect:  NA and Appropriate  Thought Process:  Coherent  Orientation:  Full (Time, Place, and Person)  Thought Content:  WDL  Suicidal Thoughts:  No  Homicidal Thoughts:  No  Memory:  NA  Judgement:  Good  Insight:  Good  Psychomotor Activity:  Normal  Concentration:  Good  Recall:  Good  Fund of Knowledge:Good  Language: Good  Akathisia:  No  Handed:  Right  AIMS (if indicated):     Assets:  Desire for Improvement  ADL's:  Intact  Cognition: WNL  Sleep:      Treatment Plan Summary: 08/23/2016, At this time the patient will continue taking lithium 300 mg 2 in the morning and 2 at night.  The next week he'll get a lithium blood level, TSH and comprehensive metabolic panel.  The patient will continue taking Klonopin 1 mg at night.  Continue Effexor  150 mg every morning. this patient is very stable at this time is functioning very well.  He brought with him some forms and attempt to get the government to forgive his school loans.  I will definitely look into this.  The patient return to see me in 3 months.

## 2016-08-25 ENCOUNTER — Encounter: Payer: Self-pay | Admitting: Gastroenterology

## 2016-08-25 ENCOUNTER — Ambulatory Visit (AMBULATORY_SURGERY_CENTER): Payer: BLUE CROSS/BLUE SHIELD | Admitting: Gastroenterology

## 2016-08-25 VITALS — BP 107/66 | HR 57 | Temp 97.8°F | Resp 22 | Ht 71.0 in | Wt 205.0 lb

## 2016-08-25 DIAGNOSIS — D123 Benign neoplasm of transverse colon: Secondary | ICD-10-CM

## 2016-08-25 DIAGNOSIS — Z86010 Personal history of colonic polyps: Secondary | ICD-10-CM

## 2016-08-25 MED ORDER — SODIUM CHLORIDE 0.9 % IV SOLN: 500.0000 mL | INTRAVENOUS | Status: DC

## 2016-08-25 NOTE — Progress Notes (Signed)
Called to room to assist during endoscopic procedure.  Patient ID and intended procedure confirmed with present staff. Received instructions for my participation in the procedure from the performing physician.  

## 2016-08-25 NOTE — Patient Instructions (Signed)
YOU HAD AN ENDOSCOPIC PROCEDURE [facility]:   Refer to the procedure report that was given to you for any specific questions about what was found during the examination.  If the procedure report does not answer your questions, please call your gastroenterologist to clarify.  If you requested that your care partner not be given the details of your procedure findings, then the procedure report has been included in a sealed envelope for you to review at your convenience later.  YOU SHOULD EXPECT: Some feelings of bloating in the abdomen. Passage of more gas than usual.  Walking can help get rid of the air that was put into your GI tract during the procedure and reduce the bloating. If you had a lower endoscopy (such as a colonoscopy or flexible sigmoidoscopy) you may notice spotting of blood in your stool or on the toilet paper. If you underwent a bowel prep for your procedure, you may not have a normal bowel movement for a few days.  Please Note:  You might notice some irritation and congestion in your nose or some drainage.  This is from the oxygen used during your procedure.  There is no need for concern and it should clear up in a day or so.  SYMPTOMS TO REPORT IMMEDIATELY:   Following lower endoscopy (colonoscopy or flexible sigmoidoscopy):  Excessive amounts of blood in the stool  Significant tenderness or worsening of abdominal pains  Swelling of the abdomen that is new, acute  Fever of 100F or higher    For urgent or emergent issues, a gastroenterologist can be reached at any hour by calling [phone].   DIET:  We do recommend a small meal at first, but then you may proceed to your regular diet.  Drink plenty of fluids but you should avoid alcoholic beverages for 24 hours.  ACTIVITY:  You should plan to take it easy for the rest of today and you should NOT DRIVE or use heavy machinery until tomorrow (because of the sedation medicines used during the test).     FOLLOW UP: Our staff will call the number listed on your records the next business day following your procedure to check on you and address any questions or concerns that you may have regarding the information given to you following your procedure. If we do not reach you, we will leave a message.  However, if you are feeling well and you are not experiencing any problems, there is no need to return our call.  We will assume that you have returned to your regular daily activities without incident.  If any biopsies were taken you will be contacted by phone or by letter within the next 1-3 weeks.  Please call us at [phone] if you have not heard about the biopsies in 3 weeks.    SIGNATURES/CONFIDENTIALITY: You and/or your care partner have signed paperwork which will be entered into your electronic medical record.  These signatures attest to the fact that that the information above on your After Visit Summary has been reviewed and is understood.  Full responsibility of the confidentiality of this discharge information lies with you and/or your care-partner  Polyp and hemorrhoid information given.. 

## 2016-08-25 NOTE — Progress Notes (Signed)
To recovery, report to Scott, RN, VSS 

## 2016-08-25 NOTE — Op Note (Signed)
[place] Patient Name: Angel [name] Procedure Date: 08/25/2016 7:55 AM MRN: [id number] Endoscopist: Remo Lipps P. Kylea Berrong MD, MD Age: 61 Referring MD:  Date of Birth: [birth year] Gender: Male Account #: [id number] Procedure:                Colonoscopy Indications:              Surveillance: Personal history of adenomatous                            polyps on last colonoscopy 3 years ago Medicines:                Monitored Anesthesia Care Procedure:                Pre-Anesthesia Assessment:                           - Prior to the procedure, a History and Physical                            was performed, and patient medications and                            allergies were reviewed. The patient's tolerance of                            previous anesthesia was also reviewed. The risks                            and benefits of the procedure and the sedation                            options and risks were discussed with the patient.                            All questions were answered, and informed consent                            was obtained. Prior Anticoagulants: The patient has                            taken no previous anticoagulant or antiplatelet                            agents. ASA Grade Assessment: III - A patient with                            severe systemic disease. After reviewing the risks                            and benefits, the patient was deemed in                            satisfactory condition to undergo the procedure.  After obtaining informed consent, the colonoscope                            was passed under direct vision. Throughout the                            procedure, the patient's blood pressure, pulse, and                            oxygen saturations were monitored continuously. The                            Model CF-HQ190L [id number]) scope was introduced                            through the  anus and advanced to the the cecum,                            identified by appendiceal orifice and ileocecal                            valve. The colonoscopy was performed without                            difficulty. The patient tolerated the procedure                            well. The quality of the bowel preparation was                            inadequate. The terminal ileum, ileocecal valve,                            appendiceal orifice, and rectum were photographed. Scope In: 8:11:40 AM Scope Out: 8:25:12 AM Scope Withdrawal Time: 0 hours 9 minutes 14 seconds  Total Procedure Duration: 0 hours 13 minutes 32 seconds  Findings:                 The perianal exam findings include skin tags.                           A 5 mm polyp was found in the transverse colon. The                            polyp was sessile. The polyp was removed with a                            cold snare. Resection and retrieval were complete.                           A large amount of semi-liquid stool was found in                            the entire  colon, making visualization difficult.                            Lavage of the area was performed using copious                            amounts of sterile water, resulting in incomplete                            clearance with fair visualization.                           Internal hemorrhoids were found during retroflexion.                           The exam was otherwise without abnormality. Complications:            No immediate complications. Estimated blood loss:                            Minimal. Estimated Blood Loss:     Estimated blood loss was minimal. Impression:               - Preparation of the colon was inadequate.                           - Perianal skin tags found on perianal exam.                           - One 5 mm polyp in the transverse colon, removed                            with a cold snare. Resected and retrieved.                            - Stool in the entire examined colon.                           - Internal hemorrhoids.                           - The examination was otherwise normal. Recommendation:           - Patient has a contact number available for                            emergencies. The signs and symptoms of potential                            delayed complications were discussed with the                            patient. Return to normal activities tomorrow.                            Written discharge instructions were provided to the  patient.                           - Resume previous diet.                           - Continue present medications.                           - Await pathology results.                           - Repeat colonoscopy is recommended due to poor                            bowel prep. Recommend 2 day bowel prep with                            colonoscopy at the patient's convenience in the                            next few months Omeed Osuna P. Valencia Kassa MD, MD 08/25/2016 8:29:26 AM This report has been signed electronically.

## 2016-08-28 ENCOUNTER — Telehealth: Payer: Self-pay | Admitting: *Deleted

## 2016-08-28 NOTE — Telephone Encounter (Signed)
  Follow up Call-  Call back number 08/25/2016  Post procedure Call Back phone  # [phone]  Permission to leave phone message Yes  Some recent data might be hidden     Patient questions:  Message left to all [place] if necessary.

## 2016-08-28 NOTE — Telephone Encounter (Signed)
  Follow up Call-  Call back number 08/25/2016  Post procedure Call Back phone  # [phone]  Permission to leave phone message Yes  Some recent data might be hidden     Patient questions:  Do you have a fever, pain , or abdominal swelling? No. Pain Score  0 *  Have you tolerated food without any problems? Yes.    Have you been able to return to your normal activities? Yes.    Do you have any questions about your discharge instructions: Diet   No. Medications  No. Follow up visit  No.  Do you have questions or concerns about your Care? No.  Actions: * If pain score is 4 or above: No action needed, pain <4.

## 2016-08-30 ENCOUNTER — Encounter: Payer: Self-pay | Admitting: Gastroenterology

## 2016-09-07 ENCOUNTER — Encounter: Payer: Self-pay | Admitting: Gastroenterology

## 2016-09-08 ENCOUNTER — Other Ambulatory Visit: Payer: Self-pay

## 2016-09-13 ENCOUNTER — Other Ambulatory Visit: Payer: Self-pay

## 2016-09-13 DIAGNOSIS — K7460 Unspecified cirrhosis of liver: Secondary | ICD-10-CM

## 2016-09-23 ENCOUNTER — Encounter: Payer: Self-pay | Admitting: Gastroenterology

## 2016-09-28 ENCOUNTER — Encounter: Payer: Self-pay | Admitting: Family Medicine

## 2016-09-28 ENCOUNTER — Ambulatory Visit (INDEPENDENT_AMBULATORY_CARE_PROVIDER_SITE_OTHER): Payer: BLUE CROSS/BLUE SHIELD | Admitting: Family Medicine

## 2016-09-28 VITALS — BP 100/62 | HR 55 | Temp 97.8°F | Ht 69.5 in | Wt 196.8 lb

## 2016-09-28 DIAGNOSIS — R001 Bradycardia, unspecified: Secondary | ICD-10-CM

## 2016-09-28 DIAGNOSIS — Z131 Encounter for screening for diabetes mellitus: Secondary | ICD-10-CM

## 2016-09-28 DIAGNOSIS — Z125 Encounter for screening for malignant neoplasm of prostate: Secondary | ICD-10-CM | POA: Diagnosis not present

## 2016-09-28 DIAGNOSIS — Z0000 Encounter for general adult medical examination without abnormal findings: Secondary | ICD-10-CM

## 2016-09-28 DIAGNOSIS — K219 Gastro-esophageal reflux disease without esophagitis: Secondary | ICD-10-CM | POA: Diagnosis not present

## 2016-09-28 DIAGNOSIS — Z13220 Encounter for screening for lipoid disorders: Secondary | ICD-10-CM | POA: Diagnosis not present

## 2016-09-28 MED ORDER — PANTOPRAZOLE SODIUM 40 MG PO TBEC: 40.0000 mg | DELAYED_RELEASE_TABLET | Freq: Every day | ORAL | 3 refills | Status: DC

## 2016-09-28 NOTE — Progress Notes (Addendum)
[facility] [address], [place],  [address] [phone]  Date:  09/28/2016   Name:  Angel [name]   DOB:  06-27-1955   MRN:  [id number]  PCP:  Darreld Mclean, MD    Chief Complaint: Annual Exam (Pt here for CPE.)   History of Present Illness:  Angel [name] is a 61 y.o. very pleasant male patient who presents with the following:  I last saw this pt about a year ago, we did screening labs.  Here today for a CPE He has a history of controlled bipolar 1, and hep B, cirrhosis, gerd  Here today with [name], his partner  He is a [facility] hepatology patient.  They are not thinking about a transplant at his time   Wt Readings from Last 3 Encounters:  09/28/16 196 lb 12.8 oz (89.3 kg)  08/25/16 205 lb (93 kg)  08/08/16 205 lb (93 kg)   He has been trying to lose weight- he weighed 233 about one year ago His partner went through gastric bypass so their diet has changed.  They are also doing more walking He tries to get 4k to [facility] steps a day He never has any CP or pre-syncopal feeling, no unusual SOB with exercising.  He does note that he can get winded when they are walking fast, but this is getting better as he gains fitness  He is feeling well He is still taking pantoprazole and would like for me to refill this today He sees Dr Havery Moros for his GI care since Dr Deatra Ina has retired His mental health is ok, he is sleeping well. He did add trazodone at bedtime- he sees Dr. Sheran Spine BP Readings from Last 3 Encounters:  09/28/16 100/62  08/25/16 107/66  08/08/16 110/64   He is not on any medication for his BP at this time - however he does take nadolol for "prevention of blood vessel growth in my esophagus."  Noted that he is bradycardic today He has not noted any sx of bradycardia but has seen his fit bit registering a slow pulse.  They monitor his BP at home as well and it is generally on the low side like  today  Pulse Readings from Last 3 Encounters:  09/28/16 (!) 55  08/25/16 (!) 57  08/08/16 72    Patient Active Problem List   Diagnosis Date Noted  . Hyperglycemia 10/07/2013  . Hepatic cirrhosis ([place]) 10/03/2013  . Bipolar I disorder, most recent episode (or current) depressed, unspecified 05/21/2013  . Hepatitis B 03/21/2013  . Esophageal reflux 03/21/2013  . Personal history of colonic polyps 03/21/2013  . Bipolar disorder, unspecified ([place]) 09/03/2011    Past Medical History:  Diagnosis Date  . Bipolar disorder ([place])   . 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 Depression   . Hepatitis B    Hepatitis E. antigen positive by history, status post treatment with Hepsera and baraclude   . Hepatitis B carrier ([place])   . Personal history of colonic polyps    Adenomas polyp 2008 and 2010, 2015     Past Surgical History:  Procedure Laterality Date  . APPENDECTOMY    . COLONOSCOPY    . POLYPECTOMY    . UPPER GASTROINTESTINAL ENDOSCOPY      Social History  Substance Use Topics  . Smoking status: Former Smoker    Types: Cigarettes    Quit date: 05/08/2009  .  Smokeless tobacco: Never Used  . Alcohol use No    Family History  Problem Relation Age of Onset  . Heart disease Mother   . Diabetes Father   . Kidney failure Father   . Kidney disease Father   . Other Sister        GERD  . Colon cancer Neg Hx     Allergies  Allergen Reactions  . Aripiprazole Other (See Comments)    Tardive Dyskinesia  . Invega [Paliperidone Er] Other (See Comments)    Tardive dyskinsia symptoms  . Abilify [Aripiprazole]   . Lamictal [Lamotrigine] Rash  . Lexapro [Escitalopram Oxalate] Other (See Comments)    Doesn't Work    Medication list has been reviewed and updated.  Current Outpatient Prescriptions on File Prior to Visit  Medication Sig Dispense Refill  . clonazePAM (KLONOPIN) 1 MG tablet 2  qhs 60 tablet 5  . diphenhydrAMINE (BENADRYL) 25 MG tablet Take 75 mg by  mouth at bedtime.    Marland Kitchen lithium carbonate 300 MG capsule 1 tab po bid for 3 days, then increase to 2 tabs bid, after one week get labs (lithium level) 120 capsule 4  . Melatonin 10 MG SUBL Place 10 mg under the tongue at bedtime.    . nadolol (CORGARD) 40 MG tablet TAKE 1 TABLET BY MOUTH EVERY DAY 30 tablet 0  . pantoprazole (PROTONIX) 40 MG tablet Take 1 tablet (40 mg total) by mouth daily. 30 tablet 3  . traZODone (DESYREL) 50 MG tablet Take 1 tablet at bedtime - may increase to 2 po qhs if needed 60 tablet 3  . venlafaxine XR (EFFEXOR-XR) 150 MG 24 hr capsule Take 1 capsule (150 mg total) by mouth every morning. 30 capsule 8  . VIREAD 300 MG tablet Take 300 mg by mouth daily.      Current Facility-Administered Medications on File Prior to Visit  Medication Dose Route Frequency Provider Last Rate Last Dose  . 0.9 %  sodium chloride infusion  500 mL Intravenous Continuous Armbruster, Renelda Loma, MD        Review of Systems:  As per HPI- otherwise negative.   Physical Examination: Vitals:   09/28/16 1645  BP: 100/62  Pulse: (!) 55  Temp: 97.8 F (36.6 C)   Vitals:   09/28/16 1645  Weight: 196 lb 12.8 oz (89.3 kg)  Height: 5' 9.5" (1.765 m)   Body mass index is 28.65 kg/m. Ideal Body Weight: Weight in (lb) to have BMI = 25: 171.4  GEN: WDWN, NAD, Non-toxic, A & O x 3, overweight but has lost, looks well HEENT: Atraumatic, Normocephalic. Neck supple. No masses, No LAD.  Bilateral TM wnl, oropharynx normal.  PEERL,EOMI.   Ears and Nose: No external deformity. CV: RRR but bradycardic to mid 50s, No M/G/R. No JVD. No thrill. No extra heart sounds. PULM: CTA B, no wheezes, crackles, rhonchi. No retractions. No resp. distress. No accessory muscle use. ABD: S, NT, ND, +BS. No rebound. No HSM. EXTR: No c/c/e NEURO Normal gait.  PSYCH: Normally interactive. Conversant. Not depressed or anxious appearing.  Calm demeanor.   EKG: bradycardia with rate of 49- otherwise normal Had pt  do 10 squats- pulse to 60 Assessment and Plan: Screening for diabetes mellitus - Plan: Comprehensive metabolic panel, Hemoglobin A1c  Screening for prostate cancer - Plan: PSA  Screening for hyperlipidemia - Plan: Lipid panel  Gastroesophageal reflux disease, esophagitis presence not specified - Plan: pantoprazole (PROTONIX) 40 MG tablet  Physical exam  Bradycardia - Plan: EKG 12-Lead  Here today for a CPE He has a colonoscopy coming up soon Obtained labs as above He is taking a BB for some reason- I think this may be per his GI team  Will find out more about why he is taking this as we may need to consider coming off due to bradycardia Went over warning signs of symptomatic bradycardia and hypotenaion Will plan further follow- up pending labs.   Signed Lamar Blinks, MD  Received his labs  Results for orders placed or performed in visit on 09/28/16  Comprehensive metabolic panel  Result Value Ref Range   Sodium 137 135 - 145 mEq/L   Potassium 4.1 3.5 - 5.1 mEq/L   Chloride 103 96 - 112 mEq/L   CO2 30 19 - 32 mEq/L   Glucose, Bld 125 (H) 70 - 99 mg/dL   BUN 14 6 - 23 mg/dL   Creatinine, Ser 1.06 0.40 - 1.50 mg/dL   Total Bilirubin 0.4 0.2 - 1.2 mg/dL   Alkaline Phosphatase 69 39 - 117 U/L   AST 22 0 - 37 U/L   ALT 21 0 - 53 U/L   Total Protein 6.4 6.0 - 8.3 g/dL   Albumin 4.2 3.5 - 5.2 g/dL   Calcium 9.0 8.4 - 10.5 mg/dL   GFR 75.34 >60.00 mL/min  Hemoglobin A1c  Result Value Ref Range   Hgb A1c MFr Bld 5.4 4.6 - 6.5 %  Lipid panel  Result Value Ref Range   Cholesterol 103 0 - 200 mg/dL   Triglycerides 164.0 (H) 0.0 - 149.0 mg/dL   HDL 25.60 (L) >39.00 mg/dL   VLDL 32.8 0.0 - 40.0 mg/dL   LDL Cholesterol 45 0 - 99 mg/dL   Total CHOL/HDL Ratio 4    NonHDL 77.81   PSA  Result Value Ref Range   PSA 3.14 0.10 - 4.00 ng/mL

## 2016-09-28 NOTE — Patient Instructions (Addendum)
It was a pleasure to see you in the office, as always!  I will be in touch with your labs asap Your blood pressure and pulse are both on the low side today.  This is not a problem, but do watch for your pulse going any lower as this could lead to dizziness especially if exercising in the heat!  Be sure to stay well hydrated- gatorade, and adding a little salt to your diet may help  Your pulse is low- but not dangerously so.  We will want to monitor this- please keep me posted about your blood pressure and pulse readings at home

## 2016-09-29 ENCOUNTER — Encounter: Payer: Self-pay | Admitting: Family Medicine

## 2016-09-29 LAB — COMPREHENSIVE METABOLIC PANEL
ALK PHOS: 69 U/L (ref 39–117)
ALT: 21 U/L (ref 0–53)
AST: 22 U/L (ref 0–37)
Albumin: 4.2 g/dL (ref 3.5–5.2)
BUN: 14 mg/dL (ref 6–23)
CALCIUM: 9 mg/dL (ref 8.4–10.5)
CO2: 30 mEq/L (ref 19–32)
Chloride: 103 mEq/L (ref 96–112)
Creatinine, Ser: 1.06 mg/dL (ref 0.40–1.50)
GFR: 75.34 mL/min (ref >60.00)
GLUCOSE: 125 mg/dL — AB (ref 70–99)
POTASSIUM: 4.1 meq/L (ref 3.5–5.1)
Sodium: 137 mEq/L (ref 135–145)
TOTAL PROTEIN: 6.4 g/dL (ref 6.0–8.3)
Total Bilirubin: 0.4 mg/dL (ref 0.2–1.2)

## 2016-09-29 LAB — LIPID PANEL
CHOLESTEROL: 103 mg/dL (ref 0–200)
HDL: 25.6 mg/dL — AB (ref >39.00)
LDL Cholesterol: 45 mg/dL (ref 0–99)
NonHDL: 77.81
TRIGLYCERIDES: 164 mg/dL — AB (ref 0.0–149.0)
Total CHOL/HDL Ratio: 4
VLDL: 32.8 mg/dL (ref 0.0–40.0)

## 2016-09-29 LAB — PSA: PSA: 3.14 ng/mL (ref 0.10–4.00)

## 2016-09-29 LAB — HEMOGLOBIN A1C: Hgb A1c MFr Bld: 5.4 % (ref 4.6–6.5)

## 2016-10-01 ENCOUNTER — Encounter: Payer: Self-pay | Admitting: Family Medicine

## 2016-10-05 ENCOUNTER — Ambulatory Visit (AMBULATORY_SURGERY_CENTER): Payer: Self-pay

## 2016-10-05 VITALS — Ht 70.0 in | Wt 194.5 lb

## 2016-10-05 DIAGNOSIS — Z86010 Personal history of colonic polyps: Secondary | ICD-10-CM

## 2016-10-05 MED ORDER — NA SULFATE-K SULFATE-MG SULF 17.5-3.13-1.6 GM/177ML PO SOLN: 1.0000 | Freq: Once | ORAL | 0 refills | Status: AC

## 2016-10-05 NOTE — Progress Notes (Signed)
Denies allergies to eggs or soy products. Denies complication of anesthesia or sedation. Denies use of weight loss medication. Denies use of O2.   Emmi instructions given for colonoscopy.  

## 2016-10-09 DIAGNOSIS — K7469 Other cirrhosis of liver: Secondary | ICD-10-CM | POA: Diagnosis not present

## 2016-10-09 DIAGNOSIS — R161 Splenomegaly, not elsewhere classified: Secondary | ICD-10-CM | POA: Diagnosis not present

## 2016-10-09 DIAGNOSIS — K766 Portal hypertension: Secondary | ICD-10-CM | POA: Diagnosis not present

## 2016-10-09 DIAGNOSIS — R160 Hepatomegaly, not elsewhere classified: Secondary | ICD-10-CM | POA: Diagnosis not present

## 2016-10-12 ENCOUNTER — Encounter: Payer: Self-pay | Admitting: Gastroenterology

## 2016-10-19 ENCOUNTER — Ambulatory Visit (AMBULATORY_SURGERY_CENTER): Payer: BLUE CROSS/BLUE SHIELD | Admitting: Gastroenterology

## 2016-10-19 ENCOUNTER — Encounter: Payer: Self-pay | Admitting: Gastroenterology

## 2016-10-19 VITALS — BP 111/58 | HR 50 | Temp 98.4°F | Resp 12 | Ht 70.0 in | Wt 194.0 lb

## 2016-10-19 DIAGNOSIS — D125 Benign neoplasm of sigmoid colon: Secondary | ICD-10-CM

## 2016-10-19 DIAGNOSIS — K635 Polyp of colon: Secondary | ICD-10-CM

## 2016-10-19 DIAGNOSIS — Z860100 Personal history of colon polyps, unspecified: Secondary | ICD-10-CM

## 2016-10-19 MED ORDER — SODIUM CHLORIDE 0.9 % IV SOLN: 500.0000 mL | INTRAVENOUS | Status: DC

## 2016-10-19 NOTE — Patient Instructions (Signed)
YOU HAD AN ENDOSCOPIC PROCEDURE TODAY AT THE [facility]:   Refer to the procedure report that was given to you for any specific questions about what was found during the examination.  If the procedure report does not answer your questions, please call your gastroenterologist to clarify.  If you requested that your care partner not be given the details of your procedure findings, then the procedure report has been included in a sealed envelope for you to review at your convenience later.  YOU SHOULD EXPECT: Some feelings of bloating in the abdomen. Passage of more gas than usual.  Walking can help get rid of the air that was put into your GI tract during the procedure and reduce the bloating. If you had a lower endoscopy (such as a colonoscopy or flexible sigmoidoscopy) you may notice spotting of blood in your stool or on the toilet paper. If you underwent a bowel prep for your procedure, you may not have a normal bowel movement for a few days.  Please Note:  You might notice some irritation and congestion in your nose or some drainage.  This is from the oxygen used during your procedure.  There is no need for concern and it should clear up in a day or so.  SYMPTOMS TO REPORT IMMEDIATELY:   Following lower endoscopy (colonoscopy or flexible sigmoidoscopy):  Excessive amounts of blood in the stool  Significant tenderness or worsening of abdominal pains  Swelling of the abdomen that is new, acute  Fever of 100F or higher   For urgent or emergent issues, a gastroenterologist can be reached at any hour by calling [phone].   DIET:  We do recommend a small meal at first, but then you may proceed to your regular diet.  Drink plenty of fluids but you should avoid alcoholic beverages for 24 hours.  ACTIVITY:  You should plan to take it easy for the rest of today and you should NOT DRIVE or use heavy machinery until tomorrow (because of the sedation medicines used during the test).     FOLLOW UP: Our staff will call the number listed on your records the next business day following your procedure to check on you and address any questions or concerns that you may have regarding the information given to you following your procedure. If we do not reach you, we will leave a message.  However, if you are feeling well and you are not experiencing any problems, there is no need to return our call.  We will assume that you have returned to your regular daily activities without incident.  If any biopsies were taken you will be contacted by phone or by letter within the next 1-3 weeks.  Please call us at [phone] if you have not heard about the biopsies in 3 weeks.    SIGNATURES/CONFIDENTIALITY: You and/or your care partner have signed paperwork which will be entered into your electronic medical record.  These signatures attest to the fact that that the information above on your After Visit Summary has been reviewed and is understood.  Full responsibility of the confidentiality of this discharge information lies with you and/or your care-partner.  Read all of the handouts given to you by your recovery room nurse. 

## 2016-10-19 NOTE — Progress Notes (Signed)
Report given to PACU, vss 

## 2016-10-19 NOTE — Progress Notes (Signed)
Called to room to assist during endoscopic procedure.  Patient ID and intended procedure confirmed with present staff. Received instructions for my participation in the procedure from the performing physician.  

## 2016-10-19 NOTE — Op Note (Signed)
[facility] Patient Name: Angel [name] Procedure Date: 10/19/2016 7:38 AM MRN: [id number] Endoscopist: Remo Lipps P. Ramello Cordial MD, MD Age: 61 Referring MD:  Date of Birth: 09/03/1955 Gender: Male Account #: [id number] Procedure:                Colonoscopy Indications:              High risk colon cancer surveillance: Personal                            history of adenomas, last exam limited by poor prep Medicines:                Monitored Anesthesia Care Procedure:                Pre-Anesthesia Assessment:                           - Prior to the procedure, a History and Physical                            was performed, and patient medications and                            allergies were reviewed. The patient's tolerance of                            previous anesthesia was also reviewed. The risks                            and benefits of the procedure and the sedation                            options and risks were discussed with the patient.                            All questions were answered, and informed consent                            was obtained. Prior Anticoagulants: The patient has                            taken no previous anticoagulant or antiplatelet                            agents. ASA Grade Assessment: II - A patient with                            mild systemic disease. After reviewing the risks                            and benefits, the patient was deemed in                            satisfactory condition to undergo the procedure.  After obtaining informed consent, the colonoscope                            was passed under direct vision. Throughout the                            procedure, the patient's blood pressure, pulse, and                            oxygen saturations were monitored continuously. The                            Colonoscope was introduced through the anus and   advanced to the the cecum, identified by                            appendiceal orifice and ileocecal valve. The                            colonoscopy was performed without difficulty. The                            patient tolerated the procedure well. The quality                            of the bowel preparation was adequate. The                            ileocecal valve, appendiceal orifice, and rectum                            were photographed. Scope In: 8:02:15 AM Scope Out: 8:25:29 AM Scope Withdrawal Time: 0 hours 17 minutes 19 seconds  Total Procedure Duration: 0 hours 23 minutes 14 seconds  Findings:                 The perianal and digital rectal examinations were                            normal.                           A 5 mm polyp was found in the sigmoid colon. The                            polyp was flat. The polyp was removed with a cold                            snare. Resection and retrieval were complete.                           Internal hemorrhoids were found during retroflexion.                           There was a significant amount of residual liquid  stool in the colon, several minutes were spent                            lavaging the colon, with adequate views obtained.                            The exam was otherwise without abnormality. Complications:            No immediate complications. Estimated blood loss:                            Minimal. Estimated Blood Loss:     Estimated blood loss was minimal. Impression:               - One 5 mm polyp in the sigmoid colon, removed with                            a cold snare. Resected and retrieved.                           - Internal hemorrhoids.                           - The examination was otherwise normal. Recommendation:           - Patient has a contact number available for                            emergencies. The signs and symptoms of potential                             delayed complications were discussed with the                            patient. Return to normal activities tomorrow.                            Written discharge instructions were provided to the                            patient.                           - Resume previous diet.                           - Continue present medications.                           - Await pathology results.                           - Repeat colonoscopy is recommended for                            surveillance in 5 years given one small adenoma  removed on the last exam                           - No ibuprofen, naproxen, or other non-steroidal                            anti-inflammatory drugs for 2 weeks after polyp                            removal. Remo Lipps P. Anselmo Reihl MD, MD 10/19/2016 8:30:51 AM This report has been signed electronically.

## 2016-10-20 ENCOUNTER — Telehealth: Payer: Self-pay | Admitting: *Deleted

## 2016-10-20 NOTE — Telephone Encounter (Signed)
  Follow up Call-  Call back number 10/19/2016 08/25/2016  Post procedure Call Back phone  # [phone]  Permission to leave phone message Yes Yes  Some recent data might be hidden    [facility]

## 2016-10-20 NOTE — Telephone Encounter (Signed)
Left message on afternoon attempted f/u call

## 2016-10-26 ENCOUNTER — Encounter: Payer: Self-pay | Admitting: Gastroenterology

## 2016-11-09 DIAGNOSIS — F3110 Bipolar disorder, current episode manic without psychotic features, unspecified: Secondary | ICD-10-CM | POA: Diagnosis not present

## 2016-11-09 LAB — CMP 10231
AG RATIO: 1.6 ratio (ref 1.0–2.5)
ALBUMIN: 3.8 g/dL (ref 3.6–5.1)
ALK PHOS: 73 U/L (ref 40–115)
ALT: 26 U/L (ref 9–46)
AST: 23 U/L (ref 10–35)
BILIRUBIN TOTAL: 0.3 mg/dL (ref 0.2–1.2)
BUN / CREAT RATIO: 15.6 ratio (ref 6–22)
BUN: 15 mg/dL (ref 7–25)
CALCIUM: 8.9 mg/dL (ref 8.6–10.3)
CO2: 25 mmol/L (ref 20–31)
CREATININE: 0.96 mg/dL (ref 0.70–1.25)
Chloride: 108 mmol/L (ref 98–110)
GFR, Est African American: >89 mL/min (ref >=60)
GFR, Est Non African American: 85 mL/min (ref >=60)
GLOBULIN: 2.4 g/dL (ref 1.9–3.7)
Glucose, Bld: 92 mg/dL (ref 65–99)
Potassium: 4.4 mmol/L (ref 3.5–5.3)
SODIUM: 143 mmol/L (ref 135–146)
Total Protein: 6.2 g/dL (ref 6.1–8.1)

## 2016-11-09 LAB — LIPID PANEL
CHOLESTEROL: 106 mg/dL (ref <200)
HDL: 29 mg/dL — ABNORMAL LOW (ref >40)
LDL-Cholesterol: 61 mg/dL
Non-HDL Cholesterol (Calc): 77 mg/dL (ref <130)
Total CHOL/HDL Ratio: 3.7 Ratio (ref <5.0)
Triglycerides: 79 mg/dL (ref <150)

## 2016-11-09 LAB — CBC WITH DIFFERENTIAL/PLATELET
BASOS ABS: 0 {cells}/uL (ref 0–200)
Basophils Relative: 0 %
EOS PCT: 2 %
Eosinophils Absolute: 94 cells/uL (ref 15–500)
HCT: 39.9 % (ref 38.5–50.0)
HEMOGLOBIN: 13.3 g/dL (ref 13.2–17.1)
LYMPHS ABS: 1363 {cells}/uL (ref 850–3900)
Lymphocytes Relative: 29 %
MCH: 33.6 pg — AB (ref 27.0–33.0)
MCHC: 33.3 g/dL (ref 32.0–36.0)
MCV: 100.8 fL — AB (ref 80.0–100.0)
MPV: 11.6 fL (ref 7.5–12.5)
Monocytes Absolute: 423 cells/uL (ref 200–950)
Monocytes Relative: 9 %
NEUTROS PCT: 60 %
Neutro Abs: 2820 cells/uL (ref 1500–7800)
Platelets: 81 10*3/uL — ABNORMAL LOW (ref 140–400)
RBC: 3.96 MIL/uL — ABNORMAL LOW (ref 4.20–5.80)
RDW: 14.1 % (ref 11.0–15.0)
WBC: 4.7 10*3/uL (ref 3.8–10.8)

## 2016-11-10 LAB — LITHIUM LEVEL: LITHIUM LVL: 1 mmol/L (ref 0.80–1.40)

## 2016-11-10 LAB — PROLACTIN: PROLACTIN: 13.5 ng/mL (ref 2.0–18.0)

## 2016-11-22 ENCOUNTER — Ambulatory Visit (INDEPENDENT_AMBULATORY_CARE_PROVIDER_SITE_OTHER): Payer: BLUE CROSS/BLUE SHIELD | Admitting: Psychiatry

## 2016-11-22 ENCOUNTER — Encounter (HOSPITAL_COMMUNITY): Payer: Self-pay | Admitting: Psychiatry

## 2016-11-22 VITALS — BP 130/72 | HR 63 | Ht 69.5 in | Wt 183.0 lb

## 2016-11-22 DIAGNOSIS — Z87891 Personal history of nicotine dependence: Secondary | ICD-10-CM

## 2016-11-22 DIAGNOSIS — F3162 Bipolar disorder, current episode mixed, moderate: Secondary | ICD-10-CM

## 2016-11-22 NOTE — Progress Notes (Signed)
Patient ID: Angel [name], male   DOB: 01/05/1956, 61 y.o.   MRN: [id number] [facility] MD Progress Note  11/22/2016 4:34 PM Angel [name]  MRN:  [id number] Subjective:  Feeling great Principal Problem: Bipolar disorder, most recent episode depression  Today the patient is seen with his partner. The patient is demonstrating features of hypomania. Is not producing significant dysfunction but he knows this is the beginning of the potential mania. Patient is concerned about depression and being suicidal. He is not suicidal now. He does admit to a state of euphoria and expansive mood. His energy level is dramatically increased compared to his baseline. He's having racing thinking and he has a decreased need for sleep. It is noted that his lithium levels come back as 1. It is noted this patient when he takes a neuroleptic he has significant tardive dyskinesia. The patient has a significant problem with his liver. It should be noted that his platelets are 80,000. All his liver enzymes are normal I suspect this patient has significant liver disease. This may not be reflected his liver enzymes. Past Medical History:  Past Medical History:  Diagnosis Date  . Bipolar disorder ([place])   . 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 Depression   . GERD (gastroesophageal reflux disease)   . Hepatitis B    Hepatitis E. antigen positive by history, status post treatment with Hepsera and baraclude   . 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 Personal history of colonic polyps    Adenomas polyp 2008 and 2010, 2015   . Weight loss    Pt. has lost 10 lbs since pre-visit, intentionally with diet and walking    Past Surgical History:  Procedure Laterality Date  . APPENDECTOMY    . COLONOSCOPY    . POLYPECTOMY    . UPPER GASTROINTESTINAL ENDOSCOPY     Family History:  Family History  Problem Relation Age of Onset  . Heart disease Mother   . Diabetes Father   . Kidney failure Father   . Kidney disease Father   .  Other Sister        GERD  . Colon cancer Neg Hx   . Rectal cancer Neg Hx   . Stomach cancer Neg Hx   . Esophageal cancer Neg Hx    Family Psychiatric  History:  Social History:  History  Alcohol Use No     History  Drug Use No    Social History   Social History  . Marital status: Single    Spouse name: N/A  . Number of children: 0  . Years of education: N/A   Occupational History  . retired Not Employed   Social History Main Topics  . Smoking status: Former Smoker    Types: Cigarettes    Quit date: 05/08/2009  . Smokeless tobacco: Never Used  . Alcohol use No  . Drug use: No  . Sexual activity: Not Currently   Other Topics Concern  . Not on file   Social History Narrative  . No narrative on file   Additional Social History:                         Sleep: Good  Appetite:  Good  Current Medications: Current Outpatient Prescriptions  Medication Sig Dispense Refill  . clonazePAM (KLONOPIN) 1 MG tablet 2  qhs 60 tablet 5  . diphenhydrAMINE (BENADRYL) 25 MG tablet Take 75 mg by mouth at bedtime.    Marland Kitchen  lithium carbonate 300 MG capsule 1 tab po bid for 3 days, then increase to 2 tabs bid, after one week get labs (lithium level) 120 capsule 4  . Melatonin 10 MG SUBL Place 10 mg under the tongue at bedtime.    . nadolol (CORGARD) 40 MG tablet TAKE 1 TABLET BY MOUTH EVERY DAY 30 tablet 0  . pantoprazole (PROTONIX) 40 MG tablet Take 1 tablet (40 mg total) by mouth daily. 90 tablet 3  . traZODone (DESYREL) 50 MG tablet Take 1 tablet at bedtime - may increase to 2 po qhs if needed 60 tablet 3  . venlafaxine XR (EFFEXOR-XR) 150 MG 24 hr capsule Take 1 capsule (150 mg total) by mouth every morning. 30 capsule 8  . VIREAD 300 MG tablet Take 300 mg by mouth daily.      Current Facility-Administered Medications  Medication Dose Route Frequency Provider Last Rate Last Dose  . 0.9 %  sodium chloride infusion  500 mL Intravenous Continuous Armbruster, Renelda Loma, MD       . 0.9 %  sodium chloride infusion  500 mL Intravenous Continuous Armbruster, Renelda Loma, MD        Lab Results: No results found for this or any previous visit (from the past 48 hour(s)).  Physical Findings: AIMS:  , ,  ,  ,    CIWA:    COWS:     Musculoskeletal: Strength & Muscle Tone: within normal limits Gait & Station: normal Patient leans: N/A  Psychiatric Specialty Exam: ROS  There were no vitals taken for this visit.There is no height or weight on file to calculate BMI.  General Appearance: Casual  Eye Contact:: Good   Speech:  Clear and Coherent  Volume:  Normal  Mood:  Euthymic  Affect:  NA and Appropriate  Thought Process:  Coherent  Orientation:  Full (Time, Place, and Person)  Thought Content:  WDL  Suicidal Thoughts:  No  Homicidal Thoughts:  No  Memory:  NA  Judgement:  Good  Insight:  Good  Psychomotor Activity:  Normal  Concentration:  Good  Recall:  Good  Fund of Knowledge:Good  Language: Good  Akathisia:  No  Handed:  Right  AIMS (if indicated):     Assets:  Desire for Improvement  ADL's:  Intact  Cognition: WNL  Sleep:      Treatment Plan Summary: 11/22/2016, At this time the patient continue taking the medicines she's prescribed. This includes Effexor includes lithium 300 mg 2 twice a day. The consideration of using Tegretol should be considered or using Trileptal. At this time I choose to contact his specialist [place] telephone number [phone]. Issues are the safety of giving Tegretol or possibly Trileptal. This patient to return to see me in one week. The patient is not suicidal. I will make a consult with his specialist in [place]. Patient agreed with this intervention as did his partner.

## 2016-11-29 ENCOUNTER — Other Ambulatory Visit (HOSPITAL_COMMUNITY): Payer: Self-pay

## 2016-11-29 ENCOUNTER — Other Ambulatory Visit (HOSPITAL_COMMUNITY): Payer: Self-pay | Admitting: Psychiatry

## 2016-11-29 MED ORDER — CARBAMAZEPINE ER 200 MG PO CP12: 200.0000 mg | ORAL_CAPSULE | Freq: Two times a day (BID) | ORAL | Status: DC

## 2016-11-29 MED ORDER — CARBAMAZEPINE ER 200 MG PO TB12: 200.0000 mg | ORAL_TABLET | Freq: Two times a day (BID) | ORAL | 5 refills | Status: DC

## 2016-11-29 MED ORDER — CARBAMAZEPINE ER 200 MG PO CP12: 200.0000 mg | ORAL_CAPSULE | Freq: Two times a day (BID) | ORAL | 2 refills | Status: DC

## 2016-11-30 ENCOUNTER — Ambulatory Visit (HOSPITAL_COMMUNITY): Payer: Self-pay | Admitting: Psychiatry

## 2016-12-08 ENCOUNTER — Other Ambulatory Visit (HOSPITAL_COMMUNITY): Payer: Self-pay

## 2016-12-13 ENCOUNTER — Telehealth: Payer: Self-pay | Admitting: Family Medicine

## 2016-12-13 NOTE — Telephone Encounter (Signed)
Received fax from [facility] stating that they requested Medical Records for patient yesterday, 12/12/16,  and would like an update on the status of the request. This is first request that has been documented as received and still has been less than 24 hours; paperwork does not state what timeframe they are requesting, will keep eye out for 12/12/16 paperwork today, then will for to [place] for email/scan/SLS 08/08

## 2016-12-13 NOTE — Telephone Encounter (Signed)
[facility] would like to make PCP aware that they faxed over a medical records request form on yesterday. 12/12/16.

## 2016-12-13 NOTE — Telephone Encounter (Signed)
Received request for Medical Records from [facility] [from 12/12/16]; request is for December 05, 2016 and will be forwarded to [place] for [place]. All inquiries on Medical Records should be informed that it is an external process that must be handled via our Medical Records Department/SLS 08/08

## 2016-12-22 NOTE — Telephone Encounter (Signed)
[facility] called in to follow up on request. Forwarded call to medical records to follow up.

## 2016-12-29 ENCOUNTER — Other Ambulatory Visit (HOSPITAL_COMMUNITY): Payer: Self-pay

## 2016-12-29 DIAGNOSIS — Z79899 Other long term (current) drug therapy: Secondary | ICD-10-CM

## 2016-12-29 DIAGNOSIS — K7460 Unspecified cirrhosis of liver: Secondary | ICD-10-CM

## 2016-12-29 LAB — CBC WITH DIFFERENTIAL/PLATELET
BASOS ABS: 0 {cells}/uL (ref 0–200)
Basophils Relative: 0 %
EOS ABS: 53 {cells}/uL (ref 15–500)
EOS PCT: 1 %
HCT: 39.1 % (ref 38.5–50.0)
HEMOGLOBIN: 13 g/dL — AB (ref 13.2–17.1)
LYMPHS ABS: 1219 {cells}/uL (ref 850–3900)
Lymphocytes Relative: 23 %
MCH: 33.4 pg — AB (ref 27.0–33.0)
MCHC: 33.2 g/dL (ref 32.0–36.0)
MCV: 100.5 fL — AB (ref 80.0–100.0)
MPV: 11.1 fL (ref 7.5–12.5)
Monocytes Absolute: 318 cells/uL (ref 200–950)
Monocytes Relative: 6 %
NEUTROS PCT: 70 %
Neutro Abs: 3710 cells/uL (ref 1500–7800)
Platelets: 91 10*3/uL — ABNORMAL LOW (ref 140–400)
RBC: 3.89 MIL/uL — ABNORMAL LOW (ref 4.20–5.80)
RDW: 13.8 % (ref 11.0–15.0)
WBC: 5.3 10*3/uL (ref 3.8–10.8)

## 2016-12-30 LAB — CMP 10231
AG RATIO: 1.6 ratio (ref 1.0–2.5)
ALBUMIN: 3.6 g/dL (ref 3.6–5.1)
ALK PHOS: 77 U/L (ref 40–115)
ALT: 38 U/L (ref 9–46)
AST: 29 U/L (ref 10–35)
BILIRUBIN TOTAL: 0.4 mg/dL (ref 0.2–1.2)
BUN / CREAT RATIO: 14.5 ratio (ref 6–22)
BUN: 12 mg/dL (ref 7–25)
CALCIUM: 8.7 mg/dL (ref 8.6–10.3)
CO2: 26 mmol/L (ref 20–32)
CREATININE: 0.83 mg/dL (ref 0.70–1.25)
Chloride: 106 mmol/L (ref 98–110)
GFR, Est African American: >89 mL/min (ref >=60)
GFR, Est Non African American: >89 mL/min (ref >=60)
GLOBULIN: 2.2 g/dL (ref 1.9–3.7)
Glucose, Bld: 99 mg/dL (ref 65–99)
POTASSIUM: 4 mmol/L (ref 3.5–5.3)
SODIUM: 141 mmol/L (ref 135–146)
Total Protein: 5.8 g/dL — ABNORMAL LOW (ref 6.1–8.1)

## 2016-12-30 LAB — LITHIUM LEVEL: LITHIUM LVL: 0.6 mmol/L — AB (ref 0.80–1.40)

## 2016-12-30 LAB — CARBAMAZEPINE LEVEL, TOTAL: CARBAMAZEPINE LVL: 3.4 mg/L — AB (ref 4.0–12.0)

## 2017-01-03 ENCOUNTER — Ambulatory Visit (INDEPENDENT_AMBULATORY_CARE_PROVIDER_SITE_OTHER): Payer: BLUE CROSS/BLUE SHIELD | Admitting: Psychiatry

## 2017-01-03 DIAGNOSIS — Z87891 Personal history of nicotine dependence: Secondary | ICD-10-CM

## 2017-01-03 DIAGNOSIS — F3162 Bipolar disorder, current episode mixed, moderate: Secondary | ICD-10-CM | POA: Diagnosis not present

## 2017-01-03 MED ORDER — CARBAMAZEPINE ER 200 MG PO CP12: 200.0000 mg | ORAL_CAPSULE | ORAL | Status: DC

## 2017-01-03 NOTE — Progress Notes (Signed)
Patient ID: Angel [name], male   DOB: September 02, 1955, 61 y.o.   MRN: [id number] [facility] MD Progress Note  01/03/2017 3:40 PM Briant Angelillo  MRN:  [id number] Subjective:  Feeling great Principal Problem: Bipolar disorder, most recent episode depression  Today the patient is seen but he comes 20 minutes late. We had his partner care on the phone during the session. For the 15 minutes was spent together was apparently the patient was not much better. He actually is not euphoric. His mood is mildly elevated. His energy level is higher than usual and he describes racing thinking. He is very distractible. It wasn't for his medications for sleep. I'll hard time sleeping. Not sure is truly a manic problem with sleep but is distractibility description of racing thoughts is a concern. There is for her says this is just the way starts. While he is not dysfunctional this time is not his baseline. He's been taking carbamazepine Equetro Past Medical History:  Past Medical History:  Diagnosis Date  . Bipolar disorder ([place])   . 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 Depression   . GERD (gastroesophageal reflux disease)   . Hepatitis B    Hepatitis E. antigen positive by history, status post treatment with Hepsera and baraclude   . 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 Personal history of colonic polyps    Adenomas polyp 2008 and 2010, 2015   . Weight loss    Pt. has lost 10 lbs since pre-visit, intentionally with diet and walking    Past Surgical History:  Procedure Laterality Date  . APPENDECTOMY    . COLONOSCOPY    . POLYPECTOMY    . UPPER GASTROINTESTINAL ENDOSCOPY     Family History:  Family History  Problem Relation Age of Onset  . Heart disease Mother   . Diabetes Father   . Kidney failure Father   . Kidney disease Father   . Other Sister        GERD  . Colon cancer Neg Hx   . Rectal cancer Neg Hx   . Stomach cancer Neg Hx   . Esophageal cancer Neg Hx    Family Psychiatric  History:   Social History:  History  Alcohol Use No     History  Drug Use No    Social History   Social History  . Marital status: Single    Spouse name: N/A  . Number of children: 0  . Years of education: N/A   Occupational History  . retired Not Employed   Social History Main Topics  . Smoking status: Former Smoker    Types: Cigarettes    Quit date: 05/08/2009  . Smokeless tobacco: Never Used  . Alcohol use No  . Drug use: No  . Sexual activity: Not Currently   Other Topics Concern  . Not on file   Social History Narrative  . No narrative on file   Additional Social History:                         Sleep: Good  Appetite:  Good  Current Medications: Current Outpatient Prescriptions  Medication Sig Dispense Refill  . carbamazepine (TEGRETOL-XR) 200 MG 12 hr tablet Take 1 tablet (200 mg total) by mouth 2 (two) times daily. 60 tablet 5  . clonazePAM (KLONOPIN) 1 MG tablet 2  qhs 60 tablet 5  . diphenhydrAMINE (BENADRYL) 25 MG tablet Take 75 mg by mouth at bedtime.    Marland Kitchen  lithium carbonate 300 MG capsule 1 tab po bid for 3 days, then increase to 2 tabs bid, after one week get labs (lithium level) 120 capsule 4  . Melatonin 10 MG SUBL Place 10 mg under the tongue at bedtime.    . nadolol (CORGARD) 40 MG tablet TAKE 1 TABLET BY MOUTH EVERY DAY 30 tablet 0  . pantoprazole (PROTONIX) 40 MG tablet Take 1 tablet (40 mg total) by mouth daily. 90 tablet 3  . traZODone (DESYREL) 50 MG tablet Take 1 tablet at bedtime - may increase to 2 po qhs if needed 60 tablet 3  . venlafaxine XR (EFFEXOR-XR) 150 MG 24 hr capsule Take 1 capsule (150 mg total) by mouth every morning. 30 capsule 8  . VIREAD 300 MG tablet Take 300 mg by mouth daily.      Current Facility-Administered Medications  Medication Dose Route Frequency Provider Last Rate Last Dose  . 0.9 %  sodium chloride infusion  500 mL Intravenous Continuous Armbruster, Carlota Raspberry, MD      . 0.9 %  sodium chloride infusion  500 mL  Intravenous Continuous Armbruster, Carlota Raspberry, MD      . carbamazepine (EQUETRO) 12 hr capsule 200 mg  200 mg Oral BID Markavious Micco, Berneta Sages, MD      . carbamazepine (EQUETRO) 12 hr capsule 200 mg  200 mg Oral UD Tenaya Hilyer, MD        Lab Results: No results found for this or any previous visit (from the past 48 hour(s)).  Physical Findings: AIMS:  , ,  ,  ,    CIWA:    COWS:     Musculoskeletal: Strength & Muscle Tone: within normal limits Gait & Station: normal Patient leans: N/A  Psychiatric Specialty Exam: ROS  There were no vitals taken for this visit.There is no height or weight on file to calculate BMI.  General Appearance: Casual  Eye Contact:: Good   Speech:  Clear and Coherent  Volume:  Normal  Mood:  Euthymic  Affect:  NA and Appropriate  Thought Process:  Coherent  Orientation:  Full (Time, Place, and Person)  Thought Content:  WDL  Suicidal Thoughts:  No  Homicidal Thoughts:  No  Memory:  NA  Judgement:  Good  Insight:  Good  Psychomotor Activity:  Normal  Concentration:  Good  Recall:  Good  Fund of Knowledge:Good  Language: Good  Akathisia:  No  Handed:  Right  AIMS (if indicated):     Assets:  Desire for Improvement  ADL's:  Intact  Cognition: WNL  Sleep:      Treatment Plan Summary: 01/03/2017, At this time the patient continue taking the medicines she's prescribed. This includes Effexor includes lithium 300 mg 2 twice a day. The consideration of using Tegretol should be considered or using Trileptal. At this time I choose to contact his specialist [place] telephone number [phone]. Issues are the safety of giving Tegretol or possibly Trileptal. This patient to return to see me in one week. The patient is not suicidal. I will make a consult with his specialist in [place]. Patient agreed with this intervention as did his partner.

## 2017-01-23 ENCOUNTER — Other Ambulatory Visit (HOSPITAL_COMMUNITY): Payer: Self-pay

## 2017-01-23 MED ORDER — LITHIUM CARBONATE 300 MG PO CAPS: ORAL_CAPSULE | ORAL | 0 refills | Status: DC

## 2017-01-24 ENCOUNTER — Encounter: Payer: Self-pay | Admitting: Family Medicine

## 2017-01-24 ENCOUNTER — Other Ambulatory Visit (HOSPITAL_COMMUNITY): Payer: Self-pay

## 2017-01-24 ENCOUNTER — Telehealth (HOSPITAL_COMMUNITY): Payer: Self-pay

## 2017-01-24 MED ORDER — TRAZODONE HCL 50 MG PO TABS: ORAL_TABLET | ORAL | 3 refills | Status: DC

## 2017-01-24 NOTE — Telephone Encounter (Signed)
Patient is calling about is Carbamazepine, he states at his last appointment you increased to 1 tab in the morning and 2 at bedtime. The script in the chart reads 1 in the morning and 1 at night. Patient will run out early and would like to know if he is taking it correctly and if so he needs a corrected prescription sent to the pharmacy. Please review and advise, thank you

## 2017-01-25 ENCOUNTER — Other Ambulatory Visit: Payer: Self-pay | Admitting: Family Medicine

## 2017-01-30 MED ORDER — TENOFOVIR DISOPROXIL FUMARATE 300 MG TABLET
ORAL_TABLET | Freq: Every day | ORAL | 3 refills | 0.00000 days | Status: CP
Start: 2017-01-30 — End: 2017-10-23

## 2017-02-07 DIAGNOSIS — Z79899 Other long term (current) drug therapy: Secondary | ICD-10-CM | POA: Diagnosis not present

## 2017-02-07 DIAGNOSIS — B181 Chronic viral hepatitis B without delta-agent: Secondary | ICD-10-CM | POA: Diagnosis not present

## 2017-02-09 DIAGNOSIS — F3162 Bipolar disorder, current episode mixed, moderate: Secondary | ICD-10-CM | POA: Diagnosis not present

## 2017-02-10 LAB — COMPLETE METABOLIC PANEL WITH GFR
AG RATIO: 1.5 (calc) (ref 1.0–2.5)
ALBUMIN MSPROF: 3.7 g/dL (ref 3.6–5.1)
ALKALINE PHOSPHATASE (APISO): 90 U/L (ref 40–115)
ALT: 33 U/L (ref 9–46)
AST: 30 U/L (ref 10–35)
BILIRUBIN TOTAL: 0.3 mg/dL (ref 0.2–1.2)
BUN: 13 mg/dL (ref 7–25)
CHLORIDE: 106 mmol/L (ref 98–110)
CO2: 29 mmol/L (ref 20–32)
Calcium: 8.5 mg/dL — ABNORMAL LOW (ref 8.6–10.3)
Creat: 0.9 mg/dL (ref 0.70–1.25)
GFR, EST AFRICAN AMERICAN: 106 mL/min/{1.73_m2} (ref >=60)
GFR, Est Non African American: 92 mL/min/{1.73_m2} (ref >=60)
GLUCOSE: 97 mg/dL (ref 65–99)
Globulin: 2.4 g/dL (calc) (ref 1.9–3.7)
POTASSIUM: 4.3 mmol/L (ref 3.5–5.3)
Sodium: 140 mmol/L (ref 135–146)
Total Protein: 6.1 g/dL (ref 6.1–8.1)

## 2017-02-10 LAB — CARBAMAZEPINE LEVEL, TOTAL: Carbamazepine Lvl: 6.2 mg/L (ref 4.0–12.0)

## 2017-02-14 ENCOUNTER — Ambulatory Visit (INDEPENDENT_AMBULATORY_CARE_PROVIDER_SITE_OTHER): Payer: BLUE CROSS/BLUE SHIELD | Admitting: Psychiatry

## 2017-02-14 DIAGNOSIS — F314 Bipolar disorder, current episode depressed, severe, without psychotic features: Secondary | ICD-10-CM

## 2017-02-14 DIAGNOSIS — F3130 Bipolar disorder, current episode depressed, mild or moderate severity, unspecified: Secondary | ICD-10-CM | POA: Diagnosis not present

## 2017-02-14 DIAGNOSIS — Z87891 Personal history of nicotine dependence: Secondary | ICD-10-CM | POA: Diagnosis not present

## 2017-02-14 DIAGNOSIS — F331 Major depressive disorder, recurrent, moderate: Secondary | ICD-10-CM

## 2017-02-14 DIAGNOSIS — Z79899 Other long term (current) drug therapy: Secondary | ICD-10-CM | POA: Diagnosis not present

## 2017-02-14 MED ORDER — CARBAMAZEPINE ER 200 MG PO TB12: 200.0000 mg | ORAL_TABLET | Freq: Two times a day (BID) | ORAL | 5 refills | Status: DC

## 2017-02-14 MED ORDER — TRAZODONE HCL 50 MG PO TABS: ORAL_TABLET | ORAL | 5 refills | Status: DC

## 2017-02-14 MED ORDER — VENLAFAXINE HCL ER 150 MG PO CP24: 150.0000 mg | ORAL_CAPSULE | Freq: Every morning | ORAL | 8 refills | Status: DC

## 2017-02-14 MED ORDER — LITHIUM CARBONATE 300 MG PO CAPS: ORAL_CAPSULE | ORAL | 7 refills | Status: DC
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
## 2017-02-27 ENCOUNTER — Ambulatory Visit
Admission: RE | Admit: 2017-02-27 | Discharge: 2017-02-27 | Disposition: A | Discharge status: Home / Self Care | Payer: BC Managed Care – PPO | Attending: Gastroenterology | Admitting: Gastroenterology

## 2017-02-27 DIAGNOSIS — Z23 Encounter for immunization: Secondary | ICD-10-CM

## 2017-02-27 DIAGNOSIS — K7460 Unspecified cirrhosis of liver: Principal | ICD-10-CM

## 2017-02-27 DIAGNOSIS — Z79899 Other long term (current) drug therapy: Secondary | ICD-10-CM | POA: Diagnosis not present

## 2017-02-27 DIAGNOSIS — F319 Bipolar disorder, unspecified: Secondary | ICD-10-CM | POA: Diagnosis not present

## 2017-02-27 DIAGNOSIS — B181 Chronic viral hepatitis B without delta-agent: Secondary | ICD-10-CM | POA: Diagnosis not present

## 2017-02-27 DIAGNOSIS — Z6824 Body mass index (BMI) 24.0-24.9, adult: Secondary | ICD-10-CM | POA: Diagnosis not present

## 2017-02-27 DIAGNOSIS — K7469 Other cirrhosis of liver: Secondary | ICD-10-CM | POA: Diagnosis not present

## 2017-02-27 DIAGNOSIS — K766 Portal hypertension: Secondary | ICD-10-CM | POA: Diagnosis not present

## 2017-03-12 ENCOUNTER — Telehealth (HOSPITAL_COMMUNITY): Payer: Self-pay

## 2017-03-12 NOTE — Telephone Encounter (Signed)
Patient is calling because he feels he needs to be seen sooner. Patient is having increased agitation, irritability and headaches. Please review and advise,t hank you

## 2017-03-14 ENCOUNTER — Telehealth (HOSPITAL_COMMUNITY): Payer: Self-pay | Admitting: Psychiatry

## 2017-03-14 NOTE — Telephone Encounter (Signed)
Spoke with Dr. Casimiro Needle and he has openings this Friday, patient information given to Sunday Spillers and she will call patient to get him scheduled

## 2017-03-15 ENCOUNTER — Telehealth (HOSPITAL_COMMUNITY): Payer: Self-pay | Admitting: Psychiatry

## 2017-03-22 ENCOUNTER — Encounter (HOSPITAL_COMMUNITY): Payer: Self-pay | Admitting: Psychiatry

## 2017-03-22 ENCOUNTER — Ambulatory Visit (INDEPENDENT_AMBULATORY_CARE_PROVIDER_SITE_OTHER): Payer: BLUE CROSS/BLUE SHIELD | Admitting: Psychiatry

## 2017-03-22 VITALS — BP 112/60 | HR 59 | Ht 68.0 in | Wt 178.0 lb

## 2017-03-22 DIAGNOSIS — Z79899 Other long term (current) drug therapy: Secondary | ICD-10-CM

## 2017-03-22 DIAGNOSIS — B1920 Unspecified viral hepatitis C without hepatic coma: Secondary | ICD-10-CM | POA: Diagnosis not present

## 2017-03-22 DIAGNOSIS — Z87891 Personal history of nicotine dependence: Secondary | ICD-10-CM

## 2017-03-22 DIAGNOSIS — K7460 Unspecified cirrhosis of liver: Secondary | ICD-10-CM | POA: Diagnosis not present

## 2017-03-22 DIAGNOSIS — F3130 Bipolar disorder, current episode depressed, mild or moderate severity, unspecified: Secondary | ICD-10-CM | POA: Diagnosis not present

## 2017-03-22 DIAGNOSIS — F3110 Bipolar disorder, current episode manic without psychotic features, unspecified: Secondary | ICD-10-CM

## 2017-03-22 DIAGNOSIS — F333 Major depressive disorder, recurrent, severe with psychotic symptoms: Secondary | ICD-10-CM

## 2017-03-22 MED ORDER — LITHIUM CARBONATE 300 MG PO CAPS: ORAL_CAPSULE | ORAL | 7 refills | Status: DC

## 2017-03-22 MED ORDER — LITHIUM CARBONATE 300 MG PO TABS: 300.0000 mg | ORAL_TABLET | Freq: Two times a day (BID) | ORAL | 2 refills | Status: DC

## 2017-03-22 NOTE — Progress Notes (Signed)
Patient ID: Angel [name], male   DOB: [birth year], 61 y.o.   MRN: [id number] [facility] MD Progress Note  03/22/2017 10:37 AM Angel [name]  MRN:  [id number] Subjective:  Feeling great Principal Problem: Bipolar disorder, most recent episode depression  Today the patient is seen with his partner. They described the patient has shown a distinct change in the last 2-4 weeks. He's been more irritable and they've been fighting a lot. He's having episodes where he doesn't seem to be cognitively as intact. He's having problems remembering things. He's describing blackout periods where he doesn't remember what he's done over a period of time. This patient does not drink any alcohol. He claims his last ammonia level and his blood work according her computer is relatively stable. Is not having any seizure activity or any focal neurological deficits. It's mainly description of increased irritability and some cognitive problems. I believe he is having problems focusing and concentrating and I think it sounds more emotional than anything. He's having no neurological complaints no visual problems. Noted is that his lithium level is very low. Noted his Tegretol is relatively low as well. The patient agrees that he is emotionally less stable. He doesn't claim that it's really related to the holidays. His sisters calming and he likes his sister. Generally he does well during the holidays. He's not clear what exactly is the cause of why he does partner or fighting so much but he knows it's because he's just not as cognitively together. I'm not sure if his cognitive problems is causing irritability or the reverse. At this time we'll go ahead and adjust his medicine and see him back quick. Past Medical History:  Past Medical History:  Diagnosis Date  . Bipolar disorder ([place])   . 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 Depression   . GERD (gastroesophageal reflux disease)   . Hepatitis B    Hepatitis E. antigen  positive by history, status post treatment with Hepsera and baraclude   . Hepatitis B carrier ([place])   . Personal history of colonic polyps    Adenomas polyp 2008 and 2010, 2015   . Weight loss    Pt. has lost 10 lbs since pre-visit, intentionally with diet and walking    Past Surgical History:  Procedure Laterality Date  . APPENDECTOMY    . COLONOSCOPY    . POLYPECTOMY    . UPPER GASTROINTESTINAL ENDOSCOPY     Family History:  Family History  Problem Relation Age of Onset  . Heart disease Mother   . Diabetes Father   . Kidney failure Father   . Kidney disease Father   . Other Sister        GERD  . Colon cancer Neg Hx   . Rectal cancer Neg Hx   . Stomach cancer Neg Hx   . Esophageal cancer Neg Hx    Family Psychiatric  History:  Social History:  Social History   Substance and Sexual Activity  Alcohol Use No  . Alcohol/week: 0.0 oz     Social History   Substance and Sexual Activity  Drug Use No    Social History   Socioeconomic History  . Marital status: Single    Spouse name: None  . Number of children: 0  . Years of education: None  . Highest education level: None  Social Needs  . Financial resource strain: None  . Food insecurity - worry: None  . Food insecurity - inability: None  .  Transportation needs - medical: None  . Transportation needs - non-medical: None  Occupational History  . Occupation: retired    Fish farm manager: NOT EMPLOYED  Tobacco Use  . Smoking status: Former Smoker    Types: Cigarettes    Last attempt to quit: 05/08/2009    Years since quitting: 7.8  . Smokeless tobacco: Never Used  Substance and Sexual Activity  . Alcohol use: No    Alcohol/week: 0.0 oz  . Drug use: No  . Sexual activity: Not Currently  Other Topics Concern  . None  Social History Narrative  . None   Additional Social History:                         Sleep: Good  Appetite:  Good  Current Medications: Current Outpatient Medications  Medication Sig  Dispense Refill  . carbamazepine (TEGRETOL-XR) 200 MG 12 hr tablet Take 1 tablet (200 mg total) by mouth 2 (two) times daily. 90 tablet 5  . clonazePAM (KLONOPIN) 1 MG tablet 2  qhs 60 tablet 5  . diphenhydrAMINE (BENADRYL) 25 MG tablet Take 75 mg by mouth at bedtime.    Marland Kitchen lithium 300 MG tablet Take 1 tablet (300 mg total) 2 (two) times daily by mouth. 60 tablet 2  . lithium carbonate 300 MG capsule 3  bid 180 capsule 7  . Melatonin 10 MG SUBL Place 10 mg under the tongue at bedtime.    . nadolol (CORGARD) 40 MG tablet TAKE 1 TABLET BY MOUTH EVERY DAY 30 tablet 0  . pantoprazole (PROTONIX) 40 MG tablet Take 1 tablet (40 mg total) by mouth daily. 90 tablet 3  . traZODone (DESYREL) 50 MG tablet Take 1 tablet at bedtime - may increase to 2 po qhs if needed 60 tablet 5  . venlafaxine XR (EFFEXOR-XR) 150 MG 24 hr capsule Take 1 capsule (150 mg total) by mouth every morning. 30 capsule 8  . VIREAD 300 MG tablet Take 300 mg by mouth daily.      Current Facility-Administered Medications  Medication Dose Route Frequency Provider Last Rate Last Dose  . carbamazepine (EQUETRO) 12 hr capsule 200 mg  200 mg Oral BID Nimai Burbach, MD      . carbamazepine (EQUETRO) 12 hr capsule 200 mg  200 mg Oral UD Leighann Amadon, MD        Lab Results: No results found for this or any previous visit (from the past 48 hour(s)).  Physical Findings: AIMS:  , ,  ,  ,    CIWA:    COWS:     Musculoskeletal: Strength & Muscle Tone: within normal limits Gait & Station: normal Patient leans: N/A  Psychiatric Specialty Exam: ROS  Blood pressure 112/60, pulse (!) 59, height 5\' 8"  (1.727 m), weight 178 lb (80.7 kg).Body mass index is 27.06 kg/m.  General Appearance: Casual  Eye Contact:: Good   Speech:  Clear and Coherent  Volume:  Normal  Mood:  Euthymic  Affect:  NA and Appropriate  Thought Process:  Coherent  Orientation:  Full (Time, Place, and Person)  Thought Content:  WDL  Suicidal Thoughts:  No   Homicidal Thoughts:  No  Memory:  NA  Judgement:  Good  Insight:  Good  Psychomotor Activity:  Normal  Concentration:  Good  Recall:  Good  Fund of Knowledge:Good  Language: Good  Akathisia:  No  Handed:  Right  AIMS (if indicated):     Assets:  Desire for  Improvement  ADL's:  Intact  Cognition: WNL  Sleep:      Treatment Plan Summary: 03/22/2017,  At this timeshe'll increase his lithium. He's taking 300 mg 2 in the morning and 2 at night. He'll increase it to taking 2 in the morning and 3 at night for one week and then increase to taking 3 in the morning and 3 at night. A weekly later after on a higher dose get a lithium blood level. All his other blood work is normal. His Tegretol level is on the low normal side. I choose in this case to increase his lithium as both of them are concerned about him getting more depressed during the holidays. Also he's had issues with his liver specifically hepatitis C and cirrhosis. Therefore I am hesitant to increase his Tegretol and feel it would be safer to increase his lithium as his renal function is always been normal. Therefore the combination of a higher dose of lithium together with his Tegretol together with a fixed dose of Effexor and trazodone is the interventions will do today. This patient to return to see me in 6 weeks. This patient is not suicidal. Continues to function fairly well. It should be noted that he does have an environmental disturbance in that he has a lot of workers in his home. They're using a lot of chemicals to clean things and I wonder if this is not having some impact on this patient's ability. This patient to return to see me in 6 weeks and by that time hopefully the workers will be gone and the lithium be therapeutic and helpful.

## 2017-03-27 MED ORDER — NADOLOL 40 MG TABLET: 40 mg | tablet | Freq: Every day | 3 refills | 0 days | Status: AC

## 2017-03-27 MED ORDER — NADOLOL 40 MG TABLET
ORAL_TABLET | Freq: Every day | ORAL | 3 refills | 0.00000 days | Status: CP
Start: 2017-03-27 — End: 2017-03-27

## 2017-04-06 ENCOUNTER — Other Ambulatory Visit (HOSPITAL_COMMUNITY): Payer: Self-pay

## 2017-04-06 ENCOUNTER — Ambulatory Visit (HOSPITAL_COMMUNITY): Payer: BLUE CROSS/BLUE SHIELD

## 2017-04-06 DIAGNOSIS — Z79899 Other long term (current) drug therapy: Secondary | ICD-10-CM

## 2017-04-07 LAB — LITHIUM LEVEL: Lithium Lvl: 1.1 mmol/L (ref 0.6–1.2)

## 2017-04-09 ENCOUNTER — Other Ambulatory Visit (HOSPITAL_COMMUNITY): Payer: Self-pay

## 2017-04-09 DIAGNOSIS — F330 Major depressive disorder, recurrent, mild: Secondary | ICD-10-CM

## 2017-05-09 DIAGNOSIS — G062 Extradural and subdural abscess, unspecified: Secondary | ICD-10-CM | POA: Insufficient documentation

## 2017-05-18 ENCOUNTER — Encounter (HOSPITAL_COMMUNITY): Payer: Self-pay | Admitting: Psychiatry

## 2017-05-18 ENCOUNTER — Ambulatory Visit (INDEPENDENT_AMBULATORY_CARE_PROVIDER_SITE_OTHER): Payer: BLUE CROSS/BLUE SHIELD | Admitting: Psychiatry

## 2017-05-18 VITALS — BP 124/78 | HR 62 | Ht 70.5 in | Wt 176.6 lb

## 2017-05-18 DIAGNOSIS — Z87891 Personal history of nicotine dependence: Secondary | ICD-10-CM | POA: Diagnosis not present

## 2017-05-18 DIAGNOSIS — F3130 Bipolar disorder, current episode depressed, mild or moderate severity, unspecified: Secondary | ICD-10-CM | POA: Diagnosis not present

## 2017-05-18 DIAGNOSIS — F3162 Bipolar disorder, current episode mixed, moderate: Secondary | ICD-10-CM

## 2017-05-18 DIAGNOSIS — F3110 Bipolar disorder, current episode manic without psychotic features, unspecified: Secondary | ICD-10-CM

## 2017-05-18 DIAGNOSIS — F330 Major depressive disorder, recurrent, mild: Secondary | ICD-10-CM

## 2017-05-18 MED ORDER — CLONAZEPAM 1 MG PO TABS: ORAL_TABLET | ORAL | 5 refills | Status: DC

## 2017-05-18 MED ORDER — LITHIUM CARBONATE 300 MG PO TABS: 300.0000 mg | ORAL_TABLET | Freq: Two times a day (BID) | ORAL | 2 refills | Status: DC

## 2017-05-18 MED ORDER — LITHIUM CARBONATE 600 MG PO CAPS: ORAL_CAPSULE | ORAL | 3 refills | Status: DC
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
## 2017-05-23 ENCOUNTER — Ambulatory Visit (HOSPITAL_COMMUNITY): Payer: Self-pay | Admitting: Psychiatry

## 2017-05-29 ENCOUNTER — Other Ambulatory Visit (HOSPITAL_COMMUNITY): Payer: Self-pay

## 2017-05-29 DIAGNOSIS — Z79899 Other long term (current) drug therapy: Secondary | ICD-10-CM

## 2017-05-30 LAB — LITHIUM LEVEL: LITHIUM LVL: 1 mmol/L (ref 0.6–1.2)

## 2017-05-31 ENCOUNTER — Ambulatory Visit (INDEPENDENT_AMBULATORY_CARE_PROVIDER_SITE_OTHER): Payer: BLUE CROSS/BLUE SHIELD | Admitting: Psychiatry

## 2017-05-31 DIAGNOSIS — F3130 Bipolar disorder, current episode depressed, mild or moderate severity, unspecified: Secondary | ICD-10-CM

## 2017-05-31 DIAGNOSIS — Z87891 Personal history of nicotine dependence: Secondary | ICD-10-CM | POA: Diagnosis not present

## 2017-05-31 DIAGNOSIS — Z79899 Other long term (current) drug therapy: Secondary | ICD-10-CM | POA: Diagnosis not present

## 2017-05-31 DIAGNOSIS — F330 Major depressive disorder, recurrent, mild: Secondary | ICD-10-CM

## 2017-05-31 DIAGNOSIS — F3110 Bipolar disorder, current episode manic without psychotic features, unspecified: Secondary | ICD-10-CM

## 2017-05-31 MED ORDER — CLONAZEPAM 1 MG PO TABS: ORAL_TABLET | ORAL | 5 refills | Status: DC

## 2017-05-31 NOTE — Progress Notes (Signed)
Patient ID: Angel [name], male   DOB: 21-Sep-1955, 62 y.o.   MRN: [id number] [facility] MD Progress Note  05/31/2017 4:22 PM Angel [name]  MRN:  [id number] Subjective:  Feeling great Principal Problem: Bipolar disorder, most recent episode depression Today the patient is seen with his partner. The patient is much improved.Since increasing his lithium to a dose of 600 mg 1 in the morning 2 at night his mood is improved. Is much less irritable actually has better sleep at night. He's no longer is tenderness or irritable. He is more comfortable. On this higher dose of lithium his blood level came back at 1. The patient feels much improved.His partner agrees.The patient is active engageable. He denies being depressed. Past Medical History:  Past Medical History:  Diagnosis Date  . Bipolar disorder ([place])   . 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 Depression   . GERD (gastroesophageal reflux disease)   . Hepatitis B    Hepatitis E. antigen positive by history, status post treatment with Hepsera and baraclude   . 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 Personal history of colonic polyps    Adenomas polyp 2008 and 2010, 2015   . Weight loss    Pt. has lost 10 lbs since pre-visit, intentionally with diet and walking    Past Surgical History:  Procedure Laterality Date  . APPENDECTOMY    . COLONOSCOPY    . POLYPECTOMY    . UPPER GASTROINTESTINAL ENDOSCOPY     Family History:  Family History  Problem Relation Age of Onset  . Heart disease Mother   . Diabetes Father   . Kidney failure Father   . Kidney disease Father   . Other Sister        GERD  . Colon cancer Neg Hx   . Rectal cancer Neg Hx   . Stomach cancer Neg Hx   . Esophageal cancer Neg Hx    Family Psychiatric  History:  Social History:  Social History   Substance and Sexual Activity  Alcohol Use No  . Alcohol/week: 0.0 oz     Social History   Substance and Sexual Activity  Drug Use No    Social History   Socioeconomic  History  . Marital status: Single    Spouse name: Not on file  . Number of children: 0  . Years of education: Not on file  . Highest education level: Not on file  Social Needs  . Financial resource strain: Not on file  . Food insecurity - worry: Not on file  . Food insecurity - inability: Not on file  . Transportation needs - medical: Not on file  . Transportation needs - non-medical: Not on file  Occupational History  . Occupation: retired    Fish farm manager: NOT EMPLOYED  Tobacco Use  . Smoking status: Former Smoker    Types: Cigarettes    Last attempt to quit: 05/08/2009    Years since quitting: 8.0  . Smokeless tobacco: Never Used  Substance and Sexual Activity  . Alcohol use: No    Alcohol/week: 0.0 oz  . Drug use: No  . Sexual activity: Not Currently  Other Topics Concern  . Not on file  Social History Narrative  . Not on file   Additional Social History:                         Sleep: Good  Appetite:  Good  Current Medications: Current Outpatient  Medications  Medication Sig Dispense Refill  . carbamazepine (TEGRETOL-XR) 200 MG 12 hr tablet Take 1 tablet (200 mg total) by mouth 2 (two) times daily. 90 tablet 5  . clonazePAM (KLONOPIN) 1 MG tablet 2  qhs 60 tablet 5  . diphenhydrAMINE (BENADRYL) 25 MG tablet Take 75 mg by mouth at bedtime.    Marland Kitchen lithium 300 MG tablet Take 1 tablet (300 mg total) by mouth 2 (two) times daily. 60 tablet 2  . lithium 600 MG capsule 1 qam   2  qhs 90 capsule 3  . Melatonin 10 MG SUBL Place 10 mg under the tongue at bedtime.    . nadolol (CORGARD) 40 MG tablet TAKE 1 TABLET BY MOUTH EVERY DAY 30 tablet 0  . pantoprazole (PROTONIX) 40 MG tablet Take 1 tablet (40 mg total) by mouth daily. 90 tablet 3  . traZODone (DESYREL) 50 MG tablet Take 1 tablet at bedtime - may increase to 2 po qhs if needed 60 tablet 5  . venlafaxine XR (EFFEXOR-XR) 150 MG 24 hr capsule Take 1 capsule (150 mg total) by mouth every morning. 30 capsule 8  . VIREAD  300 MG tablet Take 300 mg by mouth daily.      Current Facility-Administered Medications  Medication Dose Route Frequency Provider Last Rate Last Dose  . carbamazepine (EQUETRO) 12 hr capsule 200 mg  200 mg Oral BID Roberta Angell, MD      . carbamazepine (EQUETRO) 12 hr capsule 200 mg  200 mg Oral UD Braulio Kiedrowski, MD        Lab Results: No results found for this or any previous visit (from the past 48 hour(s)).  Physical Findings: AIMS:  , ,  ,  ,    CIWA:    COWS:     Musculoskeletal: Strength & Muscle Tone: within normal limits Gait & Station: normal Patient leans: N/A  Psychiatric Specialty Exam: ROS  There were no vitals taken for this visit.There is no height or weight on file to calculate BMI.  General Appearance: Casual  Eye Contact:: Good   Speech:  Clear and Coherent  Volume:  Normal  Mood:  Euthymic  Affect:  NA and Appropriate  Thought Process:  Coherent  Orientation:  Full (Time, Place, and Person)  Thought Content:  WDL  Suicidal Thoughts:  No  Homicidal Thoughts:  No  Memory:  NA  Judgement:  Good  Insight:  Good  Psychomotor Activity:  Normal  Concentration:  Good  Recall:  Good  Fund of Knowledge:Good  Language: Good  Akathisia:  No  Handed:  Right  AIMS (if indicated):     Assets:  Desire for Improvement  ADL's:  Intact  Cognition: WNL  Sleep:      Treatment Plan Summary: 05/31/2017,  At this time the patient is doing better.the increase of lithium did help a great deal. Now takes a dose that is in the level I. Today will continue taking Klonopin 2 mg there is some issues about him taking his medicines in a consistent way that seems to be resolved. He takes Tegretol 1 morning 2 at night. He takes 3 that he was in the morning and 3 at night. He'll continue taking his Effexor as prescribed. Patient is doing well. Return to see me in 2 months.

## 2017-06-07 ENCOUNTER — Encounter: Payer: Self-pay | Admitting: Family Medicine

## 2017-07-05 ENCOUNTER — Other Ambulatory Visit (HOSPITAL_COMMUNITY): Payer: Self-pay | Admitting: Psychiatry

## 2017-07-10 ENCOUNTER — Other Ambulatory Visit (HOSPITAL_COMMUNITY): Payer: Self-pay

## 2017-07-10 MED ORDER — TRAZODONE HCL 50 MG PO TABS: ORAL_TABLET | ORAL | 3 refills | Status: DC

## 2017-07-10 MED ORDER — CARBAMAZEPINE ER 200 MG PO TB12: ORAL_TABLET | ORAL | 2 refills | Status: DC

## 2017-07-28 ENCOUNTER — Encounter: Payer: Self-pay | Admitting: Family Medicine

## 2017-08-01 ENCOUNTER — Ambulatory Visit (INDEPENDENT_AMBULATORY_CARE_PROVIDER_SITE_OTHER): Payer: BLUE CROSS/BLUE SHIELD | Admitting: Psychiatry

## 2017-08-01 ENCOUNTER — Encounter (HOSPITAL_COMMUNITY): Payer: Self-pay | Admitting: Psychiatry

## 2017-08-01 VITALS — BP 139/78 | HR 90 | Ht 70.0 in | Wt 199.0 lb

## 2017-08-01 DIAGNOSIS — F3110 Bipolar disorder, current episode manic without psychotic features, unspecified: Secondary | ICD-10-CM

## 2017-08-01 DIAGNOSIS — Z87891 Personal history of nicotine dependence: Secondary | ICD-10-CM

## 2017-08-01 DIAGNOSIS — F330 Major depressive disorder, recurrent, mild: Secondary | ICD-10-CM

## 2017-08-01 DIAGNOSIS — B1910 Unspecified viral hepatitis B without hepatic coma: Secondary | ICD-10-CM

## 2017-08-01 DIAGNOSIS — F331 Major depressive disorder, recurrent, moderate: Secondary | ICD-10-CM

## 2017-08-01 MED ORDER — CARBAMAZEPINE ER 200 MG PO TB12: ORAL_TABLET | ORAL | 2 refills | Status: DC

## 2017-08-01 MED ORDER — VENLAFAXINE HCL ER 150 MG PO CP24: 150.0000 mg | ORAL_CAPSULE | Freq: Every morning | ORAL | 8 refills | Status: DC

## 2017-08-01 MED ORDER — TRAZODONE HCL 50 MG PO TABS: ORAL_TABLET | ORAL | 3 refills | Status: DC

## 2017-08-01 MED ORDER — LITHIUM CARBONATE 600 MG PO CAPS: ORAL_CAPSULE | ORAL | 5 refills | Status: DC

## 2017-08-01 MED ORDER — CARBAMAZEPINE ER 200 MG PO CP12: 200.0000 mg | ORAL_CAPSULE | Freq: Two times a day (BID) | ORAL | Status: DC

## 2017-08-01 MED ORDER — CLONAZEPAM 1 MG PO TABS: ORAL_TABLET | ORAL | 5 refills | Status: DC

## 2017-08-01 NOTE — Progress Notes (Signed)
Patient ID: Angel [name], male   DOB: 27-Nov-1955, 62 y.o.   MRN: [id number] [facility] MD Progress Note  08/01/2017 4:08 PM Helmuth Recupero  MRN:  [id number] Subjective:  Feeling great Principal Problem: Bipolar disorder, most recent episode depression Today the patient is seen with his partner care. [name] patient is doing well. His mood is stable. He shows no signs of mania. His sleep is a little erratic but is not really that big of a problem. He's eating well has good energy enjoying life. He uses no alcohol or drugs. His hepatitis B cirrhosis is stable. Medically he is feeling well. Initially he stable. He's got good relationship with his partner with others around him. There is no evidence of alcohol or drug use in this individual. He takes his medicines just as prescribed. Past Medical History:  Past Medical History:  Diagnosis Date  . Bipolar disorder ([place])   . 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 Depression   . GERD (gastroesophageal reflux disease)   . Hepatitis B    Hepatitis E. antigen positive by history, status post treatment with Hepsera and baraclude   . 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 Personal history of colonic polyps    Adenomas polyp 2008 and 2010, 2015   . Weight loss    Pt. has lost 10 lbs since pre-visit, intentionally with diet and walking    Past Surgical History:  Procedure Laterality Date  . APPENDECTOMY    . COLONOSCOPY    . POLYPECTOMY    . UPPER GASTROINTESTINAL ENDOSCOPY     Family History:  Family History  Problem Relation Age of Onset  . Heart disease Mother   . Diabetes Father   . Kidney failure Father   . Kidney disease Father   . Other Sister        GERD  . Colon cancer Neg Hx   . Rectal cancer Neg Hx   . Stomach cancer Neg Hx   . Esophageal cancer Neg Hx    Family Psychiatric  History:  Social History:  Social History   Substance and Sexual Activity  Alcohol Use No  . Alcohol/week: 0.0 oz     Social History   Substance and  Sexual Activity  Drug Use No    Social History   Socioeconomic History  . Marital status: Single    Spouse name: Not on file  . Number of children: 0  . Years of education: Not on file  . Highest education level: Not on file  Occupational History  . 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 Financial resource strain: Not on file  . Food insecurity:    Worry: Not on file    Inability: Not on file  . Transportation needs:    Medical: Not on file    Non-medical: Not on file  Tobacco Use  . Smoking status: Former Smoker    Types: Cigarettes    Last attempt to quit: 05/08/2009    Years since quitting: 8.2  . Smokeless tobacco: Never Used  Substance and Sexual Activity  . Alcohol use: No    Alcohol/week: 0.0 oz  . Drug use: No  . Sexual activity: Not Currently  Lifestyle  . Physical activity:    Days per week: Not on file    Minutes per session: Not on file  . Stress: Not on file  Relationships  . Social connections:    Talks on phone: Not  on file    Gets together: Not on file    Attends religious service: Not on file    Active member of club or organization: Not on file    Attends meetings of clubs or organizations: Not on file    Relationship status: Not on file  Other Topics Concern  . Not on file  Social History Narrative  . Not on file   Additional Social History:                         Sleep: Good  Appetite:  Good  Current Medications: Current Outpatient Medications  Medication Sig Dispense Refill  . aspirin EC 81 MG tablet Take 81 mg by mouth daily.    . carbamazepine (TEGRETOL-XR) 200 MG 12 hr tablet Take 1 tablet by mouth qam and 2 tablets by mouth qhs 90 tablet 2  . clonazePAM (KLONOPIN) 1 MG tablet 2  qhs 60 tablet 5  . diphenhydrAMINE (BENADRYL) 25 MG tablet Take 75 mg by mouth at bedtime.    Marland Kitchen lithium 300 MG tablet Take 1 tablet (300 mg total) by mouth 2 (two) times daily. 60 tablet 2  . lithium 600 MG capsule 1  qam   2  qhs 90 capsule 5  . Melatonin 10 MG SUBL Place 10 mg under the tongue at bedtime.    . nadolol (CORGARD) 40 MG tablet TAKE 1 TABLET BY MOUTH EVERY DAY 30 tablet 0  . pantoprazole (PROTONIX) 40 MG tablet Take 1 tablet (40 mg total) by mouth daily. 90 tablet 3  . traZODone (DESYREL) 50 MG tablet Take 1 tablet at bedtime - may increase to 2 po qhs if needed 60 tablet 3  . venlafaxine XR (EFFEXOR-XR) 150 MG 24 hr capsule Take 1 capsule (150 mg total) by mouth every morning. 30 capsule 8  . VIREAD 300 MG tablet Take 300 mg by mouth daily.      Current Facility-Administered Medications  Medication Dose Route Frequency Provider Last Rate Last Dose  . carbamazepine (EQUETRO) 12 hr capsule 200 mg  200 mg Oral BID Johneisha Broaden, MD        Lab Results: No results found for this or any previous visit (from the past 48 hour(s)).  Physical Findings: AIMS:  , ,  ,  ,    CIWA:    COWS:     Musculoskeletal: Strength & Muscle Tone: within normal limits Gait & Station: normal Patient leans: N/A  Psychiatric Specialty Exam: ROS  Blood pressure 139/78, pulse 90, height 5\' 10"  (1.778 m), weight 199 lb (90.3 kg), SpO2 96 %.Body mass index is 28.55 kg/m.  General Appearance: Casual  Eye Contact:: Good   Speech:  Clear and Coherent  Volume:  Normal  Mood:  Euthymic  Affect:  NA and Appropriate  Thought Process:  Coherent  Orientation:  Full (Time, Place, and Person)  Thought Content:  WDL  Suicidal Thoughts:  No  Homicidal Thoughts:  No  Memory:  NA  Judgement:  Good  Insight:  Good  Psychomotor Activity:  Normal  Concentration:  Good  Recall:  Good  Fund of Knowledge:Good  Language: Good  Akathisia:  No  Handed:  Right  AIMS (if indicated):     Assets:  Desire for Improvement  ADL's:  Intact  Cognition: WNL  Sleep:      Treatment Plan Summary: 08/01/2017,  At this time the patient takes lithium 600 mg 1 in the morning and  2 at night he continues taking trazodone for sleep  and continues Tegretol as prescribed. He no longer takes any neuroleptics. He described a little bit of a movie or sound disorder that might be related to tardive dyskinesia. Today had aims scale showed no evidence of TD. The patient continue taking his Effexor as prescribed continue all the other medicines. Patient actually is very stable. Return to see me in 3 months.

## 2017-09-04 ENCOUNTER — Ambulatory Visit (INDEPENDENT_AMBULATORY_CARE_PROVIDER_SITE_OTHER): Payer: BLUE CROSS/BLUE SHIELD | Admitting: Psychiatry

## 2017-09-04 ENCOUNTER — Encounter (HOSPITAL_COMMUNITY): Payer: Self-pay | Admitting: Psychiatry

## 2017-09-04 VITALS — BP 128/79 | HR 61 | Ht 70.0 in | Wt 195.0 lb

## 2017-09-04 DIAGNOSIS — K7460 Unspecified cirrhosis of liver: Secondary | ICD-10-CM | POA: Diagnosis not present

## 2017-09-04 DIAGNOSIS — Z87891 Personal history of nicotine dependence: Secondary | ICD-10-CM

## 2017-09-04 DIAGNOSIS — B1910 Unspecified viral hepatitis B without hepatic coma: Secondary | ICD-10-CM

## 2017-09-04 DIAGNOSIS — F320 Major depressive disorder, single episode, mild: Secondary | ICD-10-CM | POA: Diagnosis not present

## 2017-09-04 MED ORDER — TRAZODONE HCL 50 MG PO TABS: ORAL_TABLET | ORAL | 3 refills | Status: DC

## 2017-09-04 NOTE — Progress Notes (Signed)
Patient ID: Angel [name], male   DOB: 1955/12/30, 62 y.o.   MRN: [id number] [facility] MD Progress Note  09/04/2017 4:02 PM Angel [name]  MRN:  [id number] Subjective:  Feeling great Principal Problem: Bipolar disorder, most recent episode depression  Today the patient is seen in the emergency visit. He's with his partner care. Apparently had a confrontation his partner. His Angel [name] left the door open so that her dogs got out. This is happened before in this patient decided to terminate the [place]. Apparently this Angel [name] got aggressive with him. There is a lot of cursing and yelling. The police were involved. The patient feels frightened and scared. He feels intimidated. Unfortunately this Angel [name] works in a home in his neighborhood. The patient is considering getting a restraining order. The recommendations are that he gets a lawyer to help this. His partner air is very supportive. Her cholecystectomy the patient feels safe and secure in his home. He feels threatened by  His Angel [name]. As result patient is not sleeping patient is not in therapy time. Past Medical History:  Past Medical History:  Diagnosis Date  . Bipolar disorder ([place])   . 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 Depression   . GERD (gastroesophageal reflux disease)   . Hepatitis B    Hepatitis E. antigen positive by history, status post treatment with Hepsera and baraclude   . 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 Personal history of colonic polyps    Adenomas polyp 2008 and 2010, 2015   . Weight loss    Pt. has lost 10 lbs since pre-visit, intentionally with diet and walking    Past Surgical History:  Procedure Laterality Date  . APPENDECTOMY    . COLONOSCOPY    . POLYPECTOMY    . UPPER GASTROINTESTINAL ENDOSCOPY     Family History:  Family History  Problem Relation Age of Onset  . Heart disease Mother   . Diabetes Father   . Kidney failure Father   . Kidney disease Father   . Other Sister        GERD  . Colon  cancer Neg Hx   . Rectal cancer Neg Hx   . Stomach cancer Neg Hx   . Esophageal cancer Neg Hx    Family Psychiatric  History:  Social History:  Social History   Substance and Sexual Activity  Alcohol Use No  . Alcohol/week: 0.0 oz     Social History   Substance and Sexual Activity  Drug Use No    Social History   Socioeconomic History  . Marital status: Single    Spouse name: Not on file  . Number of children: 0  . Years of education: Not on file  . Highest education level: Not on file  Occupational History  . 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 Financial resource strain: Not on file  . Food insecurity:    Worry: Not on file    Inability: Not on file  . Transportation needs:    Medical: Not on file    Non-medical: Not on file  Tobacco Use  . Smoking status: Former Smoker    Types: Cigarettes    Last attempt to quit: 05/08/2009    Years since quitting: 8.3  . Smokeless tobacco: Never Used  Substance and Sexual Activity  . Alcohol use: No    Alcohol/week: 0.0 oz  . Drug use: No  . Sexual activity: Not Currently  Lifestyle  .  Physical activity:    Days per week: Not on file    Minutes per session: Not on file  . Stress: Not on file  Relationships  . Social connections:    Talks on phone: Not on file    Gets together: Not on file    Attends religious service: Not on file    Active member of club or organization: Not on file    Attends meetings of clubs or organizations: Not on file    Relationship status: Not on file  Other Topics Concern  . Not on file  Social History Narrative  . Not on file   Additional Social History:                         Sleep: Good  Appetite:  Good  Current Medications: Current Outpatient Medications  Medication Sig Dispense Refill  . aspirin EC 81 MG tablet Take 81 mg by mouth daily.    . carbamazepine (TEGRETOL-XR) 200 MG 12 hr tablet Take 1 tablet by mouth qam and 2 tablets by  mouth qhs 90 tablet 2  . clonazePAM (KLONOPIN) 1 MG tablet 2  qhs 60 tablet 5  . diphenhydrAMINE (BENADRYL) 25 MG tablet Take 75 mg by mouth at bedtime.    Marland Kitchen lithium 300 MG tablet Take 1 tablet (300 mg total) by mouth 2 (two) times daily. 60 tablet 2  . lithium 600 MG capsule 1 qam   2  qhs 90 capsule 5  . Melatonin 10 MG SUBL Place 10 mg under the tongue at bedtime.    . nadolol (CORGARD) 40 MG tablet TAKE 1 TABLET BY MOUTH EVERY DAY 30 tablet 0  . pantoprazole (PROTONIX) 40 MG tablet Take 1 tablet (40 mg total) by mouth daily. 90 tablet 3  . traZODone (DESYREL) 50 MG tablet Take 1 tablet at bedtime - may increase to 2 po qhs if needed 60 tablet 3  . venlafaxine XR (EFFEXOR-XR) 150 MG 24 hr capsule Take 1 capsule (150 mg total) by mouth every morning. 30 capsule 8  . VIREAD 300 MG tablet Take 300 mg by mouth daily.      Current Facility-Administered Medications  Medication Dose Route Frequency Provider Last Rate Last Dose  . carbamazepine (EQUETRO) 12 hr capsule 200 mg  200 mg Oral BID Lavonn Maxcy, MD        Lab Results: No results found for this or any previous visit (from the past 48 hour(s)).  Physical Findings: AIMS:  , ,  ,  ,    CIWA:    COWS:     Musculoskeletal: Strength & Muscle Tone: within normal limits Gait & Station: normal Patient leans: N/A  Psychiatric Specialty Exam: Angel [name] is resistant Patient ID: Angel [name], male   DOB: 03-09-1956, 62 y.o.   MRN: [id number] [facility] MD Progress Note  09/04/2017 4:03 PM Angel [name]  MRN:  [id number] Subjective:  Feeling great Principal Problem: Bipolar disorder, most recent episode depression Today the patient is seen with his partner care. Angel [name] patient is doing well. His mood is stable. He shows no signs of mania. His sleep is a little erratic but is not really that big of a problem. He's eating well has good energy enjoying life. He uses no alcohol or drugs. His hepatitis B cirrhosis is stable. Medically he is feeling  well. Initially he stable. He's got good relationship with his partner with others around him. There is no evidence of  alcohol or drug use in this individual. He takes his medicines just as prescribed. Past Medical History:  Past Medical History:  Diagnosis Date  . Bipolar disorder ([place])   . 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 Depression   . GERD (gastroesophageal reflux disease)   . Hepatitis B    Hepatitis E. antigen positive by history, status post treatment with Hepsera and baraclude   . 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 Personal history of colonic polyps    Adenomas polyp 2008 and 2010, 2015   . Weight loss    Pt. has lost 10 lbs since pre-visit, intentionally with diet and walking    Past Surgical History:  Procedure Laterality Date  . APPENDECTOMY    . COLONOSCOPY    . POLYPECTOMY    . UPPER GASTROINTESTINAL ENDOSCOPY     Family History:  Family History  Problem Relation Age of Onset  . Heart disease Mother   . Diabetes Father   . Kidney failure Father   . Kidney disease Father   . Other Sister        GERD  . Colon cancer Neg Hx   . Rectal cancer Neg Hx   . Stomach cancer Neg Hx   . Esophageal cancer Neg Hx    Family Psychiatric  History:  Social History:  Social History   Substance and Sexual Activity  Alcohol Use No  . Alcohol/week: 0.0 oz     Social History   Substance and Sexual Activity  Drug Use No    Social History   Socioeconomic History  . Marital status: Single    Spouse name: Not on file  . Number of children: 0  . Years of education: Not on file  . Highest education level: Not on file  Occupational History  . 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 Financial resource strain: Not on file  . Food insecurity:    Worry: Not on file    Inability: Not on file  . Transportation needs:    Medical: Not on file    Non-medical: Not on file  Tobacco Use  . Smoking status: Former Smoker    Types: Cigarettes     Last attempt to quit: 05/08/2009    Years since quitting: 8.3  . Smokeless tobacco: Never Used  Substance and Sexual Activity  . Alcohol use: No    Alcohol/week: 0.0 oz  . Drug use: No  . Sexual activity: Not Currently  Lifestyle  . Physical activity:    Days per week: Not on file    Minutes per session: Not on file  . Stress: Not on file  Relationships  . Social connections:    Talks on phone: Not on file    Gets together: Not on file    Attends religious service: Not on file    Active member of club or organization: Not on file    Attends meetings of clubs or organizations: Not on file    Relationship status: Not on file  Other Topics Concern  . Not on file  Social History Narrative  . Not on file   Additional Social History:                         Sleep: Good  Appetite:  Good  Current Medications: Current Outpatient Medications  Medication Sig Dispense Refill  . aspirin EC 81 MG tablet Take 81  mg by mouth daily.    . carbamazepine (TEGRETOL-XR) 200 MG 12 hr tablet Take 1 tablet by mouth qam and 2 tablets by mouth qhs 90 tablet 2  . clonazePAM (KLONOPIN) 1 MG tablet 2  qhs 60 tablet 5  . diphenhydrAMINE (BENADRYL) 25 MG tablet Take 75 mg by mouth at bedtime.    Marland Kitchen lithium 300 MG tablet Take 1 tablet (300 mg total) by mouth 2 (two) times daily. 60 tablet 2  . lithium 600 MG capsule 1 qam   2  qhs 90 capsule 5  . Melatonin 10 MG SUBL Place 10 mg under the tongue at bedtime.    . nadolol (CORGARD) 40 MG tablet TAKE 1 TABLET BY MOUTH EVERY DAY 30 tablet 0  . pantoprazole (PROTONIX) 40 MG tablet Take 1 tablet (40 mg total) by mouth daily. 90 tablet 3  . traZODone (DESYREL) 50 MG tablet Take 1 tablet at bedtime - may increase to 2 po qhs if needed 60 tablet 3  . venlafaxine XR (EFFEXOR-XR) 150 MG 24 hr capsule Take 1 capsule (150 mg total) by mouth every morning. 30 capsule 8  . VIREAD 300 MG tablet Take 300 mg by mouth daily.      Current  Facility-Administered Medications  Medication Dose Route Frequency Provider Last Rate Last Dose  . carbamazepine (EQUETRO) 12 hr capsule 200 mg  200 mg Oral BID Lorenia Hoston, MD        Lab Results: No results found for this or any previous visit (from the past 48 hour(s)).  Physical Findings: AIMS:  , ,  ,  ,    CIWA:    COWS:     Musculoskeletal: Strength & Muscle Tone: within normal limits Gait & Station: normal Patient leans: N/A  Psychiatric Specialty Exam: ROS  Blood pressure 128/79, pulse 61, height 5\' 10"  (1.778 m), weight 195 lb (88.5 kg), SpO2 97 %.Body mass index is 27.98 kg/m.  General Appearance: Casual  Eye Contact:: Good   Speech:  Clear and Coherent  Volume:  Normal  Mood:  Euthymic  Affect:  NA and Appropriate  Thought Process:  Coherent  Orientation:  Full (Time, Place, and Person)  Thought Content:  WDL  Suicidal Thoughts:  No  Homicidal Thoughts:  No  Memory:  NA  Judgement:  Good  Insight:  Good  Psychomotor Activity:  Normal  Concentration:  Good  Recall:  Good  Fund of Knowledge:Good  Language: Good  Akathisia:  No  Handed:  Right  AIMS (if indicated):     Assets:  Desire for Improvement  ADL's:  Intact  Cognition: WNL  Sleep:      Treatment Plan Summary: 09/04/2017,  At this time the patient takes lithium 600 mg 1 in the morning and 2 at night he continues taking trazodone for sleep and continues Tegretol as prescribed. He no longer takes any neuroleptics. He described a little bit of a movie or sound disorder that might be related to tardive dyskinesia. Today had aims scale showed no evidence of TD. The patient continue taking his Effexor as prescribed continue all the other medicines. Patient actually is very stable. Return to see me in 3 months.  Blood pressure 128/79, pulse 61, height 5\' 10"  (1.778 m), weight 195 lb (88.5 kg), SpO2 97 %.Body mass index is 27.98 kg/m.  General Appearance: Casual  Eye Contact:: Good   Speech:  Clear  and Coherent  Volume:  Normal  Mood:  Euthymic  Affect:  NA  and Appropriate  Thought Process:  Coherent  Orientation:  Full (Time, Place, and Person)  Thought Content:  WDL  Suicidal Thoughts:  No  Homicidal Thoughts:  No  Memory:  NA  Judgement:  Good  Insight:  Good  Psychomotor Activity:  Normal  Concentration:  Good  Recall:  Good  Fund of Knowledge:Good  Language: Good  Akathisia:  No  Handed:  Right  AIMS (if indicated):     Assets:  Desire for Improvement  ADL's:  Intact  Cognition: WNL  Sleep:      Treatment Plan Summary: 09/04/2017, This patient will continue all the medication she's on but we will go ahead and increase his trazodone to taking 3 at night. He'll return to see me in about 5 weeks. By that time the patient probably to court and have a restraining order and hopefully with that he'll feel safe.

## 2017-09-07 ENCOUNTER — Ambulatory Visit: Payer: Self-pay | Admitting: *Deleted

## 2017-09-07 ENCOUNTER — Encounter: Payer: Self-pay | Admitting: Medical

## 2017-09-07 ENCOUNTER — Other Ambulatory Visit: Payer: Self-pay

## 2017-09-07 ENCOUNTER — Other Ambulatory Visit (HOSPITAL_COMMUNITY): Payer: Self-pay | Admitting: Psychiatry

## 2017-09-07 ENCOUNTER — Emergency Department (HOSPITAL_BASED_OUTPATIENT_CLINIC_OR_DEPARTMENT_OTHER)
Admission: EM | Admit: 2017-09-07 | Discharge: 2017-09-07 | Disposition: A | Discharge status: Home / Self Care | Payer: BLUE CROSS/BLUE SHIELD | Attending: Emergency Medicine | Admitting: Emergency Medicine

## 2017-09-07 ENCOUNTER — Emergency Department (HOSPITAL_BASED_OUTPATIENT_CLINIC_OR_DEPARTMENT_OTHER): Payer: BLUE CROSS/BLUE SHIELD

## 2017-09-07 ENCOUNTER — Ambulatory Visit: Payer: BLUE CROSS/BLUE SHIELD | Admitting: Medical

## 2017-09-07 ENCOUNTER — Encounter (HOSPITAL_BASED_OUTPATIENT_CLINIC_OR_DEPARTMENT_OTHER): Payer: Self-pay | Admitting: *Deleted

## 2017-09-07 VITALS — BP 123/68 | HR 63 | Temp 97.9°F | Resp 16 | Ht 69.0 in | Wt 198.0 lb

## 2017-09-07 DIAGNOSIS — F319 Bipolar disorder, unspecified: Secondary | ICD-10-CM

## 2017-09-07 DIAGNOSIS — R200 Anesthesia of skin: Secondary | ICD-10-CM | POA: Diagnosis not present

## 2017-09-07 DIAGNOSIS — Z7982 Long term (current) use of aspirin: Secondary | ICD-10-CM | POA: Insufficient documentation

## 2017-09-07 DIAGNOSIS — R51 Headache: Secondary | ICD-10-CM | POA: Insufficient documentation

## 2017-09-07 DIAGNOSIS — R42 Dizziness and giddiness: Secondary | ICD-10-CM | POA: Diagnosis not present

## 2017-09-07 DIAGNOSIS — Z87891 Personal history of nicotine dependence: Secondary | ICD-10-CM | POA: Diagnosis not present

## 2017-09-07 DIAGNOSIS — Z8661 Personal history of infections of the central nervous system: Secondary | ICD-10-CM

## 2017-09-07 DIAGNOSIS — M542 Cervicalgia: Secondary | ICD-10-CM | POA: Diagnosis not present

## 2017-09-07 DIAGNOSIS — Z79899 Other long term (current) drug therapy: Secondary | ICD-10-CM | POA: Insufficient documentation

## 2017-09-07 DIAGNOSIS — R519 Headache, unspecified: Secondary | ICD-10-CM

## 2017-09-07 LAB — COMPREHENSIVE METABOLIC PANEL
ALK PHOS: 66 U/L (ref 38–126)
ALT: 33 U/L (ref 17–63)
AST: 32 U/L (ref 15–41)
Albumin: 4 g/dL (ref 3.5–5.0)
Anion gap: 7 (ref 5–15)
BILIRUBIN TOTAL: 0.5 mg/dL (ref 0.3–1.2)
BUN: 10 mg/dL (ref 6–20)
CO2: 25 mmol/L (ref 22–32)
CREATININE: 0.76 mg/dL (ref 0.61–1.24)
Calcium: 8.7 mg/dL — ABNORMAL LOW (ref 8.9–10.3)
Chloride: 108 mmol/L (ref 101–111)
Glucose, Bld: 95 mg/dL (ref 65–99)
Potassium: 4.1 mmol/L (ref 3.5–5.1)
Sodium: 140 mmol/L (ref 135–145)
TOTAL PROTEIN: 6.4 g/dL — AB (ref 6.5–8.1)

## 2017-09-07 LAB — CBC WITH DIFFERENTIAL/PLATELET
BASOS ABS: 0 10*3/uL (ref 0.0–0.1)
Basophils Relative: 0 %
EOS ABS: 0.1 10*3/uL (ref 0.0–0.7)
EOS PCT: 2 %
HCT: 41.4 % (ref 39.0–52.0)
Hemoglobin: 14.2 g/dL (ref 13.0–17.0)
Lymphocytes Relative: 28 %
Lymphs Abs: 1.1 10*3/uL (ref 0.7–4.0)
MCH: 33.3 pg (ref 26.0–34.0)
MCHC: 34.3 g/dL (ref 30.0–36.0)
MCV: 97 fL (ref 78.0–100.0)
Monocytes Absolute: 0.5 10*3/uL (ref 0.1–1.0)
Monocytes Relative: 12 %
Neutro Abs: 2.3 10*3/uL (ref 1.7–7.7)
Neutrophils Relative %: 58 %
PLATELETS: 69 10*3/uL — AB (ref 150–400)
RBC: 4.27 MIL/uL (ref 4.22–5.81)
RDW: 14 % (ref 11.5–15.5)
WBC: 4 10*3/uL (ref 4.0–10.5)

## 2017-09-07 LAB — LIPASE, BLOOD: LIPASE: 54 U/L — AB (ref 11–51)

## 2017-09-07 MED ORDER — METOCLOPRAMIDE HCL 5 MG/ML IJ SOLN
10.0000 mg | Freq: Once | INTRAMUSCULAR | Status: AC
  Administered 2017-09-07: 10 mg via INTRAVENOUS
  Filled 2017-09-07: qty 2

## 2017-09-07 MED ORDER — DEXAMETHASONE SODIUM PHOSPHATE 10 MG/ML IJ SOLN
10.0000 mg | Freq: Once | INTRAMUSCULAR | Status: AC
  Administered 2017-09-07: 10 mg via INTRAVENOUS
  Filled 2017-09-07: qty 1

## 2017-09-07 MED ORDER — SODIUM CHLORIDE 0.9 % IV BOLUS
1000.0000 mL | Freq: Once | INTRAVENOUS | Status: AC
  Administered 2017-09-07: 1000 mL via INTRAVENOUS

## 2017-09-07 MED ORDER — SODIUM CHLORIDE 0.9 % IV SOLN: INTRAVENOUS | Status: DC

## 2017-09-07 MED ORDER — DIPHENHYDRAMINE HCL 50 MG/ML IJ SOLN
50.0000 mg | Freq: Once | INTRAMUSCULAR | Status: AC
  Administered 2017-09-07: 50 mg via INTRAVENOUS
  Filled 2017-09-07: qty 1

## 2017-09-07 NOTE — ED Notes (Signed)
Pt on auto VS  

## 2017-09-07 NOTE — ED Notes (Signed)
Pt in Radiology 

## 2017-09-07 NOTE — ED Notes (Signed)
ED Provider at bedside. 

## 2017-09-07 NOTE — Discharge Instructions (Addendum)
Return tomorrow afternoon for MRI brain with emphasis on the pituitary gland here tomorrow at your scheduled time.  Go home and rest.  But the medications received here begin to work 1 of those was Decadron which slow acting but will be in full force by morning.  Return for any new or worse symptoms.  Return for fevers altered mental status any new or worse symptoms.

## 2017-09-07 NOTE — ED Triage Notes (Signed)
Headache x 3 days. Not able to sleep due to pain.

## 2017-09-07 NOTE — ED Provider Notes (Signed)
[facility] Provider Note   CSN: [id number] Arrival date & time: 09/07/17  1409     History   Chief Complaint Chief Complaint  Patient presents with  . Headache    HPI Angel [name] is a 62 y.o. male.  Patient presents with a 4-day history of headache that started behind the eyes and then radiates to the back of the head and down the neck.  No fevers no visual changes no nausea no vomiting patient took Tylenol today with that was at 6 in the morning.  Patient was at his primary care provider and sent down here for additional work-up.  Patient does not have a history of headaches this is unusual.  Started off fairly severe and has been persistent.  No systemic symptoms.  Patient has a history of cirrhosis and followed by GI medicine at [facility].  Some dizziness associated with a headache but no room spinning.  Denies any focal neuro deficits but patient's companion felt that his balance has been off some.  Patient without any medication changes correlating to the onset of the headache.  He has had some sense but none that correlates with the onset of the headaches.     Past Medical History:  Diagnosis Date  . Bipolar disorder ([place])   . 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 Depression   . GERD (gastroesophageal reflux disease)   . Hepatitis B    Hepatitis E. antigen positive by history, status post treatment with Hepsera and baraclude   . Hepatitis B carrier ([place])   . Personal history of colonic polyps    Adenomas polyp 2008 and 2010, 2015   . Weight loss    Pt. has lost 10 lbs since pre-visit, intentionally with diet and walking    Patient Active Problem List   Diagnosis Date Noted  . Hyperglycemia 10/07/2013  . Hepatic cirrhosis ([place]) 10/03/2013  . Bipolar I disorder, most recent episode (or current) depressed, unspecified 05/21/2013  . Hepatitis B 03/21/2013  . Esophageal reflux 03/21/2013  . Personal history of colonic  polyps 03/21/2013  . Bipolar disorder, unspecified ([place]) 09/03/2011    Past Surgical History:  Procedure Laterality Date  . APPENDECTOMY    . COLONOSCOPY    . POLYPECTOMY    . UPPER GASTROINTESTINAL ENDOSCOPY          Home Medications    Prior to Admission medications   Medication Sig Start Date End Date Taking? Authorizing Provider  aspirin EC 81 MG tablet Take 81 mg by mouth daily.    [provider]  carbamazepine (TEGRETOL-XR) 200 MG 12 hr tablet Take 1 tablet by mouth qam and 2 tablets by mouth qhs Patient taking differently: 300 mg. Take 1 tablet by mouth qam and 2 tablets by mouth qhs 08/01/17   Plovsky, Berneta Sages, MD  clonazePAM Bobbye [place]) 1 MG tablet 2  qhs 08/01/17   Plovsky, Berneta Sages, MD  diphenhydrAMINE (BENADRYL) 25 MG tablet Take 75 mg by mouth at bedtime.    [provider]  lithium 300 MG tablet Take 1 tablet (300 mg total) by mouth 2 (two) times daily. 05/18/17 05/18/18  Plovsky, Berneta Sages, MD  lithium 600 MG capsule 1 qam   2  qhs 08/01/17   Plovsky, Berneta Sages, MD  Melatonin 10 MG SUBL Place 10 mg under the tongue at bedtime.    [provider]  nadolol (CORGARD) 40 MG tablet TAKE 1 TABLET BY MOUTH EVERY DAY 06/02/14  Inda Castle, MD  pantoprazole (PROTONIX) 40 MG tablet Take 1 tablet (40 mg total) by mouth daily. 09/28/16   Copland, Gay Filler, MD  traZODone (DESYREL) 50 MG tablet 3 daily at bedtime and may repeat with one more pill 09/04/17   Norma Fredrickson, MD  venlafaxine XR (EFFEXOR-XR) 150 MG 24 hr capsule Take 1 capsule (150 mg total) by mouth every morning. 08/01/17   Plovsky, Berneta Sages, MD  VIREAD 300 MG tablet Take 300 mg by mouth daily.  07/09/13   [provider]    Family History Family History  Problem Relation Age of Onset  . Heart disease Mother   . Diabetes Father   . Kidney failure Father   . Kidney disease Father   . Other Sister        GERD  . Colon cancer Neg Hx   . Rectal cancer Neg Hx   . Stomach cancer Neg Hx   .  Esophageal cancer Neg Hx     Social History Social History   Tobacco Use  . Smoking status: Former Smoker    Types: Cigarettes    Last attempt to quit: 05/08/2009    Years since quitting: 8.3  . Smokeless tobacco: Never Used  Substance Use Topics  . Alcohol use: No    Alcohol/week: 0.0 oz  . Drug use: No     Allergies   Aripiprazole; Invega [paliperidone er]; Abilify [aripiprazole]; Lamictal [lamotrigine]; and Lexapro [escitalopram oxalate]   Review of Systems Review of Systems  Constitutional: Negative for fever.  HENT: Negative for congestion and sore throat.   Eyes: Negative for photophobia and visual disturbance.  Respiratory: Negative for cough and shortness of breath.   Cardiovascular: Negative for chest pain and leg swelling.  Gastrointestinal: Negative for abdominal pain, nausea and vomiting.  Genitourinary: Negative for dysuria.  Musculoskeletal: Positive for neck pain. Negative for back pain.  Skin: Negative for rash.  Neurological: Positive for dizziness, numbness and headaches. Negative for syncope, facial asymmetry, speech difficulty and weakness.  Hematological: Does not bruise/bleed easily.  Psychiatric/Behavioral: Negative for confusion.     Physical Exam Updated Vital Signs BP (!) 144/87 (BP Location: Right Arm)   Pulse (!) 59   Temp 98.2 F (36.8 C) (Oral)   Resp 18   Ht 1.753 m (5\' 9" )   Wt 89.8 kg (198 lb)   SpO2 99%   BMI 29.24 kg/m   Physical Exam  Constitutional: He is oriented to person, place, and time. He appears well-developed and well-nourished. No distress.  HENT:  Head: Normocephalic and atraumatic.  Mouth/Throat: Oropharynx is clear and moist.  Eyes: Pupils are equal, round, and reactive to light. Conjunctivae and EOM are normal.  Neck:  Pain to bilateral lateral aspect of the neck with range of motion.  Patient without nuchal rigidity.  Actually prefers to sit with his neck flexed.  Cardiovascular: Normal rate, regular rhythm  and normal heart sounds.  Pulmonary/Chest: Effort normal and breath sounds normal. No respiratory distress.  Abdominal: Soft. Bowel sounds are normal. There is no tenderness.  Musculoskeletal: Normal range of motion. He exhibits no edema.  Neurological: He is alert and oriented to person, place, and time. No cranial nerve deficit or sensory deficit. He exhibits normal muscle tone. Coordination normal.  Skin: Skin is warm. Capillary refill takes less than 2 seconds. No rash noted.  Nursing note and vitals reviewed.    ED Treatments / Results  Labs (all labs ordered are listed, but only abnormal results  are displayed) Labs Reviewed  COMPREHENSIVE METABOLIC PANEL - Abnormal; Notable for the following components:      Result Value   Calcium 8.7 (*)    Total Protein 6.4 (*)    All other components within normal limits  LIPASE, BLOOD - Abnormal; Notable for the following components:   Lipase 54 (*)    All other components within normal limits  CBC WITH DIFFERENTIAL/PLATELET - Abnormal; Notable for the following components:   Platelets 69 (*)    All other components within normal limits    EKG None  Radiology Ct Head Wo Contrast  Result Date: 09/07/2017 CLINICAL DATA:  62 year old male with headache and neck pain for 4 days. Worst headache of life. Ataxia. EXAM: CT HEAD WITHOUT CONTRAST CT CERVICAL SPINE WITHOUT CONTRAST TECHNIQUE: Multidetector CT imaging of the head and cervical spine was performed following the standard protocol without intravenous contrast. Multiplanar CT image reconstructions of the cervical spine were also generated. COMPARISON:  None. FINDINGS: CT HEAD FINDINGS Brain: Cerebral volume is within normal limits for age. Mild dystrophic basal ganglia calcifications. No acute intracranial hemorrhage identified. No cortically based acute infarct identified. No ventriculomegaly. Partially empty sella configuration also with a rounded area of CSF density mass effect on the  inferior 3rd ventricle (series 2, image 12 and series 4, image 31) which likely also affects the optic chiasm. No abnormal soft tissue density in this region. No other extra-axial fluid collection identified. No other intracranial mass or mass effect. Vascular: Mild Calcified atherosclerosis at the skull base. Skull: Negative. Sinuses/Orbits: Visible paranasal sinuses, tympanic cavities and mastoids are clear. Other: Visualized orbits and scalp soft tissues are within normal limits. CT CERVICAL SPINE FINDINGS Alignment: Straightening and mild reversal of cervical lordosis. Cervicothoracic junction alignment is within normal limits. Bilateral posterior element alignment is within normal limits. Skull base and vertebrae: Visualized skull base is intact. No atlanto-occipital dissociation. Ankylosis of the right C2-C3 posterior elements, and developing ankylosis at the left posterior elements and the left posterior interbody space. Possible developing interbody ankylosis at C5-C6. No acute osseous abnormality identified. Soft tissues and spinal canal: No prevertebral fluid or swelling. No visible canal hematoma. Negative noncontrast neck soft tissues. Disc levels: Advanced degenerative osteophytosis at the anterior C1-odontoid articulation in the setting of C2-C3 ankylosis. Associated severe right side facet arthropathy at C3-C4 and C4-C5. Severe disc space loss at C5-C6 with possible developing interbody ankylosis. Mild to moderate lower cervical facet hypertrophy. Disc osteophyte complex at C5-C6 may result in mild spinal stenosis. Upper chest: Visible upper thoracic levels appear intact. Negative lung apices. Negative noncontrast thoracic inlet. IMPRESSION: 1. Suprasellar cistern region mass effect apparently from extra-axial CSF, such as an arachnoid cyst. Recommend Brain MRI with Pituitary Protocol (without and with contrast) to confirm, but if this is a simple arachnoid cyst then acute clinical significance would  be doubtful. 2. Otherwise unremarkable for age noncontrast CT appearance of the brain. 3.  No acute osseous abnormality in the cervical spine. 4. Multilevel advanced cervical spine degeneration in the setting of some ankylosis of cervical levels. Possible mild degenerative spinal stenosis at C5-C6. Electronically Signed   By: Genevie Ann M.D.   On: 09/07/2017 16:50   Ct Cervical Spine Wo Contrast  Result Date: 09/07/2017 CLINICAL DATA:  62 year old male with headache and neck pain for 4 days. Worst headache of life. Ataxia. EXAM: CT HEAD WITHOUT CONTRAST CT CERVICAL SPINE WITHOUT CONTRAST TECHNIQUE: Multidetector CT imaging of the head and cervical spine was performed following  the standard protocol without intravenous contrast. Multiplanar CT image reconstructions of the cervical spine were also generated. COMPARISON:  None. FINDINGS: CT HEAD FINDINGS Brain: Cerebral volume is within normal limits for age. Mild dystrophic basal ganglia calcifications. No acute intracranial hemorrhage identified. No cortically based acute infarct identified. No ventriculomegaly. Partially empty sella configuration also with a rounded area of CSF density mass effect on the inferior 3rd ventricle (series 2, image 12 and series 4, image 31) which likely also affects the optic chiasm. No abnormal soft tissue density in this region. No other extra-axial fluid collection identified. No other intracranial mass or mass effect. Vascular: Mild Calcified atherosclerosis at the skull base. Skull: Negative. Sinuses/Orbits: Visible paranasal sinuses, tympanic cavities and mastoids are clear. Other: Visualized orbits and scalp soft tissues are within normal limits. CT CERVICAL SPINE FINDINGS Alignment: Straightening and mild reversal of cervical lordosis. Cervicothoracic junction alignment is within normal limits. Bilateral posterior element alignment is within normal limits. Skull base and vertebrae: Visualized skull base is intact. No  atlanto-occipital dissociation. Ankylosis of the right C2-C3 posterior elements, and developing ankylosis at the left posterior elements and the left posterior interbody space. Possible developing interbody ankylosis at C5-C6. No acute osseous abnormality identified. Soft tissues and spinal canal: No prevertebral fluid or swelling. No visible canal hematoma. Negative noncontrast neck soft tissues. Disc levels: Advanced degenerative osteophytosis at the anterior C1-odontoid articulation in the setting of C2-C3 ankylosis. Associated severe right side facet arthropathy at C3-C4 and C4-C5. Severe disc space loss at C5-C6 with possible developing interbody ankylosis. Mild to moderate lower cervical facet hypertrophy. Disc osteophyte complex at C5-C6 may result in mild spinal stenosis. Upper chest: Visible upper thoracic levels appear intact. Negative lung apices. Negative noncontrast thoracic inlet. IMPRESSION: 1. Suprasellar cistern region mass effect apparently from extra-axial CSF, such as an arachnoid cyst. Recommend Brain MRI with Pituitary Protocol (without and with contrast) to confirm, but if this is a simple arachnoid cyst then acute clinical significance would be doubtful. 2. Otherwise unremarkable for age noncontrast CT appearance of the brain. 3.  No acute osseous abnormality in the cervical spine. 4. Multilevel advanced cervical spine degeneration in the setting of some ankylosis of cervical levels. Possible mild degenerative spinal stenosis at C5-C6. Electronically Signed   By: Genevie Ann M.D.   On: 09/07/2017 16:50    Procedures Procedures (including critical care time)  Medications Ordered in ED Medications  0.9 %  sodium chloride infusion (has no administration in time range)  sodium chloride 0.9 % bolus 1,000 mL (0 mLs Intravenous Stopped 09/07/17 1648)  metoCLOPramide (REGLAN) injection 10 mg (10 mg Intravenous Given 09/07/17 1554)  diphenhydrAMINE (BENADRYL) injection 50 mg (50 mg Intravenous  Given 09/07/17 1554)  dexamethasone (DECADRON) injection 10 mg (10 mg Intravenous Given 09/07/17 1816)     Initial Impression / Assessment and Plan / ED Course  I have reviewed the triage vital signs and the nursing notes.  Pertinent labs & imaging results that were available during my care of the patient were reviewed by me and considered in my medical decision making (see chart for details).     Patient work-up here included CT head and lab work and was treated with a migraine cocktail.  Never felt headache was related to a migraine.  It is possible it could be a meningitis type picture.  There is no altered mental status headache has been essentially unchanged for 4 days.  Making a bacterial meningitis highly unlikely.  Patient preferred not to  have lumbar puncture.  Clinically I think if it is any kind of meningitis it would be viral.  CT however showed some concern around the pituitary gland which radiology recommended MRI they felt getting an MRI here tomorrow as an outpatient was appropriate.  This has been scheduled and patient will come back tomorrow afternoon.  Patient's headache with improvement with Reglan and Benadryl and then later had Decadron added after the head CT results were known.  Not completely  completely resolved but has improved.   Patient without a leukocytosis.  Patient has a long-standing thrombocytopenia.  But platelet counts are greater than 40,000.  No fever here at all last time he had Tylenol was 6 in the morning and also no history of fever.  White blood cell count was actually little on the low side more suggestive of viral.  Patient in the past many many years ago had encephalopathy.  Again no altered mental status here today.  On neuro exam no real focal findings coordination seems to be fine there was mention at home but his balance seem to be off.  Patient is okay with going home he will rest and I come back for the MRI anything new or worse happens over return  earlier.  Patient has some mild discomfort with range of motion of the neck is mostly laterally.  No nuchal rigidity.  CT neck also showed multiple degenerative changes at several levels.  But patient really has not had a lot of trouble with his neck in the past.  Final Clinical Impressions(s) / ED Diagnoses   Final diagnoses:  Acute intractable headache, unspecified headache type    ED Discharge Orders    None       Fredia Sorrow, MD 09/07/17 2003

## 2017-09-07 NOTE — Patient Instructions (Addendum)
With your recent 3-day onset, neck stiffness, dizziness, history of probable meningitis years ago, and poor heel toe gait, I do think it would be best to be evaluated in the emergency department.  You would likely benefit from static CT imaging and they can quickly assess response your headaches to medications.  They could do further evaluation as well to evaluate meningitis if they thought this was indicated.  There is a slight chance that she might need to be evaluated at the main ED/hospital if it is determined that MRI is needed.  Follow-up with [place] as determined by ED.

## 2017-09-07 NOTE — ED Notes (Signed)
Pt reports taking 1000mg  tylenol this AM.

## 2017-09-07 NOTE — ED Notes (Signed)
Patient transported to CT 

## 2017-09-07 NOTE — Progress Notes (Signed)
Subjective:    Patient ID: Angel [name], male    DOB: 28-Jun-1955, 62 y.o.   MRN: [id number]  HPI  Pt in reporting some HA for 3 days. Pt states ha behind eye initially. Now some ha on back of his head. Some neck tightness. No hx of chronic ha. No migraines.   When in high school states illness covering of his brain was inflamed.Marland Kitchen He was admitted to hospital back then. Seems to describe meningitis.  Also difficulty sleeping. He takes trazadone and is on carbamepazine.  Pt has hx of bipolar. Friend who is on phone No nausea, no vomiting. No light sensitivity. No fever.  Recently he has had a lot of stress.  Pt level of pain is about 7/10.  Pt has tried tylenol and benadryl for ha. Did not help.  He does agree that this is worse HA he has had.   Review of Systems  Constitutional: Negative for chills, fatigue and fever.  Respiratory: Negative for cough, chest tightness and wheezing.   Cardiovascular: Negative for chest pain and palpitations.  Gastrointestinal: Negative for abdominal pain.  Musculoskeletal: Positive for neck pain and neck stiffness. Negative for back pain.  Skin: Negative for rash.  Neurological: Positive for dizziness and headaches. Negative for seizures, syncope, weakness and light-headedness.  Hematological: Negative for adenopathy. Does not bruise/bleed easily.  Psychiatric/Behavioral: Negative for agitation, behavioral problems and decreased concentration. The patient is not nervous/anxious.     Past Medical History:  Diagnosis Date  . Bipolar disorder ([place])   . 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 Depression   . GERD (gastroesophageal reflux disease)   . Hepatitis B    Hepatitis E. antigen positive by history, status post treatment with Hepsera and baraclude   . Hepatitis B carrier ([place])   . Personal history of colonic polyps    Adenomas polyp 2008 and 2010, 2015   . Weight loss    Pt. has lost 10 lbs since pre-visit, intentionally  with diet and walking     Social History   Socioeconomic History  . Marital status: Single    Spouse name: Not on file  . Number of children: 0  . Years of education: Not on file  . Highest education level: Not on file  Occupational History  . 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 Financial resource strain: Not on file  . Food insecurity:    Worry: Not on file    Inability: Not on file  . Transportation needs:    Medical: Not on file    Non-medical: Not on file  Tobacco Use  . Smoking status: Former Smoker    Types: Cigarettes    Last attempt to quit: 05/08/2009    Years since quitting: 8.3  . Smokeless tobacco: Never Used  Substance and Sexual Activity  . Alcohol use: No    Alcohol/week: 0.0 oz  . Drug use: No  . Sexual activity: Not Currently  Lifestyle  . Physical activity:    Days per week: Not on file    Minutes per session: Not on file  . Stress: Not on file  Relationships  . Social connections:    Talks on phone: Not on file    Gets together: Not on file    Attends religious service: Not on file    Active member of club or organization: Not on file    Attends meetings of clubs or organizations: Not on  file    Relationship status: Not on file  . Intimate partner violence:    Fear of current or ex partner: Not on file    Emotionally abused: Not on file    Physically abused: Not on file    Forced sexual activity: Not on file  Other Topics Concern  . Not on file  Social History Narrative  . Not on file    Past Surgical History:  Procedure Laterality Date  . APPENDECTOMY    . COLONOSCOPY    . POLYPECTOMY    . UPPER GASTROINTESTINAL ENDOSCOPY      Family History  Problem Relation Age of Onset  . Heart disease Mother   . Diabetes Father   . Kidney failure Father   . Kidney disease Father   . Other Sister        GERD  . Colon cancer Neg Hx   . Rectal cancer Neg Hx   . Stomach cancer Neg Hx   . Esophageal cancer Neg Hx       Allergies  Allergen Reactions  . Aripiprazole Other (See Comments)    Tardive Dyskinesia  . Invega [Paliperidone Er] Other (See Comments)    Tardive dyskinsia symptoms  . Abilify [Aripiprazole]   . Lamictal [Lamotrigine] Rash  . Lexapro [Escitalopram Oxalate] Other (See Comments)    Doesn't Work    Current Outpatient Medications on File Prior to Visit  Medication Sig Dispense Refill  . aspirin EC 81 MG tablet Take 81 mg by mouth daily.    . carbamazepine (TEGRETOL-XR) 200 MG 12 hr tablet Take 1 tablet by mouth qam and 2 tablets by mouth qhs (Patient taking differently: 300 mg. Take 1 tablet by mouth qam and 2 tablets by mouth qhs) 90 tablet 2  . clonazePAM (KLONOPIN) 1 MG tablet 2  qhs 60 tablet 5  . diphenhydrAMINE (BENADRYL) 25 MG tablet Take 75 mg by mouth at bedtime.    Marland Kitchen lithium 300 MG tablet Take 1 tablet (300 mg total) by mouth 2 (two) times daily. 60 tablet 2  . lithium 600 MG capsule 1 qam   2  qhs 90 capsule 5  . Melatonin 10 MG SUBL Place 10 mg under the tongue at bedtime.    . nadolol (CORGARD) 40 MG tablet TAKE 1 TABLET BY MOUTH EVERY DAY 30 tablet 0  . pantoprazole (PROTONIX) 40 MG tablet Take 1 tablet (40 mg total) by mouth daily. 90 tablet 3  . traZODone (DESYREL) 50 MG tablet 3 daily at bedtime and may repeat with one more pill 100 tablet 3  . venlafaxine XR (EFFEXOR-XR) 150 MG 24 hr capsule Take 1 capsule (150 mg total) by mouth every morning. 30 capsule 8  . VIREAD 300 MG tablet Take 300 mg by mouth daily.      Current Facility-Administered Medications on File Prior to Visit  Medication Dose Route Frequency Provider Last Rate Last Dose  . carbamazepine (EQUETRO) 12 hr capsule 200 mg  200 mg Oral BID Plovsky, Gerald, MD        BP 123/68   Pulse 63   Temp 97.9 F (36.6 C) (Oral)   Resp 16   Ht 5\' 9"  (1.753 m)   Wt 198 lb (89.8 kg)   SpO2 99%   BMI 29.24 kg/m       Objective:   Physical Exam  General Mental Status- Alert. General Appearance- Not  in acute distress.   Skin General: Color- Normal Color. Moisture- Normal Moisture.  Neck Carotid Arteries- Normal color. Moisture- Normal Moisture. No carotid bruits. No JVD. Neck stiffness .Appears to be in a moderate to severe pain on range of motion.   Chest and Lung Exam Auscultation: Breath Sounds:-Normal.  Cardiovascular Auscultation:Rythm- Regular. Murmurs & Other Heart Sounds:Auscultation of the heart reveals- No Murmurs.  Abdomen Inspection:-Inspeection Normal. Palpation/Percussion:Note:No mass. Palpation and Percussion of the abdomen reveal- Non Tender, Non Distended + BS, no rebound or guarding.    Neurologic Cranial Nerve exam:- CN III-XII intact(No nystagmus), symmetric smile. Drift Test:- No drift. Romberg Exam:- Negative.  Heal to Toe Gait exam:-poor heel to toe gait. Finger to Nose:- Normal/Intact Strength:- 4/5 equal and symmetric strength both upper and lower extremities.(intially grip strength 1/5)      Assessment & Plan:  With your recent 3-day onset, neck stiffness, dizziness, history of probable meningitis years ago, and poor heel toe gait, I do think it would be best to be evaluated in the emergency department.  You would likely benefit from static CT imaging and they can quickly assess response your headaches to medications.  They could do further evaluation as well to evaluate meningitis if they thought this was indicated.  There is a slight chance that she might need to be evaluated at the main ED/hospital if it is determined that MRI is needed.  Follow-up with [place] as determined by ED.  [name], PA-C

## 2017-09-07 NOTE — Telephone Encounter (Signed)
No  Availability with Dr Lorelei Pont today.Appointment made with [name] for today . Reason for Disposition . [1] MODERATE headache (e.g., interferes with normal activities) AND [2] present > 24 hours AND [3] unexplained  (Exceptions: analgesics not tried, typical migraine, or headache part of viral illness)  Answer Assessment - Initial Assessment Questions 1. LOCATION: "Where does it hurt?"        At first it was  In front  - now it is back of head   2. ONSET: "When did the headache start?" (Minutes, hours or days)         3  Days ago  3. PATTERN: "Does the pain come and go, or has it been constant since it started?"           Constant   4. SEVERITY: "How bad is the pain?" and "What does it keep you from doing?"  (e.g., Scale 1-10; mild, moderate, or severe)   - MILD (1-3): doesn't interfere with normal activities    - MODERATE (4-7): interferes with normal activities or awakens from sleep    - SEVERE (8-10): excruciating pain, unable to do any normal activities           7  5. RECURRENT SYMPTOM: "Have you ever had headaches before?" If so, ask: "When was the last time?" and "What happened that time?"       NO  6. CAUSE: "What do you think is causing the headache?"       Stress   7. MIGRAINE: "Have you been diagnosed with migraine headaches?" If so, ask: "Is this headache similar?"        No  8. HEAD INJURY: "Has there been any recent injury to the head?"         No 9. OTHER SYMPTOMS: "Do you have any other symptoms?" (fever, stiff neck, eye pain, sore throat, cold symptoms)      Neck feels tense and stiff  10. PREGNANCY: "Is there any chance you are pregnant?" "When was your last menstrual period?"        n/a  Protocols used: HEADACHE-A-AH

## 2017-09-08 ENCOUNTER — Other Ambulatory Visit (HOSPITAL_BASED_OUTPATIENT_CLINIC_OR_DEPARTMENT_OTHER): Payer: Self-pay | Admitting: Emergency Medicine

## 2017-09-08 ENCOUNTER — Inpatient Hospital Stay (HOSPITAL_BASED_OUTPATIENT_CLINIC_OR_DEPARTMENT_OTHER): Admit: 2017-09-08 | Payer: BLUE CROSS/BLUE SHIELD

## 2017-09-08 ENCOUNTER — Encounter: Payer: Self-pay | Admitting: Family Medicine

## 2017-09-08 ENCOUNTER — Ambulatory Visit (HOSPITAL_BASED_OUTPATIENT_CLINIC_OR_DEPARTMENT_OTHER)
Admission: RE | Admit: 2017-09-08 | Discharge: 2017-09-08 | Disposition: A | Discharge status: Home / Self Care | Payer: BLUE CROSS/BLUE SHIELD | Source: Ambulatory Visit | Attending: Emergency Medicine | Admitting: Emergency Medicine

## 2017-09-08 DIAGNOSIS — G930 Cerebral cysts: Secondary | ICD-10-CM | POA: Insufficient documentation

## 2017-09-08 DIAGNOSIS — R51 Headache: Principal | ICD-10-CM

## 2017-09-08 DIAGNOSIS — R519 Headache, unspecified: Secondary | ICD-10-CM

## 2017-09-08 DIAGNOSIS — G44221 Chronic tension-type headache, intractable: Secondary | ICD-10-CM | POA: Diagnosis not present

## 2017-09-08 MED ORDER — GADOBENATE DIMEGLUMINE 529 MG/ML IV SOLN
15.0000 mL | Freq: Once | INTRAVENOUS | Status: AC | PRN
  Administered 2017-09-08: 15 mL via INTRAVENOUS

## 2017-09-10 ENCOUNTER — Encounter: Payer: Self-pay | Admitting: Medical

## 2017-09-10 ENCOUNTER — Ambulatory Visit: Payer: BLUE CROSS/BLUE SHIELD | Admitting: Medical

## 2017-09-10 VITALS — BP 117/70 | HR 59 | Temp 98.2°F | Resp 16 | Ht 69.0 in | Wt 199.0 lb

## 2017-09-10 DIAGNOSIS — F325 Major depressive disorder, single episode, in full remission: Secondary | ICD-10-CM

## 2017-09-10 DIAGNOSIS — Z8619 Personal history of other infectious and parasitic diseases: Secondary | ICD-10-CM

## 2017-09-10 DIAGNOSIS — D696 Thrombocytopenia, unspecified: Secondary | ICD-10-CM

## 2017-09-10 DIAGNOSIS — M542 Cervicalgia: Secondary | ICD-10-CM | POA: Diagnosis not present

## 2017-09-10 DIAGNOSIS — R51 Headache: Secondary | ICD-10-CM | POA: Diagnosis not present

## 2017-09-10 DIAGNOSIS — G930 Cerebral cysts: Secondary | ICD-10-CM

## 2017-09-10 DIAGNOSIS — R519 Headache, unspecified: Secondary | ICD-10-CM

## 2017-09-10 MED ORDER — KETOROLAC TROMETHAMINE 60 MG/2ML IM SOLN
60.0000 mg | Freq: Once | INTRAMUSCULAR | Status: AC
  Administered 2017-09-10: 60 mg via INTRAMUSCULAR

## 2017-09-10 MED ORDER — CYCLOBENZAPRINE HCL 10 MG PO TABS: 10.0000 mg | ORAL_TABLET | Freq: Every day | ORAL | 0 refills | Status: DC

## 2017-09-10 MED ORDER — PREDNISONE 10 MG PO TABS: ORAL_TABLET | ORAL | 0 refills | Status: DC

## 2017-09-10 NOTE — Telephone Encounter (Signed)
Reviewed his MRI- it was normal as below:  IMPRESSION: 1. 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 This is felt to be incidental in nature, and of doubtful clinical significance. 2. Otherwise normal brain MRI.  No acute intracranial abnormality  Brought urgently to my attention by nursing staff as pt had apparently called the [facility] 3x this am.  Called him to discuss, no answer. LMOM that his MRI was essentially normal, ok here.  I can work him in this afternoon or Wednesday if needed

## 2017-09-10 NOTE — Patient Instructions (Signed)
For your recent new headache, we gave you Toradol injection today and I prescribed you Flexeril muscle relaxant.  You can start Flexeril tonight.  I did talk with our pharmacist downstairs and meloxicam and similar agents not recommended as there is interaction between lithium and viread.  So I think it is best to use 4-day prescription of low-dose prednisone.  You could start that tomorrow morning.  I went ahead and put in the referral to the neurologist.  Hopefully they will be able to see you soon so we can determine the clinical significance of the arachnoid cyst.  Hopefully when you start using your new lenses your headaches might subside some.  Please give [place] an update on that.  If headaches worsen with worsening signs and symptoms as discussed then recommend ED evaluation again.   We will get CBC, CMP and lithium level today.  Follow-up in 7 days or as needed.

## 2017-09-10 NOTE — Telephone Encounter (Signed)
-----   Message from Alphia Moh, RN sent at 09/10/2017  9:51 AM EDT -----    Patient Email Open    09/08/2017 [facility], Gay Filler, MD    Family Medicine    Conversation: Visit Follow-Up Question  (Newest Message First)   [name]  to Darreld Mclean, MD        09/08/17 8:11 AM  I had to come in for an emergency visit and you were all booked up last Friday. A wonderful nurse called Nicole Kindred said I needed to be seen in 24 hours or less so he found a Dr. Percell Miller Syger and Fredia Sorrow to treat me. I had bad headaches for 4 days with stiff necks with pain in the 7 range. They gave me interveniously a viral if Benadryl, Reglan and Decadron. I go for a Brain Contrast today for an MRI. The CT scan they did already showed a growth on the pituitary gland and they want to check it out today. They also did a round of blood work. They wanted me to see you ASAP and wondered if you can get me in sometime on Monday?  Thanks,  [place]    This encounter is not signed. The conversation may still be ongoing.   Additional Documentation   Encounter Info:  Billing Info,   History,   Allergies,   Detailed Report      Orders Placed   None    Medication Renewals and Changes     None    Medication List   Visit Diagnoses     None    Problem List

## 2017-09-10 NOTE — Telephone Encounter (Signed)
Patient has reached out to [facility] two additional  times today regarding concern over MRI results and continued headache pain. Author told [facility] nurses that Dr. Lorelei Pont is aware, and to tell patient that MRI results appear insignificant, but Dr. Lorelei Pont will follow up with him today when she is ready and able to discuss results in detail and address any concerns. If headache pain is severe, author instructed [facility] nurse to tell pt. to seek emergency care. Dr. Lorelei Pont remains aware.

## 2017-09-10 NOTE — Progress Notes (Signed)
Subjective:    Patient ID: Angel [name], male    DOB: 05-21-1955, 62 y.o.   MRN: [id number]  HPI  Pt in states his headache did decrease after he was given decadron, benadryl, and reglan.  By end of 7 hours ha did decrease to level 1-2/10. Before left ED.  Pt had some degenerative disk on xray.   No fever,no chills, and no vomiting.  Pt had ct and mri done. The mri showed arachnoid cyst.  No elevated wbc. Platelets mild low.   1. 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 This is felt to be incidental in nature, and of doubtful clinical significance. 2. Otherwise normal brain MRI.  No acute intracranial abnormality.  Pt in states some problems with glasses. New lenses are pending. He is straining to see.  Current moderate ha with trapezius tightness but no gross motor or sensory function deficits. Does have poor balance. This was the case other day in ED and on visit with me. No new neuro signs or symptoms.  Review of Systems  Constitutional: Negative for chills, fatigue and fever.  HENT: Negative for congestion.   Respiratory: Negative for cough, choking, chest tightness, wheezing and stridor.   Cardiovascular: Negative for chest pain and palpitations.  Gastrointestinal: Negative for abdominal pain.  Musculoskeletal: Positive for neck pain and neck stiffness. Negative for arthralgias and back pain.       But not rigid.  Skin: Negative for rash.  Neurological: Positive for headaches. Negative for dizziness, seizures, weakness and numbness.  Hematological: Negative for adenopathy. Does not bruise/bleed easily.  Psychiatric/Behavioral: Negative for agitation, confusion and self-injury.       Objective:   Physical Exam   General Mental Status- Alert. General Appearance- Not in acute distress.   Skin General: Color- Normal Color. Moisture- Normal Moisture.  Neck Carotid Arteries- Normal color. Moisture- Normal Moisture. No  carotid bruits. No JVD. Pain on range of motion. Pain on palpation over trapezius.  Chest and Lung Exam Auscultation: Breath Sounds:-Normal.  Cardiovascular Auscultation:Rythm- Regular. Murmurs & Other Heart Sounds:Auscultation of the heart reveals- No Murmurs.  Abdomen Inspection:-Inspeection Normal. Palpation/Percussion:Note:No mass. Palpation and Percussion of the abdomen reveal- Non Tender, Non Distended + BS, no rebound or guarding.    Neurologic Cranial Nerve exam:- CN III-XII intact(No nystagmus), symmetric smile. Drift Test:- No drift. Romberg Exam:- Negative.  Heal to Toe Gait exam:-Poor heal to toe gait. Off. Same as the other day when I saw and in ED. Finger to Nose:- Normal/Intact Strength:- 5/5 equal and symmetric strength both upper and lower extremities.     Assessment & Plan:  For your recent new headache, we gave you Toradol injection today and I prescribed you Flexeril muscle relaxant.  You can start Flexeril tonight.  I did talk with our pharmacist downstairs and meloxicam and similar agents not recommended as there is interaction between lithium and viread(particularly explained not recommended on long term daily basis).  So I think it is best to use 4-day prescription of low-dose prednisone.  You could start that tomorrow morning.  I went ahead and put in the referral to the neurologist.  Hopefully they will be able to see you soon so we can determine the clinical significance of the arachnoid cyst.  Hopefully when you start using your new lenses your headaches might subside some.  Please give [place] an update on that.  If headaches worsen with worsening signs and symptoms as discussed then recommend ED  evaluation again.   We will get CBC, CMP and lithium level today.  Follow-up in 7 days or as needed.  We gave toradol then I saw warning in epic about meloxicam and other in same class. Plan to follow closely.  [name], PA-C

## 2017-09-10 NOTE — Telephone Encounter (Signed)
Copied from [place] [phone]. Topic: Quick Communication - See Telephone Encounter >> Sep 10, 2017 11:00 AM Oliver Pila B wrote: CRM for notification. See My Chart Patient email for: 09/10/17. If Angel [name] calls (on pt Dpr) let him know a message was sent to pt and nothing is alarming on pt's ct scan, Dr. Lorelei Pont is aware and will contact pt, pcp is also aware of pain the pt is having; nurse was told and relayed the message >> Sep 10, 2017 11:21 AM Scherrie Gerlach wrote: Angel [name] called back. Wants to know if pt's pain gets so bad, should pt go back to the ED? I do not see Angel [name] is on the DPR, so did not give any information, pt is not there. Advised eric to have pt call back.

## 2017-09-11 LAB — CBC WITH DIFFERENTIAL/PLATELET
Basophils Absolute: 0 10*3/uL (ref 0.0–0.1)
Basophils Relative: 0.6 % (ref 0.0–3.0)
EOS PCT: 2 % (ref 0.0–5.0)
Eosinophils Absolute: 0.1 10*3/uL (ref 0.0–0.7)
HEMATOCRIT: 43.8 % (ref 39.0–52.0)
HEMOGLOBIN: 14.8 g/dL (ref 13.0–17.0)
LYMPHS PCT: 20.8 % (ref 12.0–46.0)
Lymphs Abs: 1.1 10*3/uL (ref 0.7–4.0)
MCHC: 33.9 g/dL (ref 30.0–36.0)
MCV: 97.9 fl (ref 78.0–100.0)
MONO ABS: 0.6 10*3/uL (ref 0.1–1.0)
MONOS PCT: 11.4 % (ref 3.0–12.0)
Neutro Abs: 3.5 10*3/uL (ref 1.4–7.7)
Neutrophils Relative %: 65.2 % (ref 43.0–77.0)
Platelets: 89 10*3/uL — ABNORMAL LOW (ref 150.0–400.0)
RBC: 4.47 Mil/uL (ref 4.22–5.81)
RDW: 14.7 % (ref 11.5–15.5)
WBC: 5.3 10*3/uL (ref 4.0–10.5)

## 2017-09-11 LAB — COMPREHENSIVE METABOLIC PANEL
ALBUMIN: 4.1 g/dL (ref 3.5–5.2)
ALK PHOS: 82 U/L (ref 39–117)
ALT: 48 U/L (ref 0–53)
AST: 39 U/L — ABNORMAL HIGH (ref 0–37)
BUN: 10 mg/dL (ref 6–23)
CO2: 30 mEq/L (ref 19–32)
Calcium: 9.4 mg/dL (ref 8.4–10.5)
Chloride: 107 mEq/L (ref 96–112)
Creatinine, Ser: 0.92 mg/dL (ref 0.40–1.50)
GFR: 88.45 mL/min (ref >60.00)
GLUCOSE: 95 mg/dL (ref 70–99)
POTASSIUM: 4.8 meq/L (ref 3.5–5.1)
Sodium: 144 mEq/L (ref 135–145)
Total Bilirubin: 0.4 mg/dL (ref 0.2–1.2)
Total Protein: 6.7 g/dL (ref 6.0–8.3)

## 2017-09-11 LAB — LITHIUM LEVEL: LITHIUM LVL: 0.3 mmol/L — AB (ref 0.6–1.2)

## 2017-09-12 ENCOUNTER — Telehealth: Payer: Self-pay | Admitting: Medical

## 2017-09-12 ENCOUNTER — Encounter: Payer: Self-pay | Admitting: Medical

## 2017-09-12 ENCOUNTER — Encounter: Payer: Self-pay | Admitting: Neurology

## 2017-09-12 ENCOUNTER — Telehealth: Payer: Self-pay

## 2017-09-12 ENCOUNTER — Ambulatory Visit: Payer: BLUE CROSS/BLUE SHIELD | Admitting: Medical

## 2017-09-12 VITALS — BP 113/71 | HR 73 | Resp 16 | Ht 69.0 in | Wt 199.8 lb

## 2017-09-12 DIAGNOSIS — M542 Cervicalgia: Secondary | ICD-10-CM

## 2017-09-12 DIAGNOSIS — F32A Depression, unspecified: Secondary | ICD-10-CM

## 2017-09-12 DIAGNOSIS — R51 Headache: Secondary | ICD-10-CM

## 2017-09-12 DIAGNOSIS — F329 Major depressive disorder, single episode, unspecified: Secondary | ICD-10-CM | POA: Diagnosis not present

## 2017-09-12 DIAGNOSIS — R748 Abnormal levels of other serum enzymes: Secondary | ICD-10-CM | POA: Diagnosis not present

## 2017-09-12 DIAGNOSIS — R519 Headache, unspecified: Secondary | ICD-10-CM

## 2017-09-12 MED ORDER — TIZANIDINE HCL 6 MG PO CAPS: ORAL_CAPSULE | ORAL | 0 refills | Status: DC

## 2017-09-12 MED ORDER — METHYLPREDNISOLONE ACETATE 40 MG/ML IJ SUSP
40.0000 mg | Freq: Once | INTRAMUSCULAR | Status: AC
  Administered 2017-09-12: 20 mg via INTRAMUSCULAR

## 2017-09-12 NOTE — Telephone Encounter (Signed)
4-5 my chart message sent to me today. Quite lengthy. Did not answer those as he had office visit. Today. Marked those as completed as addressed concerns we discussed on office visit.

## 2017-09-12 NOTE — Patient Instructions (Addendum)
For your ha today, I decided to give you depomedrol 20 mg im.(this is half dose typically give but want to avoid any side). As you report severe insomnia.  Will give zanaflex in place of flexeril.  Please get cmp, cbc and repeat lithium level.  If ha worsens/severe then ED eval at discussed.   Might consider fiorinal as back up.(Discussed with pharmacist. He recommended against fiorcet) Fiorinal would be less riskly.   Follow up in one week or as needed. Please update me tomorrow on ha level.
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
## 2017-09-12 NOTE — Telephone Encounter (Signed)
Copied from [place] [id number]. Topic: Appointment Scheduling - Scheduling Inquiry for Clinic >> Sep 12, 2017 11:36 AM Angel [name] wrote: Reason for CRM: Patient is requesting to transfer his care from Dr Lorelei Pont to Percell Miller. He says he does not want to see Dr Lorelei Pont anymore. Please advise

## 2017-09-12 NOTE — Telephone Encounter (Signed)
Please advise 

## 2017-09-12 NOTE — Telephone Encounter (Signed)
Fine with me

## 2017-09-13 ENCOUNTER — Encounter: Payer: Self-pay | Admitting: Medical

## 2017-09-13 LAB — COMPREHENSIVE METABOLIC PANEL
ALT: 44 U/L (ref 0–53)
AST: 34 U/L (ref 0–37)
Albumin: 4.2 g/dL (ref 3.5–5.2)
Alkaline Phosphatase: 75 U/L (ref 39–117)
BUN: 12 mg/dL (ref 6–23)
CO2: 32 meq/L (ref 19–32)
Calcium: 9.2 mg/dL (ref 8.4–10.5)
Chloride: 105 mEq/L (ref 96–112)
Creatinine, Ser: 0.88 mg/dL (ref 0.40–1.50)
GFR: 93.1 mL/min (ref >60.00)
GLUCOSE: 97 mg/dL (ref 70–99)
Potassium: 4.5 mEq/L (ref 3.5–5.1)
SODIUM: 144 meq/L (ref 135–145)
TOTAL PROTEIN: 6.9 g/dL (ref 6.0–8.3)
Total Bilirubin: 0.7 mg/dL (ref 0.2–1.2)

## 2017-09-13 LAB — CBC WITH DIFFERENTIAL/PLATELET
Basophils Absolute: 0 10*3/uL (ref 0.0–0.1)
Basophils Relative: 0.6 % (ref 0.0–3.0)
Eosinophils Absolute: 0.1 10*3/uL (ref 0.0–0.7)
Eosinophils Relative: 1.7 % (ref 0.0–5.0)
HCT: 45.2 % (ref 39.0–52.0)
Hemoglobin: 15.5 g/dL (ref 13.0–17.0)
LYMPHS ABS: 1.8 10*3/uL (ref 0.7–4.0)
Lymphocytes Relative: 22.9 % (ref 12.0–46.0)
MCHC: 34.4 g/dL (ref 30.0–36.0)
MCV: 96.8 fl (ref 78.0–100.0)
MONO ABS: 0.8 10*3/uL (ref 0.1–1.0)
MONOS PCT: 10.4 % (ref 3.0–12.0)
NEUTROS ABS: 5 10*3/uL (ref 1.4–7.7)
NEUTROS PCT: 64.4 % (ref 43.0–77.0)
PLATELETS: 101 10*3/uL — AB (ref 150.0–400.0)
RBC: 4.67 Mil/uL (ref 4.22–5.81)
RDW: 14.7 % (ref 11.5–15.5)
WBC: 7.7 10*3/uL (ref 4.0–10.5)

## 2017-09-13 LAB — LITHIUM LEVEL: Lithium Lvl: <0.3 mmol/L — ABNORMAL LOW (ref 0.6–1.2)

## 2017-09-13 NOTE — Telephone Encounter (Signed)
Ok to transfer to me

## 2017-09-14 ENCOUNTER — Ambulatory Visit (INDEPENDENT_AMBULATORY_CARE_PROVIDER_SITE_OTHER): Payer: BLUE CROSS/BLUE SHIELD | Admitting: Medical

## 2017-09-14 ENCOUNTER — Telehealth: Payer: Self-pay | Admitting: Medical

## 2017-09-14 ENCOUNTER — Encounter: Payer: Self-pay | Admitting: Medical

## 2017-09-14 VITALS — BP 130/74 | HR 65 | Temp 97.9°F | Resp 16 | Ht 69.0 in | Wt 199.6 lb

## 2017-09-14 DIAGNOSIS — R51 Headache: Secondary | ICD-10-CM | POA: Diagnosis not present

## 2017-09-14 DIAGNOSIS — R519 Headache, unspecified: Secondary | ICD-10-CM

## 2017-09-14 MED ORDER — METOPROLOL SUCCINATE ER 25 MG PO TB24: 25.0000 mg | ORAL_TABLET | Freq: Every day | ORAL | 3 refills | Status: DC

## 2017-09-14 MED ORDER — METHYLPREDNISOLONE ACETATE 40 MG/ML IJ SUSP
40.0000 mg | Freq: Once | INTRAMUSCULAR | Status: AC
  Administered 2017-09-14: 40 mg via INTRAMUSCULAR

## 2017-09-14 MED ORDER — METHOCARBAMOL 500 MG PO TABS: 500.0000 mg | ORAL_TABLET | Freq: Three times a day (TID) | ORAL | 0 refills | Status: DC

## 2017-09-14 NOTE — Patient Instructions (Signed)
For your headaches, we gave you Depo-Medrol 40mg  IM.  You do have recent challenging history of headaches so I did discuss your case with Dr. Etter Sjogren.  We decided to give you a low dose of metoprolol to take daily, change your muscle relaxant to Robaxin, and recommend that you try salon pas patch to the back of your neck.  Periodic massage of trapezius muscles may be helpful as well.  Your light sensitivity causes me to consider migraine type headache.  I am going to send note to your psychiatrist to see if he is okay if we prescribe you Elavil.  Or if he wants to make some adjustments on your medication as described some possible mania recently.  Also going to this ask neurology if they can put you on a waiting list to get you in sooner than your scheduled appointment date.  If any severe worsening/changing features your headache then be seen in the ED this weekend.  Follow-up in 5 to 7 days or as needed.

## 2017-09-14 NOTE — Telephone Encounter (Signed)
Note pt came in today and I addressed his ha. I completed his my chart messages without actually answering them in my chart as I actually talked with him directly in office today.

## 2017-09-14 NOTE — Progress Notes (Signed)
Subjective:    Patient ID: Angel [name], male    DOB: 09-15-1955, 62 y.o.   MRN: [id number]  HPI  Pt in for follow up.  The HA are still persistent. The depomedrol 20 mg im. Helped about half as much as compared to  Toradol which stopped ha completely the other day.(unfortunately nsaids likely to interact with various meds he uses so we can't resort to that repeatedly)  Some light sensitivity when he is our  office. Not as much as when at home. But does noest that often with ha will decrease some going into dark and resting.  Last year he is not sleeping much at all. Previously would go to sleep very late and wake up late. Now goes to sleep and wakes up after appproximately 3-4 hours.  Despite various meds clonazepam, trazadone, and benadryl.  No nausea, vomiting or gross motor/sensory function deficits reported today.  HA moderate. Neck still tight but not rigid.   Depom edrol the other day did decrease tightness the other day. He states slept well that night.  He does report some improvement of ha with neck massage.   Review of Systems  Constitutional: Negative for chills, fatigue and fever.  Respiratory: Negative for cough, chest tightness, shortness of breath and wheezing.   Cardiovascular: Negative for chest pain and palpitations.  Gastrointestinal: Negative for abdominal pain.  Musculoskeletal: Positive for neck pain.  Skin: Negative for rash.  Neurological: Positive for headaches. Negative for dizziness, seizures, speech difficulty, weakness and light-headedness.  Hematological: Negative for adenopathy. Does not bruise/bleed easily.  Psychiatric/Behavioral: Negative for behavioral problems and confusion.    Past Medical History:  Diagnosis Date  . Bipolar disorder ([place])   . 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 Depression   . GERD (gastroesophageal reflux disease)   . Hepatitis B    Hepatitis E. antigen positive by history, status post treatment with  Hepsera and baraclude   . Hepatitis B carrier ([place])   . Personal history of colonic polyps    Adenomas polyp 2008 and 2010, 2015   . Weight loss    Pt. has lost 10 lbs since pre-visit, intentionally with diet and walking     Social History   Socioeconomic History  . Marital status: Married    Spouse name: Not on file  . Number of children: 0  . Years of education: Not on file  . Highest education level: Not on file  Occupational History  . 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 Financial resource strain: Not on file  . Food insecurity:    Worry: Not on file    Inability: Not on file  . Transportation needs:    Medical: Not on file    Non-medical: Not on file  Tobacco Use  . Smoking status: Former Smoker    Types: Cigarettes    Last attempt to quit: 05/08/2009    Years since quitting: 8.3  . Smokeless tobacco: Never Used  Substance and Sexual Activity  . Alcohol use: No    Alcohol/week: 0.0 oz  . Drug use: No  . Sexual activity: Not Currently  Lifestyle  . Physical activity:    Days per week: Not on file    Minutes per session: Not on file  . Stress: Not on file  Relationships  . Social connections:    Talks on phone: Not on file    Gets together: Not on file  Attends religious service: Not on file    Active member of club or organization: Not on file    Attends meetings of clubs or organizations: Not on file    Relationship status: Not on file  . Intimate partner violence:    Fear of current or ex partner: Not on file    Emotionally abused: Not on file    Physically abused: Not on file    Forced sexual activity: Not on file  Other Topics Concern  . Not on file  Social History Narrative  . Not on file    Past Surgical History:  Procedure Laterality Date  . APPENDECTOMY    . COLONOSCOPY    . POLYPECTOMY    . UPPER GASTROINTESTINAL ENDOSCOPY      Family History  Problem Relation Age of Onset  . Heart disease Mother   .  Diabetes Father   . Kidney failure Father   . Kidney disease Father   . Other Sister        GERD  . Colon cancer Neg Hx   . Rectal cancer Neg Hx   . Stomach cancer Neg Hx   . Esophageal cancer Neg Hx     Allergies  Allergen Reactions  . Aripiprazole Other (See Comments)    Tardive Dyskinesia  . Invega [Paliperidone Er] Other (See Comments)    Tardive dyskinsia symptoms  . Abilify [Aripiprazole]   . Lamictal [Lamotrigine] Rash  . Lexapro [Escitalopram Oxalate] Other (See Comments)    Doesn't Work    Current Outpatient Medications on File Prior to Visit  Medication Sig Dispense Refill  . aspirin EC 81 MG tablet Take 81 mg by mouth daily.    . carbamazepine (TEGRETOL-XR) 200 MG 12 hr tablet Take 1 tablet by mouth qam and 2 tablets by mouth qhs (Patient taking differently: 300 mg. Take 1 tablet by mouth qam and 2 tablets by mouth qhs) 90 tablet 2  . clonazePAM (KLONOPIN) 1 MG tablet 2  qhs 60 tablet 5  . diphenhydrAMINE (BENADRYL) 25 MG tablet Take 75 mg by mouth at bedtime.    Marland Kitchen lithium 300 MG tablet Take 1 tablet (300 mg total) by mouth 2 (two) times daily. 60 tablet 2  . lithium 600 MG capsule 1 qam   2  qhs 90 capsule 5  . Melatonin 10 MG SUBL Place 10 mg under the tongue at bedtime.    . nadolol (CORGARD) 40 MG tablet TAKE 1 TABLET BY MOUTH EVERY DAY 30 tablet 0  . pantoprazole (PROTONIX) 40 MG tablet Take 1 tablet (40 mg total) by mouth daily. 90 tablet 3  . predniSONE (DELTASONE) 10 MG tablet 1 tab p q day for 4 days 4 tablet 0  . traZODone (DESYREL) 50 MG tablet 3 daily at bedtime and may repeat with one more pill 100 tablet 3  . venlafaxine XR (EFFEXOR-XR) 150 MG 24 hr capsule Take 1 capsule (150 mg total) by mouth every morning. 30 capsule 8  . VIREAD 300 MG tablet Take 300 mg by mouth daily.      Current Facility-Administered Medications on File Prior to Visit  Medication Dose Route Frequency Provider Last Rate Last Dose  . carbamazepine (EQUETRO) 12 hr capsule 200 mg   200 mg Oral BID Plovsky, Gerald, MD        BP 130/74   Pulse 65   Temp 97.9 F (36.6 C) (Oral)   Resp 16   Ht 5\' 9"  (1.753 m)   Wt  199 lb 9.6 oz (90.5 kg)   SpO2 97%   BMI 29.48 kg/m       Objective:   Physical Exam  General Mental Status- Alert. General Appearance- Not in acute distress.   Skin General: Color- Normal Color. Moisture- Normal Moisture.  Neck Carotid Arteries- Normal color. Moisture- Normal Moisture. No carotid bruits. No JVD. Neck is stiff but not rigid. Can go through parital range of motion.  Chest and Lung Exam Auscultation: Breath Sounds:-Normal.  Cardiovascular Auscultation:Rythm- Regular. Murmurs & Other Heart Sounds:Auscultation of the heart reveals- No Murmurs.  Abdomen Inspection:-Inspeection Normal. Palpation/Percussion:Note:No mass. Palpation and Percussion of the abdomen reveal- Non Tender, Non Distended + BS, no rebound or guarding.    Neurologic Cranial Nerve exam:- CN III-XII intact(No nystagmus), symmetric smile. Drift Test:- No drift. Romberg Exam:- Negative.  Heal to Toe Gait exam:-Normal. Finger to Nose:- Normal/Intact Strength:- 5/5 equal and symmetric strength both upper and lower extremities.      Assessment & Plan:  For your headaches, we gave you Depo-Medrol 40mg  IM.  You do have recent challenging history of headaches so I did discuss your case with Dr. Etter Sjogren.  We decided to give you a low dose of metoprolol to take daily, change your muscle relaxant to Robaxin, and recommend that you try salon pas patch to the back of your neck.  Periodic massage of trapezius muscles may be helpful as well.  Your light sensitivity causes me to consider migraine type headache.  I am going to send note to your psychiatrist to see if he is okay if we prescribe you Elavil.  Or if he wants to make some adjustments on your medication as described some possible mania recently.  Also going to this ask neurology if they can put you on a  waiting list to get you in sooner than your scheduled appointment date.  If any severe worsening/changing features your headache then be seen in the ED this weekend.  Follow-up in 5 to 7 days or as needed.  [name], PA-C

## 2017-09-17 ENCOUNTER — Ambulatory Visit: Payer: BLUE CROSS/BLUE SHIELD | Admitting: Medical

## 2017-09-17 ENCOUNTER — Encounter: Payer: Self-pay | Admitting: Medical

## 2017-09-17 VITALS — BP 95/60 | HR 61 | Temp 98.5°F | Resp 16 | Ht 69.0 in | Wt 200.2 lb

## 2017-09-17 DIAGNOSIS — R519 Headache, unspecified: Secondary | ICD-10-CM

## 2017-09-17 DIAGNOSIS — R51 Headache: Secondary | ICD-10-CM

## 2017-09-17 NOTE — Patient Instructions (Addendum)
Your ha are much better now. I want you to continue with robaxin but just use twice daily.  Your blood pressure seems to have dropped a bit too much with metoprolol 25 mg. I want you to use 1/2 of tab and check bp pressure daily. If bp above 110/70 then can continue but if less than would be best to stop.  Will wait for psychiatrist to respond to our request regarding if he thinks safe to add low dose elavil to your psychiatric meds as well as to help with sleep and HA.  Sent office note message to Dr. Casimiro Needle.  Follow up in 2 weeks or as needed

## 2017-09-17 NOTE — Progress Notes (Signed)
Subjective:    Patient ID: Angel [name], male    DOB: Sep 25, 1955, 62 y.o.   MRN: [id number]  HPI   Pt in for follow up.  He had much better week end. Pt neck pain is about 1/10 now. Pt states HA pain level was 0/10 most weekend. But occasional slight mid ha for 10 minutes(rare now intermittent.) No gross motor or sensory function deficits.   He reports no dizziness or light headed. Pt has bp cuff at home. But did not check this weekend.   Pt is sleeping better since on muscle relaxant. He is sleeping better.      Review of Systems  Constitutional: Negative for chills, fatigue and fever.  HENT: Negative for congestion, dental problem, sinus pressure and sinus pain.   Respiratory: Negative for cough, chest tightness, shortness of breath and wheezing.   Cardiovascular: Negative for chest pain and palpitations.  Gastrointestinal: Negative for abdominal pain.  Musculoskeletal: Positive for neck pain. Negative for back pain, myalgias and neck stiffness.       Better rom now.   Skin: Negative for rash.  Neurological: Positive for headaches. Negative for dizziness, speech difficulty, weakness and numbness.       See hpi.  Hematological: Negative for adenopathy. Does not bruise/bleed easily.  Psychiatric/Behavioral: Negative for behavioral problems and confusion.    Past Medical History:  Diagnosis Date  . Bipolar disorder ([place])   . 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 Depression   . GERD (gastroesophageal reflux disease)   . Hepatitis B    Hepatitis E. antigen positive by history, status post treatment with Hepsera and baraclude   . Hepatitis B carrier ([place])   . Personal history of colonic polyps    Adenomas polyp 2008 and 2010, 2015   . Weight loss    Pt. has lost 10 lbs since pre-visit, intentionally with diet and walking     Social History   Socioeconomic History  . Marital status: Married    Spouse name: Not on file  . Number of children: 0  . Years  of education: Not on file  . Highest education level: Not on file  Occupational History  . 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 Financial resource strain: Not on file  . Food insecurity:    Worry: Not on file    Inability: Not on file  . Transportation needs:    Medical: Not on file    Non-medical: Not on file  Tobacco Use  . Smoking status: Former Smoker    Types: Cigarettes    Last attempt to quit: 05/08/2009    Years since quitting: 8.3  . Smokeless tobacco: Never Used  Substance and Sexual Activity  . Alcohol use: No    Alcohol/week: 0.0 oz  . Drug use: No  . Sexual activity: Not Currently  Lifestyle  . Physical activity:    Days per week: Not on file    Minutes per session: Not on file  . Stress: Not on file  Relationships  . Social connections:    Talks on phone: Not on file    Gets together: Not on file    Attends religious service: Not on file    Active member of club or organization: Not on file    Attends meetings of clubs or organizations: Not on file    Relationship status: Not on file  . Intimate partner violence:    Fear of  current or ex partner: Not on file    Emotionally abused: Not on file    Physically abused: Not on file    Forced sexual activity: Not on file  Other Topics Concern  . Not on file  Social History Narrative  . Not on file    Past Surgical History:  Procedure Laterality Date  . APPENDECTOMY    . COLONOSCOPY    . POLYPECTOMY    . UPPER GASTROINTESTINAL ENDOSCOPY      Family History  Problem Relation Age of Onset  . Heart disease Mother   . Diabetes Father   . Kidney failure Father   . Kidney disease Father   . Other Sister        GERD  . Colon cancer Neg Hx   . Rectal cancer Neg Hx   . Stomach cancer Neg Hx   . Esophageal cancer Neg Hx     Allergies  Allergen Reactions  . Aripiprazole Other (See Comments)    Tardive Dyskinesia  . Invega [Paliperidone Er] Other (See Comments)    Tardive  dyskinsia symptoms  . Abilify [Aripiprazole]   . Lamictal [Lamotrigine] Rash  . Lexapro [Escitalopram Oxalate] Other (See Comments)    Doesn't Work    Current Outpatient Medications on File Prior to Visit  Medication Sig Dispense Refill  . aspirin EC 81 MG tablet Take 81 mg by mouth daily.    . carbamazepine (TEGRETOL-XR) 200 MG 12 hr tablet Take 1 tablet by mouth qam and 2 tablets by mouth qhs (Patient taking differently: 300 mg. Take 1 tablet by mouth qam and 2 tablets by mouth qhs) 90 tablet 2  . clonazePAM (KLONOPIN) 1 MG tablet 2  qhs 60 tablet 5  . diphenhydrAMINE (BENADRYL) 25 MG tablet Take 75 mg by mouth at bedtime.    Marland Kitchen lithium 300 MG tablet Take 1 tablet (300 mg total) by mouth 2 (two) times daily. 60 tablet 2  . lithium 600 MG capsule 1 qam   2  qhs 90 capsule 5  . Melatonin 10 MG SUBL Place 10 mg under the tongue at bedtime.    . methocarbamol (ROBAXIN) 500 MG tablet Take 1 tablet (500 mg total) by mouth 3 (three) times daily. 30 tablet 0  . metoprolol succinate (TOPROL-XL) 25 MG 24 hr tablet Take 1 tablet (25 mg total) by mouth daily. 14 tablet 3  . nadolol (CORGARD) 40 MG tablet TAKE 1 TABLET BY MOUTH EVERY DAY 30 tablet 0  . pantoprazole (PROTONIX) 40 MG tablet Take 1 tablet (40 mg total) by mouth daily. 90 tablet 3  . predniSONE (DELTASONE) 10 MG tablet 1 tab p q day for 4 days 4 tablet 0  . traZODone (DESYREL) 50 MG tablet 3 daily at bedtime and may repeat with one more pill 100 tablet 3  . venlafaxine XR (EFFEXOR-XR) 150 MG 24 hr capsule Take 1 capsule (150 mg total) by mouth every morning. 30 capsule 8  . VIREAD 300 MG tablet Take 300 mg by mouth daily.      Current Facility-Administered Medications on File Prior to Visit  Medication Dose Route Frequency Provider Last Rate Last Dose  . carbamazepine (EQUETRO) 12 hr capsule 200 mg  200 mg Oral BID Plovsky, Gerald, MD        BP 95/60   Pulse 61   Temp 98.5 F (36.9 C) (Oral)   Resp 16   Ht 5\' 9"  (1.753 m)   Wt  200 lb 3.2 oz (  90.8 kg)   SpO2 99%   BMI 29.56 kg/m       Objective:   Physical Exam  General Mental Status- Alert. General Appearance- Not in acute distress.   Skin General: Color- Normal Color. Moisture- Normal Moisture.  Neck Carotid Arteries- Normal color. Moisture- Normal Moisture.  No JVD. Mild rt side trapezius tenderness to palpatoin today. No rigidity today . Better rom today compared to other day.  Chest and Lung Exam Auscultation: Breath Sounds:-Normal.  Cardiovascular Auscultation:Rythm- Regular. Murmurs & Other Heart Sounds:Auscultation of the heart reveals- No Murmurs.  Neurologic Cranial Nerve exam:- CN III-XII intact(No nystagmus), symmetric smile. Drift Test:- No drift. Romberg Exam:- Negative.  Heal to Toe Gait exam:-Normal. Finger to Nose:- Normal/Intact Strength:- 5/5 equal and symmetric strength both upper and lower extremities.      Assessment & Plan:  Your ha are much better now. I want you to continue with robaxin but just use twice daily.  Your blood pressure seems to have dropped a bit too much with metoprolol 25 mg. I want you to use 1/2 of tab and check bp pressure daily. If bp above 110/70 then can continue but if less than would be best to stop.  Will wait for psychiatrist to respond to our request regarding if he thinks safe to add low dose elavil to your psychiatric meds as well as to help with sleep and HA.  Follow up in 2 weeks or as needed  [employer], PA-C

## 2017-09-22 ENCOUNTER — Other Ambulatory Visit: Payer: Self-pay | Admitting: Medical

## 2017-09-24 NOTE — Telephone Encounter (Signed)
methocarbamol refill Last OV: 09/14/17 Last Refill:09/14/17 #30 tabs Pharmacy:[facility] [name] PCP: [name] PA-C

## 2017-09-24 NOTE — Telephone Encounter (Signed)
Copied from [place] [id number]. Topic: Quick Communication - Rx Refill/Question >> Sep 24, 2017 10:12 AM Percell Belt A wrote: Medication: methocarbamol (ROBAXIN) 500 MG tablet [[id number]]   Has the patient contacted their pharmacy? No  (Agent: If no, request that the patient contact the pharmacy for the refill.) (Agent: If yes, when and what did the pharmacy advise?)  Preferred Pharmacy (with phone number or street name): [facility] [address] [employer] [facility] [employer] [address]: Please be advised that RX refills may take up to 3 business days. We ask that you follow-up with your pharmacy.

## 2017-10-03 ENCOUNTER — Ambulatory Visit: Payer: BLUE CROSS/BLUE SHIELD | Admitting: Medical

## 2017-10-05 ENCOUNTER — Encounter: Payer: Self-pay | Admitting: Medical

## 2017-10-05 ENCOUNTER — Ambulatory Visit (INDEPENDENT_AMBULATORY_CARE_PROVIDER_SITE_OTHER): Payer: BLUE CROSS/BLUE SHIELD | Admitting: Medical

## 2017-10-05 VITALS — BP 126/70 | HR 69 | Temp 98.2°F | Resp 16 | Ht 69.0 in | Wt 207.6 lb

## 2017-10-05 DIAGNOSIS — M542 Cervicalgia: Secondary | ICD-10-CM

## 2017-10-05 DIAGNOSIS — R51 Headache: Secondary | ICD-10-CM

## 2017-10-05 DIAGNOSIS — R519 Headache, unspecified: Secondary | ICD-10-CM

## 2017-10-05 NOTE — Progress Notes (Signed)
Subjective:    Patient ID: Angel [name], male    DOB: April 29, 1956, 62 y.o.   MRN: [id number]  HPI  Pt in for follow up.  He has very rare headache now(actually no ha recently per pt). Regimen seems to be working. He is taking metoprolol 1/2 tab at night. Using robaxin only one tablet at night. Neck still little tight at time(but responds to robaxin). Also finding comfortable way to sleep with pillows that seem to decrease neck pain.  Some oversedation with robaxin at 500 mg dose. He wants to consider lower dose.  Pt seeing Dr. Tomi Likens next Thursday for hx of of ha and imaging findings. Also upcoming appointment with GI md and psychiatrist.  I considered elavil and sent message to psychiatrist but did not get answer back.   Review of Systems  Constitutional: Negative for chills, fatigue and fever.  Respiratory: Negative for chest tightness, shortness of breath and wheezing.   Cardiovascular: Negative for chest pain and palpitations.  Gastrointestinal: Negative for abdominal distention, abdominal pain and anal bleeding.  Musculoskeletal: Positive for neck pain. Negative for gait problem.  Skin: Negative for rash.  Neurological: Negative for dizziness and headaches.  Hematological: Negative for adenopathy. Does not bruise/bleed easily.  Psychiatric/Behavioral: Negative for behavioral problems, confusion, self-injury and sleep disturbance. The patient is not nervous/anxious.     Past Medical History:  Diagnosis Date  . Bipolar disorder ([place])   . 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 Depression   . GERD (gastroesophageal reflux disease)   . Hepatitis B    Hepatitis E. antigen positive by history, status post treatment with Hepsera and baraclude   . Hepatitis B carrier ([place])   . Personal history of colonic polyps    Adenomas polyp 2008 and 2010, 2015   . Weight loss    Pt. has lost 10 lbs since pre-visit, intentionally with diet and walking     Social History    Socioeconomic History  . Marital status: Married    Spouse name: Not on file  . Number of children: 0  . Years of education: Not on file  . Highest education level: Not on file  Occupational History  . 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 Financial resource strain: Not on file  . Food insecurity:    Worry: Not on file    Inability: Not on file  . Transportation needs:    Medical: Not on file    Non-medical: Not on file  Tobacco Use  . Smoking status: Former Smoker    Types: Cigarettes    Last attempt to quit: 05/08/2009    Years since quitting: 8.4  . Smokeless tobacco: Never Used  Substance and Sexual Activity  . Alcohol use: No    Alcohol/week: 0.0 oz  . Drug use: No  . Sexual activity: Not Currently  Lifestyle  . Physical activity:    Days per week: Not on file    Minutes per session: Not on file  . Stress: Not on file  Relationships  . Social connections:    Talks on phone: Not on file    Gets together: Not on file    Attends religious service: Not on file    Active member of club or organization: Not on file    Attends meetings of clubs or organizations: Not on file    Relationship status: Not on file  . Intimate partner violence:    Fear  of current or ex partner: Not on file    Emotionally abused: Not on file    Physically abused: Not on file    Forced sexual activity: Not on file  Other Topics Concern  . Not on file  Social History Narrative  . Not on file    Past Surgical History:  Procedure Laterality Date  . APPENDECTOMY    . COLONOSCOPY    . POLYPECTOMY    . UPPER GASTROINTESTINAL ENDOSCOPY      Family History  Problem Relation Age of Onset  . Heart disease Mother   . Diabetes Father   . Kidney failure Father   . Kidney disease Father   . Other Sister        GERD  . Colon cancer Neg Hx   . Rectal cancer Neg Hx   . Stomach cancer Neg Hx   . Esophageal cancer Neg Hx     Allergies  Allergen Reactions  .  Aripiprazole Other (See Comments)    Tardive Dyskinesia  . Invega [Paliperidone Er] Other (See Comments)    Tardive dyskinsia symptoms  . Abilify [Aripiprazole]   . Lamictal [Lamotrigine] Rash  . Lexapro [Escitalopram Oxalate] Other (See Comments)    Doesn't Work    Current Outpatient Medications on File Prior to Visit  Medication Sig Dispense Refill  . aspirin EC 81 MG tablet Take 81 mg by mouth daily.    . carbamazepine (TEGRETOL-XR) 200 MG 12 hr tablet Take 1 tablet by mouth qam and 2 tablets by mouth qhs (Patient taking differently: 300 mg. Take 1 tablet by mouth qam and 2 tablets by mouth qhs) 90 tablet 2  . clonazePAM (KLONOPIN) 1 MG tablet 2  qhs 60 tablet 5  . diphenhydrAMINE (BENADRYL) 25 MG tablet Take 75 mg by mouth at bedtime.    Marland Kitchen lithium 600 MG capsule 1 qam   2  qhs 90 capsule 5  . Melatonin 10 MG SUBL Place 10 mg under the tongue at bedtime.    . methocarbamol (ROBAXIN) 500 MG tablet TAKE 1 TABLET(500 MG) BY MOUTH THREE TIMES DAILY 30 tablet 4  . metoprolol succinate (TOPROL-XL) 25 MG 24 hr tablet Take 1 tablet (25 mg total) by mouth daily. 14 tablet 3  . nadolol (CORGARD) 40 MG tablet TAKE 1 TABLET BY MOUTH EVERY DAY 30 tablet 0  . traZODone (DESYREL) 50 MG tablet 3 daily at bedtime and may repeat with one more pill 100 tablet 3  . venlafaxine XR (EFFEXOR-XR) 150 MG 24 hr capsule Take 1 capsule (150 mg total) by mouth every morning. 30 capsule 8  . VIREAD 300 MG tablet Take 300 mg by mouth daily.      Current Facility-Administered Medications on File Prior to Visit  Medication Dose Route Frequency Provider Last Rate Last Dose  . carbamazepine (EQUETRO) 12 hr capsule 200 mg  200 mg Oral BID Plovsky, Gerald, MD        BP 126/70   Pulse 69   Temp 98.2 F (36.8 C) (Oral)   Resp 16   Ht 5\' 9"  (1.753 m)   Wt 207 lb 9.6 oz (94.2 kg)   SpO2 99%   BMI 30.66 kg/m       Objective:   Physical Exam  General Mental Status- Alert. General Appearance- Not in acute  distress.   Skin General: Color- Normal Color. Moisture- Normal Moisture.  Neck Carotid Arteries- Normal color. Moisture- Normal Moisture. No carotid bruits. No JVD. Good range of  motion. Rt trapezius faint tender to palpation.  Chest and Lung Exam Auscultation: Breath Sounds:-Normal.  Cardiovascular Auscultation:Rythm- Regular. Murmurs & Other Heart Sounds:Auscultation of the heart reveals- No Murmurs.  . Neurologic Cranial Nerve exam:- CN III-XII intact(No nystagmus), symmetric smile. Drift Test:- No drift. Romberg Exam:- Negative.  Heal to Toe Gait exam:-Normal. Finger to Nose:- Normal/Intact  Strength:- 5/5 equal and symmetric strength both upper and lower extremities.     Assessment & Plan:  I am satisfied that your headaches have subsided  presently except for described oversedation.  I want you to try half a tablet of the Robaxin at night.  Continue with half tablet of metoprolol at night as well.  Continue other current medications.  Please update me when you are about to run out of the Robaxin and I can refill that prescription.  Your neurologist will send [place] an update after your visit and we will review that note.  Also will review other specialist notes.   Everything goes well recommend follow-up late September or early October.

## 2017-10-05 NOTE — Patient Instructions (Addendum)
I am satisfied that your headaches have subsided  presently except for described oversedation.  I want you to try half a tablet of the Robaxin at night.  Continue with half tablet of metoprolol at night as well.  Continue other current medications.  Please update me when you are about to run out of the Robaxin and I can refill that prescription.  Your neurologist will send [place] an update after your visit and we will review that note.  Also will review other specialist notes.   Everything goes well recommend follow-up late September or early October.

## 2017-10-11 ENCOUNTER — Encounter

## 2017-10-11 ENCOUNTER — Encounter: Payer: Self-pay | Admitting: Neurology

## 2017-10-11 ENCOUNTER — Ambulatory Visit (INDEPENDENT_AMBULATORY_CARE_PROVIDER_SITE_OTHER): Payer: BLUE CROSS/BLUE SHIELD | Admitting: Neurology

## 2017-10-11 ENCOUNTER — Other Ambulatory Visit: Payer: BLUE CROSS/BLUE SHIELD

## 2017-10-11 VITALS — BP 104/62 | HR 60 | Ht 70.5 in | Wt 208.0 lb

## 2017-10-11 DIAGNOSIS — G609 Hereditary and idiopathic neuropathy, unspecified: Secondary | ICD-10-CM

## 2017-10-11 DIAGNOSIS — Z1321 Encounter for screening for nutritional disorder: Secondary | ICD-10-CM | POA: Diagnosis not present

## 2017-10-11 DIAGNOSIS — G4486 Cervicogenic headache: Secondary | ICD-10-CM

## 2017-10-11 DIAGNOSIS — R51 Headache: Secondary | ICD-10-CM

## 2017-10-11 DIAGNOSIS — M47812 Spondylosis without myelopathy or radiculopathy, cervical region: Secondary | ICD-10-CM | POA: Diagnosis not present

## 2017-10-11 NOTE — Patient Instructions (Addendum)
1.  Refer to orthopedics regarding neck arthritis 2.  For neuropathy, will check B1, B12, folate, TSH and order NCV-EMG of lower extremities  Your provider has requested that you have labwork completed today. Please go to [facility] ([id number]) on the second floor of this building before leaving the office today. You do not need to check in. If you are not called within 15 minutes please check with the front desk.

## 2017-10-11 NOTE — Progress Notes (Signed)
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  He is accompanied by his husband who supplements history.  History supplemented by PCP notes.  He has no prior history of headaches.  About 2 months ago, he developed neck pain and headache.  Headache is mainly in the occipital region but also may be behind his eyes.  It is a 7/10 nonthrobbing pain.  It was a constant persistent headache that was aggravated by bending over or moving a lot.  Laying down to rest helped relieve it.  There was some associated photophobia but no nausea, vomiting, phonophobia, visual disturbance or unilateral numbness or weakness.  Neck pain is primarily right sided.  There is no radicular pain, weakness or numbness down the arms.  He would use Salonpas to help relieve the pain.  However, headache subsided since he started Robaxin at bedtime.  He says it makes him too sleepy.  Neck pain is improved but still there.  In February, he got new glasses which caused visual problems.  It took a couple of months since he found a pair of glasses that was correct.  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 CT cervical spine from 09/07/17 personally reviewed and demonstrated multilevel advanced degenerative osteophytosis at the enterior C1-odontoid articulation in setting of C2-C3 ankylosis, as well as severe right sided facet arthropathy at C3-C4 and C4-C5 and ankyloses at C5-C6.  Current NSAIDS:  ASA 81mg  Current analgesics:  no Current triptans:  no Current anti-emetic:  no Current muscle relaxants:   Robaxin 500mg  Current anti-anxiolytic:  clonazepam Current sleep aide:  melatonin, clonazepam, trazodone Current Antihypertensive medications:  Toprol-XL 25mg , nadolol 40mg  Current Antidepressant medications:  velanfaxine XR 150mg  Current Anticonvulsant medications:  carbamazepine ER 300mg  in AM and 600mg  QHS Current Vitamins/Herbal/Supplements:  no Current Antihistamines/Decongestants:  Benadryl Other therapy:  Salonpas Other medication:  Lithium  Caffeine:  1 gallon of diet iced tea Alcohol:  No Smoker:  No Diet:  Tea, juice.  No soda or water. Exercise:  Not routine Depression:  Stable.  Anxiety:  No Other pain:  No Sleep:  Sleeps too much since starting Robaxin.  Also takes melatonin, clonazepam and trazodone.  Of note, he reports unsteadiness on his feet.  He has tripped and fallen a couple of times.   PAST MEDICAL HISTORY: Past Medical History:  Diagnosis Date  . Bipolar disorder ([place])   . 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 Depression   . GERD (gastroesophageal reflux disease)   . Headache   . Hepatitis B    Hepatitis E. antigen positive by history, status post treatment with Hepsera and baraclude   . Hepatitis B carrier ([place])   . Personal history of colonic polyps    Adenomas polyp 2008 and 2010, 2015   . Weight loss    Pt. has lost 10 lbs since pre-visit, intentionally with diet and walking    PAST SURGICAL HISTORY: Past Surgical History:  Procedure Laterality Date  . APPENDECTOMY    . COLONOSCOPY    . POLYPECTOMY    . UPPER GASTROINTESTINAL ENDOSCOPY  MEDICATIONS: Robaxin Toprol Nadolol Trazodone Clonazepam Venlafaxine Tegretol Lithium  ALLERGIES: Allergies  Allergen Reactions  . Aripiprazole Other (See Comments)    Tardive Dyskinesia  . Invega [Paliperidone Er] Other (See Comments)    Tardive dyskinsia symptoms  . Abilify [Aripiprazole]   . Lamictal [Lamotrigine] Rash  . Lexapro [Escitalopram Oxalate] Other (See  Comments)    Doesn't Work    FAMILY HISTORY: Family History  Problem Relation Age of Onset  . Heart disease Mother   . Diabetes Father   . Kidney failure Father   . Kidney disease Father   . Other Sister        GERD  . Colon cancer Neg Hx   . Rectal cancer Neg Hx   . Stomach cancer Neg Hx   . Esophageal cancer Neg Hx     SOCIAL HISTORY: Social History   Socioeconomic History  . Marital status: Married    Spouse name: [name]  . Number of children: 0  . Years of education: Not on file  . Highest education level: Master's degree (e.g., MA, MS, MEng, MEd, MSW, MBA)  Occupational History  . 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 Financial resource strain: Not on file  . Food insecurity:    Worry: Not on file    Inability: Not on file  . Transportation needs:    Medical: Not on file    Non-medical: Not on file  Tobacco Use  . Smoking status: Former Smoker    Types: Cigarettes    Last attempt to quit: 05/08/2009    Years since quitting: 8.4  . Smokeless tobacco: Never Used  Substance and Sexual Activity  . Alcohol use: No    Alcohol/week: 0.0 oz  . Drug use: No  . Sexual activity: Not Currently  Lifestyle  . Physical activity:    Days per week: Not on file    Minutes per session: Not on file  . Stress: Not on file  Relationships  . Social connections:    Talks on phone: Not on file    Gets together: Not on file    Attends religious service: Not on file    Active member of club or organization: Not on file    Attends meetings of clubs or organizations: Not on file    Relationship status: Not on file  . Intimate partner violence:    Fear of current or ex partner: Not on file    Emotionally abused: Not on file    Physically abused: Not on file    Forced sexual activity: Not on file  Other Topics Concern  . Not on file  Social History Narrative   Patient is right-handed. He is married to same sex partner. He drinks 6-9 glasses of  tea a day. He gets little exercise.    REVIEW OF SYSTEMS: Constitutional: No fevers, chills, or sweats, no generalized fatigue, change in appetite Eyes: No visual changes, double vision, eye pain Ear, nose and throat: No hearing loss, ear pain, nasal congestion, sore throat Cardiovascular: No chest pain, palpitations Respiratory:  No shortness of breath at rest or with exertion, wheezes GastrointestinaI: No nausea, vomiting, diarrhea, abdominal pain, fecal incontinence Genitourinary:  No dysuria, urinary retention or frequency Musculoskeletal:  Neck pain Integumentary: No rash, pruritus, skin lesions Neurological: as above Psychiatric: No depression, anxiety Endocrine: No palpitations, fatigue, diaphoresis, mood swings, change in appetite, change in weight, increased thirst Hematologic/Lymphatic:  No purpura, petechiae. Allergic/Immunologic: no itchy/runny  eyes, nasal congestion, recent allergic reactions, rashes  PHYSICAL EXAM: Vitals:   10/11/17 0930  BP: 104/62  Pulse: 60  SpO2: 97%   General: No acute distress.  Patient appears well-groomed.  Head:  Normocephalic/atraumatic Eyes:  fundi examined but not visualized Neck: supple, right paraspinal tenderness, decreased range of motion Back: No paraspinal tenderness Heart: regular rate and rhythm Lungs: Clear to auscultation bilaterally. Vascular: No carotid bruits. Neurological Exam: Mental status: alert and oriented to person, place, and time, recent and remote memory intact, fund of knowledge intact, attention and concentration intact, speech fluent and not dysarthric, language intact. Cranial nerves: CN I: not tested CN II: pupils equal, round and reactive to light, visual fields intact CN III, IV, VI:  full range of motion, no nystagmus, no ptosis CN V: facial sensation intact CN VII: upper and lower face symmetric CN VIII: hearing intact CN IX, X: gag intact, uvula midline CN XI: sternocleidomastoid and trapezius  muscles intact CN XII: tongue midline Bulk & Tone: normal, no fasciculations. Motor:  5/5 throughout  Sensation:   Pinprick sensation intact; vibration sensation intact reduced in feet. Deep Tendon Reflexes:  1+ throughout, toes downgoing. Finger to nose testing:  Without dysmetria.  Heel to shin:  Without dysmetria.  Gait:  Normal station and stride.  Able to turn, difficulty with tandem walk. Romberg negative.  IMPRESSION: 1.  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  Peripheral neuropathy  PLAN: 1.  Refer to orthopedics regarding neck arthritis 2.  For neuropathy, will check B1, B12, folate, TSH and order NCV-EMG of lower extremities  Thank you for allowing me to take part in the care of this patient.  Metta Clines, DO  CC: [name], PA-C

## 2017-10-16 ENCOUNTER — Encounter: Payer: Self-pay | Admitting: Neurology

## 2017-10-17 ENCOUNTER — Other Ambulatory Visit: Payer: Self-pay | Admitting: *Deleted

## 2017-10-17 DIAGNOSIS — R200 Anesthesia of skin: Secondary | ICD-10-CM

## 2017-10-17 LAB — VITAMIN B1: Vitamin B1 (Thiamine): 50 nmol/L — ABNORMAL HIGH (ref 8–30)

## 2017-10-17 LAB — FOLATE: Folate: >24 ng/mL

## 2017-10-17 LAB — VITAMIN B12: VITAMIN B 12: 741 pg/mL (ref 200–1100)

## 2017-10-17 LAB — TSH: TSH: 2.43 mIU/L (ref 0.40–4.50)

## 2017-10-18 ENCOUNTER — Encounter (INDEPENDENT_AMBULATORY_CARE_PROVIDER_SITE_OTHER): Payer: Self-pay | Admitting: Orthopaedic Surgery

## 2017-10-18 ENCOUNTER — Ambulatory Visit (INDEPENDENT_AMBULATORY_CARE_PROVIDER_SITE_OTHER): Payer: BLUE CROSS/BLUE SHIELD | Admitting: Orthopaedic Surgery

## 2017-10-18 ENCOUNTER — Telehealth: Payer: Self-pay

## 2017-10-18 DIAGNOSIS — M542 Cervicalgia: Secondary | ICD-10-CM

## 2017-10-18 DIAGNOSIS — M5030 Other cervical disc degeneration, unspecified cervical region: Secondary | ICD-10-CM

## 2017-10-18 NOTE — Telephone Encounter (Signed)
Called and left lab results on VM

## 2017-10-18 NOTE — Progress Notes (Signed)
Office Visit Note   Patient: Angel [name]           Date of Birth: 1955/05/28           MRN: [id number] Visit Date: 10/18/2017              Requested by: [name], PA-C [place], [place] [address] PCP: [name], PA-C   Assessment & Plan: Visit Diagnoses:  1. Cervicalgia     Plan: Impression is cervical spine spondylosis and pain.  Patient has failed conservative treatment.  He has tried prednisone taper, NSAIDs, Robaxin without any significant relief.  We will order a cervical spine MRI at this point to rule out structural abnormalities.  We will try to coordinate this with his upcoming abdominal MRI.  Follow-up in 2 weeks. Total face to face encounter time was greater than 45 minutes and over half of this time was spent in counseling and/or coordination of care.  Follow-Up Instructions: Return in about 2 weeks (around 11/01/2017).   Orders:  No orders of the defined types were placed in this encounter.  No orders of the defined types were placed in this encounter.     Procedures: No procedures performed   Clinical Data: No additional findings.   Subjective: Chief Complaint  Patient presents with  . Neck - Pain    Angel [name] is a very pleasant 62 year old gentleman with 1 month history of neck pain without radicular symptoms.  He has seen his neurologist for severe headaches and the work-up has been negative.  He is continued to have neck pain.  He did undergo CT scan of the cervical spine which showed severe degenerative disc disease at C5-6.  He denies any signs or symptoms of myelopathy.  Denies any bowel or bladder incontinence.  Denies any numbness and tingling or radicular symptoms.   Review of Systems  Constitutional: Negative.   All other systems reviewed and are negative.    Objective: Vital Signs: There were no vitals taken for this visit.  Physical Exam  Constitutional: He is oriented to person, place, and time.  He appears well-developed and well-nourished.  HENT:  Head: Normocephalic and atraumatic.  Eyes: Pupils are equal, round, and reactive to light.  Neck: Neck supple.  Pulmonary/Chest: Effort normal.  Abdominal: Soft.  Musculoskeletal: Normal range of motion.  Neurological: He is alert and oriented to person, place, and time.  Skin: Skin is warm.  Psychiatric: He has a normal mood and affect. His behavior is normal. Judgment and thought content normal.  Nursing note and vitals reviewed.   Ortho Exam Cervical spine exam shows generalized stiffness and discomfort with range of motion.  Negative Spurling's.  Negative or meets.  Normal reflexes.  No motor or sensory deficits. Specialty Comments:  No specialty comments available.  Imaging: No results found.   PMFS History: Patient Active Problem List   Diagnosis Date Noted  . Hyperglycemia 10/07/2013  . Hepatic cirrhosis (Angel [name]) 10/03/2013  . Bipolar I disorder, most recent episode (or current) depressed, unspecified 05/21/2013  . Hepatitis B 03/21/2013  . Esophageal reflux 03/21/2013  . Personal history of colonic polyps 03/21/2013  . Bipolar disorder, unspecified (Angel [name]) 09/03/2011   Past Medical History:  Diagnosis Date  . Bipolar disorder ([place])   . 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 Depression   . GERD (gastroesophageal reflux disease)   . Headache   . Hepatitis B    Hepatitis E. antigen positive  by history, status post treatment with Hepsera and baraclude   . Hepatitis B carrier (Angel [name])   . Personal history of colonic polyps    Adenomas polyp 2008 and 2010, 2015   . Weight loss    Pt. has lost 10 lbs since pre-visit, intentionally with diet and walking    Family History  Problem Relation Age of Onset  . Heart disease Mother   . Diabetes Father   . Kidney failure Father   . Kidney disease Father   . Other Sister        GERD  . Colon cancer Neg Hx   . Rectal cancer Neg Hx   . Stomach cancer Neg Hx   .  Esophageal cancer Neg Hx     Past Surgical History:  Procedure Laterality Date  . APPENDECTOMY    . COLONOSCOPY    . POLYPECTOMY    . UPPER GASTROINTESTINAL ENDOSCOPY     Social History   Occupational History  . Occupation: retired    Fish farm manager: NOT EMPLOYED  Tobacco Use  . Smoking status: Former Smoker    Types: Cigarettes    Last attempt to quit: 05/08/2009    Years since quitting: 8.4  . Smokeless tobacco: Never Used  Substance and Sexual Activity  . Alcohol use: No    Alcohol/week: 0.0 oz  . Drug use: No  . Sexual activity: Not Currently

## 2017-10-18 NOTE — Telephone Encounter (Signed)
-----   Message from Pieter Partridge, DO sent at 10/17/2017  8:45 AM EDT ----- Labs look okay

## 2017-10-19 DIAGNOSIS — Z79899 Other long term (current) drug therapy: Secondary | ICD-10-CM | POA: Diagnosis not present

## 2017-10-19 DIAGNOSIS — B181 Chronic viral hepatitis B without delta-agent: Secondary | ICD-10-CM | POA: Diagnosis not present

## 2017-10-23 ENCOUNTER — Telehealth (INDEPENDENT_AMBULATORY_CARE_PROVIDER_SITE_OTHER): Payer: Self-pay | Admitting: *Deleted

## 2017-10-23 ENCOUNTER — Ambulatory Visit: Admit: 2017-10-23 | Discharge: 2017-10-23 | Payer: MEDICARE

## 2017-10-23 ENCOUNTER — Encounter: Admit: 2017-10-23 | Discharge: 2017-10-23 | Payer: MEDICARE | Attending: Gastroenterology | Primary: Gastroenterology

## 2017-10-23 DIAGNOSIS — B181 Chronic viral hepatitis B without delta-agent: Secondary | ICD-10-CM

## 2017-10-23 DIAGNOSIS — K7469 Other cirrhosis of liver: Secondary | ICD-10-CM

## 2017-10-23 DIAGNOSIS — K7460 Unspecified cirrhosis of liver: Principal | ICD-10-CM

## 2017-10-23 DIAGNOSIS — Z6828 Body mass index (BMI) 28.0-28.9, adult: Secondary | ICD-10-CM | POA: Diagnosis not present

## 2017-10-23 DIAGNOSIS — F319 Bipolar disorder, unspecified: Secondary | ICD-10-CM | POA: Diagnosis not present

## 2017-10-23 DIAGNOSIS — K766 Portal hypertension: Secondary | ICD-10-CM | POA: Diagnosis not present

## 2017-10-23 DIAGNOSIS — Z23 Encounter for immunization: Secondary | ICD-10-CM | POA: Diagnosis not present

## 2017-10-23 MED ORDER — TENOFOVIR DISOPROXIL FUMARATE 300 MG TABLET
ORAL_TABLET | Freq: Every day | ORAL | 3 refills | 0 days | Status: CP
Start: 2017-10-23 — End: 2018-02-04

## 2017-10-23 NOTE — Telephone Encounter (Signed)
-----   Message from [place] sent at 10/23/2017  9:17 AM EDT ----- Regarding: FW: MRI PLEASE ADVISE.   ----- Message ----- From: Pamella Pert, RMA Sent: 10/23/2017   8:57 AM To: Maureen Chatters, CMA, Precious Bard, RMA Subject: MRI                                            Pt has a scan today at [facility] for his liver and had asked if he could also have his MRI C spine at the same time. His scan is today and that he is not sch for this MRI. Asked if he can have a call back his appt is at 11:300 today. [phone]

## 2017-10-23 NOTE — Telephone Encounter (Signed)
I called sw pt and advised him that I will call [facility] and see if can go the procedure today but serously doubt that they can with his appt being today at 12pm, I called [place] at [phone] and left message with Shelly Flatten to return my call to see if can get scan do today, pending call back

## 2017-10-25 ENCOUNTER — Ambulatory Visit (INDEPENDENT_AMBULATORY_CARE_PROVIDER_SITE_OTHER): Payer: BLUE CROSS/BLUE SHIELD | Admitting: Neurology

## 2017-10-25 DIAGNOSIS — M5417 Radiculopathy, lumbosacral region: Secondary | ICD-10-CM

## 2017-10-25 DIAGNOSIS — R200 Anesthesia of skin: Secondary | ICD-10-CM | POA: Diagnosis not present

## 2017-10-25 NOTE — Procedures (Signed)
[facility] Neurology  [place], [address]  [place], [place] [address] Tel: [phone] Fax:  [phone] Test Date:  10/25/2017  Patient: Angel [name] DOB: [birth year] Physician: Narda Amber, DO  Sex: Male Height: 5\' 10"  Ref Phys: Metta Clines, DO  ID#: [id number] Temp: 34.2C Technician:    Patient Complaints: This is a 62 year old man with gait imbalance referred for evaluation of neuropathy.  NCV & EMG Findings: Extensive electrodiagnostic testing of the right lower extremity and additional studies of the left shows:  1. Bilateral sural and superficial peroneal sensory responses are within normal limits. 2. Right peroneal motor response at the extensor digitorum brevis is absent, and normal at the tibialis anterior. Bilateral tibial and left peroneal motor responses are within normal limits. 3. Right tibial H reflex study is within normal limits. 4. Motor axon loss changes are seen affecting the right anterior tibialis and gluteus medius muscles, without accompanied active denervation. These findings are not present in the left lower extremity.  Impression: Chronic L5 radiculopathy affecting the right lower extremity, mild in degree electrically.  There is no evidence of a large fiber sensorimotor polyneuropathy affecting the lower extremities.   ___________________________ Narda Amber, DO    Nerve Conduction Studies Anti Sensory Summary Table   Site NR Peak (ms) Norm Peak (ms) P-T Amp (V) Norm P-T Amp  Left Sup Peroneal Anti Sensory (Ant Lat Mall)  34.2C  12 cm    2.9 <4.6 8.3 >3  Right Sup Peroneal Anti Sensory (Ant Lat Mall)  34.2C  12 cm    3.5 <4.6 6.1 >3  Left Sural Anti Sensory (Lat Mall)  34.2C  Calf    3.8 <4.6 6.5 >3  Right Sural Anti Sensory (Lat Mall)  34.2C  Calf    3.3 <4.6 7.1 >3   Motor Summary Table   Site NR Onset (ms) Norm Onset (ms) O-P Amp (mV) Norm O-P Amp Site1 Site2 Delta-0 (ms) Dist (cm) Vel (m/s) Norm Vel (m/s)  Left Peroneal  Motor (Ext Dig Brev)  34.2C  Ankle    4.4 <6.0 3.0 >2.5 B Fib Ankle 8.5 38.0 45 >40  B Fib    12.9  2.4  Poplt B Fib 1.6 8.0 50 >40  Poplt    14.5  2.3         Right Peroneal Motor (Ext Dig Brev)  34.2C  Ankle NR  <6.0  >2.5 B Fib Ankle  0.0  >40  B Fib NR     Poplt B Fib  0.0  >40  Poplt NR            Left Peroneal TA Motor (Tib Ant)  34.2C  Fib Head    3.5 <4.5 4.8 >3 Poplit Fib Head 1.1 8.0 73 >40  Poplit    4.6  4.8         Right Peroneal TA Motor (Tib Ant)  34.2C  Fib Head    3.2 <4.5 5.0 >3 Poplit Fib Head 1.3 9.0 69 >40  Poplit    4.5  4.8         Left Tibial Motor (Abd Hall Brev)  34.2C  Ankle    5.0 <6.0 6.9 >4 Knee Ankle 8.4 41.0 49 >40  Knee    13.4  6.1         Right Tibial Motor (Abd Hall Brev)  34.2C  Ankle    4.1 <6.0 6.0 >4 Knee Ankle 8.7 40.0 46 >40  Knee    12.8  4.7          H Reflex Studies   NR H-Lat (ms) Lat Norm (ms) L-R H-Lat (ms)  Right Tibial (Gastroc)  34.2C     33.20 <35    EMG   Side Muscle Ins Act Fibs Psw Fasc Number Recrt Dur Dur. Amp Amp. Poly Poly. Comment  Right AntTibialis Nml Nml Nml Nml 1- Rapid Few 1+ Few 1+ Nml Nml N/A  Right Gastroc Nml Nml Nml Nml Nml Nml Nml Nml Nml Nml Nml Nml N/A  Right Flex Dig Long Nml Nml Nml Nml Nml Nml Nml Nml Nml Nml Nml Nml N/A  Right RectFemoris Nml Nml Nml Nml Nml Nml Nml Nml Nml Nml Nml Nml N/A  Right GluteusMed Nml Nml Nml Nml 1- Rapid Some 1+ Some 1+ Nml Nml N/A  Left AntTibialis Nml Nml Nml Nml Nml Nml Nml Nml Nml Nml Nml Nml N/A  Left Gastroc Nml Nml Nml Nml Nml Nml Nml Nml Nml Nml Nml Nml N/A  Left Flex Dig Long Nml Nml Nml Nml Nml Nml Nml Nml Nml Nml Nml Nml N/A  Left RectFemoris Nml Nml Nml Nml Nml Nml Nml Nml Nml Nml Nml Nml N/A  Left GluteusMed Nml Nml Nml Nml Nml Nml Nml Nml Nml Nml Nml Nml N/A      Waveforms:

## 2017-10-26 ENCOUNTER — Telehealth: Payer: Self-pay

## 2017-10-26 DIAGNOSIS — R2681 Unsteadiness on feet: Secondary | ICD-10-CM

## 2017-10-26 NOTE — Telephone Encounter (Signed)
Called and spoke with Pt, advised him of EMG results, and P/T referral if he is having balance issues and is  agreeable. Pt wishes to proceed with P/T

## 2017-10-26 NOTE — Telephone Encounter (Signed)
-----   Message from Alda Berthold, DO sent at 10/26/2017 11:05 AM EDT ----- Please inform patient that his nerve testing shows mild impingement in the back on the right side, no signs of neuropathy.  If he is having problems with balance, we can refer him for PT gait training. Thanks.

## 2017-10-27 ENCOUNTER — Ambulatory Visit (HOSPITAL_BASED_OUTPATIENT_CLINIC_OR_DEPARTMENT_OTHER)
Admission: RE | Admit: 2017-10-27 | Discharge: 2017-10-27 | Disposition: A | Discharge status: Home / Self Care | Payer: BLUE CROSS/BLUE SHIELD | Source: Ambulatory Visit | Attending: Orthopaedic Surgery | Admitting: Orthopaedic Surgery

## 2017-10-27 DIAGNOSIS — M2578 Osteophyte, vertebrae: Secondary | ICD-10-CM | POA: Diagnosis not present

## 2017-10-27 DIAGNOSIS — M4722 Other spondylosis with radiculopathy, cervical region: Secondary | ICD-10-CM | POA: Diagnosis not present

## 2017-10-27 DIAGNOSIS — M5030 Other cervical disc degeneration, unspecified cervical region: Secondary | ICD-10-CM | POA: Diagnosis not present

## 2017-10-27 DIAGNOSIS — M542 Cervicalgia: Secondary | ICD-10-CM | POA: Diagnosis not present

## 2017-10-27 DIAGNOSIS — M4802 Spinal stenosis, cervical region: Secondary | ICD-10-CM | POA: Diagnosis not present

## 2017-11-01 ENCOUNTER — Ambulatory Visit (HOSPITAL_COMMUNITY): Payer: BLUE CROSS/BLUE SHIELD | Admitting: Psychiatry

## 2017-11-01 ENCOUNTER — Encounter (HOSPITAL_COMMUNITY): Payer: Self-pay | Admitting: Psychiatry

## 2017-11-01 VITALS — BP 123/70 | HR 61 | Ht 70.5 in | Wt 206.6 lb

## 2017-11-01 DIAGNOSIS — F331 Major depressive disorder, recurrent, moderate: Secondary | ICD-10-CM

## 2017-11-01 DIAGNOSIS — F319 Bipolar disorder, unspecified: Secondary | ICD-10-CM | POA: Diagnosis not present

## 2017-11-01 DIAGNOSIS — F324 Major depressive disorder, single episode, in partial remission: Secondary | ICD-10-CM

## 2017-11-01 DIAGNOSIS — F330 Major depressive disorder, recurrent, mild: Secondary | ICD-10-CM

## 2017-11-01 MED ORDER — CARBAMAZEPINE 200 MG PO TABS: 200.0000 mg | ORAL_TABLET | Freq: Three times a day (TID) | ORAL | 2 refills | Status: DC

## 2017-11-01 MED ORDER — CARBAMAZEPINE ER 200 MG PO TB12: ORAL_TABLET | ORAL | 5 refills | Status: DC

## 2017-11-01 MED ORDER — CARBAMAZEPINE ER 200 MG PO TB12: 300.0000 mg | ORAL_TABLET | Freq: Three times a day (TID) | ORAL | 5 refills | Status: DC

## 2017-11-01 MED ORDER — TRAZODONE HCL 50 MG PO TABS: ORAL_TABLET | ORAL | 3 refills | Status: DC

## 2017-11-01 MED ORDER — CARBAMAZEPINE ER 200 MG PO CP12: 200.0000 mg | ORAL_CAPSULE | Freq: Two times a day (BID) | ORAL | Status: AC

## 2017-11-01 MED ORDER — LITHIUM CARBONATE 600 MG PO CAPS: ORAL_CAPSULE | ORAL | 5 refills | Status: DC

## 2017-11-01 NOTE — Progress Notes (Signed)
 Patient ID: Angel [name], male   DOB: 04-17-1956, 62 y.o.   MRN: [id number] [facility] MD Progress Note  11/01/2017 4:10 PM Angel [name]  MRN:  [id number] Subjective:  Feeling great Principal Problem: Bipolar disorder, most recent episode depression Today for the most part the patient is doing well.He's had a lot of things happen. He had extreme headaches and ultimately was placed on Robaxin  500 mg. It ended up being too much and he slept too much. Now he takes half of a 500 and he sleeps well but it's noted by the patient and area his partner that his mania has completely resolved. Previously the patient was demonstrating some mild symptoms of hypomania. Now he is taking Robxine he sleeps well and his mood is much more even. He continues taking Tegretol  200 mg 1 in the morning and 2 at night. He continues taking lithium  600 mg 1 in the morning and 2 at night. I think is contributing to his mood stability. The patient denies depression denies euphoria. He denies irritability. He denies racing thinking. His speech is normal. Today his partner [name] was with us  by phone. The patient also takes Klonopin  1 mg 2 at night. He also takes Effexor  150 mg which is very helpful for him. The patient had a good lithium  level in the last 6 months. Overall he seems to have minimal stresses.He is a good relationship with his partner. His health is actually stable. Even though he seen a number specialist ultimately his liver is stable. Is no significant cardiovascular problems. Past Medical History:  Past Medical History:  Diagnosis Date   Bipolar disorder (HCC)    Cirrhosis of liver without mention of alcohol    Depression    GERD (gastroesophageal reflux disease)    Headache    Hepatitis B    Hepatitis E. 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 has lost  10 lbs since pre-visit, intentionally with diet and walking    Past Surgical History:  Procedure Laterality Date   APPENDECTOMY     COLONOSCOPY     POLYPECTOMY     UPPER GASTROINTESTINAL ENDOSCOPY     Family History:  Family History  Problem Relation Age of Onset   Heart disease Mother    Diabetes Father    Kidney failure Father    Kidney disease Father    Other Sister        GERD   Colon cancer Neg Hx    Rectal cancer Neg Hx    Stomach cancer Neg Hx    Esophageal cancer Neg Hx    Family Psychiatric  History:  Social History:  Social History   Substance and Sexual Activity  Alcohol Use No   Alcohol/week: 0.0 oz     Social History   Substance and Sexual Activity  Drug Use No    Social History   Socioeconomic History   Marital status: Married    Spouse name: Jenene Miser   Number of children: 0   Years of education: Not on file   Highest education level: Master's degree (e.g., MA, MS, MEng, MEd, MSW, MBA)  Occupational History   Occupation: retired    Associate Professor: NOT EMPLOYED  Social Network engineer strain: Not on file   Food insecurity:  Worry: Not on file    Inability: Not on file   Transportation needs:    Medical: Not on file    Non-medical: Not on file  Tobacco Use   Smoking status: Former Smoker    Types: Cigarettes    Last attempt to quit: 05/08/2009    Years since quitting: 8.4   Smokeless tobacco: Never Used  Substance and Sexual Activity   Alcohol use: No    Alcohol/week: 0.0 oz   Drug use: No   Sexual activity: Not Currently  Lifestyle   Physical activity:    Days per week: Not on file    Minutes per session: Not on file   Stress: Not on file  Relationships   Social connections:    Talks on phone: Not on file    Gets together: Not on file    Attends religious service: Not on file    Active member of club or organization: Not on file    Attends meetings of clubs or organizations: Not on file    Relationship status: Not on  file  Other Topics Concern   Not on file  Social History Narrative   Patient is right-handed. He is married to same sex partner. He drinks 6-9 glasses of tea a day. He gets little exercise.   Additional Social History:                         Sleep: Good  Appetite:  Good  Current Medications:   Lab Results: No results found for this or any previous visit (from the past 48 hour(s)).  Physical Findings: AIMS:  , ,  ,  ,    CIWA:    COWS:     Musculoskeletal: Strength & Muscle Tone: within normal limits Gait & Station: normal Patient leans: N/A  Psychiatric Specialty Exam: ROSE [name] is resistant Patient ID: Angel [name], male   DOB: January 31, 1956, 62 y.o.   MRN: [id number] [facility] MD Progress Note  11/01/2017 4:10 PM Laine Giovanetti  MRN:  [id number] Subjective:  Feeling great Principal Problem: Bipolar disorder, most recent episode depression Today the patient is seen with his partner care. [name] patient is doing well. His mood is stable. He shows no signs of mania. His sleep is a little erratic but is not really that big of a problem. He's eating well has good energy enjoying life. He uses no alcohol or drugs. His hepatitis B cirrhosis is stable. Medically he is feeling well. Initially he stable. He's got good relationship with his partner with others around him. There is no evidence of alcohol or drug use in this individual. He takes his medicines just as prescribed. Past Medical History:  Past Medical History:  Diagnosis Date   Bipolar disorder (HCC)    Cirrhosis of liver without mention of alcohol    Depression    GERD (gastroesophageal reflux disease)    Headache    Hepatitis B    Hepatitis E. 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 has lost 10 lbs since pre-visit, intentionally with diet and walking     Past Surgical History:  Procedure Laterality Date   APPENDECTOMY     COLONOSCOPY     POLYPECTOMY     UPPER GASTROINTESTINAL ENDOSCOPY  Family History:  Family History  Problem Relation Age of Onset   Heart disease Mother    Diabetes Father    Kidney failure Father    Kidney disease Father    Other Sister        GERD   Colon cancer Neg Hx    Rectal cancer Neg Hx    Stomach cancer Neg Hx    Esophageal cancer Neg Hx    Family Psychiatric  History:  Social History:  Social History   Substance and Sexual Activity  Alcohol Use No   Alcohol/week: 0.0 oz     Social History   Substance and Sexual Activity  Drug Use No    Social History   Socioeconomic History   Marital status: Married    Spouse name: Jenene Miser   Number of children: 0   Years of education: Not on file   Highest education level: Master's degree (e.g., MA, MS, MEng, MEd, MSW, MBA)  Occupational History   Occupation: retired    Associate Professor: NOT EMPLOYED  Social Network engineer strain: Not on file   Food insecurity:    Worry: Not on file    Inability: Not on file   Transportation needs:    Medical: Not on file    Non-medical: Not on file  Tobacco Use   Smoking status: Former Smoker    Types: Cigarettes    Last attempt to quit: 05/08/2009    Years since quitting: 8.4   Smokeless tobacco: Never Used  Substance and Sexual Activity   Alcohol use: No    Alcohol/week: 0.0 oz   Drug use: No   Sexual activity: Not Currently  Lifestyle   Physical activity:    Days per week: Not on file    Minutes per session: Not on file   Stress: Not on file  Relationships   Social connections:    Talks on phone: Not on file    Gets together: Not on file    Attends religious service: Not on file    Active member of club or organization: Not on file    Attends meetings of clubs or organizations: Not on file    Relationship status: Not on file  Other Topics Concern   Not on file  Social History  Narrative   Patient is right-handed. He is married to same sex partner. He drinks 6-9 glasses of tea a day. He gets little exercise.   Additional Social History:                         Sleep: Good  Appetite:  Good  Current Medications:    Lab Results: No results found for this or any previous visit (from the past 48 hour(s)).  Physical Findings: AIMS:  , ,  ,  ,    CIWA:    COWS:     Musculoskeletal: Strength & Muscle Tone: within normal limits Gait & Station: normal Patient leans: N/A  Psychiatric Specialty Exam: ROS  Blood pressure 123/70, pulse 61, height 5' 10.5" (1.791 m), weight 206 lb 9.6 oz (93.7 kg), SpO2 98 %.Body mass index is 29.23 kg/m.  General Appearance: Casual  Eye Contact:: Good   Speech:  Clear and Coherent  Volume:  Normal  Mood:  Euthymic  Affect:  NA and Appropriate  Thought Process:  Coherent  Orientation:  Full (Time, Place, and Person)  Thought Content:  WDL  Suicidal Thoughts:  No  Homicidal  Thoughts:  No  Memory:  NA  Judgement:  Good  Insight:  Good  Psychomotor Activity:  Normal  Concentration:  Good  Recall:  Good  Fund of Knowledge:Good  Language: Good  Akathisia:  No  Handed:  Right  AIMS (if indicated):     Assets:  Desire for Improvement  ADL's:  Intact  Cognition: WNL  Sleep:      Treatment Plan Summary: 11/01/2017,  At this time the patient takes lithium  600 mg 1 in the morning and 2 at night he continues taking trazodone  for sleep and continues Tegretol  as prescribed. He no longer takes any neuroleptics. He described a little bit of a movie or sound disorder that might be related to tardive dyskinesia. Today had aims scale showed no evidence of TD. The patient continue taking his Effexor  as prescribed continue all the other medicines. Patient actually is very stable. Return to see me in 3 months.  Blood pressure 123/70, pulse 61, height 5' 10.5" (1.791 m), weight 206 lb 9.6 oz (93.7 kg), SpO2 98 %.Body mass  index is 29.23 kg/m.  General Appearance: Casual  Eye Contact:: Good   Speech:  Clear and Coherent  Volume:  Normal  Mood:  Euthymic  Affect:  NA and Appropriate  Thought Process:  Coherent  Orientation:  Full (Time, Place, and Person)  Thought Content:  WDL  Suicidal Thoughts:  No  Homicidal Thoughts:  No  Memory:  NA  Judgement:  Good  Insight:  Good  Psychomotor Activity:  Normal  Concentration:  Good  Recall:  Good  Fund of Knowledge:Good  Language: Good  Akathisia:  No  Handed:  Right  AIMS (if indicated):     Assets:  Desire for Improvement  ADL's:  Intact  Cognition: WNL  Sleep:      Treatment Plan Summary: 11/01/2017,  At this time the patient is doing well. Continue taking trazodone  but we'll reduce the dose from 300 mg down to 200 mg. Will continue taking lithium  600 mg 1 in the morning and 2 at night, Tegretol  200 mg 1 in the morning and 2 at night continue Effexor  XR 50 mg continue taking Robaxin  500 mg half a pill at night. The patient continue taking Klonopin  1 mg 2 at night. The patient is not oversedated. His mood is even. He's functioning very well. He never demonstrate any psychotic symptomatology. This patient to return to see me in 10 weeks.

## 2017-11-05 ENCOUNTER — Ambulatory Visit: Payer: BLUE CROSS/BLUE SHIELD | Attending: Neurology | Admitting: Physical Therapy

## 2017-11-05 ENCOUNTER — Encounter: Payer: Self-pay | Admitting: Physical Therapy

## 2017-11-05 ENCOUNTER — Other Ambulatory Visit: Payer: Self-pay

## 2017-11-05 DIAGNOSIS — R2689 Other abnormalities of gait and mobility: Secondary | ICD-10-CM

## 2017-11-05 DIAGNOSIS — R2681 Unsteadiness on feet: Secondary | ICD-10-CM

## 2017-11-05 DIAGNOSIS — M6281 Muscle weakness (generalized): Secondary | ICD-10-CM | POA: Diagnosis not present

## 2017-11-05 NOTE — Therapy (Signed)
[facility] [address]  [place], [place], [address] Phone: [phone]   Fax:  [phone]  Physical Therapy Evaluation  Patient Details  Name: Angel [name] MRN: [id number] Date of Birth: 1956/04/22 Referring Provider: Metta Clines, DO   Encounter Date: 11/05/2017  PT End of Session - 11/05/17 1231    Visit Number  1    Number of Visits  13    Date for PT Re-Evaluation  12/17/17    Authorization Type  [employer]    PT Start Time  1016    PT Stop Time  1058    PT Time Calculation (min)  42 min    Activity Tolerance  Patient tolerated treatment well    Behavior During Therapy  [facility] for tasks assessed/performed       Past Medical History:  Diagnosis Date  . Bipolar disorder ([place])   . 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 Depression   . GERD (gastroesophageal reflux disease)   . Headache   . Hepatitis B    Hepatitis E. antigen positive by history, status post treatment with Hepsera and baraclude   . Hepatitis B carrier ([place])   . Personal history of colonic polyps    Adenomas polyp 2008 and 2010, 2015   . Weight loss    Pt. has lost 10 lbs since pre-visit, intentionally with diet and walking    Past Surgical History:  Procedure Laterality Date  . APPENDECTOMY    . COLONOSCOPY    . POLYPECTOMY    . UPPER GASTROINTESTINAL ENDOSCOPY      There were no vitals filed for this visit.   Subjective Assessment - 11/05/17 1019    Subjective  Patient reports 6 month history of imbalance and falls. Notes he has noticed he been failing the "police test" walking heel-toe at the MD. Has had 3 falls in the past 6 months- walking each time. Once tripped over ottoman in the dark and bruised L shin- no other injuries. Denies dizziness and N/T.  Reports he seems to "walk hard" down the steps like he can't control the way down. Reports neck pain of 1 month duration with insidious onset, mostly on R side- is going to  orthopedic MD tomorrow for this.    Limitations  Walking;House hold activities    Diagnostic tests  10/25/17 NCV with EMG of B LE: Chronic L5 radiculopathy affecting the right lower extremity, mild in degree electrically. (Affecting R extensor digitorum, anterior tibialis, glute med)  There is no evidence of a large fiber sensorimotor polyneuropathy affecting the lower extremities.    Patient Stated Goals  get better balance    Currently in Pain?  Yes    Pain Score  3     Pain Location  Neck    Pain Orientation  Right    Pain Descriptors / Indicators  Aching    Pain Type  Acute pain    Aggravating Factors   turning both ways to back out of a parking lot    Pain Relieving Factors  muscle relaxants, laying down         [facility] PT Assessment - 11/05/17 0001      Assessment   Medical Diagnosis  Unsteady gait    Referring Provider  Metta Clines, DO    Onset Date/Surgical Date  05/08/17    Hand Dominance  Right    Next MD Visit  Not scheduled    Prior Therapy  No      Precautions   Precautions  Other (comment) Hepatitis B carrier      Restrictions   Weight Bearing Restrictions  No      Balance Screen   Has the patient fallen in the past 6 months  Yes    How many times?  3    Has the patient had a decrease in activity level because of a fear of falling?   Yes    Is the patient reluctant to leave their home because of a fear of falling?   Yes      [place] residence    Living Arrangements  Spouse/significant other    Available Help at Discharge  Family    Type of [place] Access  Level entry    [place]  Two level    Alternate Level Stairs-Number of Steps  14    Alternate Level Stairs-Rails  Can reach both    [place]  None      Prior Function   Level of Independence  Independent    Vocation  Retired    Leisure  walking around the neighborhood, going Publishing rights manager   Overall Cognitive Status  Within  Functional Limits for tasks assessed      Observation/Other Assessments   Focus on Therapeutic Outcomes (FOTO)   NT- next visit      Sensation   Light Touch  Appears Intact      Coordination   Gross Motor Movements are Fluid and Coordinated  Yes      Posture/Postural Control   Posture/Postural Control  Postural limitations    Postural Limitations  Rounded Shoulders;Forward head;Increased lumbar lordosis      ROM / Strength   AROM / PROM / Strength  Strength;AROM      AROM   AROM Assessment Site  Ankle    Right/Left Ankle  Right;Left    Right Ankle Dorsiflexion  23    Left Ankle Dorsiflexion  23      Strength   Strength Assessment Site  Hip;Knee;Ankle    Right/Left Hip  Right;Left    Right Hip Flexion  4-/5 pain in R thigh    Right Hip ABduction  4/5    Right Hip ADduction  4+/5    Left Hip Flexion  4/5    Left Hip ABduction  4+/5    Left Hip ADduction  4+/5    Right/Left Knee  Right;Left    Right Knee Flexion  4/5    Right Knee Extension  4+/5 pain in R thigh    Left Knee Flexion  4+/5    Left Knee Extension  4+/5    Right/Left Ankle  Right;Left    Right Ankle Dorsiflexion  4+/5    Right Ankle Plantar Flexion  4+/5    Left Ankle Dorsiflexion  4+/5    Left Ankle Plantar Flexion  4+/5      Ambulation/Gait   Assistive device  None    Gait Pattern  Step-through pattern;Lateral trunk lean to left;Lateral hip instability B feet inverted    Ambulation Surface  Level;Indoor      Standardized Balance Assessment   Standardized Balance Assessment  Dynamic Gait Index    Balance Master Testing  Other/comments      Dynamic Gait Index   Level Surface  Mild Impairment R foot IR, L trunk lean with trendelenberg, lordosis  Change in Gait Speed  Normal    Gait with Horizontal Head Turns  Normal    Gait with Vertical Head Turns  Normal    Gait and Pivot Turn  Normal    Step Over Obstacle  Normal    Step Around Obstacles  Normal    Steps  Mild Impairment loud stomping when  ascending- unable to control    Total Score  22      High Level Balance   High Level Balance Comments  SLS- L 5 sec, R unable                Objective measurements completed on examination: See above findings.              PT Education - 11/05/17 1231    Education Details  prognosis, HEP, POC    Person(s) Educated  Patient    Methods  Explanation;Demonstration;Tactile cues;Verbal cues;Handout    Comprehension  Returned demonstration;Verbalized understanding       PT Short Term Goals - 11/05/17 1238      PT SHORT TERM GOAL #1   Title  Patient to be independent with initial HEP.    Time  3    Period  Weeks    Status  New    Target Date  11/26/17        PT Long Term Goals - 11/05/17 1239      PT LONG TERM GOAL #1   Title  Patient to be independent with advanced HEP.    Time  6    Period  Weeks    Status  New    Target Date  12/17/17      PT LONG TERM GOAL #2   Title  Patient to demonstrate 4+/5 strength in B LEs.    Time  6    Period  Weeks    Status  New    Target Date  12/17/17      PT LONG TERM GOAL #3   Title  Patient to demonstrate SLS 10 sec on each LE without LOB.    Time  6    Period  Weeks    Status  New    Target Date  12/17/17      PT LONG TERM GOAL #4   Title  Patient to demonstrate mild sway and no LOB with M-CTSIB EC/foam surface.    Time  6    Period  Weeks    Status  New    Target Date  12/17/17      PT LONG TERM GOAL #5   Title  Patient to demonstrate reciprocal stair climbing without handrail and with good eccentric control throughout without evidence of instability.    Time  6    Period  Weeks    Status  New    Target Date  12/17/17             Plan - 11/05/17 1232    Clinical Impression Statement  Patient is a 62y/o M presenting to OPPT with reports of imbalance and 3 falls in the past 6 months. Reports falls usually happen when he is walking- last fall occurred when tripping over an ottoman in the dark.  Patient today with mildly decreased strength, gait deviations such as R foot inverted, L trunk lean with Trendelenburg, pronounced lordosis, and decreased SLS balance. Although patient scored within normal range on DGI, would benefit from skilled PT services 2x.week for 6 weeks to address aforementioned impairments.  Patient educated on simple balance exercises and hip strengthening for HEP- advised to perform balance exercises at counter in case of falls. Reported understanding.     Clinical Presentation  Stable    Clinical Presentation due to:  Hepatitis B carrier, bipolar, cirrhosis of liver, depression, GERD    Clinical Decision Making  Low    Rehab Potential  Good    PT Frequency  2x / week    PT Duration  6 weeks    PT Treatment/Interventions  ADLs/Self Care Home Management;Cryotherapy;Electrical Stimulation;Moist Heat;Ultrasound;DME Instruction;Gait training;Stair training;Functional mobility training;Therapeutic activities;Therapeutic exercise;Manual techniques;Orthotic Fit/Training;Patient/family education;Neuromuscular re-education;Balance training;Passive range of motion;Dry needling;Energy conservation;Splinting;Taping;Vasopneumatic Device    PT Next Visit Plan  reassess HEP, M-CTSIB, FOTO    Consulted and Agree with Plan of Care  Patient       Patient will benefit from skilled therapeutic intervention in order to improve the following deficits and impairments:  Abnormal gait, Decreased strength, Pain, Decreased balance, Decreased mobility, Difficulty walking, Improper body mechanics, Postural dysfunction  Visit Diagnosis: Unsteadiness on feet  Other abnormalities of gait and mobility  Muscle weakness (generalized)     Problem List Patient Active Problem List   Diagnosis Date Noted  . Hyperglycemia 10/07/2013  . Hepatic cirrhosis ([place]) 10/03/2013  . Bipolar I disorder, most recent episode (or current) depressed, unspecified 05/21/2013  . Hepatitis B 03/21/2013  . Esophageal  reflux 03/21/2013  . Personal history of colonic polyps 03/21/2013  . Bipolar disorder, unspecified ([place]) 09/03/2011    Janene Harvey, PT, DPT 11/05/17 12:45 PM  [facility] [address]  [place], [place], [address] Phone: [phone]   Fax:  [phone]  Name: Angel [name] MRN: [id number] Date of Birth: 1956-04-20

## 2017-11-06 ENCOUNTER — Encounter (INDEPENDENT_AMBULATORY_CARE_PROVIDER_SITE_OTHER): Payer: Self-pay | Admitting: Orthopaedic Surgery

## 2017-11-06 ENCOUNTER — Ambulatory Visit (INDEPENDENT_AMBULATORY_CARE_PROVIDER_SITE_OTHER): Payer: BLUE CROSS/BLUE SHIELD | Admitting: Orthopaedic Surgery

## 2017-11-06 DIAGNOSIS — M5412 Radiculopathy, cervical region: Secondary | ICD-10-CM

## 2017-11-06 MED ORDER — METHOCARBAMOL 500 MG PO TABS: 500.0000 mg | ORAL_TABLET | Freq: Four times a day (QID) | ORAL | 2 refills | Status: DC | PRN

## 2017-11-06 NOTE — Progress Notes (Signed)
Office Visit Note   Patient: Angel [name]           Date of Birth: [birth year]           MRN: [id number] Visit Date: 11/06/2017              Requested by: [name], PA-C [place], [place] [address] PCP: [name], PA-C   Assessment & Plan: Visit Diagnoses:  1. Cervical radiculopathy     Plan: Impression multilevel degenerative disc disease and spinal stenosis and foraminal stenosis worst at C4-5 and 5-6.  Images were reviewed with the patient today.  Recommendation is for evaluation with Dr. Ernestina Patches for epidural steroid injection.  This was discussed with the patient and he is in agreement.  Follow-Up Instructions: Return if symptoms worsen or fail to improve.   Orders:  No orders of the defined types were placed in this encounter.  Meds ordered this encounter  Medications  . methocarbamol (ROBAXIN) 500 MG tablet    Sig: Take 1 tablet (500 mg total) by mouth every 6 (six) hours as needed for muscle spasms.    Dispense:  30 tablet    Refill:  2      Procedures: No procedures performed   Clinical Data: No additional findings.   Subjective: Chief Complaint  Patient presents with  . Neck - Pain, Follow-up    Patient is here for MRI review.  His symptoms are mainly on the right side.   Review of Systems   Objective: Vital Signs: There were no vitals taken for this visit.  Physical Exam  Ortho Exam Exam unchanged Specialty Comments:  No specialty comments available.  Imaging: No results found.   PMFS History: Patient Active Problem List   Diagnosis Date Noted  . Hyperglycemia 10/07/2013  . Hepatic cirrhosis ([place]) 10/03/2013  . Bipolar I disorder, most recent episode (or current) depressed, unspecified 05/21/2013  . Hepatitis B 03/21/2013  . Esophageal reflux 03/21/2013  . Personal history of colonic polyps 03/21/2013  . Bipolar disorder, unspecified ([place]) 09/03/2011   Past Medical History:  Diagnosis Date   . Bipolar disorder ([place])   . 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 Depression   . GERD (gastroesophageal reflux disease)   . Headache   . Hepatitis B    Hepatitis E. antigen positive by history, status post treatment with Hepsera and baraclude   . Hepatitis B carrier ([place])   . Personal history of colonic polyps    Adenomas polyp 2008 and 2010, 2015   . Weight loss    Pt. has lost 10 lbs since pre-visit, intentionally with diet and walking    Family History  Problem Relation Age of Onset  . Heart disease Mother   . Diabetes Father   . Kidney failure Father   . Kidney disease Father   . Other Sister        GERD  . Colon cancer Neg Hx   . Rectal cancer Neg Hx   . Stomach cancer Neg Hx   . Esophageal cancer Neg Hx     Past Surgical History:  Procedure Laterality Date  . APPENDECTOMY    . COLONOSCOPY    . POLYPECTOMY    . UPPER GASTROINTESTINAL ENDOSCOPY     Social History   Occupational History  . Occupation: retired    Fish farm manager: NOT EMPLOYED  Tobacco Use  . Smoking status: Former Smoker    Types: Cigarettes  Last attempt to quit: 05/08/2009    Years since quitting: 8.5  . Smokeless tobacco: Never Used  Substance and Sexual Activity  . Alcohol use: No    Alcohol/week: 0.0 oz  . Drug use: No  . Sexual activity: Not Currently

## 2017-11-13 ENCOUNTER — Ambulatory Visit: Payer: BLUE CROSS/BLUE SHIELD

## 2017-11-13 DIAGNOSIS — R2689 Other abnormalities of gait and mobility: Secondary | ICD-10-CM | POA: Diagnosis not present

## 2017-11-13 DIAGNOSIS — R2681 Unsteadiness on feet: Secondary | ICD-10-CM | POA: Diagnosis not present

## 2017-11-13 DIAGNOSIS — M6281 Muscle weakness (generalized): Secondary | ICD-10-CM | POA: Diagnosis not present

## 2017-11-13 NOTE — Therapy (Signed)
[facility] [address]  [place], [place], [address] Phone: [phone]   Fax:  [phone]  Physical Therapy Treatment  Patient Details  Name: Angel [name] MRN: [id number] Date of Birth: [birth year] Referring Provider: Metta Clines, DO   Encounter Date: 11/13/2017  PT End of Session - 11/13/17 1540    Visit Number  2    Number of Visits  13    Date for PT Re-Evaluation  12/17/17    Authorization Type  BCBS    PT Start Time  1530    PT Stop Time  1610    PT Time Calculation (min)  40 min    Activity Tolerance  Patient tolerated treatment well    Behavior During Therapy  [facility] for tasks assessed/performed       Past Medical History:  Diagnosis Date  . Bipolar disorder ([place])   . 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 Depression   . GERD (gastroesophageal reflux disease)   . Headache   . Hepatitis B    Hepatitis E. antigen positive by history, status post treatment with Hepsera and baraclude   . Hepatitis B carrier ([place])   . Personal history of colonic polyps    Adenomas polyp 2008 and 2010, 2015   . Weight loss    Pt. has lost 10 lbs since pre-visit, intentionally with diet and walking    Past Surgical History:  Procedure Laterality Date  . APPENDECTOMY    . COLONOSCOPY    . POLYPECTOMY    . UPPER GASTROINTESTINAL ENDOSCOPY      There were no vitals filed for this visit.  Subjective Assessment - 11/13/17 1539    Subjective  Pt. noting HEP going well.      Diagnostic tests  10/25/17 NCV with EMG of B LE: Chronic L5 radiculopathy affecting the right lower extremity, mild in degree electrically. (Affecting R extensor digitorum, anterior tibialis, glute med)  There is no evidence of a large fiber sensorimotor polyneuropathy affecting the lower extremities.    Patient Stated Goals  get better balance    Currently in Pain?  Yes    Pain Score  3     Pain Location  Neck    Pain Orientation  Right     Pain Descriptors / Indicators  Aching    Pain Type  Acute pain    Aggravating Factors   rotation head in car     Pain Relieving Factors  muscle relaxants, laying down    Multiple Pain Sites  No                       [facility] Adult PT Treatment/Exercise - 11/13/17 1547      Neuro Re-ed    Neuro Re-ed Details   at counter: B tandem stance 2 x 30 sec each, B SLS 3 x 10 sec each way; toe walking x 2 laps at counter, heel walking x 2 laps, side steppingx 2 laps, tandem walk forward/backwards x 2 laps, Figure-8 walking working on keeping upright gaze around cones and consistent pacing, Alternating high knee march x 2 laps down/back; Supervision/CGA provided thorughout all activities         Knee/Hip Exercises: Aerobic   Nustep  Lvl 5, 8 min  discussed pt. status and HEP tolerance       Knee/Hip Exercises: Standing   Heel Raises  Both;15 reps    Heel Raises Limitations  counter       Knee/Hip Exercises: Sidelying   Clams  B clam shell x 10 reps                PT Short Term Goals - 11/13/17 1541      PT SHORT TERM GOAL #1   Title  Patient to be independent with initial HEP.    Time  3    Period  Weeks    Status  On-going        PT Long Term Goals - 11/13/17 1541      PT LONG TERM GOAL #1   Title  Patient to be independent with advanced HEP.    Time  6    Period  Weeks    Status  On-going      PT LONG TERM GOAL #2   Title  Patient to demonstrate 4+/5 strength in B LEs.    Time  6    Period  Weeks    Status  On-going      PT LONG TERM GOAL #3   Title  Patient to demonstrate SLS 10 sec on each LE without LOB.    Time  6    Period  Weeks    Status  On-going      PT LONG TERM GOAL #4   Title  Patient to demonstrate mild sway and no LOB with M-CTSIB EC/foam surface.    Time  6    Period  Weeks    Status  On-going      PT LONG TERM GOAL #5   Title  Patient to demonstrate reciprocal stair climbing without handrail and with good eccentric control  throughout without evidence of instability.    Time  6    Period  Weeks    Status  On-going            Plan - 11/13/17 1543    Clinical Impression Statement  Angel [name] noting no issues with HEP.  Tolerated all balance and LE strengthening activities in session well today in standing with supervision/CGA provided from therapist throughout as pt. with occasional subtle LOB.  Most difficulty with backwards tandem walk in today's session.  Will continue to progress toward goals.      PT Treatment/Interventions  ADLs/Self Care Home Management;Cryotherapy;Electrical Stimulation;Moist Heat;Ultrasound;DME Instruction;Gait training;Stair training;Functional mobility training;Therapeutic activities;Therapeutic exercise;Manual techniques;Orthotic Fit/Training;Patient/family education;Neuromuscular re-education;Balance training;Passive range of motion;Dry needling;Energy conservation;Splinting;Taping;Vasopneumatic Device    Consulted and Agree with Plan of Care  Patient       Patient will benefit from skilled therapeutic intervention in order to improve the following deficits and impairments:  Abnormal gait, Decreased strength, Pain, Decreased balance, Decreased mobility, Difficulty walking, Improper body mechanics, Postural dysfunction  Visit Diagnosis: Unsteadiness on feet  Other abnormalities of gait and mobility  Muscle weakness (generalized)     Problem List Patient Active Problem List   Diagnosis Date Noted  . Hyperglycemia 10/07/2013  . Hepatic cirrhosis ([place]) 10/03/2013  . Bipolar I disorder, most recent episode (or current) depressed, unspecified 05/21/2013  . Hepatitis B 03/21/2013  . Esophageal reflux 03/21/2013  . Personal history of colonic polyps 03/21/2013  . Bipolar disorder, unspecified ([place]) 09/03/2011    Bess Harvest, PTA 11/13/17 6:22 PM   [facility] [address]  [place], [place], [address] Phone:  [phone]   Fax:  [phone]  Name: Angel [name] MRN: [id number] Date of Birth: [birth year]

## 2017-11-14 ENCOUNTER — Encounter (INDEPENDENT_AMBULATORY_CARE_PROVIDER_SITE_OTHER): Payer: Self-pay | Admitting: Orthopaedic Surgery

## 2017-11-15 ENCOUNTER — Ambulatory Visit: Payer: BLUE CROSS/BLUE SHIELD

## 2017-11-15 ENCOUNTER — Other Ambulatory Visit (INDEPENDENT_AMBULATORY_CARE_PROVIDER_SITE_OTHER): Payer: Self-pay

## 2017-11-15 DIAGNOSIS — R2689 Other abnormalities of gait and mobility: Secondary | ICD-10-CM | POA: Diagnosis not present

## 2017-11-15 DIAGNOSIS — R2681 Unsteadiness on feet: Secondary | ICD-10-CM | POA: Diagnosis not present

## 2017-11-15 DIAGNOSIS — M6281 Muscle weakness (generalized): Secondary | ICD-10-CM | POA: Diagnosis not present

## 2017-11-15 DIAGNOSIS — M5412 Radiculopathy, cervical region: Secondary | ICD-10-CM

## 2017-11-15 NOTE — Therapy (Signed)
[facility] [address]  [place], [place], [address] Phone: [phone]   Fax:  [phone]  Physical Therapy Treatment  Patient Details  Name: Angel [name] MRN: [id number] Date of Birth: 25-Jan-1956 Referring Provider: Metta Clines, DO   Encounter Date: 11/15/2017  PT End of Session - 11/15/17 1502    Visit Number  3    Number of Visits  13    Date for PT Re-Evaluation  12/17/17    Authorization Type  BCBS    PT Start Time  1445    PT Stop Time  1530    PT Time Calculation (min)  45 min    Activity Tolerance  Patient tolerated treatment well    Behavior During Therapy  [facility] for tasks assessed/performed       Past Medical History:  Diagnosis Date  . Bipolar disorder ([place])   . 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 Depression   . GERD (gastroesophageal reflux disease)   . Headache   . Hepatitis B    Hepatitis E. antigen positive by history, status post treatment with Hepsera and baraclude   . 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 Personal history of colonic polyps    Adenomas polyp 2008 and 2010, 2015   . Weight loss    Pt. has lost 10 lbs since pre-visit, intentionally with diet and walking    Past Surgical History:  Procedure Laterality Date  . APPENDECTOMY    . COLONOSCOPY    . POLYPECTOMY    . UPPER GASTROINTESTINAL ENDOSCOPY      There were no vitals filed for this visit.  Subjective Assessment - 11/15/17 1502    Subjective  No new complaints.  Feels standing on one leg is challanging.    Diagnostic tests  10/25/17 NCV with EMG of B LE: Chronic L5 radiculopathy affecting the right lower extremity, mild in degree electrically. (Affecting R extensor digitorum, anterior tibialis, glute med)  There is no evidence of a large fiber sensorimotor polyneuropathy affecting the lower extremities.    Patient Stated Goals  get better balance    Currently in Pain?  No/denies    Multiple Pain Sites  No                        [facility] Adult PT Treatment/Exercise - 11/15/17 1506      High Level Balance   High Level Balance Activities  Backward walking;Head turns;Tandem walking;Marching forwards    High Level Balance Comments  forward walks with lateral head turns and with large deviations from path with conversation       Self-Care   Self-Care  --      Neuro Re-ed    Neuro Re-ed Details   with therapist supervision/CGA on airex pad foam: narrow stance with horizontal head turns 2 x 30 sec (difficulty), vertical head turns 2 x 30 sec (difficulty), SLS on foam 3 x 10 sec (need for 1 UE assistance on chair), SLS on firm surface 2 x 20 sec without UE support (difficulty), tandem stance with 1 UE support on firm surface x 30 sec each way       Knee/Hip Exercises: Aerobic   Recumbent Bike  Lvl 2, 7 min       Knee/Hip Exercises: Supine   Bridges  Both;10 reps;2 sets    Bridges Limitations  Some cueing for proper motion     Other Supine  Knee/Hip Exercises  Alternating hip flexion march with yellow TB 3" x 15 reps each way                PT Short Term Goals - 11/13/17 1541      PT SHORT TERM GOAL #1   Title  Patient to be independent with initial HEP.    Time  3    Period  Weeks    Status  On-going        PT Long Term Goals - 11/13/17 1541      PT LONG TERM GOAL #1   Title  Patient to be independent with advanced HEP.    Time  6    Period  Weeks    Status  On-going      PT LONG TERM GOAL #2   Title  Patient to demonstrate 4+/5 strength in B LEs.    Time  6    Period  Weeks    Status  On-going      PT LONG TERM GOAL #3   Title  Patient to demonstrate SLS 10 sec on each LE without LOB.    Time  6    Period  Weeks    Status  On-going      PT LONG TERM GOAL #4   Title  Patient to demonstrate mild sway and no LOB with M-CTSIB EC/foam surface.    Time  6    Period  Weeks    Status  On-going      PT LONG TERM GOAL #5   Title  Patient to demonstrate  reciprocal stair climbing without handrail and with good eccentric control throughout without evidence of instability.    Time  6    Period  Weeks    Status  On-going            Plan - 11/15/17 1502    Clinical Impression Statement  Angel [name] doing well today.  Session focused on dynamic gait activities with head turns and static balance activities on compliant surface.  Pt. with most difficulty maintaining straight-line path with forward walk with head turns and conversation requiring therapist cueing to avoid running into objects.  Angel [name] tolerated all activities in session well however does require close supervision/CGA at times with standing activities for general safety.  Will continue to progress toward goals.      PT Treatment/Interventions  ADLs/Self Care Home Management;Cryotherapy;Electrical Stimulation;Moist Heat;Ultrasound;DME Instruction;Gait training;Stair training;Functional mobility training;Therapeutic activities;Therapeutic exercise;Manual techniques;Orthotic Fit/Training;Patient/family education;Neuromuscular re-education;Balance training;Passive range of motion;Dry needling;Energy conservation;Splinting;Taping;Vasopneumatic Device       Patient will benefit from skilled therapeutic intervention in order to improve the following deficits and impairments:  Abnormal gait, Decreased strength, Pain, Decreased balance, Decreased mobility, Difficulty walking, Improper body mechanics, Postural dysfunction  Visit Diagnosis: Unsteadiness on feet  Other abnormalities of gait and mobility  Muscle weakness (generalized)     Problem List Patient Active Problem List   Diagnosis Date Noted  . Hyperglycemia 10/07/2013  . Hepatic cirrhosis ([place]) 10/03/2013  . Bipolar I disorder, most recent episode (or current) depressed, unspecified 05/21/2013  . Hepatitis B 03/21/2013  . Esophageal reflux 03/21/2013  . Personal history of colonic polyps 03/21/2013  . Bipolar disorder,  unspecified ([place]) 09/03/2011    Bess Harvest, PTA 11/15/17 4:45 PM   [facility] [address]  [place], [place], [address] Phone: [phone]   Fax:  [phone]  Name: Angel [name] MRN: [id number] Date of Birth:  06/23/1955   

## 2017-11-16 ENCOUNTER — Encounter: Payer: Self-pay | Admitting: Medical

## 2017-11-20 ENCOUNTER — Ambulatory Visit: Payer: BLUE CROSS/BLUE SHIELD

## 2017-11-20 DIAGNOSIS — M6281 Muscle weakness (generalized): Secondary | ICD-10-CM

## 2017-11-20 DIAGNOSIS — R2681 Unsteadiness on feet: Secondary | ICD-10-CM

## 2017-11-20 DIAGNOSIS — R2689 Other abnormalities of gait and mobility: Secondary | ICD-10-CM | POA: Diagnosis not present

## 2017-11-20 NOTE — Therapy (Signed)
[facility] [address]  [place], [place], [address] Phone: [phone]   Fax:  [phone]  Physical Therapy Treatment  Patient Details  Name: Angel [name] MRN: [id number] Date of Birth: 06-10-1955 Referring Provider: Metta Clines, DO   Encounter Date: 11/20/2017  PT End of Session - 11/20/17 1537    Visit Number  4    Number of Visits  13    Date for PT Re-Evaluation  12/17/17    Authorization Type  BCBS    PT Start Time  1529    PT Stop Time  1610    PT Time Calculation (min)  41 min    Activity Tolerance  Patient tolerated treatment well    Behavior During Therapy  [facility] for tasks assessed/performed       Past Medical History:  Diagnosis Date  . Bipolar disorder ([place])   . 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 Depression   . GERD (gastroesophageal reflux disease)   . Headache   . Hepatitis B    Hepatitis E. antigen positive by history, status post treatment with Hepsera and baraclude   . Hepatitis B carrier ([place])   . Personal history of colonic polyps    Adenomas polyp 2008 and 2010, 2015   . Weight loss    Pt. has lost 10 lbs since pre-visit, intentionally with diet and walking    Past Surgical History:  Procedure Laterality Date  . APPENDECTOMY    . COLONOSCOPY    . POLYPECTOMY    . UPPER GASTROINTESTINAL ENDOSCOPY      There were no vitals filed for this visit.  Subjective Assessment - 11/20/17 1535    Subjective  Pt. noting he is doing well however still with occasional neck pain.  With steroid shot scheduled for neck 7.30.19.    Diagnostic tests  10/25/17 NCV with EMG of B LE: Chronic L5 radiculopathy affecting the right lower extremity, mild in degree electrically. (Affecting R extensor digitorum, anterior tibialis, glute med)  There is no evidence of a large fiber sensorimotor polyneuropathy affecting the lower extremities.    Patient Stated Goals  get better balance    Currently in Pain?  Yes    Pain Score  3     Pain Location  Neck    Pain Orientation  Right    Pain Type  Acute pain    Pain Onset  More than a month ago    Pain Frequency  Intermittent    Aggravating Factors   rotation head in car                        [facility] Adult PT Treatment/Exercise - 11/20/17 1552      Neuro Re-ed    Neuro Re-ed Details   Side stepping on foam balance beam x 5 laps; alternating cone nock over/righting with CGA 2 x 7 reps; R/L, vertical head turns standing in rhomberg position on airex pad with CGA x 30 sec each way       Knee/Hip Exercises: Aerobic   Recumbent Bike  Lvl 3, 7 min       Knee/Hip Exercises: Standing   Heel Raises  Both;15 reps    Heel Raises Limitations  on airex pad     Other Standing Knee Exercises  Alternating toe-clears with 2# at ankles to 9" stool standing on airex pad x 15 rpes each way; CGA from therapist  Knee/Hip Exercises: Seated   Sit to Sand  10 reps;with UE support mat table to airex pad       Knee/Hip Exercises: Supine   Bridges  15 reps;Both;Strengthening               PT Short Term Goals - 11/20/17 1555      PT SHORT TERM GOAL #1   Title  Patient to be independent with initial HEP.    Time  3    Period  Weeks    Status  Achieved        PT Long Term Goals - 11/13/17 1541      PT LONG TERM GOAL #1   Title  Patient to be independent with advanced HEP.    Time  6    Period  Weeks    Status  On-going      PT LONG TERM GOAL #2   Title  Patient to demonstrate 4+/5 strength in B LEs.    Time  6    Period  Weeks    Status  On-going      PT LONG TERM GOAL #3   Title  Patient to demonstrate SLS 10 sec on each LE without LOB.    Time  6    Period  Weeks    Status  On-going      PT LONG TERM GOAL #4   Title  Patient to demonstrate mild sway and no LOB with M-CTSIB EC/foam surface.    Time  6    Period  Weeks    Status  On-going      PT LONG TERM GOAL #5   Title  Patient to  demonstrate reciprocal stair climbing without handrail and with good eccentric control throughout without evidence of instability.    Time  6    Period  Weeks    Status  On-going            Plan - 11/20/17 1552    Clinical Impression Statement  Adib progressing well with balance and proprioception training today however still with most difficulty maintaining balance with vertical head turns and eyes closed balance on compliant surface today requiring CGA/supervision from therapist.  Will continue to progress toward goals.       PT Treatment/Interventions  ADLs/Self Care Home Management;Cryotherapy;Electrical Stimulation;Moist Heat;Ultrasound;DME Instruction;Gait training;Stair training;Functional mobility training;Therapeutic activities;Therapeutic exercise;Manual techniques;Orthotic Fit/Training;Patient/family education;Neuromuscular re-education;Balance training;Passive range of motion;Dry needling;Energy conservation;Splinting;Taping;Vasopneumatic Device    Consulted and Agree with Plan of Care  Patient       Patient will benefit from skilled therapeutic intervention in order to improve the following deficits and impairments:  Abnormal gait, Decreased strength, Pain, Decreased balance, Decreased mobility, Difficulty walking, Improper body mechanics, Postural dysfunction  Visit Diagnosis: Unsteadiness on feet  Other abnormalities of gait and mobility  Muscle weakness (generalized)     Problem List Patient Active Problem List   Diagnosis Date Noted  . Hyperglycemia 10/07/2013  . Hepatic cirrhosis ([place]) 10/03/2013  . Bipolar I disorder, most recent episode (or current) depressed, unspecified 05/21/2013  . Hepatitis B 03/21/2013  . Esophageal reflux 03/21/2013  . Personal history of colonic polyps 03/21/2013  . Bipolar disorder, unspecified ([place]) 09/03/2011    Bess Harvest, PTA 11/20/17 6:20 PM  [facility] [address]  [place], [place], [address] Phone: [phone]   Fax:  [phone]  Name: Mekhi Sonn MRN: [id number] Date of Birth: 09/13/1955

## 2017-11-22 ENCOUNTER — Ambulatory Visit: Payer: BLUE CROSS/BLUE SHIELD

## 2017-11-22 DIAGNOSIS — R2681 Unsteadiness on feet: Secondary | ICD-10-CM

## 2017-11-22 DIAGNOSIS — M6281 Muscle weakness (generalized): Secondary | ICD-10-CM

## 2017-11-22 DIAGNOSIS — R2689 Other abnormalities of gait and mobility: Secondary | ICD-10-CM

## 2017-11-22 NOTE — Therapy (Signed)
[facility] [address]  [place], [place], [address] Phone: [phone]   Fax:  [phone]  Physical Therapy Treatment  Patient Details  Name: Angel [name] MRN: [id number] Date of Birth: [birth year] Referring Provider: Metta Clines, DO   Encounter Date: 11/22/2017  PT End of Session - 11/22/17 1538    Visit Number  5    Number of Visits  13    Date for PT Re-Evaluation  12/17/17    Authorization Type  BCBS    PT Start Time  1530    PT Stop Time  1612    PT Time Calculation (min)  42 min    Activity Tolerance  Patient tolerated treatment well    Behavior During Therapy  [facility] for tasks assessed/performed       Past Medical History:  Diagnosis Date  . Bipolar disorder ([place])   . 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 Depression   . GERD (gastroesophageal reflux disease)   . Headache   . Hepatitis B    Hepatitis E. antigen positive by history, status post treatment with Hepsera and baraclude   . Hepatitis B carrier ([place])   . Personal history of colonic polyps    Adenomas polyp 2008 and 2010, 2015   . Weight loss    Pt. has lost 10 lbs since pre-visit, intentionally with diet and walking    Past Surgical History:  Procedure Laterality Date  . APPENDECTOMY    . COLONOSCOPY    . POLYPECTOMY    . UPPER GASTROINTESTINAL ENDOSCOPY      There were no vitals filed for this visit.  Subjective Assessment - 11/22/17 1534    Subjective  Pt. asking about additional HEP today.      Diagnostic tests  10/25/17 NCV with EMG of B LE: Chronic L5 radiculopathy affecting the right lower extremity, mild in degree electrically. (Affecting R extensor digitorum, anterior tibialis, glute med)  There is no evidence of a large fiber sensorimotor polyneuropathy affecting the lower extremities.    Patient Stated Goals  get better balance    Currently in Pain?  No/denies    Pain Score  0-No pain    Multiple Pain Sites   No                       [facility] Adult PT Treatment/Exercise - 11/22/17 1542      Neuro Re-ed    Neuro Re-ed Details   Corner balance on airex pad: Rhombergs position standard stance x 30 sec (no sway); Narrow stance in rhomberg position x 30 sec; Standard stance with eyes closed and horizontal, vertical head turns x 30 sec each way (moderate sway); Staggered stance in rhomberg position with verticla/horizontal head turns (mild sway) x 30 sec each       Knee/Hip Exercises: Aerobic   Recumbent Bike  Lvl 3, 7 min       Knee/Hip Exercises: Standing   Hip Flexion  Right;Left;10 reps;Knee straight;Stengthening    Hip Flexion Limitations  red TB at ankle; 2 ski poles     Hip ADduction  Right;Left;10 reps;Strengthening    Hip ADduction Limitations  red TB at ankle; 2 ski poles     Hip Abduction  Right;Left;10 reps;Knee straight;Stengthening    Abduction Limitations  Red TB at ankle; 2 ski poles     Hip Extension  Right;Left;10 reps;Knee straight;Stengthening    Extension Limitations  red  TB at ankles; 2 ski poles     Other Standing Knee Exercises  Alternating 9" stool step up with opposite hip flexion (bent knee) 2# at ankles (gait belt) CGA x 10 each way              PT Education - 11/22/17 1614    Education Details  HEP update     Person(s) Educated  Patient    Methods  Explanation;Demonstration;Verbal cues;Handout    Comprehension  Verbalized understanding;Returned demonstration;Need further instruction;Verbal cues required       PT Short Term Goals - 11/20/17 1555      PT SHORT TERM GOAL #1   Title  Patient to be independent with initial HEP.    Time  3    Period  Weeks    Status  Achieved        PT Long Term Goals - 11/13/17 1541      PT LONG TERM GOAL #1   Title  Patient to be independent with advanced HEP.    Time  6    Period  Weeks    Status  On-going      PT LONG TERM GOAL #2   Title  Patient to demonstrate 4+/5 strength in B LEs.    Time  6     Period  Weeks    Status  On-going      PT LONG TERM GOAL #3   Title  Patient to demonstrate SLS 10 sec on each LE without LOB.    Time  6    Period  Weeks    Status  On-going      PT LONG TERM GOAL #4   Title  Patient to demonstrate mild sway and no LOB with M-CTSIB EC/foam surface.    Time  6    Period  Weeks    Status  On-going      PT LONG TERM GOAL #5   Title  Patient to demonstrate reciprocal stair climbing without handrail and with good eccentric control throughout without evidence of instability.    Time  6    Period  Weeks    Status  On-going            Plan - 11/22/17 1538    Clinical Impression Statement  Kanon doing well today with no new complaints.  Added corner balance activities on foam today with pt. tolerating well however most difficulty with eyes closed narrow stance in Romberg positioning.  Requires consistent cueing with standing balance activities for proper pacing.  Will continue to progress toward goals.      PT Treatment/Interventions  ADLs/Self Care Home Management;Cryotherapy;Electrical Stimulation;Moist Heat;Ultrasound;DME Instruction;Gait training;Stair training;Functional mobility training;Therapeutic activities;Therapeutic exercise;Manual techniques;Orthotic Fit/Training;Patient/family education;Neuromuscular re-education;Balance training;Passive range of motion;Dry needling;Energy conservation;Splinting;Taping;Vasopneumatic Device    Consulted and Agree with Plan of Care  Patient       Patient will benefit from skilled therapeutic intervention in order to improve the following deficits and impairments:  Abnormal gait, Decreased strength, Pain, Decreased balance, Decreased mobility, Difficulty walking, Improper body mechanics, Postural dysfunction  Visit Diagnosis: Unsteadiness on feet  Other abnormalities of gait and mobility  Muscle weakness (generalized)     Problem List Patient Active Problem List   Diagnosis Date Noted  .  Hyperglycemia 10/07/2013  . Hepatic cirrhosis ([place]) 10/03/2013  . Bipolar I disorder, most recent episode (or current) depressed, unspecified 05/21/2013  . Hepatitis B 03/21/2013  . Esophageal reflux 03/21/2013  . Personal history of colonic polyps 03/21/2013  .  Bipolar disorder, unspecified ([place]) 09/03/2011    Bess Harvest, PTA 11/22/17 6:09 PM   [facility] [address]  [place], [place], [address] Phone: [phone]   Fax:  [phone]  Name: Ebb Carelock MRN: [id number] Date of Birth: 04-06-1956

## 2017-11-27 ENCOUNTER — Encounter: Payer: Self-pay | Admitting: Physical Therapy

## 2017-11-27 ENCOUNTER — Ambulatory Visit: Payer: BLUE CROSS/BLUE SHIELD | Admitting: Physical Therapy

## 2017-11-27 DIAGNOSIS — M6281 Muscle weakness (generalized): Secondary | ICD-10-CM | POA: Diagnosis not present

## 2017-11-27 DIAGNOSIS — R2689 Other abnormalities of gait and mobility: Secondary | ICD-10-CM | POA: Diagnosis not present

## 2017-11-27 DIAGNOSIS — R2681 Unsteadiness on feet: Secondary | ICD-10-CM

## 2017-11-27 NOTE — Therapy (Signed)
[facility] [address]  [place], [place], [address] Phone: [phone]   Fax:  [phone]  Physical Therapy Treatment  Patient Details  Name: Angel [name] MRN: [id number] Date of Birth: 1956-03-28 Referring Provider: Metta Clines, DO   Encounter Date: 11/27/2017  PT End of Session - 11/27/17 1756    Visit Number  6    Number of Visits  13    Date for PT Re-Evaluation  12/17/17    Authorization Type  BCBS    PT Start Time  1530    PT Stop Time  1610    PT Time Calculation (min)  40 min    Equipment Utilized During Treatment  Gait belt    Activity Tolerance  Patient tolerated treatment well    Behavior During Therapy  [facility] for tasks assessed/performed       Past Medical History:  Diagnosis Date  . Bipolar disorder ([place])   . 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 Depression   . GERD (gastroesophageal reflux disease)   . Headache   . Hepatitis B    Hepatitis E. antigen positive by history, status post treatment with Hepsera and baraclude   . Hepatitis B carrier ([place])   . Personal history of colonic polyps    Adenomas polyp 2008 and 2010, 2015   . Weight loss    Pt. has lost 10 lbs since pre-visit, intentionally with diet and walking    Past Surgical History:  Procedure Laterality Date  . APPENDECTOMY    . COLONOSCOPY    . POLYPECTOMY    . UPPER GASTROINTESTINAL ENDOSCOPY      There were no vitals filed for this visit.  Subjective Assessment - 11/27/17 1531    Subjective  Reports things are going pretty good. Denies falls since last session. Reports he has been taking muscle relaxer for neck pain, will be getting steroid injections in neck soon. Hurts the most when turning head while driving.     Diagnostic tests  10/25/17 NCV with EMG of B LE: Chronic L5 radiculopathy affecting the right lower extremity, mild in degree electrically. (Affecting R extensor digitorum, anterior tibialis, glute  med)  There is no evidence of a large fiber sensorimotor polyneuropathy affecting the lower extremities.    Patient Stated Goals  get better balance    Currently in Pain?  No/denies                       [facility] Adult PT Treatment/Exercise - 11/27/17 0001      Neuro Re-ed    Neuro Re-ed Details   Standing feet together on foam with perturbations; 2 x 1 min      Knee/Hip Exercises: Stretches   Piriformis Stretch  Right;Left;1 rep;20 seconds;Limitations    Piriformis Stretch Limitations  sitting      Knee/Hip Exercises: Aerobic   Recumbent Bike  L3 x 6 min      Knee/Hip Exercises: Standing   Forward Step Up  Right;Left;1 set;10 reps;Hand Hold: 0;Step Height: 6";Limitations    Forward Step Up Limitations  CGA; VCs for incresaed DF and hip/knee flexion for improved threshold clearance    SLS  SLS with isometric ER 3x10" on each LE CGA/min A for balance    Gait Training  grapevine 2x66ft; walking with math problems and bouncing ball/throwing ball x 458ft with CGA good carryover after consistent cues given    Other Standing Knee  Exercises  sidestepping red TB around toes; 2x13ft VCs to correct path and avoid lateral trunk lean    Other Standing Knee Exercises  Stepping forward with toes of dynadisc; 10x each mild instabiliy; no LOB      Knee/Hip Exercises: Seated   Sit to Sand  10 reps;without UE support on foam               PT Short Term Goals - 11/20/17 1555      PT SHORT TERM GOAL #1   Title  Patient to be independent with initial HEP.    Time  3    Period  Weeks    Status  Achieved        PT Long Term Goals - 11/13/17 1541      PT LONG TERM GOAL #1   Title  Patient to be independent with advanced HEP.    Time  6    Period  Weeks    Status  On-going      PT LONG TERM GOAL #2   Title  Patient to demonstrate 4+/5 strength in B LEs.    Time  6    Period  Weeks    Status  On-going      PT LONG TERM GOAL #3   Title  Patient to demonstrate SLS 10  sec on each LE without LOB.    Time  6    Period  Weeks    Status  On-going      PT LONG TERM GOAL #4   Title  Patient to demonstrate mild sway and no LOB with M-CTSIB EC/foam surface.    Time  6    Period  Weeks    Status  On-going      PT LONG TERM GOAL #5   Title  Patient to demonstrate reciprocal stair climbing without handrail and with good eccentric control throughout without evidence of instability.    Time  6    Period  Weeks    Status  On-going            Plan - 11/27/17 1756    Clinical Impression Statement  Patient arrived to session with no new complaints; denied falls since last session. Patient tolerated static and dynamic balance training on foam with good tolerance- CGA provided for balance. Performed dual task gait training with cognitive challenge plus throwing/bouncing ball with CGA. Fairly steady throughout and with good balance demonstrating when reaching forward out of base of support to catch ball. Performing anterior step ups with CGA/min A for balance; VCs required to promote B full ankle DF and hip/knee flexion to ensure clearing threshold. Good effort with this activity but still with fair-poor eccentric control with ascending and descending motions. Patient with open-mouth breathing throughout activity but denied SOB when questioned. Monitored symptoms throughout but patient apparently asymptomatic.     PT Treatment/Interventions  ADLs/Self Care Home Management;Cryotherapy;Electrical Stimulation;Moist Heat;Ultrasound;DME Instruction;Gait training;Stair training;Functional mobility training;Therapeutic activities;Therapeutic exercise;Manual techniques;Orthotic Fit/Training;Patient/family education;Neuromuscular re-education;Balance training;Passive range of motion;Dry needling;Energy conservation;Splinting;Taping;Vasopneumatic Device    PT Next Visit Plan  continue with anterior step ups    Consulted and Agree with Plan of Care  Patient       Patient will  benefit from skilled therapeutic intervention in order to improve the following deficits and impairments:  Abnormal gait, Decreased strength, Pain, Decreased balance, Decreased mobility, Difficulty walking, Improper body mechanics, Postural dysfunction  Visit Diagnosis: Unsteadiness on feet  Other abnormalities of gait and mobility  Muscle  weakness (generalized)     Problem List Patient Active Problem List   Diagnosis Date Noted  . Hyperglycemia 10/07/2013  . Hepatic cirrhosis ([place]) 10/03/2013  . Bipolar I disorder, most recent episode (or current) depressed, unspecified 05/21/2013  . Hepatitis B 03/21/2013  . Esophageal reflux 03/21/2013  . Personal history of colonic polyps 03/21/2013  . Bipolar disorder, unspecified ([place]) 09/03/2011    Janene Harvey, PT, DPT 11/27/17 5:58 PM   [facility] [address]  [place], [place], [address] Phone: [phone]   Fax:  [phone]  Name: Esdras Delair MRN: [id number] Date of Birth: 06-18-1955

## 2017-11-29 ENCOUNTER — Ambulatory Visit: Payer: BLUE CROSS/BLUE SHIELD

## 2017-11-29 DIAGNOSIS — R2681 Unsteadiness on feet: Secondary | ICD-10-CM

## 2017-11-29 DIAGNOSIS — M6281 Muscle weakness (generalized): Secondary | ICD-10-CM

## 2017-11-29 DIAGNOSIS — R2689 Other abnormalities of gait and mobility: Secondary | ICD-10-CM
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
## 2017-11-30 ENCOUNTER — Encounter: Payer: Self-pay | Admitting: Medical

## 2017-12-03 ENCOUNTER — Telehealth: Payer: Self-pay | Admitting: Medical

## 2017-12-03 NOTE — Telephone Encounter (Signed)
I talked with Angel [name] today. Ok'd by [name] per my chart. He is scheduled for epidural injection tomorrow. We talked about potential side effects of epidural. I reviewed his labs and meds.  I informed him that I thought would be safe with minimal risk vs benefit. I asked if they have have any problems or need to contact me just my chart me or call.

## 2017-12-04 ENCOUNTER — Ambulatory Visit: Payer: BLUE CROSS/BLUE SHIELD

## 2017-12-04 ENCOUNTER — Ambulatory Visit (INDEPENDENT_AMBULATORY_CARE_PROVIDER_SITE_OTHER): Payer: BLUE CROSS/BLUE SHIELD | Admitting: Physical Medicine and Rehabilitation

## 2017-12-04 DIAGNOSIS — M5412 Radiculopathy, cervical region: Secondary | ICD-10-CM | POA: Diagnosis not present

## 2017-12-04 DIAGNOSIS — M542 Cervicalgia: Secondary | ICD-10-CM

## 2017-12-04 MED ORDER — TEMAZEPAM 15 MG PO CAPS: ORAL_CAPSULE | ORAL | 0 refills | Status: DC

## 2017-12-04 NOTE — Progress Notes (Signed)
  Numeric Pain Rating Scale and Functional Assessment Average Pain 5  Pain Right Now 4 My pain is on the right side mostly Pain is worse with: Driving, pulling out of parking space looking to left and right, can just turn around if someone behind him says something or if someone points out something while driving  Pain improves with: sloanpoas, emu ointment a little bit.   In the last MONTH (on 0-10 scale) has pain interfered with the following?  1. General activity like being  able to carry out your everyday physical activities such as walking, climbing stairs, carrying groceries, or moving a chair?  Rating 0  2. Relation with others like being able to carry out your usual social activities and roles such as  activities at home, at work and in your community. Rating 2  3. Enjoyment of life such that you have  been bothered by emotional problems such as feeling anxious, depressed or irritable?  Rating  5

## 2017-12-06 ENCOUNTER — Other Ambulatory Visit (HOSPITAL_COMMUNITY): Payer: Self-pay

## 2017-12-06 ENCOUNTER — Ambulatory Visit: Payer: BLUE CROSS/BLUE SHIELD | Attending: Neurology

## 2017-12-06 DIAGNOSIS — R2689 Other abnormalities of gait and mobility: Secondary | ICD-10-CM | POA: Insufficient documentation

## 2017-12-06 DIAGNOSIS — F331 Major depressive disorder, recurrent, moderate: Secondary | ICD-10-CM

## 2017-12-06 DIAGNOSIS — R2681 Unsteadiness on feet: Secondary | ICD-10-CM | POA: Insufficient documentation

## 2017-12-06 DIAGNOSIS — M6281 Muscle weakness (generalized): Secondary | ICD-10-CM | POA: Insufficient documentation

## 2017-12-06 MED ORDER — VENLAFAXINE HCL ER 150 MG PO CP24: 150.0000 mg | ORAL_CAPSULE | Freq: Every morning | ORAL | 0 refills | Status: DC

## 2017-12-10 ENCOUNTER — Ambulatory Visit (INDEPENDENT_AMBULATORY_CARE_PROVIDER_SITE_OTHER): Payer: BLUE CROSS/BLUE SHIELD | Admitting: Physical Medicine and Rehabilitation

## 2017-12-10 ENCOUNTER — Ambulatory Visit (INDEPENDENT_AMBULATORY_CARE_PROVIDER_SITE_OTHER): Payer: Self-pay

## 2017-12-10 ENCOUNTER — Encounter (INDEPENDENT_AMBULATORY_CARE_PROVIDER_SITE_OTHER): Payer: Self-pay | Admitting: Physical Medicine and Rehabilitation

## 2017-12-10 VITALS — BP 125/83 | HR 59

## 2017-12-10 DIAGNOSIS — M5412 Radiculopathy, cervical region: Secondary | ICD-10-CM

## 2017-12-10 MED ORDER — METHYLPREDNISOLONE ACETATE 80 MG/ML IJ SUSP
80.0000 mg | Freq: Once | INTRAMUSCULAR | Status: AC
  Administered 2017-12-10: 80 mg

## 2017-12-10 NOTE — Patient Instructions (Signed)

## 2017-12-10 NOTE — Progress Notes (Signed)
 .  Numeric Pain Rating Scale and Functional Assessment Average Pain 5   In the last MONTH (on 0-10 scale) has pain interfered with the following?  1. General activity like being  able to carry out your everyday physical activities such as walking, climbing stairs, carrying groceries, or moving a chair?  Rating(6)   +Driver, -BT, -Dye Allergies.  

## 2017-12-11 ENCOUNTER — Encounter (INDEPENDENT_AMBULATORY_CARE_PROVIDER_SITE_OTHER): Payer: Self-pay | Admitting: Physical Medicine and Rehabilitation

## 2017-12-11 ENCOUNTER — Ambulatory Visit: Payer: BLUE CROSS/BLUE SHIELD | Admitting: Physical Therapy

## 2017-12-11 NOTE — Progress Notes (Signed)
Angel [name] - 61 y.o. male MRN [id number]  Date of birth: [birth year]  Office Visit Note: Visit Date: 12/04/2017 PCP: [name], PA-C Referred by: [name], PA-C  Subjective: Chief Complaint  Patient presents with  . Neck - Pain   HPI: Mr. Zatarain is a 62 year old right-hand-dominant gentleman who comes in today at the request of Dr. Eduard Roux for evaluation and possible intervention for cervical neck pain which is chronic and worsening with MRI evidence of facet arthropathy and degenerative changes and stenosis.  Patient has been having neck pain worsening with cervicogenic type headache for several months.  He  is followed by Dr. Metta Clines at [facility] neurology who referred him to Dr. Eduard Roux for evaluation of his cervical spine.  The patient has undergone electrodiagnostic studies of the lower limbs and he has had evaluation of his neck with CT scan and now MRI.  Patient's case is complicated by bipolar disorder in terms of his medication uses which include Tegretol as well as lithium and Effexor.  He does not complain of any pain down the arms or numbness or tingling in the hands.  He has mainly pain on the right side of the neck with occasional pain into the mid back.  Muscle relaxer has been helping with the migraine headache and the cervicogenic headache.  He reports that he had never really had headaches like this before before he started seeing Dr. Tomi Likens.  Muscle relaxer tends to make him very sleepy however.  He does get some relief with laying down asked to lay down to watch TV.  He reports forward bending seems to make things worse.  He also reports difficulty driving with trying to look over the shoulder left and right.  He has been using salon pas patches as well as emu ointment and they have helped a little bit.  He reports it has not really affected his total daily living as much but it is a nuisance to have this much neck pain.  He rates his pain as a 5 out of 10.   He does not endorse any specific injury.  MRI of the cervical spine was obtained and this is reviewed below and reviewed with the patient today using spine models and the pictures.   Review of Systems  Constitutional: Negative for chills, fever, malaise/fatigue and weight loss.  HENT: Negative for hearing loss and sinus pain.   Eyes: Negative for blurred vision, double vision and photophobia.  Respiratory: Negative for cough and shortness of breath.   Cardiovascular: Negative for chest pain, palpitations and leg swelling.  Gastrointestinal: Negative for abdominal pain, nausea and vomiting.  Genitourinary: Negative for flank pain.  Musculoskeletal: Positive for neck pain. Negative for myalgias.  Skin: Negative for itching and rash.  Neurological: Positive for headaches. Negative for tremors, focal weakness and weakness.  Endo/Heme/Allergies: Negative.   Psychiatric/Behavioral: Negative for depression.  All other systems reviewed and are negative.  Otherwise per HPI.  Assessment & Plan: Visit Diagnoses:  1. Cervicalgia   2. Cervical radiculopathy     Plan: Findings:  Chronic worsening several month history of increasing neck pain particularly on the right with associated increase in intensity of headaches.  Patient is followed by neurology and now has been evaluated by Dr. Eduard Roux who kindly send this to [place] for evaluation and possible intervention.  I do feel like the patient's neck pain is multifactorial I think he does have some myofascial pain but he also has clear facet  arthropathy at C3 4-5 C5-6 and C6-7 with some narrowing of the canal and foramen bilaterally.  He is not having really radicular signs down the arms but he does get pain into the right trapezius area and scapula.  This could be more of a C5 or C6 nerve root irritation or possibly been C4.  He does get some pain with rotation so I think he gets some pain from the arthritis.  The right C2-3 joint seems to be fused on the  MRI but again could be a source of cervicogenic headache.  I think the best approach is interlaminar epidural steroid injection diagnostically hopefully therapeutically just to see how much relief he gets.  Depending on that relief would encourage him to look at physical therapy with dry needling.  And lastly could look at facet joint injection diagnostically with a goal towards radiofrequency ablation if it helped.  These were all discussed at length with the patient.  He does want to proceed with the epidural injection.  We did prescribe temazepam preprocedure just from a oral sedation standpoint.  All the patient's questions were answered.  He will continue to see Dr. Erlinda Hong for his orthopedic complaints.  He will continue to follow with Dr. Tomi Likens from neurology standpoint.    Meds & Orders:  Meds ordered this encounter  Medications  . temazepam (RESTORIL) 15 MG capsule    Sig: Take 1 cap by mouth 1 hour prior to procedure on empty stomach.    Dispense:  2 capsule    Refill:  0   No orders of the defined types were placed in this encounter.   Follow-up: Return for C7-T1 interlaminar epidural steroid injection..   Procedures: No procedures performed  No notes on file   Clinical History: MRI CERVICAL SPINE WITHOUT CONTRAST  TECHNIQUE: Multiplanar, multisequence MR imaging of the cervical spine was performed. No intravenous contrast was administered.  COMPARISON:  None available.  FINDINGS: Alignment: Straightening of the normal cervical lordosis. No listhesis.  Vertebrae: Vertebral body heights are maintained without evidence for acute or chronic fracture. Bone marrow signal intensity within normal limits. Benign hemangioma noted within the T1 vertebral body. Prominent reactive endplate changes present about the C5-6 interspace. No worrisome osseous lesions.  Cord: Signal intensity within the cervical spinal cord is normal.  Posterior Fossa, vertebral arteries, paraspinal  tissues: Visualized brain and posterior fossa within normal limits. Craniocervical junction normal. Paraspinous and prevertebral soft tissues normal. Normal intravascular flow voids present within the vertebral arteries bilaterally.  Disc levels:  C2-C3: Mild uncovertebral hypertrophy. Right C2-3 facets appear fused. No significant stenosis.  C3-C4: Left eccentric disc bulge with left greater than right uncovertebral hypertrophy. Right greater than left facet hypertrophy. No significant spinal stenosis. Mild bilateral C4 foraminal narrowing.  C4-C5: Diffuse disc bulge with bilateral uncovertebral hypertrophy. Posterior disc osteophyte flattens the ventral thecal sac results in mild spinal stenosis. Moderate left with mild right C5 foraminal narrowing.  C5-C6: Chronic circumferential disc osteophyte complex with intervertebral disc space narrowing. Broad posterior component flattens the ventral thecal sac with mild spinal stenosis. Moderate to severe right with moderate left C6 foraminal narrowing.  C6-C7: Mild disc bulge with uncovertebral hypertrophy. No significant stenosis.  C7-T1:  Minimal facet hypertrophy on the left.  No stenosis.  Visualized upper thoracic spine within normal limits.  IMPRESSION: 1. Degenerative disc osteophyte at C4-5 and C5-6 with resultant mild spinal stenosis. 2. Multifactorial degenerative changes with resultant multilevel foraminal narrowing as above. Notable findings include moderate left  C5 foraminal stenosis, with moderate to severe right and moderate left C6 foraminal narrowing.   Electronically Signed   By: Jeannine Boga M.D.   On: 10/28/2017 06:12   He reports that he quit smoking about 8 years ago. His smoking use included cigarettes. He has never used smokeless tobacco. No results for input(s): HGBA1C, LABURIC in the last 8760 hours.  Objective:  VS:  HT:    WT:   BMI:     BP:   HR: bpm  TEMP: ( )  RESP:    Physical Exam  Constitutional: He is oriented to person, place, and time. He appears well-developed and well-nourished. No distress.  HENT:  Head: Normocephalic and atraumatic.  Nose: Nose normal.  Mouth/Throat: Oropharynx is clear and moist.  Eyes: Pupils are equal, round, and reactive to light. Conjunctivae are normal.  Neck: Neck supple. No JVD present. No tracheal deviation present.  Cardiovascular: Regular rhythm and intact distal pulses.  Pulmonary/Chest: Effort normal and breath sounds normal.  Abdominal: Soft. He exhibits no distension. There is no rebound and no guarding.  Musculoskeletal: He exhibits no deformity.  Patient sits with a forward flexed cervical spine they do have pain at end ranges of right rotation more than the left with some restricted movement.  There are trigger points noted in the paraspinal musculature trapezius and levator scapula.  The patient has little in the way of shoulder impingement all he has some pain at end ranges.  He has good range of motion of the shoulders.  He has 5 out of 5 upper body strength and symmetric bilaterally.  He has 2+ muscle stretch reflexes at the biceps and brachioradialis and a negative Hoffman's test bilaterally.  Lymphadenopathy:    He has no cervical adenopathy.  Neurological: He is alert and oriented to person, place, and time. He exhibits normal muscle tone. Coordination normal.  Skin: Skin is warm. No rash noted.  Psychiatric: He has a normal mood and affect. His behavior is normal.  Nursing note and vitals reviewed.   Ortho Exam Imaging: Xr C-arm No Report  Result Date: 12/10/2017 Please see Notes tab for imaging impression.   Past Medical/Family/Surgical/Social History: Medications & Allergies reviewed per EMR, new medications updated. Patient Active Problem List   Diagnosis Date Noted  . Hyperglycemia 10/07/2013  . Hepatic cirrhosis ([place]) 10/03/2013  . Bipolar I disorder, most recent episode (or current)  depressed, unspecified 05/21/2013  . Hepatitis B 03/21/2013  . Esophageal reflux 03/21/2013  . Personal history of colonic polyps 03/21/2013  . Bipolar disorder, unspecified ([place]) 09/03/2011   Past Medical History:  Diagnosis Date  . Bipolar disorder ([place])   . 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 Depression   . GERD (gastroesophageal reflux disease)   . Headache   . Hepatitis B    Hepatitis E. antigen positive by history, status post treatment with Hepsera and baraclude   . Hepatitis B carrier ([place])   . Personal history of colonic polyps    Adenomas polyp 2008 and 2010, 2015   . Weight loss    Pt. has lost 10 lbs since pre-visit, intentionally with diet and walking   Family History  Problem Relation Age of Onset  . Heart disease Mother   . Diabetes Father   . Kidney failure Father   . Kidney disease Father   . Other Sister        GERD  . Colon cancer Neg Hx   . Rectal cancer  Neg Hx   . Stomach cancer Neg Hx   . Esophageal cancer Neg Hx    Past Surgical History:  Procedure Laterality Date  . APPENDECTOMY    . COLONOSCOPY    . POLYPECTOMY    . UPPER GASTROINTESTINAL ENDOSCOPY     Social History   Occupational History  . Occupation: retired    Fish farm manager: NOT EMPLOYED  Tobacco Use  . Smoking status: Former Smoker    Types: Cigarettes    Last attempt to quit: 05/08/2009    Years since quitting: 8.6  . Smokeless tobacco: Never Used  Substance and Sexual Activity  . Alcohol use: No    Alcohol/week: 0.0 oz  . Drug use: No  . Sexual activity: Not Currently

## 2017-12-13 ENCOUNTER — Ambulatory Visit: Payer: BLUE CROSS/BLUE SHIELD

## 2017-12-13 DIAGNOSIS — M6281 Muscle weakness (generalized): Secondary | ICD-10-CM

## 2017-12-13 DIAGNOSIS — R2681 Unsteadiness on feet: Secondary | ICD-10-CM | POA: Diagnosis not present

## 2017-12-13 DIAGNOSIS — R2689 Other abnormalities of gait and mobility: Secondary | ICD-10-CM

## 2017-12-13 NOTE — Therapy (Signed)
[facility] [address]  [place], [place], [address] Phone: [phone]   Fax:  [phone]  Physical Therapy Treatment  Patient Details  Name: Angel [name] MRN: [id number] Date of Birth: 1955/07/30 Referring Provider: Metta Clines, DO   Encounter Date: 12/13/2017  PT End of Session - 12/13/17 1540    Visit Number  8    Number of Visits  13    Date for PT Re-Evaluation  12/17/17    Authorization Type  BCBS    PT Start Time  1532    PT Stop Time  1610    PT Time Calculation (min)  38 min    Equipment Utilized During Treatment  Gait belt    Activity Tolerance  Patient tolerated treatment well    Behavior During Therapy  [facility] for tasks assessed/performed       Past Medical History:  Diagnosis Date  . Bipolar disorder ([place])   . 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 Depression   . GERD (gastroesophageal reflux disease)   . Headache   . Hepatitis B    Hepatitis E. antigen positive by history, status post treatment with Hepsera and baraclude   . Hepatitis B carrier ([place])   . Personal history of colonic polyps    Adenomas polyp 2008 and 2010, 2015   . Weight loss    Pt. has lost 10 lbs since pre-visit, intentionally with diet and walking    Past Surgical History:  Procedure Laterality Date  . APPENDECTOMY    . COLONOSCOPY    . POLYPECTOMY    . UPPER GASTROINTESTINAL ENDOSCOPY      There were no vitals filed for this visit.  Subjective Assessment - 12/13/17 1540    Subjective  Pt. doing well today noting good benefit from "neck injection".      Diagnostic tests  10/25/17 NCV with EMG of B LE: Chronic L5 radiculopathy affecting the right lower extremity, mild in degree electrically. (Affecting R extensor digitorum, anterior tibialis, glute med)  There is no evidence of a large fiber sensorimotor polyneuropathy affecting the lower extremities.    Patient Stated Goals  get better balance    Currently in Pain?  No/denies    Pain Score  0-No pain    Multiple Pain Sites  No                       [facility] Adult PT Treatment/Exercise - 12/13/17 1543      Neuro Re-ed    Neuro Re-ed Details   Narrow stance eyes closed horizontal, vertical head turns x 30 sec on airex pad      Knee/Hip Exercises: Aerobic   Recumbent Bike  L3 x 7 min      Knee/Hip Exercises: Machines for Strengthening   Cybex Knee Flexion  B LE's: 35# x 15 resp     Cybex Leg Press  B LE's 35# x 20 reps       Knee/Hip Exercises: Standing   Heel Raises  Both;20 reps;2 sets    Heel Raises Limitations  at counter with 3" lowering     Forward Step Up  Right;Left;15 reps;Hand Hold: 0    Forward Step Up Limitations  9" stool with gait belt     Other Standing Knee Exercises  B fitter hip extension (1 blue, 1 black band) x 15 reps       Knee/Hip Exercises: Seated  Sit to Sand  without UE support;15 reps   from mat table to airex pad      Knee/Hip Exercises: Sidelying   Hip ABduction  Right;Left;Strengthening;15 reps               PT Short Term Goals - 11/20/17 1555      PT SHORT TERM GOAL #1   Title  Patient to be independent with initial HEP.    Time  3    Period  Weeks    Status  Achieved        PT Long Term Goals - 11/13/17 1541      PT LONG TERM GOAL #1   Title  Patient to be independent with advanced HEP.    Time  6    Period  Weeks    Status  On-going      PT LONG TERM GOAL #2   Title  Patient to demonstrate 4+/5 strength in B LEs.    Time  6    Period  Weeks    Status  On-going      PT LONG TERM GOAL #3   Title  Patient to demonstrate SLS 10 sec on each LE without LOB.    Time  6    Period  Weeks    Status  On-going      PT LONG TERM GOAL #4   Title  Patient to demonstrate mild sway and no LOB with M-CTSIB EC/foam surface.    Time  6    Period  Weeks    Status  On-going      PT LONG TERM GOAL #5   Title  Patient to demonstrate reciprocal stair climbing  without handrail and with good eccentric control throughout without evidence of instability.    Time  6    Period  Weeks    Status  On-going            Plan - 12/13/17 1541    Clinical Impression Statement  Angel [name] doing well today noting he felt good relief following recent injection in cervical spine.  Tolerated all balance and strengthening activities well today.  Continues to have most difficulty/instability with anterior step-ups and narrow stance/eyes closed/foam balance.  Progressing well.      PT Treatment/Interventions  ADLs/Self Care Home Management;Cryotherapy;Electrical Stimulation;Moist Heat;Ultrasound;DME Instruction;Gait training;Stair training;Functional mobility training;Therapeutic activities;Therapeutic exercise;Manual techniques;Orthotic Fit/Training;Patient/family education;Neuromuscular re-education;Balance training;Passive range of motion;Dry needling;Energy conservation;Splinting;Taping;Vasopneumatic Device    Consulted and Agree with Plan of Care  Patient       Patient will benefit from skilled therapeutic intervention in order to improve the following deficits and impairments:  Abnormal gait, Decreased strength, Pain, Decreased balance, Decreased mobility, Difficulty walking, Improper body mechanics, Postural dysfunction  Visit Diagnosis: Unsteadiness on feet  Other abnormalities of gait and mobility  Muscle weakness (generalized)     Problem List Patient Active Problem List   Diagnosis Date Noted  . Hyperglycemia 10/07/2013  . Hepatic cirrhosis ([place]) 10/03/2013  . Bipolar I disorder, most recent episode (or current) depressed, unspecified 05/21/2013  . Hepatitis B 03/21/2013  . Esophageal reflux 03/21/2013  . Personal history of colonic polyps 03/21/2013  . Bipolar disorder, unspecified ([place]) 09/03/2011    Bess Harvest, PTA 12/13/17 6:30 PM   [facility] [address]  [place], [place], [address] Phone: [phone]   Fax:  [phone]  Name: Angel [name] MRN: [id number] Date of Birth: July 17, 1955

## 2017-12-18 ENCOUNTER — Ambulatory Visit: Payer: BLUE CROSS/BLUE SHIELD

## 2017-12-18 DIAGNOSIS — R2689 Other abnormalities of gait and mobility: Secondary | ICD-10-CM | POA: Diagnosis not present

## 2017-12-18 DIAGNOSIS — M6281 Muscle weakness (generalized): Secondary | ICD-10-CM | POA: Diagnosis not present

## 2017-12-18 DIAGNOSIS — R2681 Unsteadiness on feet: Secondary | ICD-10-CM | POA: Diagnosis not present

## 2017-12-18 NOTE — Therapy (Signed)
[facility] [address]  [place], [place], [address] Phone: [phone]   Fax:  [phone]  Physical Therapy Treatment  Patient Details  Name: Angel [name] MRN: [id number] Date of Birth: [birth year] Referring Provider: Metta Clines, DO   Encounter Date: 12/18/2017  PT End of Session - 12/18/17 1537    Visit Number  9    Number of Visits  13    Date for PT Re-Evaluation  12/17/17    Authorization Type  BCBS    PT Start Time  1532    PT Stop Time  1615    PT Time Calculation (min)  43 min    Equipment Utilized During Treatment  --    Activity Tolerance  Patient tolerated treatment well    Behavior During Therapy  [facility] for tasks assessed/performed       Past Medical History:  Diagnosis Date  . Bipolar disorder ([place])   . 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 Depression   . GERD (gastroesophageal reflux disease)   . Headache   . Hepatitis B    Hepatitis E. antigen positive by history, status post treatment with Hepsera and baraclude   . Hepatitis B carrier ([place])   . Personal history of colonic polyps    Adenomas polyp 2008 and 2010, 2015   . Weight loss    Pt. has lost 10 lbs since pre-visit, intentionally with diet and walking    Past Surgical History:  Procedure Laterality Date  . APPENDECTOMY    . COLONOSCOPY    . POLYPECTOMY    . UPPER GASTROINTESTINAL ENDOSCOPY      There were no vitals filed for this visit.  Subjective Assessment - 12/18/17 1536    Subjective  Pt. doing well today and notes he needs to miss Thursday due to scheduling conflict.      Diagnostic tests  10/25/17 NCV with EMG of B LE: Chronic L5 radiculopathy affecting the right lower extremity, mild in degree electrically. (Affecting R extensor digitorum, anterior tibialis, glute med)  There is no evidence of a large fiber sensorimotor polyneuropathy affecting the lower extremities.    Patient Stated Goals  get better  balance    Currently in Pain?  No/denies    Pain Score  0-No pain    Multiple Pain Sites  No                       [facility] Adult PT Treatment/Exercise - 12/18/17 1543      Self-Care   Self-Care  Other Self-Care Comments    Other Self-Care Comments   Review of comprehensive HEP to check for understanding and update accordingly       Knee/Hip Exercises: Aerobic   Elliptical  Lvl 2, 7 min      Knee/Hip Exercises: Standing   Hip Flexion  Right;Left;Knee straight;Stengthening    Hip Flexion Limitations  red looped TB at ankles     Hip Abduction  Right;Left;Knee straight;Stengthening   x 12 reps    Abduction Limitations  red looped TB at ankles     Hip Extension  Right;Left;Knee straight;Stengthening   x 12 reps    Extension Limitations  red looped TB at ankles     Functional Squat  15 reps;3 seconds    Functional Squat Limitations  TRx    SLS  B SLS on pillow at counter 3 x 20 sec  Knee/Hip Exercises: Supine   Bridges  15 reps;Both;Strengthening             PT Education - 12/18/17 1656    Education Details  HEP update with red TB issued to pt.     Person(s) Educated  Patient    Methods  Explanation;Demonstration;Verbal cues;Handout    Comprehension  Verbalized understanding;Returned demonstration;Verbal cues required;Need further instruction       PT Short Term Goals - 11/20/17 1555      PT SHORT TERM GOAL #1   Title  Patient to be independent with initial HEP.    Time  3    Period  Weeks    Status  Achieved        PT Long Term Goals - 11/13/17 1541      PT LONG TERM GOAL #1   Title  Patient to be independent with advanced HEP.    Time  6    Period  Weeks    Status  On-going      PT LONG TERM GOAL #2   Title  Patient to demonstrate 4+/5 strength in B LEs.    Time  6    Period  Weeks    Status  On-going      PT LONG TERM GOAL #3   Title  Patient to demonstrate SLS 10 sec on each LE without LOB.    Time  6    Period  Weeks     Status  On-going      PT LONG TERM GOAL #4   Title  Patient to demonstrate mild sway and no LOB with M-CTSIB EC/foam surface.    Time  6    Period  Weeks    Status  On-going      PT LONG TERM GOAL #5   Title  Patient to demonstrate reciprocal stair climbing without handrail and with good eccentric control throughout without evidence of instability.    Time  6    Period  Weeks    Status  On-going            Plan - 12/18/17 1540    Clinical Impression Statement  Nakhi doing well today and notes he feels he will be ready to transition to home program following last visit.  Seems to have visible improvement in gait stability in clinic and notes improvement in walking and stair stability since starting therapy.  Reviewed comprehensive HEP and updated accordingly to target remaining balance deficits with progression of SLS activities.  Ended visit pain free.  Will plan to transition to home program with final goal testing and HEP review at upcoming visit.        PT Treatment/Interventions  ADLs/Self Care Home Management;Cryotherapy;Electrical Stimulation;Moist Heat;Ultrasound;DME Instruction;Gait training;Stair training;Functional mobility training;Therapeutic activities;Therapeutic exercise;Manual techniques;Orthotic Fit/Training;Patient/family education;Neuromuscular re-education;Balance training;Passive range of motion;Dry needling;Energy conservation;Splinting;Taping;Vasopneumatic Device    Consulted and Agree with Plan of Care  Patient       Patient will benefit from skilled therapeutic intervention in order to improve the following deficits and impairments:  Abnormal gait, Decreased strength, Pain, Decreased balance, Decreased mobility, Difficulty walking, Improper body mechanics, Postural dysfunction  Visit Diagnosis: Unsteadiness on feet  Other abnormalities of gait and mobility  Muscle weakness (generalized)     Problem List Patient Active Problem List   Diagnosis Date  Noted  . Hyperglycemia 10/07/2013  . Hepatic cirrhosis ([place]) 10/03/2013  . Bipolar I disorder, most recent episode (or current) depressed, unspecified 05/21/2013  . Hepatitis B 03/21/2013  .  Esophageal reflux 03/21/2013  . Personal history of colonic polyps 03/21/2013  . Bipolar disorder, unspecified ([place]) 09/03/2011    Bess Harvest, PTA 12/18/17 5:07 PM     [facility] [address]  [place], [place], [address] Phone: [phone]   Fax:  [phone]  Name: Icker Swigert MRN: [id number] Date of Birth: 06-24-1955

## 2017-12-20 ENCOUNTER — Ambulatory Visit: Payer: BLUE CROSS/BLUE SHIELD | Admitting: Physical Therapy

## 2017-12-25 ENCOUNTER — Encounter: Payer: Self-pay | Admitting: Medical

## 2017-12-25 ENCOUNTER — Other Ambulatory Visit: Payer: Self-pay | Admitting: Medical

## 2017-12-26 ENCOUNTER — Encounter: Payer: Self-pay | Admitting: Physical Therapy

## 2017-12-26 ENCOUNTER — Ambulatory Visit: Payer: BLUE CROSS/BLUE SHIELD | Admitting: Physical Therapy

## 2017-12-26 DIAGNOSIS — R2681 Unsteadiness on feet: Secondary | ICD-10-CM | POA: Diagnosis not present

## 2017-12-26 DIAGNOSIS — R2689 Other abnormalities of gait and mobility: Secondary | ICD-10-CM

## 2017-12-26 DIAGNOSIS — M6281 Muscle weakness (generalized): Secondary | ICD-10-CM | POA: Diagnosis not present

## 2017-12-26 NOTE — Progress Notes (Signed)
Elad Macphail - 62 y.o. male MRN [id number]  Date of birth: [birth year]  Office Visit Note: Visit Date: 12/10/2017 PCP: [name], PA-C Referred by: [name], PA-C  Subjective: Chief Complaint  Patient presents with  . Neck - Pain  . Right Shoulder - Pain   HPI: Stephanie is a 62 year old gentleman with chronic worsening neck and arm pain particularly on the right.  His pain will sometimes go into the right shoulder but really nothing more further down than that.  This all started in May 2019.  He reports turning his head seems to make things worse and the muscle relaxer seems to help.  Please see our prior evaluation note for further details and justification.  We are going to complete a right C7-T1 interlaminar epidural steroid injection.   ROS Otherwise per HPI.  Assessment & Plan: Visit Diagnoses:  1. Cervical radiculopathy     Plan: No additional findings.   Meds & Orders:  Meds ordered this encounter  Medications  . methylPREDNISolone acetate (DEPO-MEDROL) injection 80 mg    Orders Placed This Encounter  Procedures  . XR C-ARM NO REPORT  . Epidural Steroid injection    Follow-up: Return if symptoms worsen or fail to improve.   Procedures: No procedures performed  Cervical Epidural Steroid Injection - Interlaminar Approach with Fluoroscopic Guidance  Patient: Tanvir Hipple      Date of Birth: 1955-07-04 MRN: [id number] PCP: [name], PA-C      Visit Date: 12/10/2017   Universal Protocol:    Date/Time: 08/21/196:47 AM  Consent Given By: the patient  Position: PRONE  Additional Comments: Vital signs were monitored before and after the procedure. Patient was prepped and draped in the usual sterile fashion. The correct patient, procedure, and site was verified.   Injection Procedure Details:  Procedure Site One Meds Administered:  Meds ordered this encounter  Medications  . methylPREDNISolone acetate (DEPO-MEDROL) injection 80 mg      Laterality: Right  Location/Site: C7-T1  Needle size: 20 G  Needle type: Touhy  Needle Placement: Paramedian epidural space  Findings:  -Comments: Excellent flow of contrast into the epidural space.  Procedure Details: Using a paramedian approach from the side mentioned above, the region overlying the inferior lamina was localized under fluoroscopic visualization and the soft tissues overlying this structure were infiltrated with 4 ml. of 1% Lidocaine without Epinephrine. A # 20 gauge, Tuohy needle was inserted into the epidural space using a paramedian approach.  The epidural space was localized using loss of resistance along with lateral and contralateral oblique bi-planar fluoroscopic views.  After negative aspirate for air, blood, and CSF, a 2 ml. volume of Isovue-250 was injected into the epidural space and the flow of contrast was observed. Radiographs were obtained for documentation purposes.   The injectate was administered into the level noted above.  Additional Comments:  The patient tolerated the procedure well Dressing: Band-Aid    Post-procedure details: Patient was observed during the procedure. Post-procedure instructions were reviewed.  Patient left the clinic in stable condition.    Clinical History: MRI CERVICAL SPINE WITHOUT CONTRAST  TECHNIQUE: Multiplanar, multisequence MR imaging of the cervical spine was performed. No intravenous contrast was administered.  COMPARISON:  None available.  FINDINGS: Alignment: Straightening of the normal cervical lordosis. No listhesis.  Vertebrae: Vertebral body heights are maintained without evidence for acute or chronic fracture. Bone marrow signal intensity within normal limits. Benign hemangioma noted within the T1 vertebral body. Prominent reactive endplate changes present  about the C5-6 interspace. No worrisome osseous lesions.  Cord: Signal intensity within the cervical spinal cord is  normal.  Posterior Fossa, vertebral arteries, paraspinal tissues: Visualized brain and posterior fossa within normal limits. Craniocervical junction normal. Paraspinous and prevertebral soft tissues normal. Normal intravascular flow voids present within the vertebral arteries bilaterally.  Disc levels:  C2-C3: Mild uncovertebral hypertrophy. Right C2-3 facets appear fused. No significant stenosis.  C3-C4: Left eccentric disc bulge with left greater than right uncovertebral hypertrophy. Right greater than left facet hypertrophy. No significant spinal stenosis. Mild bilateral C4 foraminal narrowing.  C4-C5: Diffuse disc bulge with bilateral uncovertebral hypertrophy. Posterior disc osteophyte flattens the ventral thecal sac results in mild spinal stenosis. Moderate left with mild right C5 foraminal narrowing.  C5-C6: Chronic circumferential disc osteophyte complex with intervertebral disc space narrowing. Broad posterior component flattens the ventral thecal sac with mild spinal stenosis. Moderate to severe right with moderate left C6 foraminal narrowing.  C6-C7: Mild disc bulge with uncovertebral hypertrophy. No significant stenosis.  C7-T1:  Minimal facet hypertrophy on the left.  No stenosis.  Visualized upper thoracic spine within normal limits.  IMPRESSION: 1. Degenerative disc osteophyte at C4-5 and C5-6 with resultant mild spinal stenosis. 2. Multifactorial degenerative changes with resultant multilevel foraminal narrowing as above. Notable findings include moderate left C5 foraminal stenosis, with moderate to severe right and moderate left C6 foraminal narrowing.   Electronically Signed   By: Jeannine Boga M.D.   On: 10/28/2017 06:12   He reports that he quit smoking about 8 years ago. His smoking use included cigarettes. He has never used smokeless tobacco. No results for input(s): HGBA1C, LABURIC in the last 8760 hours.  Objective:  VS:   HT:    WT:   BMI:     BP:125/83  HR:(!) 59bpm  TEMP: ( )  RESP:  Physical Exam  Ortho Exam Imaging: No results found.  Past Medical/Family/Surgical/Social History: Medications & Allergies reviewed per EMR, new medications updated. Patient Active Problem List   Diagnosis Date Noted  . Hyperglycemia 10/07/2013  . Hepatic cirrhosis ([place]) 10/03/2013  . Bipolar I disorder, most recent episode (or current) depressed, unspecified 05/21/2013  . Hepatitis B 03/21/2013  . Esophageal reflux 03/21/2013  . Personal history of colonic polyps 03/21/2013  . Bipolar disorder, unspecified ([place]) 09/03/2011   Past Medical History:  Diagnosis Date  . Bipolar disorder ([place])   . 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 Depression   . GERD (gastroesophageal reflux disease)   . Headache   . Hepatitis B    Hepatitis E. antigen positive by history, status post treatment with Hepsera and baraclude   . Hepatitis B carrier ([place])   . Personal history of colonic polyps    Adenomas polyp 2008 and 2010, 2015   . Weight loss    Pt. has lost 10 lbs since pre-visit, intentionally with diet and walking   Family History  Problem Relation Age of Onset  . Heart disease Mother   . Diabetes Father   . Kidney failure Father   . Kidney disease Father   . Other Sister        GERD  . Colon cancer Neg Hx   . Rectal cancer Neg Hx   . Stomach cancer Neg Hx   . Esophageal cancer Neg Hx    Past Surgical History:  Procedure Laterality Date  . APPENDECTOMY    . COLONOSCOPY    . POLYPECTOMY    . UPPER GASTROINTESTINAL  ENDOSCOPY     Social History   Occupational History  . Occupation: retired    Fish farm manager: NOT EMPLOYED  Tobacco Use  . Smoking status: Former Smoker    Types: Cigarettes    Last attempt to quit: 05/08/2009    Years since quitting: 8.6  . Smokeless tobacco: Never Used  Substance and Sexual Activity  . Alcohol use: No    Alcohol/week: 0.0 standard drinks  . Drug use: No  . Sexual  activity: Not Currently

## 2017-12-26 NOTE — Therapy (Addendum)
[facility] [address]  [place], [place], [address] Phone: [phone]   Fax:  [phone]  Physical Therapy Progress Note  Patient Details  Name: Angel [name] MRN: [id number] Date of Birth: 1956-04-17 Referring Provider: Metta Clines, DO   Encounter Date: 12/26/2017  PT End of Session - 12/26/17 1807    Visit Number  10    Number of Visits  13    Date for PT Re-Evaluation  12/17/17    Authorization Type  BCBS    PT Start Time  1532    PT Stop Time  1616    PT Time Calculation (min)  44 min    Equipment Utilized During Treatment  Gait belt    Activity Tolerance  Patient tolerated treatment well    Behavior During Therapy  [facility] for tasks assessed/performed       Past Medical History:  Diagnosis Date  . Bipolar disorder ([place])   . 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 Depression   . GERD (gastroesophageal reflux disease)   . Headache   . Hepatitis B    Hepatitis E. antigen positive by history, status post treatment with Hepsera and baraclude   . Hepatitis B carrier ([place])   . Personal history of colonic polyps    Adenomas polyp 2008 and 2010, 2015   . Weight loss    Pt. has lost 10 lbs since pre-visit, intentionally with diet and walking    Past Surgical History:  Procedure Laterality Date  . APPENDECTOMY    . COLONOSCOPY    . POLYPECTOMY    . UPPER GASTROINTESTINAL ENDOSCOPY      There were no vitals filed for this visit.  Subjective Assessment - 12/26/17 1534    Subjective  Patient reports everything has been good since last session. No bumping into things or falls. Has been doing better going up steps. Reports 95% improvement since iniital eval. Reports he still feels off balance after getting up suddently after laying down and difficulty leaning forward. Neck has improved since he has been here.     Diagnostic tests  10/25/17 NCV with EMG of B LE: Chronic L5 radiculopathy affecting the  right lower extremity, mild in degree electrically. (Affecting R extensor digitorum, anterior tibialis, glute med)  There is no evidence of a large fiber sensorimotor polyneuropathy affecting the lower extremities.    Patient Stated Goals  get better balance    Currently in Pain?  No/denies         [facility] PT Assessment - 12/26/17 0001      Strength   Strength Assessment Site  Hip;Knee;Ankle    Right/Left Hip  Right;Left    Right Hip Flexion  4+/5    Right Hip ABduction  4+/5    Right Hip ADduction  4+/5    Left Hip Flexion  4/5    Left Hip ABduction  4+/5    Left Hip ADduction  4+/5    Right/Left Knee  Right;Left    Right Knee Flexion  4+/5    Right Knee Extension  4+/5    Left Knee Flexion  4/5    Left Knee Extension  4+/5    Right/Left Ankle  Right;Left    Right Ankle Dorsiflexion  4+/5    Right Ankle Plantar Flexion  4+/5    Left Ankle Dorsiflexion  4+/5    Left Ankle Plantar Flexion  4+/5  [facility] Adult PT Treatment/Exercise - 12/26/17 0001      Ambulation/Gait   Stairs  Yes    Stairs Assistance  7: Independent    Stair Management Technique  No rails    Number of Stairs  14    Height of Stairs  8    Gait Comments  Patient able to demonstrate stair climbing up/down 13 steps without handrail use without evidence of instability and no "stomping" of LEs when ascending      Balance   Balance Assessed  Yes      Static Standing Balance   Single Leg Stance - Right Leg  7    Single Leg Stance - Left Leg  10    Rhomberg - Eyes Opened  --   EO/foam: mild sway   Rhomberg - Eyes Closed  --   EC/foam: moderate sway     Knee/Hip Exercises: Aerobic   Recumbent Bike  L3 x 7 min      Knee/Hip Exercises: Standing   Functional Squat  1 set;10 reps;Limitations    Functional Squat Limitations  STS on foam with touching bottom on airex pad    SLS  R and L SLS practice x 5 min    Other Standing Knee Exercises  L & R SLS + 3 toe tap on dynadisc x10 each  LE    Other Standing Knee Exercises  grapevine and tandem walking with CGA x 5 min   cues for core and glute contraction     Vestibular Treatment/Exercise - 12/26/17 0001      Vestibular Treatment/Exercise   Vestibular Treatment Provided  Habituation    Habituation Exercises  Standing Horizontal Head Turns;Standing Vertical Head Turns      Standing Horizontal Head Turns   Number of Reps   10   VCs to tighten core and buttocks   Symptom Description   on foam with CGA; mod-severe sway      Standing Vertical Head Turns   Number of Reps   10    Symptom Description   on foam with CGA              PT Short Term Goals - 12/26/17 1537      PT SHORT TERM GOAL #1   Title  Patient to be independent with initial HEP.    Time  3    Period  Weeks    Status  Achieved        PT Long Term Goals - 12/26/17 1537      PT LONG TERM GOAL #1   Title  Patient to be independent with advanced HEP.    Time  6    Period  Weeks    Status  Achieved   reports compliance     PT LONG TERM GOAL #2   Title  Patient to demonstrate 4+/5 strength in B LEs.    Time  6    Period  Weeks    Status  Partially Met   improvement demonstrated in R hip flexion, R hip abduction, and R knee flexion strength     PT LONG TERM GOAL #3   Title  Patient to demonstrate SLS 10 sec on each LE without LOB.    Time  6    Period  Weeks    Status  Partially Met   10 sec on L LE, 7 sec on R LE     PT LONG TERM GOAL #4   Title  Patient to demonstrate  mild sway and no LOB with M-CTSIB EC/foam surface.    Time  6    Period  Weeks    Status  Partially Met   demonstrated moderate sway with EC/foam surface     PT LONG TERM GOAL #5   Title  Patient to demonstrate reciprocal stair climbing without handrail and with good eccentric control throughout without evidence of instability.    Time  6    Period  Weeks    Status  Achieved   Patient able to demonstrate stair climbing up/down 13 steps without handrail use  without evidence of instability and no "stomping" of LEs when ascending           Plan - 12/26/17 1806    Clinical Impression Statement  Patient arrived to session with report of 95% improvement since initial eval. Reports he still feels some imbalance when getting up suddenly as well as when bending forward. Updated goals- patient as met or partially met all goals. Improvement demonstrated in R hip flexion, R hip abduction, and R knee flexion strength. Patient able to perform SLS with 10 sec on L LE, 7 sec on R LE. Also able to demonstrate stair climbing up/down 13 steps without handrail use without evidence of instability and no "stomping" of LEs when ascending. Patient however still with some instability when standing on compliance surface. Worked on SLS activities predominantly this session with heavy cueing to encourage "standing up tall" through LE and activation of core. Good improvement in SLS performance with cueing. Patient without questions about previous consolidate HEP update. Advised patient to perform balance activities at counter top in case of LOB. Patient reported understanding. Patient to be placed on 30 day hold at this time d/t patient being pleased with current level of function.     PT Treatment/Interventions  ADLs/Self Care Home Management;Cryotherapy;Electrical Stimulation;Moist Heat;Ultrasound;DME Instruction;Gait training;Stair training;Functional mobility training;Therapeutic activities;Therapeutic exercise;Manual techniques;Orthotic Fit/Training;Patient/family education;Neuromuscular re-education;Balance training;Passive range of motion;Dry needling;Energy conservation;Splinting;Taping;Vasopneumatic Device    PT Next Visit Plan  30 day hold at this time    Consulted and Agree with Plan of Care  Patient       Patient will benefit from skilled therapeutic intervention in order to improve the following deficits and impairments:  Abnormal gait, Decreased strength, Pain,  Decreased balance, Decreased mobility, Difficulty walking, Improper body mechanics, Postural dysfunction  Visit Diagnosis: Unsteadiness on feet  Other abnormalities of gait and mobility  Muscle weakness (generalized)     Problem List Patient Active Problem List   Diagnosis Date Noted  . Hyperglycemia 10/07/2013  . Hepatic cirrhosis ([place]) 10/03/2013  . Bipolar I disorder, most recent episode (or current) depressed, unspecified 05/21/2013  . Hepatitis B 03/21/2013  . Esophageal reflux 03/21/2013  . Personal history of colonic polyps 03/21/2013  . Bipolar disorder, unspecified ([place]) 09/03/2011    Janene Harvey, PT, DPT 12/26/17 6:10 PM   [facility] [address]  [place], [place], [address] Phone: [phone]   Fax:  [phone]  Name: Angel [name] MRN: [id number] Date of Birth: 30-Jun-1955  PHYSICAL THERAPY DISCHARGE SUMMARY  Visits from Start of Care: 10  Current functional level related to goals / functional outcomes: See above clinical summary   Remaining deficits: LE weakness, imbalance   Education / Equipment: HEP  Plan: Patient agrees to discharge.  Patient goals were partially met. Patient is being discharged due to being pleased with the current functional level.  ?????  Janene Harvey, PT, DPT  01/28/18 1:21 PM

## 2017-12-26 NOTE — Procedures (Signed)
Cervical Epidural Steroid Injection - Interlaminar Approach with Fluoroscopic Guidance  Patient: Angel [name]      Date of Birth: 1955-07-07 MRN: [id number] PCP: [name], PA-C      Visit Date: 12/10/2017   Universal Protocol:    Date/Time: 08/21/196:47 AM  Consent Given By: the patient  Position: PRONE  Additional Comments: Vital signs were monitored before and after the procedure. Patient was prepped and draped in the usual sterile fashion. The correct patient, procedure, and site was verified.   Injection Procedure Details:  Procedure Site One Meds Administered:  Meds ordered this encounter  Medications  . methylPREDNISolone acetate (DEPO-MEDROL) injection 80 mg     Laterality: Right  Location/Site: C7-T1  Needle size: 20 G  Needle type: Touhy  Needle Placement: Paramedian epidural space  Findings:  -Comments: Excellent flow of contrast into the epidural space.  Procedure Details: Using a paramedian approach from the side mentioned above, the region overlying the inferior lamina was localized under fluoroscopic visualization and the soft tissues overlying this structure were infiltrated with 4 ml. of 1% Lidocaine without Epinephrine. A # 20 gauge, Tuohy needle was inserted into the epidural space using a paramedian approach.  The epidural space was localized using loss of resistance along with lateral and contralateral oblique bi-planar fluoroscopic views.  After negative aspirate for air, blood, and CSF, a 2 ml. volume of Isovue-250 was injected into the epidural space and the flow of contrast was observed. Radiographs were obtained for documentation purposes.   The injectate was administered into the level noted above.  Additional Comments:  The patient tolerated the procedure well Dressing: Band-Aid    Post-procedure details: Patient was observed during the procedure. Post-procedure instructions were reviewed.  Patient left the clinic in stable  condition.

## 2018-01-10 ENCOUNTER — Ambulatory Visit (INDEPENDENT_AMBULATORY_CARE_PROVIDER_SITE_OTHER): Payer: BLUE CROSS/BLUE SHIELD | Admitting: Medical

## 2018-01-10 ENCOUNTER — Encounter: Payer: Self-pay | Admitting: Medical

## 2018-01-10 VITALS — BP 125/83 | HR 80 | Temp 98.2°F | Resp 16 | Ht 70.5 in | Wt 202.0 lb

## 2018-01-10 DIAGNOSIS — F325 Major depressive disorder, single episode, in full remission: Secondary | ICD-10-CM

## 2018-01-10 DIAGNOSIS — G4700 Insomnia, unspecified: Secondary | ICD-10-CM

## 2018-01-10 DIAGNOSIS — Z8619 Personal history of other infectious and parasitic diseases: Secondary | ICD-10-CM | POA: Diagnosis not present

## 2018-01-10 DIAGNOSIS — F319 Bipolar disorder, unspecified: Secondary | ICD-10-CM

## 2018-01-10 MED ORDER — TENOFOVIR DISOPROXIL FUMARATE 300 MG TABLET
ORAL_TABLET | Freq: Every day | ORAL | 0 refills | 0 days | Status: CP
Start: 2018-01-10 — End: 2018-02-04

## 2018-01-10 MED ORDER — ZOLPIDEM TARTRATE 5 MG PO TABS: ORAL_TABLET | ORAL | 0 refills | Status: DC

## 2018-01-10 NOTE — Patient Instructions (Addendum)
For history of insomnia recent worsening over past week, I will prescribe hydroxyzine tabs 25 mg 1-2 tab at night. Can continue other med same except not to use benadryl. Talked with pharmacist and 7 days supply of all your meds will be given. She states can give emergency 7 day limited supply of klonopin and will make note on circumstances.  Tried to call pharmacist and stayed on line for 10 minutes no answer. So will ask Jasmine CMA to call pharmacist and ask to fill all meds x 1 wk including hep b med. Did eventually contact and confirmed refill of meds.  Keep appointment with psychiatrist at end of the month  Follow up 7 days or as needed
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
## 2018-01-31 ENCOUNTER — Encounter

## 2018-01-31 ENCOUNTER — Ambulatory Visit (HOSPITAL_COMMUNITY): Payer: Self-pay | Admitting: Psychiatry

## 2018-02-02 ENCOUNTER — Other Ambulatory Visit (HOSPITAL_COMMUNITY): Payer: Self-pay

## 2018-02-02 MED ORDER — TRAZODONE HCL 50 MG PO TABS: ORAL_TABLET | ORAL | 0 refills | Status: DC

## 2018-02-04 ENCOUNTER — Other Ambulatory Visit: Payer: Self-pay | Admitting: Medical

## 2018-02-04 MED ORDER — NADOLOL 40 MG TABLET
ORAL_TABLET | Freq: Every day | ORAL | 3 refills | 0 days | Status: CP
Start: 2018-02-04 — End: 2018-05-06

## 2018-02-04 MED ORDER — TENOFOVIR DISOPROXIL FUMARATE 300 MG TABLET
ORAL_TABLET | Freq: Every day | ORAL | 3 refills | 0.00000 days | Status: CP
Start: 2018-02-04 — End: 2018-04-23

## 2018-02-05 ENCOUNTER — Encounter (HOSPITAL_COMMUNITY): Payer: Self-pay | Admitting: Psychiatry

## 2018-02-05 ENCOUNTER — Ambulatory Visit (INDEPENDENT_AMBULATORY_CARE_PROVIDER_SITE_OTHER): Payer: BLUE CROSS/BLUE SHIELD | Admitting: Psychiatry

## 2018-02-05 ENCOUNTER — Other Ambulatory Visit (INDEPENDENT_AMBULATORY_CARE_PROVIDER_SITE_OTHER): Payer: Self-pay | Admitting: Physical Medicine and Rehabilitation

## 2018-02-05 VITALS — BP 120/72 | HR 65 | Ht 68.25 in | Wt 208.0 lb

## 2018-02-05 DIAGNOSIS — Z87891 Personal history of nicotine dependence: Secondary | ICD-10-CM

## 2018-02-05 DIAGNOSIS — F330 Major depressive disorder, recurrent, mild: Secondary | ICD-10-CM

## 2018-02-05 DIAGNOSIS — F3162 Bipolar disorder, current episode mixed, moderate: Secondary | ICD-10-CM

## 2018-02-05 NOTE — Progress Notes (Signed)
Patient ID: Angel [name], male   DOB: 08/03/1955, 62 y.o.   MRN: [id number] [facility] MD Progress Note  02/05/2018 3:06 PM Angel [name]  MRN:  [id number] Subjective:  Feeling great Principal Problem: Bipolar disorder Past Medical History:  Today the patient is doing fairly well.  His mood is stable.  Today in the [name] we had his partner Angel [name] via phone.  Angel [name] shares that he thinks the patient at times is managed through the day.  I think the patient likely has some mild elevated mood states.  According to Angel [name] his mood shifts a lot.  At this time it is noted that his Tegretol level is low and his lithium is possibly low.  He has to do with when the lithium was drawn.  Overall though the patient today is not irritable nor is euphoric.  According to Angel [name] he does not show super amount of activity.  His speech is normal.  He does not have racing thinking.  Does not really do anything excessive dangerous or risky.  Generally can focus and concentrate and is sleeping well things days.  He is eating well.  Recently his primary care doctor to get him on Halcion for sleep seems to help.  The patient has no side effects from his Tegretol numbers from his lithium.  He continues taking Effexor as prescribed.  Patient denies the use of alcohol or drugs.  He has never been psychotic.  The patient denies chest pain shortness of breath or any neurological symptoms at this time.  The patient is positive and optimistic although his partner seems to describe some mild euphoria.  At this time unfortunately his partner is not here in our next visit we will make sure that he is. Past Medical History:  Diagnosis Date  . Bipolar disorder ([place])   . 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 Depression   . GERD (gastroesophageal reflux disease)   . Headache   . Hepatitis B    Hepatitis E. antigen positive by history, status post treatment with Hepsera and baraclude   . Hepatitis B carrier ([place])   . Personal history of  colonic polyps    Adenomas polyp 2008 and 2010, 2015   . Weight loss    Pt. has lost 10 lbs since pre-visit, intentionally with diet and walking    Past Surgical History:  Procedure Laterality Date  . APPENDECTOMY    . COLONOSCOPY    . POLYPECTOMY    . UPPER GASTROINTESTINAL ENDOSCOPY     Family History:  Family History  Problem Relation Age of Onset  . Heart disease Mother   . Diabetes Father   . Kidney failure Father   . Kidney disease Father   . Other Sister        GERD  . Colon cancer Neg Hx   . Rectal cancer Neg Hx   . Stomach cancer Neg Hx   . Esophageal cancer Neg Hx    Family Psychiatric  History:  Social History:  Social History   Substance and Sexual Activity  Alcohol Use No  . Alcohol/week: 0.0 standard drinks     Social History   Substance and Sexual Activity  Drug Use No    Social History   Socioeconomic History  . Marital status: Married    Spouse name: Angel [name]  . Number of children: 0  . Years of education: Not on file  . Highest education level: Master's degree (e.g., MA, MS, MEng, MEd,  MSW, MBA)  Occupational History  . 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 Financial resource strain: Not on file  . Food insecurity:    Worry: Not on file    Inability: Not on file  . Transportation needs:    Medical: Not on file    Non-medical: Not on file  Tobacco Use  . Smoking status: Former Smoker    Types: Cigarettes    Last attempt to quit: 05/08/2009    Years since quitting: 8.7  . Smokeless tobacco: Never Used  Substance and Sexual Activity  . Alcohol use: No    Alcohol/week: 0.0 standard drinks  . Drug use: No  . Sexual activity: Not Currently  Lifestyle  . Physical activity:    Days per week: Not on file    Minutes per session: Not on file  . Stress: Not on file  Relationships  . Social connections:    Talks on phone: Not on file    Gets together: Not on file    Attends religious service: Not on file     Active member of club or organization: Not on file    Attends meetings of clubs or organizations: Not on file    Relationship status: Not on file  Other Topics Concern  . Not on file  Social History Narrative   Patient is right-handed. He is married to same sex partner. He drinks 6-9 glasses of tea a day. He gets little exercise.   Additional Social History:                         Sleep: Good  Appetite:  Good  Current Medications:   Lab Results: No results found for this or any previous visit (from the past 48 hour(s)).  Physical Findings: AIMS:  , ,  ,  ,    CIWA:    COWS:     Musculoskeletal: Strength & Muscle Tone: within normal limits Gait & Station: normal Patient leans: N/A  Psychiatric Specialty Exam: ROSE Angel [name] is resistant Patient ID: Angel [name], male   DOB: 05/23/1955, 62 y.o.   MRN: [id number] [name] MD Progress Note  02/05/2018 3:06 PM Angel [name]  MRN:  [id number] Subjective:  Feeling great Principal Problem: Bipolar disorder, most recent episode depression Today the patient is seen with his partner care. Angel [name] patient is doing well. His mood is stable. He shows no signs of mania. His sleep is a little erratic but is not really that big of a problem. He's eating well has good energy enjoying life. He uses no alcohol or drugs. His hepatitis B cirrhosis is stable. Medically he is feeling well. Initially he stable. He's got good relationship with his partner with others around him. There is no evidence of alcohol or drug use in this individual. He takes his medicines just as prescribed. Past Medical History:  Past Medical History:  Diagnosis Date  . Bipolar disorder ([place])   . 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 Depression   . GERD (gastroesophageal reflux disease)   . Headache   . Hepatitis B    Hepatitis E. antigen positive by history, status post treatment with Hepsera and baraclude   . Hepatitis B carrier ([place])   . Personal  history of colonic polyps    Adenomas polyp 2008 and 2010, 2015   . Weight loss    Pt. has lost 10 lbs since pre-visit, intentionally with  diet and walking    Past Surgical History:  Procedure Laterality Date  . APPENDECTOMY    . COLONOSCOPY    . POLYPECTOMY    . UPPER GASTROINTESTINAL ENDOSCOPY     Family History:  Family History  Problem Relation Age of Onset  . Heart disease Mother   . Diabetes Father   . Kidney failure Father   . Kidney disease Father   . Other Sister        GERD  . Colon cancer Neg Hx   . Rectal cancer Neg Hx   . Stomach cancer Neg Hx   . Esophageal cancer Neg Hx    Family Psychiatric  History:  Social History:  Social History   Substance and Sexual Activity  Alcohol Use No  . Alcohol/week: 0.0 standard drinks     Social History   Substance and Sexual Activity  Drug Use No    Social History   Socioeconomic History  . Marital status: Married    Spouse name: Angel [name]  . Number of children: 0  . Years of education: Not on file  . Highest education level: Master's degree (e.g., MA, MS, MEng, MEd, MSW, MBA)  Occupational History  . 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 Financial resource strain: Not on file  . Food insecurity:    Worry: Not on file    Inability: Not on file  . Transportation needs:    Medical: Not on file    Non-medical: Not on file  Tobacco Use  . Smoking status: Former Smoker    Types: Cigarettes    Last attempt to quit: 05/08/2009    Years since quitting: 8.7  . Smokeless tobacco: Never Used  Substance and Sexual Activity  . Alcohol use: No    Alcohol/week: 0.0 standard drinks  . Drug use: No  . Sexual activity: Not Currently  Lifestyle  . Physical activity:    Days per week: Not on file    Minutes per session: Not on file  . Stress: Not on file  Relationships  . Social connections:    Talks on phone: Not on file    Gets together: Not on file    Attends religious service: Not  on file    Active member of club or organization: Not on file    Attends meetings of clubs or organizations: Not on file    Relationship status: Not on file  Other Topics Concern  . Not on file  Social History Narrative   Patient is right-handed. He is married to same sex partner. He drinks 6-9 glasses of tea a day. He gets little exercise.   Additional Social History:                         Sleep: Good  Appetite:  Good  Current Medications:   Lab Results: No results found for this or any previous visit (from the past 48 hour(s)).  Physical Findings: AIMS:  , ,  ,  ,    CIWA:    COWS:     Musculoskeletal: Strength & Muscle Tone: within normal limits Gait & Station: normal Patient leans: N/A  Psychiatric Specialty Exam: ROS  There were no vitals taken for this visit.There is no height or weight on file to calculate BMI.  General Appearance: Casual  Eye Contact:: Good   Speech:  Clear and Coherent  Volume:  Normal  Mood:  Euthymic  Affect:  NA and Appropriate  Thought Process:  Coherent  Orientation:  Full (Time, Place, and Person)  Thought Content:  WDL  Suicidal Thoughts:  No  Homicidal Thoughts:  No  Memory:  NA  Judgement:  Good  Insight:  Good  Psychomotor Activity:  Normal  Concentration:  Good  Recall:  Good  Fund of Knowledge:Good  Language: Good  Akathisia:  No  Handed:  Right  AIMS (if indicated):     Assets:  Desire for Improvement  ADL's:  Intact  Cognition: WNL  Sleep:      Treatment Plan Summary: 02/05/2018,  At this time the patient takes lithium 600 mg 1 in the morning and 2 at night he continues taking trazodone for sleep and continues Tegretol as prescribed. He no longer takes any neuroleptics. He described a little bit of a movie or sound disorder that might be related to tardive dyskinesia. Today had aims scale showed no evidence of TD. The patient continue taking his Effexor as prescribed continue all the other medicines.  Patient actually is very stable. Return to see me in 3 months.  There were no vitals taken for this visit.There is no height or weight on file to calculate BMI.  General Appearance: Casual  Eye Contact:: Good   Speech:  Clear and Coherent  Volume:  Normal  Mood:  Euthymic  Affect:  NA and Appropriate  Thought Process:  Coherent  Orientation:  Full (Time, Place, and Person)  Thought Content:  WDL  Suicidal Thoughts:  No  Homicidal Thoughts:  No  Memory:  NA  Judgement:  Good  Insight:  Good  Psychomotor Activity:  Normal  Concentration:  Good  Recall:  Good  Fund of Knowledge:Good  Language: Good  Akathisia:  No  Handed:  Right  AIMS (if indicated):     Assets:  Desire for Improvement  ADL's:  Intact  Cognition: WNL  Sleep:      Treatment Plan Summary: 02/05/2018,  At this time the first problem this patient is has bipolar disorder and a question of mania.  I am not clear if he truly has mania and will have to have his primary care or his next visit in 1 month.  At this time because his Tegretol level is actually low less than 5 we will go ahead and increase his Tegretol to taking 200 mg 2 in the morning and 2 at night.  He will continue taking lithium 1 in the morning and 2 at night.  He will continue taking Klonopin 1 mg 2 at night.  This first problem is that of bipolar disorder and we will adjust his Tegretol for this condition.  Second problem is problems with his sleep.  He will continue taking Klonopin 2 at night and will continue taking Halcion from his primary care doctor.  The patient actually is I think quite stable.  In the next 2 weeks he will get a lithium level and a Tegretol level.  He will return to see me in 1 month.

## 2018-02-06 ENCOUNTER — Telehealth (HOSPITAL_COMMUNITY): Payer: Self-pay

## 2018-02-06 ENCOUNTER — Other Ambulatory Visit: Payer: Self-pay | Admitting: Medical

## 2018-02-06 DIAGNOSIS — F330 Major depressive disorder, recurrent, mild: Secondary | ICD-10-CM

## 2018-02-06 NOTE — Telephone Encounter (Signed)
Please advise 

## 2018-02-06 NOTE — Telephone Encounter (Signed)
Patient called and said that the prescription for Clonazepam 1mg  was never received in the pharmacy. Please send a refill to [facility] on [address] in [place].

## 2018-02-07 ENCOUNTER — Encounter: Payer: Self-pay | Admitting: Medical

## 2018-02-07 ENCOUNTER — Other Ambulatory Visit (HOSPITAL_COMMUNITY): Payer: Self-pay

## 2018-02-07 DIAGNOSIS — F330 Major depressive disorder, recurrent, mild: Secondary | ICD-10-CM

## 2018-02-07 MED ORDER — CLONAZEPAM 1 MG PO TABS: ORAL_TABLET | ORAL | 0 refills | Status: DC

## 2018-02-07 NOTE — Telephone Encounter (Signed)
Per your note, patient was to continue his Klonopin. I called in a one month supply, patient comes back on 11/1

## 2018-02-19 ENCOUNTER — Other Ambulatory Visit (HOSPITAL_COMMUNITY): Payer: Self-pay | Admitting: Psychiatry

## 2018-02-28 ENCOUNTER — Encounter: Admit: 2018-02-28 | Discharge: 2018-03-01 | Payer: MEDICARE

## 2018-02-28 DIAGNOSIS — B181 Chronic viral hepatitis B without delta-agent: Principal | ICD-10-CM

## 2018-02-28 DIAGNOSIS — K7469 Other cirrhosis of liver: Secondary | ICD-10-CM

## 2018-02-28 DIAGNOSIS — I8500 Esophageal varices without bleeding: Secondary | ICD-10-CM | POA: Diagnosis not present

## 2018-02-28 DIAGNOSIS — R161 Splenomegaly, not elsewhere classified: Secondary | ICD-10-CM | POA: Diagnosis not present

## 2018-02-28 DIAGNOSIS — K766 Portal hypertension: Secondary | ICD-10-CM | POA: Diagnosis not present

## 2018-02-28 DIAGNOSIS — K7460 Unspecified cirrhosis of liver: Secondary | ICD-10-CM | POA: Diagnosis not present

## 2018-02-28 DIAGNOSIS — K769 Liver disease, unspecified: Secondary | ICD-10-CM | POA: Diagnosis not present

## 2018-02-28 DIAGNOSIS — R932 Abnormal findings on diagnostic imaging of liver and biliary tract: Secondary | ICD-10-CM | POA: Diagnosis not present

## 2018-03-04 DIAGNOSIS — B181 Chronic viral hepatitis B without delta-agent: Secondary | ICD-10-CM | POA: Diagnosis not present

## 2018-03-04 DIAGNOSIS — Z79899 Other long term (current) drug therapy: Secondary | ICD-10-CM | POA: Diagnosis not present

## 2018-03-04 DIAGNOSIS — K7460 Unspecified cirrhosis of liver: Secondary | ICD-10-CM | POA: Diagnosis not present

## 2018-03-08 ENCOUNTER — Encounter

## 2018-03-08 ENCOUNTER — Ambulatory Visit (HOSPITAL_COMMUNITY): Payer: BLUE CROSS/BLUE SHIELD | Admitting: Psychiatry

## 2018-03-14 ENCOUNTER — Ambulatory Visit (INDEPENDENT_AMBULATORY_CARE_PROVIDER_SITE_OTHER): Payer: BLUE CROSS/BLUE SHIELD | Admitting: Medical

## 2018-03-14 ENCOUNTER — Encounter: Payer: Self-pay | Admitting: Medical

## 2018-03-14 ENCOUNTER — Other Ambulatory Visit: Payer: Self-pay

## 2018-03-14 VITALS — BP 120/64 | HR 78 | Temp 98.3°F | Resp 18 | Ht 70.0 in | Wt 212.0 lb

## 2018-03-14 DIAGNOSIS — R350 Frequency of micturition: Secondary | ICD-10-CM | POA: Diagnosis not present

## 2018-03-14 DIAGNOSIS — Z23 Encounter for immunization: Secondary | ICD-10-CM

## 2018-03-14 DIAGNOSIS — R631 Polydipsia: Secondary | ICD-10-CM | POA: Diagnosis not present

## 2018-03-14 DIAGNOSIS — Z833 Family history of diabetes mellitus: Secondary | ICD-10-CM | POA: Diagnosis not present

## 2018-03-14 DIAGNOSIS — F319 Bipolar disorder, unspecified: Secondary | ICD-10-CM | POA: Diagnosis not present

## 2018-03-14 DIAGNOSIS — Z125 Encounter for screening for malignant neoplasm of prostate: Secondary | ICD-10-CM

## 2018-03-14 LAB — COMPREHENSIVE METABOLIC PANEL
ALT: 39 U/L (ref 0–53)
AST: 27 U/L (ref 0–37)
Albumin: 4.4 g/dL (ref 3.5–5.2)
Alkaline Phosphatase: 82 U/L (ref 39–117)
BILIRUBIN TOTAL: 0.6 mg/dL (ref 0.2–1.2)
BUN: 13 mg/dL (ref 6–23)
CALCIUM: 9.6 mg/dL (ref 8.4–10.5)
CO2: 32 meq/L (ref 19–32)
CREATININE: 0.88 mg/dL (ref 0.40–1.50)
Chloride: 107 mEq/L (ref 96–112)
GFR: 92.95 mL/min (ref >60.00)
GLUCOSE: 104 mg/dL — AB (ref 70–99)
Potassium: 4 mEq/L (ref 3.5–5.1)
Sodium: 144 mEq/L (ref 135–145)
TOTAL PROTEIN: 6.9 g/dL (ref 6.0–8.3)

## 2018-03-14 LAB — POC URINALSYSI DIPSTICK (AUTOMATED)
BILIRUBIN UA: NEGATIVE
Blood, UA: NEGATIVE
Glucose, UA: NEGATIVE
KETONES UA: NEGATIVE
Leukocytes, UA: NEGATIVE
Nitrite, UA: NEGATIVE
PH UA: 6.5 (ref 5.0–8.0)
PROTEIN UA: NEGATIVE
SPEC GRAV UA: 1.01 (ref 1.010–1.025)
Urobilinogen, UA: 0.2 E.U./dL

## 2018-03-14 LAB — HEMOGLOBIN A1C: HEMOGLOBIN A1C: 5.8 % (ref 4.6–6.5)

## 2018-03-14 LAB — GLUCOSE, POCT (MANUAL RESULT ENTRY): POC GLUCOSE: 99 mg/dL (ref 70–99)

## 2018-03-14 LAB — PSA: PSA: 3.55 ng/mL (ref 0.10–4.00)

## 2018-03-14 NOTE — Progress Notes (Signed)
Subjective:    Patient ID: Angel [name], male    DOB: 17-Aug-1955, 62 y.o.   MRN: [id number]  HPI  Pt in states he is very thirsty. Drinks  a lot each day. Also urinates a lot but not hungry all the time. Pt sugar last time we checked was normal. He drinks sugar free beverages. Pt dad had diabetes. Eventually his dad needed dialysis and had uncontrolled diabetes.  Pt mood overall controlled recently. Pt is on tegretol, lithium, trazadone and venlafaxine. Pt has been seeing Dr. Ardis Rowan. Last seen in October. Will follow up in December.  Pt does want flu shot.    Review of Systems  Constitutional: Negative for chills, diaphoresis, fatigue and fever.  Respiratory: Negative for cough, chest tightness, shortness of breath and wheezing.   Gastrointestinal: Negative for abdominal pain.  Endocrine: Positive for polydipsia and polyuria. Negative for polyphagia.  Musculoskeletal: Negative for back pain and neck pain.  Skin: Negative for rash.  Neurological: Negative for syncope and weakness.  Hematological: Negative for adenopathy. Does not bruise/bleed easily.  Psychiatric/Behavioral: Negative for behavioral problems, confusion, dysphoric mood, sleep disturbance and suicidal ideas. The patient is not nervous/anxious.     Past Medical History:  Diagnosis Date  . Bipolar disorder ([place])   . 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 Depression   . GERD (gastroesophageal reflux disease)   . Headache   . Hepatitis B    Hepatitis E. antigen positive by history, status post treatment with Hepsera and baraclude   . Hepatitis B carrier ([place])   . Personal history of colonic polyps    Adenomas polyp 2008 and 2010, 2015   . Weight loss    Pt. has lost 10 lbs since pre-visit, intentionally with diet and walking     Social History   Socioeconomic History  . Marital status: Married    Spouse name: [name]  . Number of children: 0  . Years of education: Not on file  . Highest  education level: Master's degree (e.g., MA, MS, MEng, MEd, MSW, MBA)  Occupational History  . 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 Financial resource strain: Not on file  . Food insecurity:    Worry: Not on file    Inability: Not on file  . Transportation needs:    Medical: Not on file    Non-medical: Not on file  Tobacco Use  . Smoking status: Former Smoker    Types: Cigarettes    Last attempt to quit: 05/08/2009    Years since quitting: 8.8  . Smokeless tobacco: Never Used  Substance and Sexual Activity  . Alcohol use: No    Alcohol/week: 0.0 standard drinks  . Drug use: No  . Sexual activity: Not Currently  Lifestyle  . Physical activity:    Days per week: Not on file    Minutes per session: Not on file  . Stress: Not on file  Relationships  . Social connections:    Talks on phone: Not on file    Gets together: Not on file    Attends religious service: Not on file    Active member of club or organization: Not on file    Attends meetings of clubs or organizations: Not on file    Relationship status: Not on file  . Intimate partner violence:    Fear of current or ex partner: Not on file    Emotionally abused: Not on file  Physically abused: Not on file    Forced sexual activity: Not on file  Other Topics Concern  . Not on file  Social History Narrative   Patient is right-handed. He is married to same sex partner. He drinks 6-9 glasses of tea a day. He gets little exercise.    Past Surgical History:  Procedure Laterality Date  . APPENDECTOMY    . COLONOSCOPY    . POLYPECTOMY    . UPPER GASTROINTESTINAL ENDOSCOPY      Family History  Problem Relation Age of Onset  . Heart disease Mother   . Diabetes Father   . Kidney failure Father   . Kidney disease Father   . Other Sister        GERD  . Colon cancer Neg Hx   . Rectal cancer Neg Hx   . Stomach cancer Neg Hx   . Esophageal cancer Neg Hx     Allergies  Allergen Reactions    . Aripiprazole Other (See Comments)    Tardive Dyskinesia  . Invega [Paliperidone Er] Other (See Comments)    Tardive dyskinsia symptoms  . Abilify [Aripiprazole]   . Lamictal [Lamotrigine] Rash  . Lexapro [Escitalopram Oxalate] Other (See Comments)    Doesn't Work      BP 120/64 (BP Location: Left Arm, Patient Position: Sitting, Cuff Size: Normal)   Pulse 78   Temp 98.3 F (36.8 C) (Oral)   Resp 18   Ht 5\' 10"  (1.778 m)   Wt 212 lb (96.2 kg)   SpO2 96%   BMI 30.42 kg/m       Objective:   Physical Exam   General Mental Status- Alert. General Appearance- Not in acute distress.   Skin General: Color- Normal Color. Moisture- Normal Moisture.  Neck Carotid Arteries- Normal color. Moisture- Normal Moisture. No carotid bruits. No JVD.  Chest and Lung Exam Auscultation: Breath Sounds:-Normal.  Cardiovascular Auscultation:Rythm- Regular. Murmurs & Other Heart Sounds:Auscultation of the heart reveals- No Murmurs.  Abdomen Inspection:-Inspeection Normal. Palpation/Percussion:Note:No mass. Palpation and Percussion of the abdomen reveal- Non Tender, Non Distended + BS, no rebound or guarding.    Neurologic Cranial Nerve exam:- CN III-XII intact(No nystagmus), symmetric smile. Strength:- 5/5 equal and symmetric strength both upper and lower extremities.     Assessment & Plan:  For your recent frequent urination and excessive thirst, we will get metabolic panel and T5T today.  In addition we will get a urinalysis and a PSA.  If urine shows any indicator of potential infection and will get a culture as well.  We will update you on results of studies when they are in.  Your mood appears better overall.  Continue current medications prescribed by psychiatrist and follow-up with them in December.  Flu vaccine today.  Follow-up date to be determined after lab review  [name], PA-C

## 2018-03-14 NOTE — Patient Instructions (Signed)
For your recent frequent urination and excessive thirst, we will get metabolic panel and T5T today.  In addition we will get a urinalysis and a PSA.  If urine shows any indicator of potential infection and will get a culture as well.  We will update you on results of studies when they are in.  Your mood appears better overall.  Continue current medications prescribed by psychiatrist and follow-up with them in December.  Flu vaccine today.  Follow-up date to be determined after lab review

## 2018-03-18 ENCOUNTER — Other Ambulatory Visit (HOSPITAL_COMMUNITY): Payer: Self-pay

## 2018-03-18 DIAGNOSIS — F330 Major depressive disorder, recurrent, mild: Secondary | ICD-10-CM

## 2018-03-18 MED ORDER — CLONAZEPAM 1 MG PO TABS: ORAL_TABLET | ORAL | 0 refills | Status: DC

## 2018-04-10 ENCOUNTER — Other Ambulatory Visit (HOSPITAL_COMMUNITY): Payer: Self-pay

## 2018-04-10 MED ORDER — TRAZODONE HCL 50 MG PO TABS: ORAL_TABLET | ORAL | 12 refills | Status: DC

## 2018-04-10 MED ORDER — CARBAMAZEPINE ER 200 MG PO TB12: ORAL_TABLET | ORAL | 2 refills | Status: DC

## 2018-04-12 ENCOUNTER — Ambulatory Visit (HOSPITAL_COMMUNITY): Payer: BLUE CROSS/BLUE SHIELD | Admitting: Psychiatry

## 2018-04-15 ENCOUNTER — Other Ambulatory Visit (HOSPITAL_COMMUNITY): Payer: Self-pay

## 2018-04-15 DIAGNOSIS — F330 Major depressive disorder, recurrent, mild: Secondary | ICD-10-CM

## 2018-04-15 MED ORDER — CLONAZEPAM 1 MG PO TABS: ORAL_TABLET | ORAL | 2 refills | Status: DC

## 2018-04-20 ENCOUNTER — Other Ambulatory Visit (HOSPITAL_COMMUNITY): Payer: Self-pay

## 2018-04-20 MED ORDER — LITHIUM CARBONATE 600 MG PO CAPS: ORAL_CAPSULE | ORAL | 1 refills | Status: DC

## 2018-04-23 ENCOUNTER — Encounter: Admit: 2018-04-23 | Discharge: 2018-04-23 | Payer: MEDICARE

## 2018-04-23 ENCOUNTER — Encounter: Admit: 2018-04-23 | Discharge: 2018-04-23 | Payer: MEDICARE | Attending: Gastroenterology | Primary: Gastroenterology

## 2018-04-23 DIAGNOSIS — M899 Disorder of bone, unspecified: Secondary | ICD-10-CM

## 2018-04-23 DIAGNOSIS — K7469 Other cirrhosis of liver: Principal | ICD-10-CM

## 2018-04-23 DIAGNOSIS — Z6831 Body mass index (BMI) 31.0-31.9, adult: Secondary | ICD-10-CM | POA: Diagnosis not present

## 2018-04-23 MED ORDER — TENOFOVIR ALAFENAMIDE 25 MG TABLET
ORAL_TABLET | Freq: Every day | ORAL | 11 refills | 0.00000 days | Status: CP
Start: 2018-04-23 — End: 2018-07-24

## 2018-05-06 ENCOUNTER — Other Ambulatory Visit (HOSPITAL_COMMUNITY): Payer: Self-pay

## 2018-05-06 DIAGNOSIS — F331 Major depressive disorder, recurrent, moderate: Secondary | ICD-10-CM

## 2018-05-06 MED ORDER — NADOLOL 40 MG TABLET
ORAL_TABLET | Freq: Every day | ORAL | 3 refills | 0 days | Status: CP
Start: 2018-05-06 — End: 2019-05-01

## 2018-05-06 MED ORDER — VENLAFAXINE HCL ER 150 MG PO CP24: 150.0000 mg | ORAL_CAPSULE | Freq: Every morning | ORAL | 0 refills | Status: DC

## 2018-05-08 ENCOUNTER — Other Ambulatory Visit: Payer: Self-pay | Admitting: Medical

## 2018-05-08 ENCOUNTER — Other Ambulatory Visit (HOSPITAL_COMMUNITY): Payer: Self-pay | Admitting: Psychiatry

## 2018-05-08 DIAGNOSIS — K219 Gastro-esophageal reflux disease without esophagitis: Secondary | ICD-10-CM

## 2018-05-08 DIAGNOSIS — F331 Major depressive disorder, recurrent, moderate: Secondary | ICD-10-CM

## 2018-05-10 ENCOUNTER — Other Ambulatory Visit: Payer: Self-pay | Admitting: Medical

## 2018-05-13 ENCOUNTER — Encounter: Payer: Self-pay | Admitting: Medical

## 2018-05-13 ENCOUNTER — Telehealth: Payer: Self-pay | Admitting: Medical

## 2018-05-13 MED ORDER — BISACODYL 5 MG PO TBEC: 5.0000 mg | DELAYED_RELEASE_TABLET | Freq: Every day | ORAL | 0 refills | Status: DC | PRN

## 2018-05-13 NOTE — Telephone Encounter (Signed)
rx dulcolax sent to pt pharmacy.

## 2018-05-22 ENCOUNTER — Telehealth: Payer: Self-pay | Admitting: Medical

## 2018-05-22 ENCOUNTER — Encounter: Payer: Self-pay | Admitting: Medical

## 2018-05-22 MED ORDER — BISACODYL 5 MG PO TBEC: 5.0000 mg | DELAYED_RELEASE_TABLET | Freq: Every day | ORAL | 0 refills | Status: DC | PRN

## 2018-05-22 NOTE — Telephone Encounter (Signed)
Opened to review 

## 2018-05-22 NOTE — Telephone Encounter (Signed)
Rx dulcolax sent to [facility] at pt request.

## 2018-06-13 ENCOUNTER — Encounter: Payer: Self-pay | Admitting: Medical

## 2018-06-18 ENCOUNTER — Other Ambulatory Visit (HOSPITAL_COMMUNITY): Payer: Self-pay

## 2018-06-18 DIAGNOSIS — F319 Bipolar disorder, unspecified: Secondary | ICD-10-CM

## 2018-06-19 ENCOUNTER — Encounter (HOSPITAL_COMMUNITY): Payer: Self-pay | Admitting: Psychiatry

## 2018-06-19 ENCOUNTER — Ambulatory Visit (INDEPENDENT_AMBULATORY_CARE_PROVIDER_SITE_OTHER): Payer: BLUE CROSS/BLUE SHIELD | Admitting: Psychiatry

## 2018-06-19 VITALS — BP 126/74 | HR 70 | Ht 70.0 in | Wt 218.8 lb

## 2018-06-19 DIAGNOSIS — F314 Bipolar disorder, current episode depressed, severe, without psychotic features: Secondary | ICD-10-CM

## 2018-06-19 DIAGNOSIS — F330 Major depressive disorder, recurrent, mild: Secondary | ICD-10-CM

## 2018-06-19 DIAGNOSIS — G4709 Other insomnia: Secondary | ICD-10-CM

## 2018-06-19 LAB — LITHIUM LEVEL: Lithium Lvl: 1.1 mmol/L (ref 0.6–1.2)

## 2018-06-19 MED ORDER — DIVALPROEX SODIUM ER 250 MG PO TB24: ORAL_TABLET | ORAL | 5 refills | Status: DC

## 2018-06-19 MED ORDER — CLONAZEPAM 1 MG PO TABS: ORAL_TABLET | ORAL | 4 refills | Status: DC

## 2018-06-19 MED ORDER — LITHIUM CARBONATE 600 MG PO CAPS: ORAL_CAPSULE | ORAL | 5 refills | Status: DC

## 2018-06-19 MED ORDER — CLONAZEPAM 1 MG PO TABS: ORAL_TABLET | ORAL | 2 refills | Status: DC

## 2018-06-19 NOTE — Progress Notes (Signed)
Patient ID: Angel [name], male   DOB: Jan 28, 1956, 63 y.o.   MRN: [id number] [facility] MD Progress Note  06/19/2018 3:43 PM Afnan Emberton  MRN:  [id number] Subjective:  Feeling great Principal Problem: Bipolar disorder Past Medical History:  This patient is a longtime patient being treated for bipolar disorder.  Today the patient is actually doing very well.  He is seen with his partner [name].  His only real complaint is fatigue which I suspect is due to a number of problems.  I do think the patient is having some trouble with sleep.  Despite the fact that he gets a good 7 to 8 hours of sleep he still takes naps during the day.  In a close evaluation he actually gets up 2 or 3 times at night often have to walk his dogs.  I think his sleep is likely disrupted.  The patient's appetite is good.  His energy level is good.  He has no problems thinking and concentrating.  He has a good sense of work.  He is not grandiose.  His psychomotor functioning is normal.  He is not suicidal.  He denies racing thinking.  He does nothing dangerous or risky.  In essence he has no evidence of a manic episode and his partner [name] agrees.  The patient is not psychotic.  He drinks no alcohol uses no drugs.  Recently the pharmacist and the doctors are offering him a new medication for his liver and kidney protection was told by to pharmacist that his new medicine would have a problem and interact with Tegretol.  They applied a Tegretol would make this new drug performed less well.  Patient is interested in changing to Tegretol.  And given that his consultants believe would be better if he is on his newer medicine we will go ahead and change his Tegretol.  Today we reviewed his labs.  3 months ago he had labs demonstrated a normal liver and kidney function.  In the last week or 2 he had a lithium blood level came back at 1.1.  This is a meaningful blood level is occurred in the morning before he took his lithium.   Past Medical History:   Diagnosis Date  . Bipolar disorder ([place])   . 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 Depression   . GERD (gastroesophageal reflux disease)   . Headache   . Hepatitis B    Hepatitis E. antigen positive by history, status post treatment with Hepsera and baraclude   . 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 Personal history of colonic polyps    Adenomas polyp 2008 and 2010, 2015   . Weight loss    Pt. has lost 10 lbs since pre-visit, intentionally with diet and walking    Past Surgical History:  Procedure Laterality Date  . APPENDECTOMY    . COLONOSCOPY    . POLYPECTOMY    . UPPER GASTROINTESTINAL ENDOSCOPY     Family History:  Family History  Problem Relation Age of Onset  . Heart disease Mother   . Diabetes Father   . Kidney failure Father   . Kidney disease Father   . Other Sister        GERD  . Colon cancer Neg Hx   . Rectal cancer Neg Hx   . Stomach cancer Neg Hx   . Esophageal cancer Neg Hx    Family Psychiatric  History:  Social History:  Social History   Substance and Sexual  Activity  Alcohol Use No  . Alcohol/week: 0.0 standard drinks     Social History   Substance and Sexual Activity  Drug Use No    Social History   Socioeconomic History  . Marital status: Married    Spouse name: [name]  . Number of children: 0  . Years of education: Not on file  . Highest education level: Master's degree (e.g., MA, MS, MEng, MEd, MSW, MBA)  Occupational History  . 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 Financial resource strain: Not on file  . Food insecurity:    Worry: Not on file    Inability: Not on file  . Transportation needs:    Medical: Not on file    Non-medical: Not on file  Tobacco Use  . Smoking status: Former Smoker    Types: Cigarettes    Last attempt to quit: 05/08/2009    Years since quitting: 9.1  . Smokeless tobacco: Never Used  Substance and Sexual Activity  . Alcohol use: No    Alcohol/week: 0.0 standard  drinks  . Drug use: No  . Sexual activity: Not Currently  Lifestyle  . Physical activity:    Days per week: Not on file    Minutes per session: Not on file  . Stress: Not on file  Relationships  . Social connections:    Talks on phone: Not on file    Gets together: Not on file    Attends religious service: Not on file    Active member of club or organization: Not on file    Attends meetings of clubs or organizations: Not on file    Relationship status: Not on file  Other Topics Concern  . Not on file  Social History Narrative   Patient is right-handed. He is married to same sex partner. He drinks 6-9 glasses of tea a day. He gets little exercise.   Additional Social History:                         Sleep: Good  Appetite:  Good  Current Medications:   Lab Results:  Results for orders placed or performed in visit on 06/18/18 (from the past 48 hour(s))  Lithium level     Status: None   Collection Time: 06/18/18 12:00 AM  Result Value Ref Range   Lithium Lvl 1.1 0.6 - 1.2 mmol/L    Comment:                                  Detection Limit = 0.1                           <0.1 indicates None Detected     Physical Findings: AIMS:  , ,  ,  ,    CIWA:    COWS:     Musculoskeletal: Strength & Muscle Tone: within normal limits Gait & Station: normal Patient leans: N/A  Psychiatric Specialty Exam: [name] is resistant Patient ID: Angel [name], male   DOB: 1955-08-05, 63 y.o.   MRN: [id number] [facility] MD Progress Note  06/19/2018 3:43 PM Kevis Qu  MRN:  [id number] Subjective:  Feeling great Principal Problem: Bipolar disorder, most recent episode depression Today the patient is seen with his partner care. [name] patient is doing well. His mood is stable. He shows  no signs of mania. His sleep is a little erratic but is not really that big of a problem. He's eating well has good energy enjoying life. He uses no alcohol or drugs. His hepatitis B cirrhosis is  stable. Medically he is feeling well. Initially he stable. He's got good relationship with his partner with others around him. There is no evidence of alcohol or drug use in this individual. He takes his medicines just as prescribed. Past Medical History:  Past Medical History:  Diagnosis Date  . Bipolar disorder ([place])   . 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 Depression   . GERD (gastroesophageal reflux disease)   . Headache   . Hepatitis B    Hepatitis E. antigen positive by history, status post treatment with Hepsera and baraclude   . 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 Personal history of colonic polyps    Adenomas polyp 2008 and 2010, 2015   . Weight loss    Pt. has lost 10 lbs since pre-visit, intentionally with diet and walking    Past Surgical History:  Procedure Laterality Date  . APPENDECTOMY    . COLONOSCOPY    . POLYPECTOMY    . UPPER GASTROINTESTINAL ENDOSCOPY     Family History:  Family History  Problem Relation Age of Onset  . Heart disease Mother   . Diabetes Father   . Kidney failure Father   . Kidney disease Father   . Other Sister        GERD  . Colon cancer Neg Hx   . Rectal cancer Neg Hx   . Stomach cancer Neg Hx   . Esophageal cancer Neg Hx    Family Psychiatric  History:  Social History:  Social History   Substance and Sexual Activity  Alcohol Use No  . Alcohol/week: 0.0 standard drinks     Social History   Substance and Sexual Activity  Drug Use No    Social History   Socioeconomic History  . Marital status: Married    Spouse name: [name]  . Number of children: 0  . Years of education: Not on file  . Highest education level: Master's degree (e.g., MA, MS, MEng, MEd, MSW, MBA)  Occupational History  . 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 Financial resource strain: Not on file  . Food insecurity:    Worry: Not on file    Inability: Not on file  . Transportation needs:    Medical: Not on file     Non-medical: Not on file  Tobacco Use  . Smoking status: Former Smoker    Types: Cigarettes    Last attempt to quit: 05/08/2009    Years since quitting: 9.1  . Smokeless tobacco: Never Used  Substance and Sexual Activity  . Alcohol use: No    Alcohol/week: 0.0 standard drinks  . Drug use: No  . Sexual activity: Not Currently  Lifestyle  . Physical activity:    Days per week: Not on file    Minutes per session: Not on file  . Stress: Not on file  Relationships  . Social connections:    Talks on phone: Not on file    Gets together: Not on file    Attends religious service: Not on file    Active member of club or organization: Not on file    Attends meetings of clubs or organizations: Not on file    Relationship status: Not  on file  Other Topics Concern  . Not on file  Social History Narrative   Patient is right-handed. He is married to same sex partner. He drinks 6-9 glasses of tea a day. He gets little exercise.   Additional Social History:                         Sleep: Good  Appetite:  Good  Current Medications:   Lab Results:  Results for orders placed or performed in visit on 06/18/18 (from the past 48 hour(s))  Lithium level     Status: None   Collection Time: 06/18/18 12:00 AM  Result Value Ref Range   Lithium Lvl 1.1 0.6 - 1.2 mmol/L    Comment:                                  Detection Limit = 0.1                           <0.1 indicates None Detected     Physical Findings: AIMS:  , ,  ,  ,    CIWA:    COWS:     Musculoskeletal: Strength & Muscle Tone: within normal limits Gait & Station: normal Patient leans: N/A  Psychiatric Specialty Exam: ROS  Blood pressure 126/74, pulse 70, height 5\' 10"  (1.778 m), weight 218 lb 12.8 oz (99.2 kg).Body mass index is 31.39 kg/m.  General Appearance: Casual  Eye Contact:: Good   Speech:  Clear and Coherent  Volume:  Normal  Mood:  Euthymic  Affect:  NA and Appropriate  Thought Process:   Coherent  Orientation:  Full (Time, Place, and Person)  Thought Content:  WDL  Suicidal Thoughts:  No  Homicidal Thoughts:  No  Memory:  NA  Judgement:  Good  Insight:  Good  Psychomotor Activity:  Normal  Concentration:  Good  Recall:  Good  Fund of Knowledge:Good  Language: Good  Akathisia:  No  Handed:  Right  AIMS (if indicated):     Assets:  Desire for Improvement  ADL's:  Intact  Cognition: WNL  Sleep:      Treatment Plan Summary: 06/19/2018,    Blood pressure 126/74, pulse 70, height 5\' 10"  (1.778 m), weight 218 lb 12.8 oz (99.2 kg).Body mass index is 31.39 kg/m.  General Appearance: Casual  Eye Contact:: Good   Speech:  Clear and Coherent  Volume:  Normal  Mood:  Euthymic  Affect:  NA and Appropriate  Thought Process:  Coherent  Orientation:  Full (Time, Place, and Person)  Thought Content:  WDL  Suicidal Thoughts:  No  Homicidal Thoughts:  No  Memory:  NA  Judgement:  Good  Insight:  Good  Psychomotor Activity:  Normal  Concentration:  Good  Recall:  Good  Fund of Knowledge:Good  Language: Good  Akathisia:  No  Handed:  Right  AIMS (if indicated):     Assets:  Desire for Improvement  ADL's:  Intact  Cognition: WNL  Sleep:      Treatment Plan Summary: 06/19/2018,  Today this patient's first major problem is bipolar disorder.  At this time he is stable.   He will continue taking Effexor.  Today we will discontinue his Tegretol and begin him on Depakote and build to a dose of 750 mg.  A week after to the full  dose he will get a lithium and a Depakote blood level.  His second problem is that of insomnia.  This will be treated by increase of his trazodone to a dose of 300 mg.  This patient is not suicidal.  He denies any abdominal pain.  He denies any chest pain or shortness of breath.  He is actually functioning very well.  He will return to see me in 5 weeks.

## 2018-06-20 MED ORDER — BISACODYL 5 MG PO TBEC: 5.0000 mg | DELAYED_RELEASE_TABLET | Freq: Every day | ORAL | 0 refills | Status: DC | PRN

## 2018-07-10 ENCOUNTER — Other Ambulatory Visit: Payer: Self-pay | Admitting: Medical

## 2018-07-15 ENCOUNTER — Encounter: Payer: Self-pay | Admitting: Medical

## 2018-07-16 ENCOUNTER — Telehealth: Payer: Self-pay | Admitting: Medical

## 2018-07-16 MED ORDER — INULIN-CHOLECALCIFEROL 2.5-500 GM-UNIT PO CHEW: CHEWABLE_TABLET | ORAL | 3 refills | Status: DC

## 2018-07-16 NOTE — Telephone Encounter (Signed)
Rx gummies printed since pt did not specifiy which [facility].

## 2018-07-16 NOTE — Telephone Encounter (Signed)
Rx  gummy sent to pt pharmacy.

## 2018-07-22 DIAGNOSIS — B181 Chronic viral hepatitis B without delta-agent: Principal | ICD-10-CM

## 2018-07-22 DIAGNOSIS — Z79899 Other long term (current) drug therapy: Principal | ICD-10-CM

## 2018-07-22 DIAGNOSIS — K7460 Unspecified cirrhosis of liver: Principal | ICD-10-CM

## 2018-07-22 MED ORDER — BISACODYL 5 MG PO TBEC: DELAYED_RELEASE_TABLET | ORAL | 1 refills | Status: DC

## 2018-07-24 DIAGNOSIS — K7469 Other cirrhosis of liver: Principal | ICD-10-CM

## 2018-07-24 DIAGNOSIS — B181 Chronic viral hepatitis B without delta-agent: Principal | ICD-10-CM

## 2018-07-24 MED ORDER — VEMLIDY 25 MG TABLET
ORAL_TABLET | Freq: Every day | ORAL | 11 refills | 0.00000 days | Status: CP
Start: 2018-07-24 — End: 2018-07-24

## 2018-07-24 MED ORDER — VEMLIDY 25 MG TABLET: 25 mg | tablet | Freq: Every day | 11 refills | 0 days | Status: AC

## 2018-07-31 ENCOUNTER — Ambulatory Visit (HOSPITAL_COMMUNITY): Payer: BLUE CROSS/BLUE SHIELD | Admitting: Psychiatry

## 2018-08-13 ENCOUNTER — Telehealth (HOSPITAL_COMMUNITY): Payer: Self-pay

## 2018-08-13 DIAGNOSIS — F331 Major depressive disorder, recurrent, moderate: Secondary | ICD-10-CM

## 2018-08-13 NOTE — Telephone Encounter (Signed)
Medication refill request - Fax from [facility] with request for a new 90 day order for patient's prescribed Venlafaxine, last ordered 05/07/19. Pt canceled 07/31/18 due to COVID-19 and rescheduled for 10/02/18.

## 2018-08-14 MED ORDER — VENLAFAXINE HCL ER 150 MG PO CP24: 150.0000 mg | ORAL_CAPSULE | Freq: Every morning | ORAL | 0 refills | Status: DC

## 2018-08-14 NOTE — Telephone Encounter (Signed)
A new 90 day order for patient's prescribed Venlafaxine XR 150 mg, one every morning, #90 with no refills e-scribed to patient's [facility] per verbal order from Dr. Casimiro Needle this date.

## 2018-08-26 ENCOUNTER — Encounter: Payer: Self-pay | Admitting: Medical

## 2018-08-27 ENCOUNTER — Encounter: Payer: Self-pay | Admitting: Medical

## 2018-08-28 NOTE — Telephone Encounter (Signed)
Left pt a message to call back and schedule VOV.

## 2018-09-02 ENCOUNTER — Encounter: Payer: Self-pay | Admitting: Medical

## 2018-09-15 ENCOUNTER — Other Ambulatory Visit: Payer: Self-pay | Admitting: Medical

## 2018-09-21 ENCOUNTER — Encounter: Payer: Self-pay | Admitting: Medical

## 2018-09-23 ENCOUNTER — Telehealth: Payer: Self-pay | Admitting: Medical

## 2018-09-23 MED ORDER — BISACODYL 5 MG PO TBEC: DELAYED_RELEASE_TABLET | ORAL | 0 refills | Status: DC

## 2018-09-23 NOTE — Telephone Encounter (Signed)
Rx dulocolax sent to pt pharmacy.

## 2018-09-24 ENCOUNTER — Ambulatory Visit (INDEPENDENT_AMBULATORY_CARE_PROVIDER_SITE_OTHER): Payer: BLUE CROSS/BLUE SHIELD | Admitting: Medical

## 2018-09-24 ENCOUNTER — Encounter: Payer: Self-pay | Admitting: Medical

## 2018-09-24 ENCOUNTER — Other Ambulatory Visit: Payer: Self-pay

## 2018-09-24 VITALS — BP 119/72 | HR 71

## 2018-09-24 DIAGNOSIS — K5900 Constipation, unspecified: Secondary | ICD-10-CM

## 2018-09-24 DIAGNOSIS — G4700 Insomnia, unspecified: Secondary | ICD-10-CM

## 2018-09-24 DIAGNOSIS — F319 Bipolar disorder, unspecified: Secondary | ICD-10-CM

## 2018-09-24 DIAGNOSIS — M542 Cervicalgia: Secondary | ICD-10-CM | POA: Diagnosis not present

## 2018-09-24 NOTE — Progress Notes (Signed)
   Subjective:    Patient ID: Angel [name], male    DOB: [birth year], 63 y.o.   MRN: [id number]  HPI  Virtual Visit via Video Note  I connected with Angel [name] on 09/24/18 at  2:40 PM EDT by a video enabled telemedicine application and verified that I am speaking with the correct person using two identifiers.  Location: Patient: home Provider: home   I discussed the limitations of evaluation and management by telemedicine and the availability of in person appointments. The patient expressed understanding and agreed to proceed.   History of Present Illness: Pt states he is feeling overall well.  Pt has history of neck pain. He uses methacarbamol at night. 500 mg hs.   He has insomnia  past that is severe worsened in past with bipolar.    Pt also uses clonazepam 2 mg at night.   He also is on trazadone 150 mg hs.   Also takes melatonin q hs.  Pt never slept well in the past.  He complains that in morning he feels lethargic. Eventually he will wake up and feel alert.     Observations/Objective: General- no acute distress. Lungs- even and unlabored. Neuro-gross motor function appears intact.     Assessment and Plan: For bipolar continue with meds rx'd by psychiatrist.  For insomnia can continue with meds rx'd by psychiatrist as well.  For neck pain can continue muscle relaxant and can add 1/2 tab of methocarbinol if needed at night.  Discussed lethargy in am being side effect of meds rx'd by psychiatrist. Could offset some side effects with caffeine beverages but make sure blood pressure not increasing.  For constipation refilled dulcolax.  Follow up end of august and recommend fast so can get labs.  Follow Up Instructions:    I discussed the assessment and treatment plan with the patient. The patient was provided an opportunity to ask questions and all were answered. The patient agreed with the plan and demonstrated an understanding of the instructions.   The patient was advised to call back or seek an in-person evaluation if the symptoms worsen or if the condition fails to improve as anticipated.  25+ minutes spent with pt. 50% spent counseling on plan going forward and answering pt questions.     [name], PA-C    Review of Systems  Constitutional: Negative for chills, fatigue and fever.  HENT: Negative for congestion and drooling.   Respiratory: Negative for chest tightness and shortness of breath.   Cardiovascular: Negative for chest pain and palpitations.  Gastrointestinal: Negative for abdominal pain, diarrhea and nausea.  Musculoskeletal: Negative for back pain.  Neurological: Negative for dizziness, seizures, light-headedness and headaches.  Hematological: Negative for adenopathy. Does not bruise/bleed easily.  Psychiatric/Behavioral: Positive for behavioral problems and sleep disturbance. Negative for confusion, dysphoric mood and suicidal ideas. The patient is not nervous/anxious.        Objective:   Physical Exam        Assessment & Plan:

## 2018-09-24 NOTE — Patient Instructions (Signed)
For bipolar continue with meds rx'd by psychiatrist.  For insomnia can continue with meds rx'd by psychiatrist as well.  For neck pain can continue muscle relaxant and can add 1/2 tab of methocarbinol if needed at night.  Discussed lethargy in am being side effect of meds rx'd by psychiatrist. Could offset some side effects with caffeine beverages but make sure blood pressure not increasing.  For constipation refilled dulcolax.  Follow up end of august and recommend fast so can get labs.

## 2018-09-27 ENCOUNTER — Other Ambulatory Visit: Payer: Self-pay | Admitting: Medical

## 2018-10-01 NOTE — Telephone Encounter (Signed)
Rx robaxin sent to pharmacy.

## 2018-10-02 ENCOUNTER — Ambulatory Visit (INDEPENDENT_AMBULATORY_CARE_PROVIDER_SITE_OTHER): Payer: BLUE CROSS/BLUE SHIELD | Admitting: Psychiatry

## 2018-10-02 ENCOUNTER — Other Ambulatory Visit: Payer: Self-pay

## 2018-10-02 DIAGNOSIS — F3162 Bipolar disorder, current episode mixed, moderate: Secondary | ICD-10-CM

## 2018-10-02 DIAGNOSIS — F330 Major depressive disorder, recurrent, mild: Secondary | ICD-10-CM | POA: Diagnosis not present

## 2018-10-02 MED ORDER — CLONAZEPAM 1 MG PO TABS: ORAL_TABLET | ORAL | 4 refills | Status: DC

## 2018-10-02 NOTE — Progress Notes (Signed)
Patient ID: Angel [name], male   DOB: Jul 05, 1955, 63 y.o.   MRN: [id number] [facility] MD Progress Note  10/02/2018 1:53 PM Angel [name]  MRN:  [id number] Subjective:  Feeling great Principal Problem: Bipolar disorder Past Medical History:  Today the patient is actually doing well.  Was a conference call with Angel [name] his partner.  The patient's mood is even and stable.  He denies depression.  He denies irritability.  There is no evidence of grandiosity.  The patient's mood is stable despite the virus.  He is very cautious about the pandemic.  He is sleeping and eating well and has good energy.  He has no problems thinking and concentrating.  He has no racing thinking.  He has no psychotic symptomatology at all.  Physically he feels well.  He denies chest pain or shortness of breath or any respiratory symptoms at all.  In his last visit we discontinued his Tegretol and started him on Depakote in its place.  Unfortunately he was unable to get blood work the following week but will plan to get that blood work in the next 2 weeks.  We will get a Depakote level and a lithium level at a comprehensive metabolic panel.  At this time he shows no dizziness.  He is have not having any problems walking.  His vision is great.  He essentially shows no evidence of toxic signs from Depakote or lithium.  I do not think he has toxic amounts.  His mood is stable and generally physically he feels well.  He has no nausea or vomiting.  Medically he is quite stable.  He does have some neck pain and he takes some Soma for this at night.   Past Medical History:  Diagnosis Date  . Bipolar disorder ([place])   . 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 Depression   . GERD (gastroesophageal reflux disease)   . Headache   . Hepatitis B    Hepatitis E. antigen positive by history, status post treatment with Hepsera and baraclude   . 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 Personal history of colonic polyps    Adenomas polyp 2008 and  2010, 2015   . Weight loss    Pt. has lost 10 lbs since pre-visit, intentionally with diet and walking    Past Surgical History:  Procedure Laterality Date  . APPENDECTOMY    . COLONOSCOPY    . POLYPECTOMY    . UPPER GASTROINTESTINAL ENDOSCOPY     Family History:  Family History  Problem Relation Age of Onset  . Heart disease Mother   . Diabetes Father   . Kidney failure Father   . Kidney disease Father   . Other Sister        GERD  . Colon cancer Neg Hx   . Rectal cancer Neg Hx   . Stomach cancer Neg Hx   . Esophageal cancer Neg Hx    Family Psychiatric  History:  Social History:  Social History   Substance and Sexual Activity  Alcohol Use No  . Alcohol/week: 0.0 standard drinks     Social History   Substance and Sexual Activity  Drug Use No    Social History   Socioeconomic History  . Marital status: Married    Spouse name: [name]  . Number of children: 0  . Years of education: Not on file  . Highest education level: Master's degree (e.g., MA, MS, MEng, MEd, MSW, MBA)  Occupational History  .  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 Financial resource strain: Not on file  . Food insecurity:    Worry: Not on file    Inability: Not on file  . Transportation needs:    Medical: Not on file    Non-medical: Not on file  Tobacco Use  . Smoking status: Former Smoker    Types: Cigarettes    Last attempt to quit: 05/08/2009    Years since quitting: 9.4  . Smokeless tobacco: Never Used  Substance and Sexual Activity  . Alcohol use: No    Alcohol/week: 0.0 standard drinks  . Drug use: No  . Sexual activity: Not Currently  Lifestyle  . Physical activity:    Days per week: Not on file    Minutes per session: Not on file  . Stress: Not on file  Relationships  . Social connections:    Talks on phone: Not on file    Gets together: Not on file    Attends religious service: Not on file    Active member of club or organization: Not  on file    Attends meetings of clubs or organizations: Not on file    Relationship status: Not on file  Other Topics Concern  . Not on file  Social History Narrative   Patient is right-handed. He is married to same sex partner. He drinks 6-9 glasses of tea a day. He gets little exercise.   Additional Social History:                         Sleep: Good  Appetite:  Good  Current Medications:   Lab Results:  No results found for this or any previous visit (from the past 48 hour(s)).  Physical Findings: AIMS:  , ,  ,  ,    CIWA:    COWS:     Musculoskeletal: Strength & Muscle Tone: within normal limits Gait & Station: normal Patient leans: N/A  Psychiatric Specialty Exam: [name] is resistant Patient ID: Angel [name], male   DOB: February 20, 1956, 63 y.o.   MRN: [id number] [facility] MD Progress Note  10/02/2018 1:53 PM Angel [name]  MRN:  [id number] Subjective:  Feeling great Principal Problem: Bipolar disorder, most recent episode depression Today the patient is seen with his partner care. [name] patient is doing well. His mood is stable. He shows no signs of mania. His sleep is a little erratic but is not really that big of a problem. He's eating well has good energy enjoying life. He uses no alcohol or drugs. His hepatitis B cirrhosis is stable. Medically he is feeling well. Initially he stable. He's got good relationship with his partner with others around him. There is no evidence of alcohol or drug use in this individual. He takes his medicines just as prescribed. Past Medical History:  Past Medical History:  Diagnosis Date  . Bipolar disorder ([place])   . 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 Depression   . GERD (gastroesophageal reflux disease)   . Headache   . Hepatitis B    Hepatitis E. antigen positive by history, status post treatment with Hepsera and baraclude   . 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 Personal history of colonic polyps    Adenomas polyp  2008 and 2010, 2015   . Weight loss    Pt. has lost 10 lbs since pre-visit, intentionally with diet and walking  Past Surgical History:  Procedure Laterality Date  . APPENDECTOMY    . COLONOSCOPY    . POLYPECTOMY    . UPPER GASTROINTESTINAL ENDOSCOPY     Family History:  Family History  Problem Relation Age of Onset  . Heart disease Mother   . Diabetes Father   . Kidney failure Father   . Kidney disease Father   . Other Sister        GERD  . Colon cancer Neg Hx   . Rectal cancer Neg Hx   . Stomach cancer Neg Hx   . Esophageal cancer Neg Hx    Family Psychiatric  History:  Social History:  Social History   Substance and Sexual Activity  Alcohol Use No  . Alcohol/week: 0.0 standard drinks     Social History   Substance and Sexual Activity  Drug Use No    Social History   Socioeconomic History  . Marital status: Married    Spouse name: [name]  . Number of children: 0  . Years of education: Not on file  . Highest education level: Master's degree (e.g., MA, MS, MEng, MEd, MSW, MBA)  Occupational History  . 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 Financial resource strain: Not on file  . Food insecurity:    Worry: Not on file    Inability: Not on file  . Transportation needs:    Medical: Not on file    Non-medical: Not on file  Tobacco Use  . Smoking status: Former Smoker    Types: Cigarettes    Last attempt to quit: 05/08/2009    Years since quitting: 9.4  . Smokeless tobacco: Never Used  Substance and Sexual Activity  . Alcohol use: No    Alcohol/week: 0.0 standard drinks  . Drug use: No  . Sexual activity: Not Currently  Lifestyle  . Physical activity:    Days per week: Not on file    Minutes per session: Not on file  . Stress: Not on file  Relationships  . Social connections:    Talks on phone: Not on file    Gets together: Not on file    Attends religious service: Not on file    Active member of club or  organization: Not on file    Attends meetings of clubs or organizations: Not on file    Relationship status: Not on file  Other Topics Concern  . Not on file  Social History Narrative   Patient is right-handed. He is married to same sex partner. He drinks 6-9 glasses of tea a day. He gets little exercise.   Additional Social History:                         Sleep: Good  Appetite:  Good  Current Medications:   Lab Results:  No results found for this or any previous visit (from the past 48 hour(s)).  Physical Findings: AIMS:  , ,  ,  ,    CIWA:    COWS:     Musculoskeletal: Strength & Muscle Tone: within normal limits Gait & Station: normal Patient leans: N/A  Psychiatric Specialty Exam: ROS  There were no vitals taken for this visit.There is no height or weight on file to calculate BMI.  General Appearance: Casual  Eye Contact:: Good   Speech:  Clear and Coherent  Volume:  Normal  Mood:  Euthymic  Affect:  NA and  Appropriate  Thought Process:  Coherent  Orientation:  Full (Time, Place, and Person)  Thought Content:  WDL  Suicidal Thoughts:  No  Homicidal Thoughts:  No  Memory:  NA  Judgement:  Good  Insight:  Good  Psychomotor Activity:  Normal  Concentration:  Good  Recall:  Good  Fund of Knowledge:Good  Language: Good  Akathisia:  No  Handed:  Right  AIMS (if indicated):     Assets:  Desire for Improvement  ADL's:  Intact  Cognition: WNL  Sleep:      Treatment Plan Summary: 10/02/2018,    There were no vitals taken for this visit.There is no height or weight on file to calculate BMI.  General Appearance: Casual  Eye Contact:: Good   Speech:  Clear and Coherent  Volume:  Normal  Mood:  Euthymic  Affect:  NA and Appropriate  Thought Process:  Coherent  Orientation:  Full (Time, Place, and Person)  Thought Content:  WDL  Suicidal Thoughts:  No  Homicidal Thoughts:  No  Memory:  NA  Judgement:  Good  Insight:  Good  Psychomotor  Activity:  Normal  Concentration:  Good  Recall:  Good  Fund of Knowledge:Good  Language: Good  Akathisia:  No  Handed:  Right  AIMS (if indicated):     Assets:  Desire for Improvement  ADL's:  Intact  Cognition: WNL  Sleep:      Treatment Plan Summary: 10/02/2018,  Today the patient is doing well.  His first and only major problem is bipolar disorder.  At this time he will continue taking lithium and Depakote for this condition.  His second problem is insomnia.  The patient takes 300 mg of trazodone and will continue on this agent.  The patient is functioning well.  He denies fatigue.  He will return to see me in the next 2 to 3 months.  He is not suicidal and again has no significant mood symptomatology at this time.  The patient will also continue taking Effexor and Klonopin.

## 2018-10-08 IMAGING — MR MR CERVICAL SPINE W/O CM
5 of 6 series · 30 of 48 positions shown · non-contrast
Comparison: None available.

CLINICAL DATA: Initial evaluation for neck pain for 1 month, right
greater than left.

EXAM:
MRI CERVICAL SPINE WITHOUT CONTRAST
TECHNIQUE: Multiplanar, multisequence MR imaging of the cervical spine was
performed. No intravenous contrast was administered.

[Series 2: (id) tse sag · sagittal · 3.0mm · 0.41mm/px · 6 of 13 slices shown (1 of 2)]
[im 1/13]
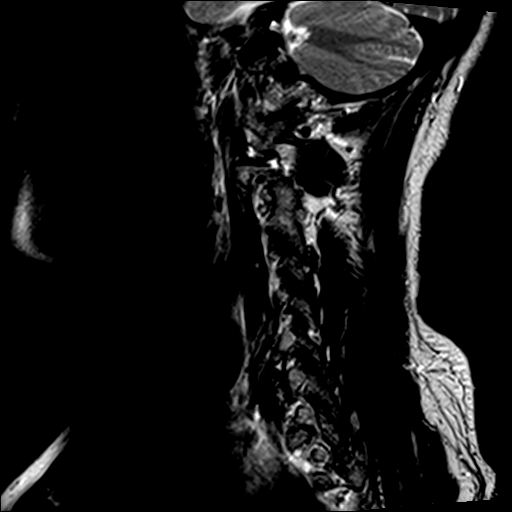
[im 3/13]
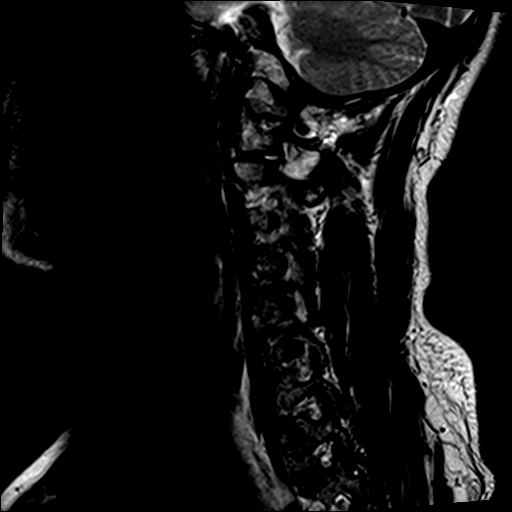
[im 5/13]
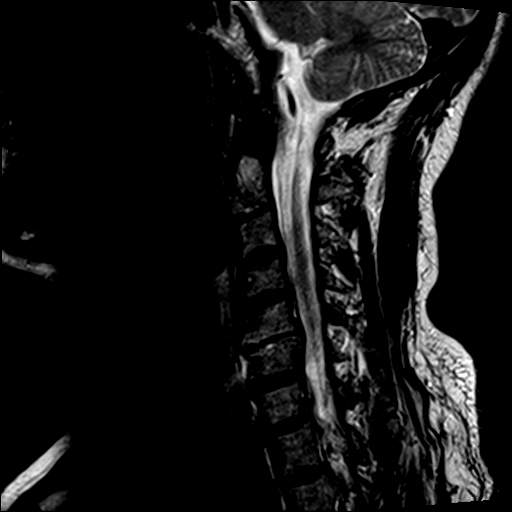
[im 8/13]
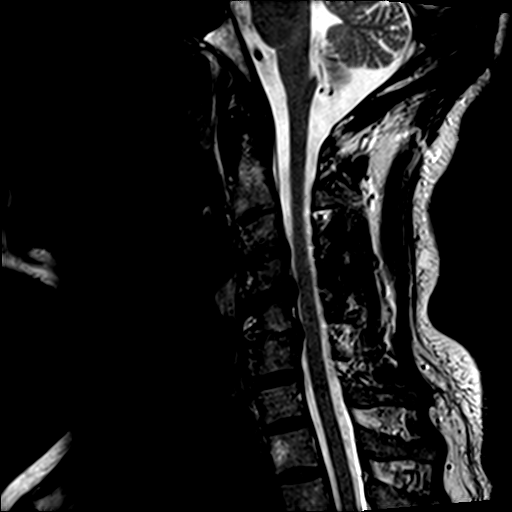
[im 10/13]
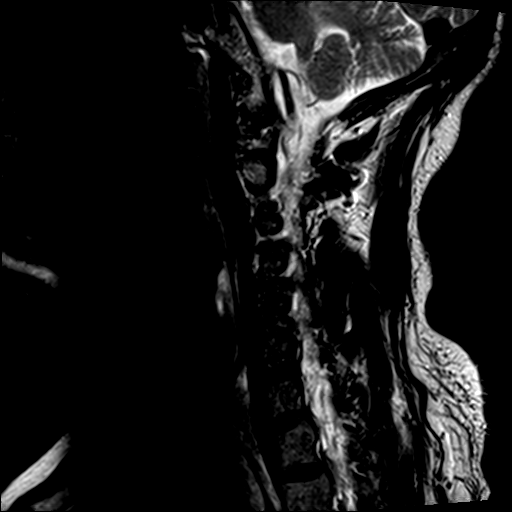
[im 13/13]
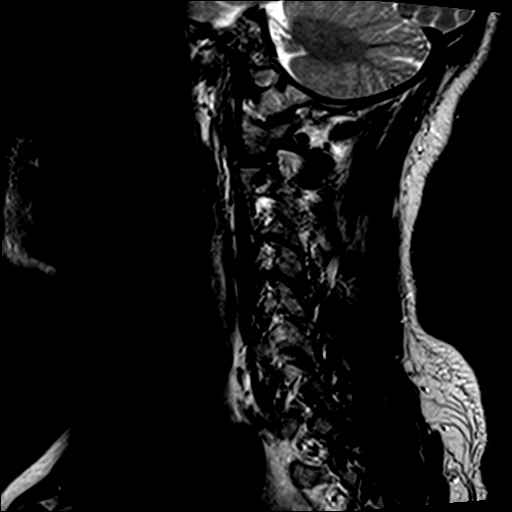

[Series 3: (id) tse sag · sagittal · 3.0mm · 0.41mm/px · 1 of 13 slices shown (2 of 2)]
[im 1/13]
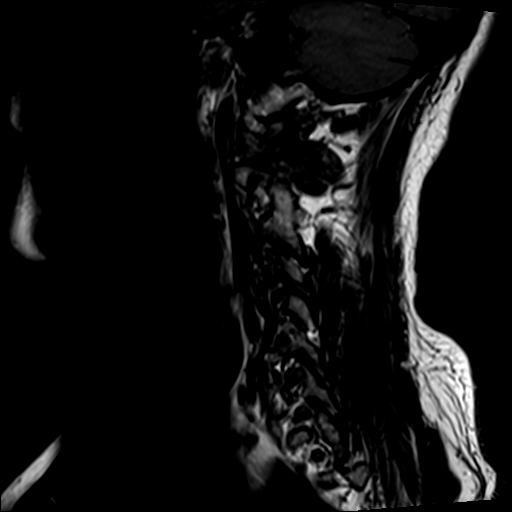

[Series 4: STIR · sagittal · 3.0mm · 0.82mm/px · 5 of 13 slices shown]
[im 1/13]
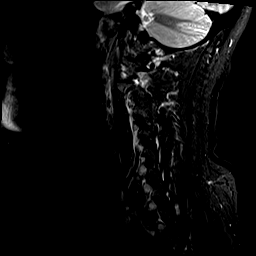
[im 4/13]
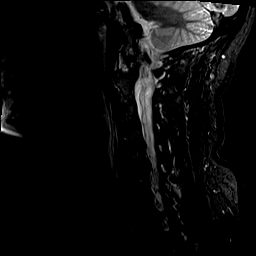
[im 7/13]
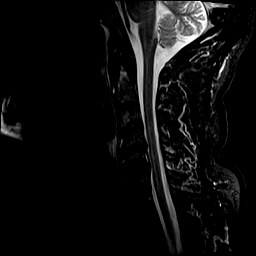
[im 10/13]
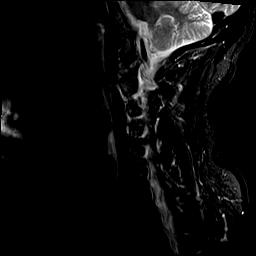
[im 13/13]
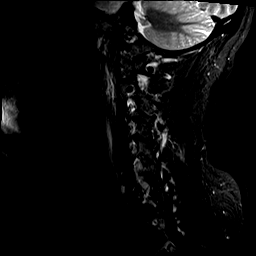

[Series 5: T2 · axial · 3.0mm · 0.39mm/px · z∈[-86,+16]mm · 9 of 32 slices shown (1 of 2)]
[im 1/32]
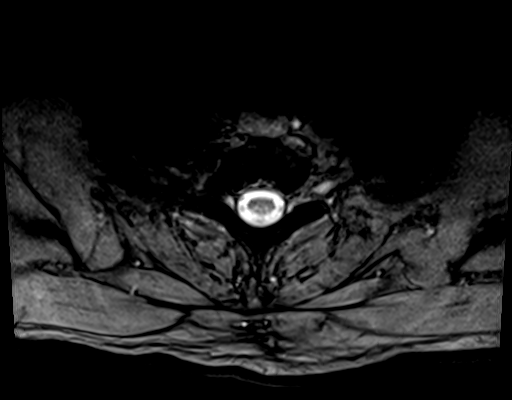
[im 6/32]
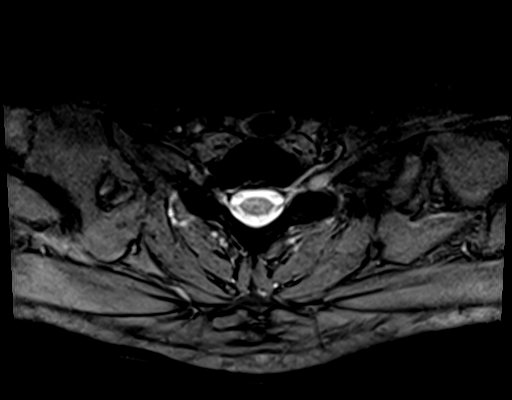
[im 11/32]
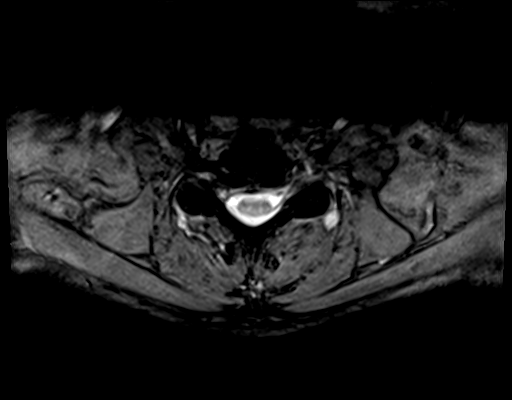
[im 13/32]
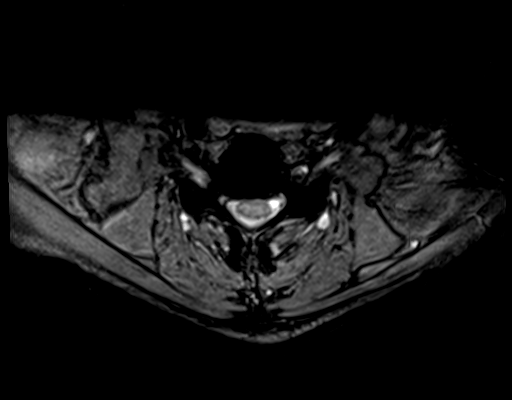
[im 16/32]
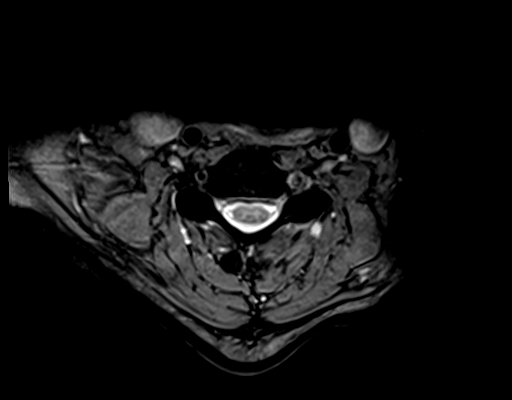
[im 19/32]
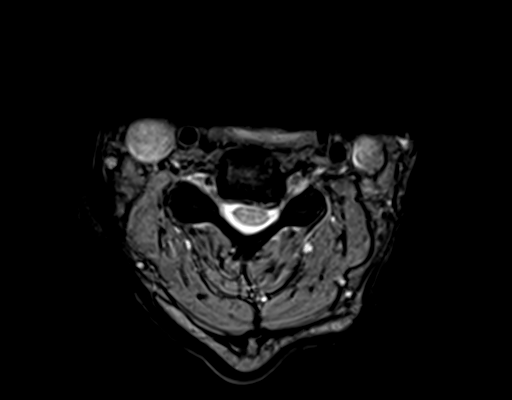
[im 21/32]
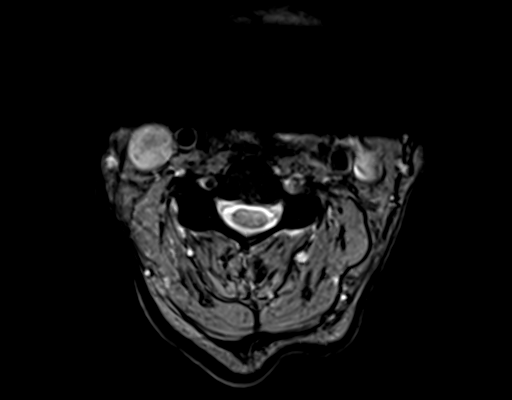
[im 26/32]
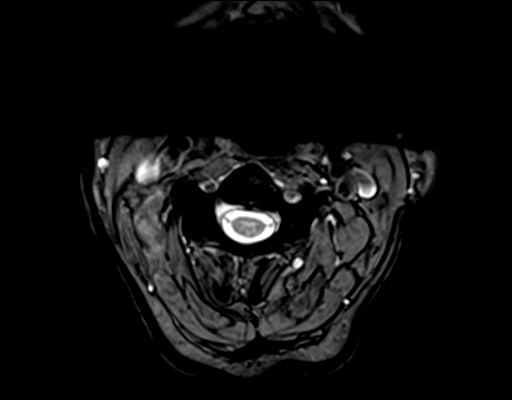
[im 32/32]
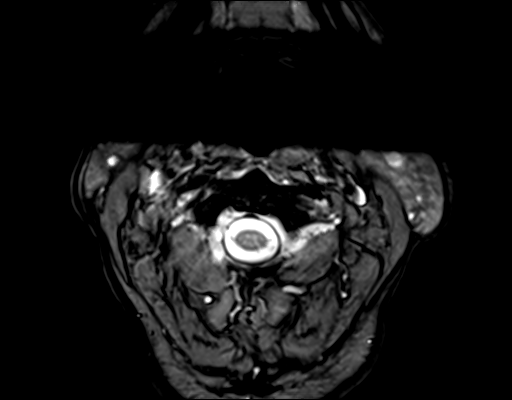

[Series 6: T2 · axial · 3.0mm · 0.78mm/px · z∈[-95,+7]mm · 9 of 32 slices shown (2 of 2)]
[im 1/32]
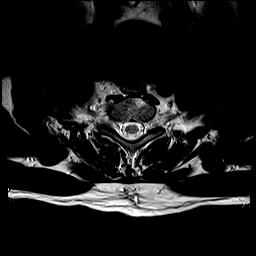
[im 6/32]
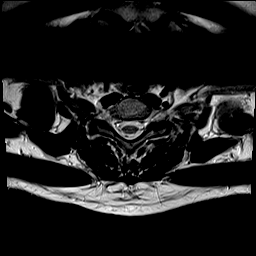
[im 11/32]
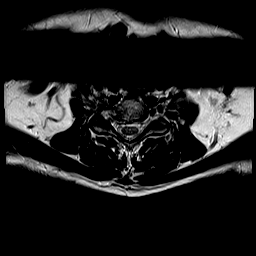
[im 13/32]
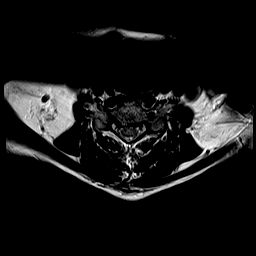
[im 16/32]
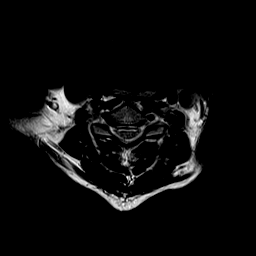
[im 19/32]
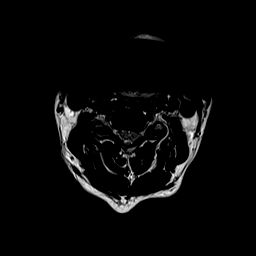
[im 21/32]
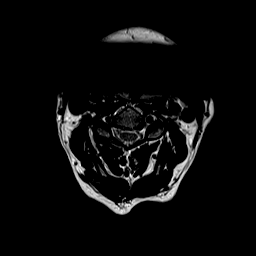
[im 26/32]
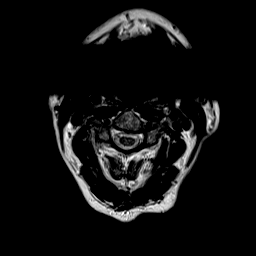
[im 32/32]
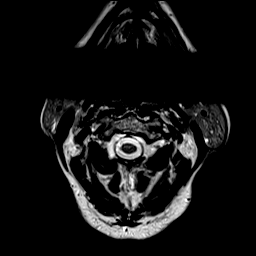

[30 of 48 positions shown; findings below may reference images not displayed]

FINDINGS: Alignment: Straightening of the normal cervical lordosis. No
listhesis.

Vertebrae: Vertebral body heights are maintained without evidence
for acute or chronic fracture. Bone marrow signal intensity within
normal limits. Benign hemangioma noted within the T1 vertebral body.
Prominent reactive endplate changes present about the C5-6
interspace. No worrisome osseous lesions.

Cord: Signal intensity within the cervical spinal cord is normal.

Posterior Fossa, vertebral arteries, paraspinal tissues: Visualized
brain and posterior fossa within normal limits. Craniocervical
junction normal. Paraspinous and prevertebral soft tissues normal.
Normal intravascular flow voids present within the vertebral
arteries bilaterally.

Disc levels:

C2-C3: Mild uncovertebral hypertrophy. Right C2-3 facets appear
fused. No significant stenosis.

C3-C4: Left eccentric disc bulge with left greater than right
uncovertebral hypertrophy. Right greater than left facet
hypertrophy. No significant spinal stenosis. Mild bilateral C4
foraminal narrowing.

C4-C5: Diffuse disc bulge with bilateral uncovertebral hypertrophy.
Posterior disc osteophyte flattens the ventral thecal sac results in
mild spinal stenosis. Moderate left with mild right C5 foraminal
narrowing.

C5-C6: Chronic circumferential disc osteophyte complex with
intervertebral disc space narrowing. Broad posterior component
flattens the ventral thecal sac with mild spinal stenosis. Moderate
to severe right with moderate left C6 foraminal narrowing.

C6-C7: Mild disc bulge with uncovertebral hypertrophy. No
significant stenosis.

C7-T1:  Minimal facet hypertrophy on the left.  No stenosis.

Visualized upper thoracic spine within normal limits.
IMPRESSION: 1. Degenerative disc osteophyte at C4-5 and C5-6 with resultant mild
spinal stenosis.
2. Multifactorial degenerative changes with resultant multilevel
foraminal narrowing as above. Notable findings include moderate left
C5 foraminal stenosis, with moderate to severe right and moderate
left C6 foraminal narrowing.

## 2018-10-26 ENCOUNTER — Encounter: Payer: Self-pay | Admitting: Medical

## 2018-10-31 ENCOUNTER — Encounter: Admit: 2018-10-31 | Discharge: 2018-10-31 | Payer: MEDICARE

## 2018-10-31 DIAGNOSIS — K7469 Other cirrhosis of liver: Secondary | ICD-10-CM

## 2018-10-31 DIAGNOSIS — R160 Hepatomegaly, not elsewhere classified: Principal | ICD-10-CM

## 2018-10-31 DIAGNOSIS — M899 Disorder of bone, unspecified: Principal | ICD-10-CM

## 2018-10-31 DIAGNOSIS — K7460 Unspecified cirrhosis of liver: Secondary | ICD-10-CM | POA: Diagnosis not present

## 2018-10-31 DIAGNOSIS — K766 Portal hypertension: Secondary | ICD-10-CM | POA: Diagnosis not present

## 2018-11-06 ENCOUNTER — Other Ambulatory Visit (HOSPITAL_COMMUNITY): Payer: Self-pay

## 2018-11-06 ENCOUNTER — Encounter: Admit: 2018-11-06 | Discharge: 2018-11-07 | Payer: MEDICARE | Attending: Gastroenterology | Primary: Gastroenterology

## 2018-11-06 DIAGNOSIS — K769 Liver disease, unspecified: Secondary | ICD-10-CM

## 2018-11-06 DIAGNOSIS — K7469 Other cirrhosis of liver: Principal | ICD-10-CM

## 2018-11-06 DIAGNOSIS — B181 Chronic viral hepatitis B without delta-agent: Secondary | ICD-10-CM

## 2018-11-13 ENCOUNTER — Other Ambulatory Visit (HOSPITAL_COMMUNITY): Payer: Self-pay | Admitting: Psychiatry

## 2018-11-13 DIAGNOSIS — F331 Major depressive disorder, recurrent, moderate: Secondary | ICD-10-CM

## 2018-11-20 ENCOUNTER — Other Ambulatory Visit (HOSPITAL_COMMUNITY): Payer: Self-pay | Admitting: Psychiatry

## 2018-11-20 DIAGNOSIS — F331 Major depressive disorder, recurrent, moderate: Secondary | ICD-10-CM

## 2018-12-01 ENCOUNTER — Encounter: Payer: Self-pay | Admitting: Medical

## 2018-12-02 ENCOUNTER — Telehealth: Payer: Self-pay | Admitting: Medical

## 2018-12-02 MED ORDER — METHOCARBAMOL 750 MG PO TABS: 750.0000 mg | ORAL_TABLET | Freq: Three times a day (TID) | ORAL | 3 refills | Status: DC | PRN

## 2018-12-02 NOTE — Telephone Encounter (Signed)
Rx robaxin sent to pt pharmacy. 

## 2018-12-04 ENCOUNTER — Other Ambulatory Visit (HOSPITAL_COMMUNITY): Payer: Self-pay

## 2018-12-04 DIAGNOSIS — Z79899 Other long term (current) drug therapy: Secondary | ICD-10-CM | POA: Diagnosis not present

## 2018-12-04 DIAGNOSIS — B181 Chronic viral hepatitis B without delta-agent: Secondary | ICD-10-CM | POA: Diagnosis not present

## 2018-12-04 DIAGNOSIS — K7460 Unspecified cirrhosis of liver: Secondary | ICD-10-CM | POA: Diagnosis not present

## 2018-12-06 LAB — LITHIUM LEVEL: Lithium Lvl: 0.8 mmol/L (ref 0.6–1.2)

## 2018-12-06 LAB — VALPROIC ACID LEVEL: Valproic Acid Lvl: 32 ug/mL — ABNORMAL LOW (ref 50–100)

## 2018-12-10 ENCOUNTER — Other Ambulatory Visit: Payer: Self-pay | Admitting: Medical

## 2018-12-11 ENCOUNTER — Ambulatory Visit (INDEPENDENT_AMBULATORY_CARE_PROVIDER_SITE_OTHER): Payer: BC Managed Care – PPO | Admitting: Psychiatry

## 2018-12-11 ENCOUNTER — Other Ambulatory Visit: Payer: Self-pay

## 2018-12-11 ENCOUNTER — Encounter (HOSPITAL_COMMUNITY): Payer: Self-pay | Admitting: Psychiatry

## 2018-12-11 ENCOUNTER — Other Ambulatory Visit (HOSPITAL_COMMUNITY): Payer: Self-pay | Admitting: Psychiatry

## 2018-12-11 DIAGNOSIS — F331 Major depressive disorder, recurrent, moderate: Secondary | ICD-10-CM | POA: Diagnosis not present

## 2018-12-11 DIAGNOSIS — F330 Major depressive disorder, recurrent, mild: Secondary | ICD-10-CM | POA: Diagnosis not present

## 2018-12-11 DIAGNOSIS — F3130 Bipolar disorder, current episode depressed, mild or moderate severity, unspecified: Secondary | ICD-10-CM

## 2018-12-11 MED ORDER — DIVALPROEX SODIUM ER 250 MG PO TB24: ORAL_TABLET | ORAL | 5 refills | Status: DC

## 2018-12-11 MED ORDER — LITHIUM CARBONATE 600 MG PO CAPS: ORAL_CAPSULE | ORAL | 5 refills | Status: DC

## 2018-12-11 MED ORDER — TRAZODONE HCL 50 MG PO TABS: ORAL_TABLET | ORAL | 12 refills | Status: DC

## 2018-12-11 MED ORDER — VENLAFAXINE HCL ER 150 MG PO CP24: 150.0000 mg | ORAL_CAPSULE | Freq: Every morning | ORAL | 0 refills | Status: DC

## 2018-12-11 MED ORDER — CLONAZEPAM 1 MG PO TABS: ORAL_TABLET | ORAL | 5 refills | Status: DC

## 2018-12-11 NOTE — Progress Notes (Signed)
Angel [name] ID: Angel Angel [name], male   DOB: 1955-09-25, 63 y.o.   MRN: [id number] [name] MD Progress Note  12/11/2018 3:51 PM Angel Angel [name]  MRN:  [id number] Subjective:  Feeling great Principal Problem: Bipolar disorder Past Medical History:  Today the Angel [name] is seen with his partner Angel Angel [name] on the phone.  The Angel [name] actually is doing very well.  His mood is good.  He denies being depressed irritable or euphoric.  Actually sleeping very well.  He takes trazodone for sleep.  He is eating well.  He is having a hard time staying active because it is too hot for him to walk.  They do have 3 dogs that brings him a lot of joy.  The Angel [name] still reads a lot.  Angel [name] no longer has any pain.  He is never been psychotic.  There is no evidence of alcohol or drugs.  Angel [name] denies chest pain shortness of breath or any neurological symptoms.  He is positive and optimistic.  Recently had a lithium blood level that was 0.8 and a Depakote level that was 32.  These are certainly acceptable for him.  The Angel [name] is happy with life.  He wears a mask appropriately.  Past Medical History:  Diagnosis Date  . Bipolar disorder ([place])   . 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 Depression   . GERD (gastroesophageal reflux disease)   . Headache   . Hepatitis B    Hepatitis E. antigen positive by history, status post treatment with Hepsera and baraclude   . 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 Personal history of colonic polyps    Adenomas polyp 2008 and 2010, 2015   . Weight loss    Pt. has lost 10 lbs since pre-visit, intentionally with diet and walking    Past Surgical History:  Procedure Laterality Date  . APPENDECTOMY    . COLONOSCOPY    . POLYPECTOMY    . UPPER GASTROINTESTINAL ENDOSCOPY     Family History:  Family History  Problem Relation Age of Onset  . Heart disease Mother   . Diabetes Father   . Kidney failure Father   . Kidney disease Father   . Other Sister        GERD  . Colon cancer Neg Hx    . Rectal cancer Neg Hx   . Stomach cancer Neg Hx   . Esophageal cancer Neg Hx    Family Psychiatric  History:  Social History:  Social History   Substance and Sexual Activity  Alcohol Use No  . Alcohol/week: 0.0 standard drinks     Social History   Substance and Sexual Activity  Drug Use No    Social History   Socioeconomic History  . Marital status: Married    Spouse name: Angel Angel [name]  . Number of children: 0  . Years of education: Not on file  . Highest education level: Master's degree (e.g., MA, MS, MEng, MEd, MSW, MBA)  Occupational History  . 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 Financial resource strain: Not on file  . Food insecurity    Worry: Not on file    Inability: Not on file  . Transportation needs    Medical: Not on file    Non-medical: Not on file  Tobacco Use  . Smoking status: Former Smoker    Types: Cigarettes    Quit date: 05/08/2009    Years since quitting: 9.6  . Smokeless tobacco:  Never Used  Substance and Sexual Activity  . Alcohol use: No    Alcohol/week: 0.0 standard drinks  . Drug use: No  . Sexual activity: Not Currently  Lifestyle  . Physical activity    Days per week: Not on file    Minutes per session: Not on file  . Stress: Not on file  Relationships  . Social Herbalist on phone: Not on file    Gets together: Not on file    Attends religious service: Not on file    Active member of club or organization: Not on file    Attends meetings of clubs or organizations: Not on file    Relationship status: Not on file  Other Topics Concern  . Not on file  Social History Narrative   Angel [name] is right-handed. He is married to same sex partner. He drinks 6-9 glasses of tea a day. He gets little exercise.   Additional Social History:                         Sleep: Good  Appetite:  Good  Current Medications:   Lab Results:  No results found for this or any previous visit (from the  past 48 hour(s)).  Physical Findings: AIMS:  , ,  ,  ,    CIWA:    COWS:     Musculoskeletal: Strength & Muscle Tone: within normal limits Gait & Station: normal Angel [name] leans: N/A  Psychiatric Specialty Exam: Angel Angel [name] is resistant Angel [name] ID: Angel Angel [name], male   DOB: December 17, 1955, 63 y.o.   MRN: [id number] [facility] MD Progress Note  12/11/2018 3:51 PM Angel Angel [name]  MRN:  [id number] Subjective:  Feeling great Principal Problem: Bipolar disorder, most recent episode depression Today the Angel [name] is seen with his partner care. Angel Angel [name] is doing well. His mood is stable. He shows no signs of mania. His sleep is a little erratic but is not really that big of a problem. He's eating well has good energy enjoying life. He uses no alcohol or drugs. His hepatitis B cirrhosis is stable. Medically he is feeling well. Initially he stable. He's got good relationship with his partner with others around him. There is no evidence of alcohol or drug use in this individual. He takes his medicines just as prescribed. Past Medical History:  Past Medical History:  Diagnosis Date  . Bipolar disorder ([place])   . 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 Depression   . GERD (gastroesophageal reflux disease)   . Headache   . Hepatitis B    Hepatitis E. antigen positive by history, status post treatment with Hepsera and baraclude   . 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 Personal history of colonic polyps    Adenomas polyp 2008 and 2010, 2015   . Weight loss    Pt. has lost 10 lbs since pre-visit, intentionally with diet and walking    Past Surgical History:  Procedure Laterality Date  . APPENDECTOMY    . COLONOSCOPY    . POLYPECTOMY    . UPPER GASTROINTESTINAL ENDOSCOPY     Family History:  Family History  Problem Relation Age of Onset  . Heart disease Mother   . Diabetes Father   . Kidney failure Father   . Kidney disease Father   . Other Sister        GERD  . Colon cancer Neg Hx   .  Rectal cancer  Neg Hx   . Stomach cancer Neg Hx   . Esophageal cancer Neg Hx    Family Psychiatric  History:  Social History:  Social History   Substance and Sexual Activity  Alcohol Use No  . Alcohol/week: 0.0 standard drinks     Social History   Substance and Sexual Activity  Drug Use No    Social History   Socioeconomic History  . Marital status: Married    Spouse name: Angel Angel [name]  . Number of children: 0  . Years of education: Not on file  . Highest education level: Master's degree (e.g., MA, MS, MEng, MEd, MSW, MBA)  Occupational History  . 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 Financial resource strain: Not on file  . Food insecurity    Worry: Not on file    Inability: Not on file  . Transportation needs    Medical: Not on file    Non-medical: Not on file  Tobacco Use  . Smoking status: Former Smoker    Types: Cigarettes    Quit date: 05/08/2009    Years since quitting: 9.6  . Smokeless tobacco: Never Used  Substance and Sexual Activity  . Alcohol use: No    Alcohol/week: 0.0 standard drinks  . Drug use: No  . Sexual activity: Not Currently  Lifestyle  . Physical activity    Days per week: Not on file    Minutes per session: Not on file  . Stress: Not on file  Relationships  . Social Herbalist on phone: Not on file    Gets together: Not on file    Attends religious service: Not on file    Active member of club or organization: Not on file    Attends meetings of clubs or organizations: Not on file    Relationship status: Not on file  Other Topics Concern  . Not on file  Social History Narrative   Angel [name] is right-handed. He is married to same sex partner. He drinks 6-9 glasses of tea a day. He gets little exercise.   Additional Social History:                         Sleep: Good  Appetite:  Good  Current Medications:   Lab Results:  No results found for this or any previous visit (from the  past 48 hour(s)).  Physical Findings: AIMS:  , ,  ,  ,    CIWA:    COWS:     Musculoskeletal: Strength & Muscle Tone: within normal limits Gait & Station: normal Angel [name] leans: N/A  Psychiatric Specialty Exam: ROS  There were no vitals taken for this visit.There is no height or weight on file to calculate BMI.  General Appearance: Casual  Eye Contact:: Good   Speech:  Clear and Coherent  Volume:  Normal  Mood:  Euthymic  Affect:  NA and Appropriate  Thought Process:  Coherent  Orientation:  Full (Time, Place, and Person)  Thought Content:  WDL  Suicidal Thoughts:  No  Homicidal Thoughts:  No  Memory:  NA  Judgement:  Good  Insight:  Good  Psychomotor Activity:  Normal  Concentration:  Good  Recall:  Good  Fund of Knowledge:Good  Language: Good  Akathisia:  No  Handed:  Right  AIMS (if indicated):     Assets:  Desire for Improvement  ADL's:  Intact  Cognition: WNL  Sleep:      Treatment Plan Summary: 12/11/2018,    There were no vitals taken for this visit.There is no height or weight on file to calculate BMI.  General Appearance: Casual  Eye Contact:: Good   Speech:  Clear and Coherent  Volume:  Normal  Mood:  Euthymic  Affect:  NA and Appropriate  Thought Process:  Coherent  Orientation:  Full (Time, Place, and Person)  Thought Content:  WDL  Suicidal Thoughts:  No  Homicidal Thoughts:  No  Memory:  NA  Judgement:  Good  Insight:  Good  Psychomotor Activity:  Normal  Concentration:  Good  Recall:  Good  Fund of Knowledge:Good  Language: Good  Akathisia:  No  Handed:  Right  AIMS (if indicated):     Assets:  Desire for Improvement  ADL's:  Intact  Cognition: WNL  Sleep:      Treatment Plan Summary: 12/11/2018, Today the Angel [name] feels well.  His mood is even and stable.  His first problem is that of bipolar disorder and he takes Depakote and lithium successfully.  He also takes Effexor and Klonopin as prescribed.  Angel [name] is functioning extremely  well.  He is got good energy.  He is sleeping well taking trazodone 300 mg.  The Angel [name] is not suicidal.  He is very stable at this time.  This Angel [name] will be seen again in 3 months ideally by coming into the office.

## 2018-12-17 ENCOUNTER — Telehealth: Payer: BC Managed Care – PPO | Admitting: Family

## 2018-12-17 DIAGNOSIS — K591 Functional diarrhea: Secondary | ICD-10-CM | POA: Diagnosis not present

## 2018-12-17 NOTE — Progress Notes (Signed)
We are sorry that you are not feeling well.  Here is how we plan to help!  Based on what you have shared with me it looks like you have Acute Infectious Diarrhea.  Most cases of acute diarrhea are due to infections with virus and bacteria and are self-limited conditions lasting less than 14 days.  For your symptoms you may take Imodium 2 mg tablets that are over the counter at your local pharmacy. Take two tablet now and then one after each loose stool up to 6 a day.  Antibiotics are not needed for most people with diarrhea.    HOME CARE  We recommend changing your diet to help with your symptoms for the next few days.  Drink plenty of fluids that contain water salt and sugar. Sports drinks such as Gatorade may help.   You may try broths, soups, bananas, applesauce, soft breads, mashed potatoes or crackers.   You are considered infectious for as long as the diarrhea continues. Hand washing or use of alcohol based hand sanitizers is recommend.  It is best to stay out of work or school until your symptoms stop.   GET HELP RIGHT AWAY  If you have dark yellow colored urine or do not pass urine frequently you should drink more fluids.    If your symptoms worsen   If you feel like you are going to pass out (faint)  You have a new problem  MAKE SURE YOU   Understand these instructions.  Will watch your condition.  Will get help right away if you are not doing well or get worse.  Your e-visit answers were reviewed by a board certified advanced clinical practitioner to complete your personal care plan.  Depending on the condition, your plan could have included both over the counter or prescription medications.  If there is a problem please reply  once you have received a response from your provider.  Your safety is important to us.  If you have drug allergies check your prescription carefully.    You can use MyChart to ask questions about today's visit, request a non-urgent call  back, or ask for a work or school excuse for 24 hours related to this e-Visit. If it has been greater than 24 hours you will need to follow up with your provider, or enter a new e-Visit to address those concerns.   You will get an e-mail in the next two days asking about your experience.  I hope that your e-visit has been valuable and will speed your recovery. Thank you for using e-visits.   Greater than 5 minutes, yet less than 10 minutes of time have been spent researching, coordinating, and implementing care for this patient today.  Thank you for the details you included in the comment boxes. Those details are very helpful in determining the best course of treatment for you and help us to provide the best care.  

## 2018-12-21 ENCOUNTER — Other Ambulatory Visit: Payer: Self-pay | Admitting: Medical

## 2018-12-22 ENCOUNTER — Encounter: Payer: Self-pay | Admitting: Medical

## 2018-12-23 ENCOUNTER — Other Ambulatory Visit (HOSPITAL_COMMUNITY): Payer: Self-pay

## 2018-12-23 DIAGNOSIS — F331 Major depressive disorder, recurrent, moderate: Secondary | ICD-10-CM

## 2018-12-23 MED ORDER — VENLAFAXINE HCL ER 150 MG PO CP24: 150.0000 mg | ORAL_CAPSULE | Freq: Every morning | ORAL | 0 refills | Status: DC

## 2018-12-23 MED ORDER — LITHIUM CARBONATE 600 MG PO CAPS: ORAL_CAPSULE | ORAL | 5 refills | Status: DC

## 2018-12-23 MED ORDER — DIVALPROEX SODIUM ER 250 MG PO TB24: ORAL_TABLET | ORAL | 5 refills | Status: DC

## 2018-12-23 MED ORDER — TRAZODONE HCL 50 MG PO TABS: ORAL_TABLET | ORAL | 12 refills | Status: DC

## 2019-01-01 ENCOUNTER — Telehealth: Payer: Self-pay | Admitting: Medical

## 2019-01-01 ENCOUNTER — Encounter: Payer: Self-pay | Admitting: Medical

## 2019-01-01 NOTE — Telephone Encounter (Signed)
Are you ok with him getting started on Shingrix?

## 2019-01-01 NOTE — Telephone Encounter (Signed)
yes

## 2019-01-01 NOTE — Telephone Encounter (Signed)
Patient notified that he can get shingles.

## 2019-01-01 NOTE — Telephone Encounter (Signed)
Pt coming tomorrow 8/27 for flu shot and wanting shingles shot as well. Please advise.   Copied from [place] [phone]. Topic: Appointment Scheduling - Scheduling Inquiry for Clinic >> Dec 31, 2018 11:53 AM Rayann Heman wrote: Reason for CRM: pt called and would like to schedule shingles shot. Please advise

## 2019-01-02 ENCOUNTER — Encounter: Payer: Self-pay | Admitting: Medical

## 2019-01-02 ENCOUNTER — Other Ambulatory Visit: Payer: Self-pay

## 2019-01-02 ENCOUNTER — Ambulatory Visit (INDEPENDENT_AMBULATORY_CARE_PROVIDER_SITE_OTHER): Payer: BC Managed Care – PPO

## 2019-01-02 DIAGNOSIS — Z23 Encounter for immunization: Secondary | ICD-10-CM

## 2019-01-02 NOTE — Progress Notes (Addendum)
Patient here for flu and 1st shingles vaccine.  Vaccines given and patient tolerated well.  Agree with administration.  [name], PA-C

## 2019-01-19 ENCOUNTER — Encounter: Payer: Self-pay | Admitting: Medical

## 2019-01-20 DIAGNOSIS — I8510 Secondary esophageal varices without bleeding: Secondary | ICD-10-CM

## 2019-01-20 DIAGNOSIS — K7469 Other cirrhosis of liver: Secondary | ICD-10-CM

## 2019-01-20 DIAGNOSIS — K766 Portal hypertension: Secondary | ICD-10-CM

## 2019-01-20 DIAGNOSIS — B181 Chronic viral hepatitis B without delta-agent: Secondary | ICD-10-CM

## 2019-01-20 MED ORDER — NADOLOL 40 MG TABLET
ORAL_TABLET | 3 refills | 0 days | Status: CP
Start: 2019-01-20 — End: ?

## 2019-01-27 ENCOUNTER — Other Ambulatory Visit (INDEPENDENT_AMBULATORY_CARE_PROVIDER_SITE_OTHER): Payer: Self-pay | Admitting: Orthopaedic Surgery

## 2019-01-27 DIAGNOSIS — B181 Chronic viral hepatitis B without delta-agent: Secondary | ICD-10-CM

## 2019-01-27 DIAGNOSIS — K7460 Unspecified cirrhosis of liver: Secondary | ICD-10-CM

## 2019-01-27 DIAGNOSIS — Z79899 Other long term (current) drug therapy: Secondary | ICD-10-CM

## 2019-01-28 NOTE — Telephone Encounter (Signed)
sure

## 2019-01-28 NOTE — Telephone Encounter (Signed)
Ok to rf? 

## 2019-02-07 ENCOUNTER — Telehealth (HOSPITAL_COMMUNITY): Payer: Self-pay

## 2019-02-07 NOTE — Telephone Encounter (Signed)
Patient is calling to report that he is having manic symptoms and when he tries to sleep he is having nightmares. He has an appointment with you on 11/4, he would like a call to discuss symptoms and possible medication changes.

## 2019-02-23 ENCOUNTER — Encounter: Payer: Self-pay | Admitting: Medical

## 2019-02-25 ENCOUNTER — Other Ambulatory Visit (HOSPITAL_COMMUNITY): Payer: Self-pay | Admitting: Psychiatry

## 2019-02-25 ENCOUNTER — Encounter: Payer: Self-pay | Admitting: Medical

## 2019-02-25 DIAGNOSIS — F331 Major depressive disorder, recurrent, moderate: Secondary | ICD-10-CM

## 2019-02-26 ENCOUNTER — Encounter: Payer: Self-pay | Admitting: Medical

## 2019-02-26 ENCOUNTER — Telehealth: Payer: Self-pay | Admitting: Physical Medicine and Rehabilitation

## 2019-02-26 NOTE — Telephone Encounter (Signed)
Is auth needed? Scheduled for 11/11 with driver and no blood thinners.

## 2019-02-26 NOTE — Telephone Encounter (Signed)
ok 

## 2019-02-26 NOTE — Telephone Encounter (Signed)
Left message #1

## 2019-02-27 ENCOUNTER — Telehealth: Payer: Self-pay | Admitting: *Deleted

## 2019-02-27 ENCOUNTER — Encounter: Payer: Self-pay | Admitting: Medical

## 2019-02-27 ENCOUNTER — Telehealth: Payer: Self-pay | Admitting: Physical Medicine and Rehabilitation

## 2019-02-27 ENCOUNTER — Telehealth: Payer: Self-pay | Admitting: Medical

## 2019-02-27 ENCOUNTER — Other Ambulatory Visit: Payer: Self-pay

## 2019-02-27 ENCOUNTER — Telehealth: Payer: Self-pay

## 2019-02-27 DIAGNOSIS — B181 Chronic viral hepatitis B without delta-agent: Secondary | ICD-10-CM | POA: Diagnosis not present

## 2019-02-27 DIAGNOSIS — M542 Cervicalgia: Secondary | ICD-10-CM

## 2019-02-27 DIAGNOSIS — Z79899 Other long term (current) drug therapy: Secondary | ICD-10-CM | POA: Diagnosis not present

## 2019-02-27 DIAGNOSIS — K7460 Unspecified cirrhosis of liver: Secondary | ICD-10-CM | POA: Diagnosis not present

## 2019-02-27 NOTE — Telephone Encounter (Signed)
Spoke with pt. Pt changed appointment to on person tomorrow

## 2019-02-27 NOTE — Telephone Encounter (Signed)
Referral to orthopedist place.

## 2019-02-27 NOTE — Telephone Encounter (Signed)
Copied from [place] [phone]. Topic: General - Other >> Feb 27, 2019  9:48 AM Pauline Good wrote: Reason for CRM: pt called and stated Dr Rita Ohara can't do his neck injections due to they are moving and suggest he called his pcp. Pt want to know if he can get IV injections. Please call pt to advise

## 2019-02-27 NOTE — Telephone Encounter (Signed)
PA is in progress. 

## 2019-02-27 NOTE — Telephone Encounter (Signed)
Pt calling and asking questions about iv medication for pain through ED. I can't arrange that. Not sure what he is referring to. If he is in severe pain and needs immediate help then he needs to go to ED. They will make independent decision and give Iv pain meds if necessary. They won't give just because I say it is necessary  He has appontment with me tomorrow.

## 2019-02-28 ENCOUNTER — Encounter: Payer: Self-pay | Admitting: Medical

## 2019-02-28 ENCOUNTER — Ambulatory Visit (INDEPENDENT_AMBULATORY_CARE_PROVIDER_SITE_OTHER): Payer: BC Managed Care – PPO | Admitting: Medical

## 2019-02-28 VITALS — BP 120/60 | HR 82 | Temp 96.3°F | Resp 16 | Ht 70.0 in | Wt 234.6 lb

## 2019-02-28 DIAGNOSIS — R739 Hyperglycemia, unspecified: Secondary | ICD-10-CM | POA: Diagnosis not present

## 2019-02-28 LAB — HEMOGLOBIN A1C: Hgb A1c MFr Bld: 6.3 % (ref 4.6–6.5)

## 2019-02-28 MED ORDER — METHOCARBAMOL 750 MG PO TABS: 750.0000 mg | ORAL_TABLET | Freq: Four times a day (QID) | ORAL | 1 refills | Status: DC

## 2019-02-28 MED ORDER — KETOROLAC TROMETHAMINE 60 MG/2ML IM SOLN
60.0000 mg | Freq: Once | INTRAMUSCULAR | Status: AC
  Administered 2019-02-28: 60 mg via INTRAMUSCULAR

## 2019-02-28 MED ORDER — SUMATRIPTAN SUCCINATE 50 MG PO TABS: ORAL_TABLET | ORAL | 0 refills | Status: DC

## 2019-02-28 MED ORDER — PREDNISONE 10 MG PO TABS: ORAL_TABLET | ORAL | 0 refills | Status: DC

## 2019-02-28 NOTE — Patient Instructions (Signed)
For recent severe ha with combination of tension ha vs migraine features, we gave you toradol 60 mg im. Can continue methocarbinol. Confirmed max dose with pharmacist today.  Can start tapered low dose prednisone tomorrow.   Can try imitrex early onset when you get severe migraine type ha. Rx advisement given to both Pt and [name](who was on phone).  Will get a1c today since mild hx of elevated sugar and will be on prednisone.  Follow up with specialist for neck injection Mar 19, 2019.  Follow up with me sooner if needed. Request pt update me by Tuesday how he is dong.  If any severe ha with neuro type signs or symptoms then recommend ED evaluation.  Counseled pt on max dose methocarbinol.(max per pharmacist is 4000 mg a day)

## 2019-02-28 NOTE — Progress Notes (Signed)
Subjective:    Patient ID: Angel [name], male    DOB: [birth year], 63 y.o.   MRN: [id number]  HPI   Pt in for follow up.  Pt has hx of neck pain and some ha associated with neck pain. He states migraine ha in past with this. Pt states in past relied on high doses of tylenol but GI told not to take tylenol any more. [facility] did liver enzymes yesterday. I don't see those in epic but pt has those and will try to attach this to message in my chart.  Pt states he is getting some ha every day. He will feel ha coming on and states when get ha will last 2-3 hours minimum. Neck feels tight when gets ha. He will get light sensitive with HA. He will go to dark room to stop ha.  Pt takes he takes high dose of methocarbimol. Sometimes will take 750-500 at times together.   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 And got injections in the past.   Pt has seen neurologist in the past as well. Pt has imaging studies in past.    Review of Systems  Constitutional: Negative for chills, diaphoresis and fatigue.  Respiratory: Negative for cough, chest tightness, shortness of breath and wheezing.   Cardiovascular: Negative for chest pain and palpitations.  Gastrointestinal: Negative for abdominal pain, blood in stool and constipation.  Musculoskeletal: Negative for back pain.  Skin: Negative for rash.  Neurological: Negative for dizziness, syncope, weakness and headaches.  Hematological: Negative for adenopathy. Does not bruise/bleed easily.  Psychiatric/Behavioral: Negative for behavioral problems, confusion, hallucinations and suicidal ideas. The patient is not nervous/anxious.     Past Medical History:  Diagnosis Date  . Bipolar disorder ([place])   . 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 Depression   . GERD (gastroesophageal reflux disease)   . Headache   . Hepatitis B    Hepatitis E. antigen positive by history, status post treatment with Hepsera and baraclude   . Hepatitis B carrier ([place])    . Personal history of colonic polyps    Adenomas polyp 2008 and 2010, 2015   . Weight loss    Pt. has lost 10 lbs since pre-visit, intentionally with diet and walking     Social History   Socioeconomic History  . Marital status: Married    Spouse name: [name]  . Number of children: 0  . Years of education: Not on file  . Highest education level: Master's degree (e.g., MA, MS, MEng, MEd, MSW, MBA)  Occupational History  . 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 Financial resource strain: Not on file  . Food insecurity    Worry: Not on file    Inability: Not on file  . Transportation needs    Medical: Not on file    Non-medical: Not on file  Tobacco Use  . Smoking status: Former Smoker    Types: Cigarettes    Quit date: 05/08/2009    Years since quitting: 9.8  . Smokeless tobacco: Never Used  Substance and Sexual Activity  . Alcohol use: No    Alcohol/week: 0.0 standard drinks  . Drug use: No  . Sexual activity: Not Currently  Lifestyle  . Physical activity    Days per week: Not on file    Minutes per session: Not on file  . Stress: Not on file  Relationships  . Social connections  Talks on phone: Not on file    Gets together: Not on file    Attends religious service: Not on file    Active member of club or organization: Not on file    Attends meetings of clubs or organizations: Not on file    Relationship status: Not on file  . Intimate partner violence    Fear of current or ex partner: Not on file    Emotionally abused: Not on file    Physically abused: Not on file    Forced sexual activity: Not on file  Other Topics Concern  . Not on file  Social History Narrative   Patient is right-handed. He is married to same sex partner. He drinks 6-9 glasses of tea a day. He gets little exercise.    Past Surgical History:  Procedure Laterality Date  . APPENDECTOMY    . COLONOSCOPY    . POLYPECTOMY    . UPPER GASTROINTESTINAL  ENDOSCOPY      Family History  Problem Relation Age of Onset  . Heart disease Mother   . Diabetes Father   . Kidney failure Father   . Kidney disease Father   . Other Sister        GERD  . Colon cancer Neg Hx   . Rectal cancer Neg Hx   . Stomach cancer Neg Hx   . Esophageal cancer Neg Hx     Allergies  Allergen Reactions  . Aripiprazole Other (See Comments)    Tardive Dyskinesia  . Invega [Paliperidone Er] Other (See Comments)    Tardive dyskinsia symptoms  . Abilify [Aripiprazole]   . Lamictal [Lamotrigine] Rash  . Lexapro [Escitalopram Oxalate] Other (See Comments)    Doesn't Work    Current Outpatient Medications on File Prior to Visit  Medication Sig Dispense Refill  . aspirin EC 81 MG tablet Take 81 mg by mouth daily.    . bisacodyl (BISACODYL) 5 MG EC tablet TAKE 1 TABLET BY MOUTH EVERY DAY AS NEEDED FOR MODERATE CONSTIPATION 90 tablet 3  . clonazePAM (KLONOPIN) 1 MG tablet 2  qhs 60 tablet 5  . divalproex (DEPAKOTE ER) 250 MG 24 hr tablet 1  qhs  For 2 days  Then  1 bid   For   3  Days  Then 1 qam  And  2  qhs 90 tablet 5  . Inulin-Cholecalciferol (FIBER/D3 ADULT GUMMIES) 2.5-500 GM-UNIT CHEW 1 tab po q day 90 tablet 3  . lithium 600 MG capsule 1 qam   2  qhs 90 capsule 5  . Melatonin 10 MG SUBL Place 10 mg under the tongue at bedtime.    . methocarbamol (ROBAXIN) 500 MG tablet TAKE 1 TABLET(500 MG) BY MOUTH EVERY 6 HOURS AS NEEDED FOR MUSCLE SPASMS 30 tablet 2  . methocarbamol (ROBAXIN-750) 750 MG tablet Take 1 tablet (750 mg total) by mouth every 8 (eight) hours as needed for muscle spasms. 30 tablet 3  . nadolol (CORGARD) 40 MG tablet TAKE 1 TABLET BY MOUTH EVERY DAY 30 tablet 0  . traZODone (DESYREL) 50 MG tablet TAKE 3 TABLETS DAILY AT BEDTIME AND MAY REPEAT WITH ONE MORE TABLET 100 tablet 12  . venlafaxine XR (EFFEXOR-XR) 150 MG 24 hr capsule Take 1 capsule (150 mg total) by mouth every morning. 90 capsule 0   No current facility-administered medications on  file prior to visit.     BP 120/60   Pulse 82   Temp (!) 96.3 F (35.7 C) (Oral)  Resp 16   Ht 5\' 10"  (1.778 m)   Wt 234 lb 9.6 oz (106.4 kg)   SpO2 97%   BMI 33.66 kg/m       Objective:   Physical Exam  General Mental Status- Alert. General Appearance- Not in acute distress.   Skin General: Color- Normal Color. Moisture- Normal Moisture.  Neck Carotid Arteries- Normal color. Moisture- Normal Moisture. No carotid bruits. No JVD. Trapezius tenderness to palpation.  Chest and Lung Exam Auscultation: Breath Sounds:-Normal.  Cardiovascular Auscultation:Rythm- Regular. Murmurs & Other Heart Sounds:Auscultation of the heart reveals- No Murmurs.  Abdomen Inspection:-Inspeection Normal. Palpation/Percussion:Note:No mass. Palpation and Percussion of the abdomen reveal- Non Tender, Non Distended + BS, no rebound or guarding.    Neurologic Cranial Nerve exam:- CN III-XII intact(No nystagmus), symmetric smile. Drift Test:- No drift. Finger to Nose:- Normal/Intact Strength:- 5/5 equal and symmetric strength both upper and lower extremities.      Assessment & Plan:  For recent severe ha with combination of tension ha vs migraine features, we gave you toradol 60 mg im. Can continue methocarbinol. Confirmed max dose with pharmacist today.  Can start tapered low dose prednisone tomorrow.   Can try imitrex early onset when you get severe migraine type ha. Rx advisement given to both Pt and [name](who was on phone).  Will get a1c today since mild hx of elevated sugar and will be on prednisone.  Follow up with specialist for neck injection Mar 19, 2019.  Follow up with me sooner if needed. Request pt update me by Tuesday how he is dong.  If any severe ha with neuro type signs or symptoms then recommend ED evaluation.  40 minutes spent with pt. 50% of time spent counseling pt on plan going forward.  [name], PA-C

## 2019-03-01 ENCOUNTER — Telehealth: Payer: Self-pay | Admitting: Medical

## 2019-03-01 MED ORDER — METFORMIN HCL 500 MG PO TABS: 500.0000 mg | ORAL_TABLET | Freq: Every day | ORAL | 3 refills | Status: DC

## 2019-03-01 NOTE — Telephone Encounter (Signed)
Metformin sent to pt pharmacy.

## 2019-03-02 ENCOUNTER — Encounter: Payer: Self-pay | Admitting: Medical

## 2019-03-03 ENCOUNTER — Encounter: Payer: Self-pay | Admitting: Medical

## 2019-03-04 ENCOUNTER — Other Ambulatory Visit: Payer: Self-pay

## 2019-03-04 ENCOUNTER — Ambulatory Visit (INDEPENDENT_AMBULATORY_CARE_PROVIDER_SITE_OTHER): Payer: BC Managed Care – PPO | Admitting: *Deleted

## 2019-03-04 VITALS — BP 123/74 | HR 67

## 2019-03-04 DIAGNOSIS — Z23 Encounter for immunization: Secondary | ICD-10-CM

## 2019-03-04 DIAGNOSIS — R519 Headache, unspecified: Secondary | ICD-10-CM

## 2019-03-04 MED ORDER — KETOROLAC TROMETHAMINE 60 MG/2ML IM SOLN
60.0000 mg | Freq: Once | INTRAMUSCULAR | Status: AC
  Administered 2019-03-04: 60 mg via INTRAMUSCULAR

## 2019-03-04 NOTE — Progress Notes (Addendum)
Patient here for second shingles vaccine.  Vaccine given in left deltoid and tolerated well.  Patient also came in with a complaint of headaches again.  He had a visit with Percell Miller on 02/28/19.  Headache went away and now came back.  He has been taking Imitrex with some relief.  Percell Miller spoke with patient and he advised to check blood pressure.  If blood pressure good then give 60mg  of toradol.  Blood pressure was 123/74.  Toradol given in left upper outer quadrant and he tolerated well.    Advised patient that neurologist should be calling to schedule and appointment.  This is advise I gave but will see how he responds to upcoming epidural injection before making decision on neurologist referral.  [name], PA-C

## 2019-03-05 ENCOUNTER — Encounter (HOSPITAL_BASED_OUTPATIENT_CLINIC_OR_DEPARTMENT_OTHER): Payer: Self-pay

## 2019-03-05 ENCOUNTER — Emergency Department (HOSPITAL_BASED_OUTPATIENT_CLINIC_OR_DEPARTMENT_OTHER)
Admission: EM | Admit: 2019-03-05 | Discharge: 2019-03-05 | Disposition: A | Discharge status: Home / Self Care | Payer: BC Managed Care – PPO | Attending: Emergency Medicine | Admitting: Emergency Medicine

## 2019-03-05 ENCOUNTER — Other Ambulatory Visit: Payer: Self-pay

## 2019-03-05 DIAGNOSIS — Z7982 Long term (current) use of aspirin: Secondary | ICD-10-CM | POA: Insufficient documentation

## 2019-03-05 DIAGNOSIS — Z79899 Other long term (current) drug therapy: Secondary | ICD-10-CM | POA: Diagnosis not present

## 2019-03-05 DIAGNOSIS — R519 Headache, unspecified: Secondary | ICD-10-CM | POA: Diagnosis not present

## 2019-03-05 DIAGNOSIS — M542 Cervicalgia: Secondary | ICD-10-CM | POA: Insufficient documentation

## 2019-03-05 DIAGNOSIS — Z888 Allergy status to other drugs, medicaments and biological substances status: Secondary | ICD-10-CM | POA: Insufficient documentation

## 2019-03-05 DIAGNOSIS — Z87891 Personal history of nicotine dependence: Secondary | ICD-10-CM | POA: Diagnosis not present

## 2019-03-05 DIAGNOSIS — Z7984 Long term (current) use of oral hypoglycemic drugs: Secondary | ICD-10-CM | POA: Diagnosis not present

## 2019-03-05 DIAGNOSIS — G43909 Migraine, unspecified, not intractable, without status migrainosus: Secondary | ICD-10-CM | POA: Insufficient documentation

## 2019-03-05 MED ORDER — SODIUM CHLORIDE 0.9 % IV BOLUS
1000.0000 mL | Freq: Once | INTRAVENOUS | Status: AC
  Administered 2019-03-05: 1000 mL via INTRAVENOUS

## 2019-03-05 MED ORDER — KETOROLAC TROMETHAMINE 30 MG/ML IJ SOLN
30.0000 mg | Freq: Once | INTRAMUSCULAR | Status: AC
  Administered 2019-03-05: 30 mg via INTRAVENOUS
  Filled 2019-03-05: qty 1

## 2019-03-05 MED ORDER — DEXAMETHASONE SODIUM PHOSPHATE 10 MG/ML IJ SOLN
10.0000 mg | Freq: Once | INTRAMUSCULAR | Status: AC
  Administered 2019-03-05: 10 mg via INTRAVENOUS
  Filled 2019-03-05: qty 1

## 2019-03-05 MED ORDER — DIPHENHYDRAMINE HCL 50 MG/ML IJ SOLN
25.0000 mg | Freq: Once | INTRAMUSCULAR | Status: AC
  Administered 2019-03-05: 25 mg via INTRAVENOUS
  Filled 2019-03-05: qty 1

## 2019-03-05 MED ORDER — HYDROCODONE-ACETAMINOPHEN 5-325 MG PO TABS: 1.0000 | ORAL_TABLET | Freq: Four times a day (QID) | ORAL | 0 refills | Status: DC | PRN

## 2019-03-05 MED ORDER — NAPROXEN 500 MG PO TABS: 500.0000 mg | ORAL_TABLET | Freq: Two times a day (BID) | ORAL | 0 refills | Status: DC

## 2019-03-05 MED ORDER — METOCLOPRAMIDE HCL 5 MG/ML IJ SOLN
10.0000 mg | Freq: Once | INTRAMUSCULAR | Status: AC
  Administered 2019-03-05: 10 mg via INTRAVENOUS
  Filled 2019-03-05: qty 2

## 2019-03-05 NOTE — ED Triage Notes (Signed)
Pt reports neck pain, stating that he is supposed to get an injection Nov. 11th - states that he needs the IV drip that he had a year ago here.

## 2019-03-05 NOTE — Discharge Instructions (Addendum)
Begin taking naproxen as prescribed.  Begin taking hydrocodone as prescribed as needed for pain not relieved with naproxen.  Follow-up with your neurosurgeon as scheduled, sooner if symptoms are not improving in the next few days.  Return to the emergency department for worsening headache, high fever, or other new and concerning symptoms.

## 2019-03-05 NOTE — ED Provider Notes (Signed)
[place] EMERGENCY DEPARTMENT Provider Note   CSN: [id number] Arrival date & time: 03/05/19  1510     History   Chief Complaint Chief Complaint  Patient presents with  . Neck Pain    HPI Angel [name] is a 63 y.o. male.     Patient is a 63 year old male with history of bipolar disorder, hepatitis B, depression, and cervical disc disease.  He presents today with complaints of neck pain and headache.  This is been ongoing for several weeks.  He has seen a neurosurgeon in the past and has had steroid injections.  He has an appointment to see the neurologist in 2 weeks, however he is having worsening pain.  He denies any numbness or tingling.  He denies any weakness or bowel or bladder complaints.  He denies any new injury or trauma.  The history is provided by the patient.  Neck Pain Pain location:  Generalized neck Quality:  Stabbing Pain radiates to:  Does not radiate Pain severity:  Severe Pain is:  Same all the time Timing:  Constant Progression:  Worsening Chronicity:  Recurrent Relieved by:  Nothing Worsened by:  Position and twisting   Past Medical History:  Diagnosis Date  . Bipolar disorder ([place])   . 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 Depression   . GERD (gastroesophageal reflux disease)   . Headache   . Hepatitis B    Hepatitis E. antigen positive by history, status post treatment with Hepsera and baraclude   . Hepatitis B carrier ([place])   . Personal history of colonic polyps    Adenomas polyp 2008 and 2010, 2015   . Weight loss    Pt. has lost 10 lbs since pre-visit, intentionally with diet and walking    Patient Active Problem List   Diagnosis Date Noted  . Hyperglycemia 10/07/2013  . Hepatic cirrhosis ([place]) 10/03/2013  . Bipolar I disorder, most recent episode (or current) depressed, unspecified 05/21/2013  . Hepatitis B 03/21/2013  . Esophageal reflux 03/21/2013  . Personal history of colonic polyps 03/21/2013  .  Bipolar disorder, unspecified ([place]) 09/03/2011    Past Surgical History:  Procedure Laterality Date  . APPENDECTOMY    . COLONOSCOPY    . POLYPECTOMY    . UPPER GASTROINTESTINAL ENDOSCOPY          Home Medications    Prior to Admission medications   Medication Sig Start Date End Date Taking? Authorizing Provider  aspirin EC 81 MG tablet Take 81 mg by mouth daily.    [provider]  bisacodyl (BISACODYL) 5 MG EC tablet TAKE 1 TABLET BY MOUTH EVERY DAY AS NEEDED FOR MODERATE CONSTIPATION 12/24/18   Saguier, Percell Miller, PA-C  clonazePAM Bobbye [place]) 1 MG tablet 2  qhs 12/11/18   Plovsky, Berneta Sages, MD  divalproex (DEPAKOTE ER) 250 MG 24 hr tablet 1  qhs  For 2 days  Then  1 bid   For   3  Days  Then 1 qam  And  2  qhs 12/23/18   Plovsky, Berneta Sages, MD  Inulin-Cholecalciferol (FIBER/D3 ADULT GUMMIES) 2.5-500 GM-UNIT CHEW 1 tab po q day 07/16/18   Saguier, Percell Miller, PA-C  lithium 600 MG capsule 1 qam   2  qhs 12/23/18   Plovsky, Berneta Sages, MD  Melatonin 10 MG SUBL Place 10 mg under the tongue at bedtime.    [provider]  metFORMIN (GLUCOPHAGE) 500 MG tablet Take 1 tablet (500 mg total) by mouth daily with breakfast.  03/01/19   Saguier, Percell Miller, PA-C  methocarbamol (ROBAXIN) 750 MG tablet Take 1 tablet (750 mg total) by mouth 4 (four) times daily. 02/28/19   Saguier, Percell Miller, PA-C  nadolol (CORGARD) 40 MG tablet TAKE 1 TABLET BY MOUTH EVERY DAY 06/02/14   Inda Castle, MD  predniSONE (DELTASONE) 10 MG tablet 4 tab po day 1, 3 tab po day 2, 2 tab po day 3, 1 tab po day 4 02/28/19   Saguier, Percell Miller, PA-C  SUMAtriptan (IMITREX) 50 MG tablet 1 tab po at onset severe migraine type ha. Can repeat in 2 hours. Max dose 2 tab within any 24 hour period. 02/28/19   Saguier, Percell Miller, PA-C  traZODone (DESYREL) 50 MG tablet TAKE 3 TABLETS DAILY AT BEDTIME AND MAY REPEAT WITH ONE MORE TABLET 12/23/18   Norma Fredrickson, MD  venlafaxine XR (EFFEXOR-XR) 150 MG 24 hr capsule Take 1 capsule (150 mg total) by  mouth every morning. 12/23/18   Norma Fredrickson, MD    Family History Family History  Problem Relation Age of Onset  . Heart disease Mother   . Diabetes Father   . Kidney failure Father   . Kidney disease Father   . Other Sister        GERD  . Colon cancer Neg Hx   . Rectal cancer Neg Hx   . Stomach cancer Neg Hx   . Esophageal cancer Neg Hx     Social History Social History   Tobacco Use  . Smoking status: Former Smoker    Types: Cigarettes    Quit date: 05/08/2009    Years since quitting: 9.8  . Smokeless tobacco: Never Used  Substance Use Topics  . Alcohol use: No    Alcohol/week: 0.0 standard drinks  . Drug use: No     Allergies   Aripiprazole, Invega [paliperidone er], Abilify [aripiprazole], Lamictal [lamotrigine], and Lexapro [escitalopram oxalate]   Review of Systems Review of Systems  Musculoskeletal: Positive for neck pain.  All other systems reviewed and are negative.    Physical Exam Updated Vital Signs BP (!) 149/80 (BP Location: Right Arm)   Pulse 79   Temp 99.3 F (37.4 C) (Oral)   Resp 18   Ht 5\' 10"  (1.778 m)   Wt 106.6 kg   SpO2 100%   BMI 33.72 kg/m   Physical Exam Vitals signs and nursing note reviewed.  Constitutional:      General: He is not in acute distress.    Appearance: He is well-developed. He is not diaphoretic.  HENT:     Head: Normocephalic and atraumatic.  Neck:     Comments: There is no significant tenderness to the soft tissues of the neck.  There is no bony tenderness or step-off.  He does have pain with turning head and with neck flexion and extension. Cardiovascular:     Rate and Rhythm: Normal rate and regular rhythm.     Heart sounds: No murmur. No friction rub.  Pulmonary:     Effort: Pulmonary effort is normal. No respiratory distress.     Breath sounds: Normal breath sounds. No wheezing or rales.  Abdominal:     General: Bowel sounds are normal. There is no distension.     Palpations: Abdomen is soft.      Tenderness: There is no abdominal tenderness.  Musculoskeletal: Normal range of motion.  Skin:    General: Skin is warm and dry.  Neurological:     Mental Status: He is alert and oriented  to person, place, and time.     Sensory: No sensory deficit.     Motor: No weakness.     Coordination: Coordination normal.     Comments: Strength is 5 out of 5 in all extremities.  Pulses are easily palpable and sensation is intact throughout.      ED Treatments / Results  Labs (all labs ordered are listed, but only abnormal results are displayed) Labs Reviewed - No data to display  EKG None  Radiology No results found.  Procedures Procedures (including critical care time)  Medications Ordered in ED Medications  sodium chloride 0.9 % bolus 1,000 mL (has no administration in time range)  ketorolac (TORADOL) 30 MG/ML injection 30 mg (has no administration in time range)  dexamethasone (DECADRON) injection 10 mg (has no administration in time range)  diphenhydrAMINE (BENADRYL) injection 25 mg (has no administration in time range)  metoCLOPramide (REGLAN) injection 10 mg (has no administration in time range)     Initial Impression / Assessment and Plan / ED Course  I have reviewed the triage vital signs and the nursing notes.  Pertinent labs & imaging results that were available during my care of the patient were reviewed by me and considered in my medical decision making (see chart for details).  Patient presents here with complaints of headache and neck pain.  He has a history of cervical disc disease and is followed by neurosurgery.  He is due to see them in 2 weeks, however the pain is worsening and is now causing him to have a headache.  Patient has had a migraine cocktail in the past with good results.  He is requesting this and it was given.  He is now feeling markedly improved.  Patient is neurologically intact and I do not feel as though any imaging is indicated.  Patient will be  given anti-inflammatory medications, medicine for pain, and is to follow-up with his neurosurgeon in 2 weeks as scheduled.  Final Clinical Impressions(s) / ED Diagnoses   Final diagnoses:  None    ED Discharge Orders    None       Veryl Speak, MD 03/05/19 1735

## 2019-03-05 NOTE — Telephone Encounter (Signed)
Spoke with Shaune Spittle and she states for [phone] no pa is needed. Reference#[id number]

## 2019-03-05 NOTE — ED Notes (Signed)
ED Provider at bedside. 

## 2019-03-05 NOTE — ED Notes (Signed)
Patient verbalizes understanding of discharge instructions. Opportunity for questioning and answers were provided. Armband removed by staff, pt discharged from ED in wheelchair.  

## 2019-03-08 ENCOUNTER — Telehealth: Payer: Self-pay | Admitting: Medical

## 2019-03-08 ENCOUNTER — Encounter: Payer: Self-pay | Admitting: Medical

## 2019-03-08 NOTE — Telephone Encounter (Signed)
Will you call pt and advise that I want him to hold off on starting metformin. When he sees his liver specialist want him to get clearance ok to use.

## 2019-03-10 NOTE — Telephone Encounter (Signed)
Pt replied to mychart message.  

## 2019-03-11 ENCOUNTER — Ambulatory Visit (INDEPENDENT_AMBULATORY_CARE_PROVIDER_SITE_OTHER): Payer: BC Managed Care – PPO | Admitting: Medical

## 2019-03-11 ENCOUNTER — Other Ambulatory Visit: Payer: Self-pay

## 2019-03-11 ENCOUNTER — Encounter: Payer: Self-pay | Admitting: Medical

## 2019-03-11 VITALS — BP 140/74 | HR 60 | Temp 97.7°F | Resp 16 | Ht 70.0 in | Wt 234.2 lb

## 2019-03-11 DIAGNOSIS — R519 Headache, unspecified: Secondary | ICD-10-CM | POA: Diagnosis not present

## 2019-03-11 DIAGNOSIS — M542 Cervicalgia: Secondary | ICD-10-CM | POA: Diagnosis not present

## 2019-03-11 MED ORDER — KETOROLAC TROMETHAMINE 60 MG/2ML IM SOLN
60.0000 mg | Freq: Once | INTRAMUSCULAR | Status: AC
  Administered 2019-03-11: 15:00:00 60 mg via INTRAMUSCULAR

## 2019-03-11 NOTE — Progress Notes (Signed)
Subjective:    Patient ID: Angel [name], male    DOB: 24-Aug-1955, 63 y.o.   MRN: [id number]  HPI  Pt in for follow up.  Pt states he had   epidural procedure neck region on  March 19, 2019. This procedure is not cancelled.Pt states neck pain is still present but less than before after using norco.Pain now 4-5/10. Was 8-9/10.  Pt used norco tablet last night on empty stomach. It made him slight nausea. He has more tabs left over if needed.  Pt in recent past has responded well to toradol injections.    Review of Systems  Constitutional: Negative for chills, fatigue and fever.  Respiratory: Negative for cough, chest tightness, shortness of breath and wheezing.   Cardiovascular: Negative for chest pain and palpitations.  Gastrointestinal: Negative for abdominal pain.  Musculoskeletal: Negative for back pain.  Skin: Negative for rash.  Neurological: Positive for headaches. Negative for dizziness, seizures and weakness.       Pt ha level now about 4-5/10. Was 8-9/10 before he took hydrocodone.  Hematological: Negative for adenopathy. Does not bruise/bleed easily.  Psychiatric/Behavioral: Negative for behavioral problems.    Past Medical History:  Diagnosis Date  . Bipolar disorder ([place])   . 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 Depression   . GERD (gastroesophageal reflux disease)   . Headache   . Hepatitis B    Hepatitis E. antigen positive by history, status post treatment with Hepsera and baraclude   . Hepatitis B carrier ([place])   . Personal history of colonic polyps    Adenomas polyp 2008 and 2010, 2015   . Weight loss    Pt. has lost 10 lbs since pre-visit, intentionally with diet and walking     Social History   Socioeconomic History  . Marital status: Married    Spouse name: [name]  . Number of children: 0  . Years of education: Not on file  . Highest education level: Master's degree (e.g., MA, MS, MEng, MEd, MSW, MBA)  Occupational  History  . 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 Financial resource strain: Not on file  . Food insecurity    Worry: Not on file    Inability: Not on file  . Transportation needs    Medical: Not on file    Non-medical: Not on file  Tobacco Use  . Smoking status: Former Smoker    Types: Cigarettes    Quit date: 05/08/2009    Years since quitting: 9.8  . Smokeless tobacco: Never Used  Substance and Sexual Activity  . Alcohol use: No    Alcohol/week: 0.0 standard drinks  . Drug use: No  . Sexual activity: Not Currently  Lifestyle  . Physical activity    Days per week: Not on file    Minutes per session: Not on file  . Stress: Not on file  Relationships  . Social Herbalist on phone: Not on file    Gets together: Not on file    Attends religious service: Not on file    Active member of club or organization: Not on file    Attends meetings of clubs or organizations: Not on file    Relationship status: Not on file  . Intimate partner violence    Fear of current or ex partner: Not on file    Emotionally abused: Not on file    Physically abused: Not on file  Forced sexual activity: Not on file  Other Topics Concern  . Not on file  Social History Narrative   Patient is right-handed. He is married to same sex partner. He drinks 6-9 glasses of tea a day. He gets little exercise.    Past Surgical History:  Procedure Laterality Date  . APPENDECTOMY    . COLONOSCOPY    . POLYPECTOMY    . UPPER GASTROINTESTINAL ENDOSCOPY      Family History  Problem Relation Age of Onset  . Heart disease Mother   . Diabetes Father   . Kidney failure Father   . Kidney disease Father   . Other Sister        GERD  . Colon cancer Neg Hx   . Rectal cancer Neg Hx   . Stomach cancer Neg Hx   . Esophageal cancer Neg Hx     Allergies  Allergen Reactions  . Aripiprazole Other (See Comments)    Tardive Dyskinesia  . Invega [Paliperidone Er] Other (See  Comments)    Tardive dyskinsia symptoms  . Abilify [Aripiprazole]   . Lamictal [Lamotrigine] Rash  . Lexapro [Escitalopram Oxalate] Other (See Comments)    Doesn't Work    Current Outpatient Medications on File Prior to Visit  Medication Sig Dispense Refill  . aspirin EC 81 MG tablet Take 81 mg by mouth daily.    . bisacodyl (BISACODYL) 5 MG EC tablet TAKE 1 TABLET BY MOUTH EVERY DAY AS NEEDED FOR MODERATE CONSTIPATION 90 tablet 3  . clonazePAM (KLONOPIN) 1 MG tablet 2  qhs 60 tablet 5  . divalproex (DEPAKOTE ER) 250 MG 24 hr tablet 1  qhs  For 2 days  Then  1 bid   For   3  Days  Then 1 qam  And  2  qhs 90 tablet 5  . HYDROcodone-acetaminophen (NORCO) 5-325 MG tablet Take 1-2 tablets by mouth every 6 (six) hours as needed. 20 tablet 0  . Inulin-Cholecalciferol (FIBER/D3 ADULT GUMMIES) 2.5-500 GM-UNIT CHEW 1 tab po q day 90 tablet 3  . lithium 600 MG capsule 1 qam   2  qhs 90 capsule 5  . Melatonin 10 MG SUBL Place 10 mg under the tongue at bedtime.    . metFORMIN (GLUCOPHAGE) 500 MG tablet Take 1 tablet (500 mg total) by mouth daily with breakfast. 90 tablet 3  . methocarbamol (ROBAXIN) 750 MG tablet Take 1 tablet (750 mg total) by mouth 4 (four) times daily. 120 tablet 1  . nadolol (CORGARD) 40 MG tablet TAKE 1 TABLET BY MOUTH EVERY DAY 30 tablet 0  . naproxen (NAPROSYN) 500 MG tablet Take 1 tablet (500 mg total) by mouth 2 (two) times daily. 20 tablet 0  . predniSONE (DELTASONE) 10 MG tablet 4 tab po day 1, 3 tab po day 2, 2 tab po day 3, 1 tab po day 4 10 tablet 0  . SUMAtriptan (IMITREX) 50 MG tablet 1 tab po at onset severe migraine type ha. Can repeat in 2 hours. Max dose 2 tab within any 24 hour period. 10 tablet 0  . traZODone (DESYREL) 50 MG tablet TAKE 3 TABLETS DAILY AT BEDTIME AND MAY REPEAT WITH ONE MORE TABLET 100 tablet 12  . venlafaxine XR (EFFEXOR-XR) 150 MG 24 hr capsule Take 1 capsule (150 mg total) by mouth every morning. 90 capsule 0   No current facility-administered  medications on file prior to visit.     BP 140/74   Pulse 60  Temp 97.7 F (36.5 C) (Temporal)   Resp 16   Ht 5\' 10"  (1.778 m)   Wt 234 lb 3.2 oz (106.2 kg)   SpO2 98%   BMI 33.60 kg/m       Objective:   Physical Exam  General Mental Status- Alert. General Appearance- Not in acute distress.   Skin General: Color- Normal Color. Moisture- Normal Moisture.  Neck Carotid Arteries- Normal color. Moisture- Normal Moisture. No carotid bruits. No JVD. Rt side lower para cervical spine tenderness.  Chest and Lung Exam Auscultation: Breath Sounds:-Normal.  Cardiovascular Auscultation:Rythm- Regular. Murmurs & Other Heart Sounds:Auscultation of the heart reveals- No Murmurs.   Neurologic Cranial Nerve exam:- CN III-XII intact(No nystagmus), symmetric smile. Drift Test:- No drift. Finger to Nose:- Normal/Intact Strength:- 5/5 equal and symmetric strength both upper and lower extremities.      Assessment & Plan:  You do have moderate persisting ha(less than this am). Normal neurologic exam today.   We gave you toradol 60 mg im in office today. Starting tomorrow around 2 pm can take naprosyn which ED prescribed. And use norco 1 tab very 12 hours as back up for ha.   Continue with muscle relaxant. Can stop imitrex since you think has not helped.  Hopefully we can keep ha controlled until you get epidural. I have hopes if neck pain improved that your ha will resolve. As you may have large component of tension type ha from neck pain.  Follow up 7 days or sooner if need(prior to epidural injections)   25 minutes spent with pt. 50% of time spent counseling pt on plan going forward.  [name], PA-C

## 2019-03-11 NOTE — Patient Instructions (Addendum)
You do have moderate persisting ha(less than this am). Normal neurologic exam today.   We gave you toradol 60 mg im in office today. Starting tomorrow around 2 pm can take naprosyn which ED prescribed. And use norco 1 tab very 12 hours as back up for ha.   Continue with muscle relaxant. Can stop imitrex since you think has not helped.  Hopefully we can keep ha controlled until you get epidural. I have hopes if neck pain improved that your ha will resolve. As you may have large component of tension type ha from neck pain.  Follow up 7 days or sooner if need(prior to epidural injections)

## 2019-03-12 ENCOUNTER — Encounter: Payer: Self-pay | Admitting: Medical

## 2019-03-12 ENCOUNTER — Ambulatory Visit (HOSPITAL_COMMUNITY): Payer: BC Managed Care – PPO | Admitting: Psychiatry

## 2019-03-12 NOTE — Telephone Encounter (Signed)
Refill Request: Norco  Last RX:03/05/19 Last OV:03/11/19 Next OV: none scheduled  UDS: CSC: CSR:

## 2019-03-13 ENCOUNTER — Telehealth: Payer: Self-pay | Admitting: Medical

## 2019-03-13 MED ORDER — NAPROXEN 500 MG PO TABS: 500.0000 mg | ORAL_TABLET | Freq: Two times a day (BID) | ORAL | 0 refills | Status: DC

## 2019-03-13 MED ORDER — HYDROCODONE-ACETAMINOPHEN 5-325 MG PO TABS: ORAL_TABLET | ORAL | 0 refills | Status: DC

## 2019-03-13 NOTE — Telephone Encounter (Signed)
Rx norco and naprosyn sent to pt pharmacy.

## 2019-03-14 ENCOUNTER — Encounter: Payer: Self-pay | Admitting: Medical

## 2019-03-14 ENCOUNTER — Other Ambulatory Visit (HOSPITAL_COMMUNITY): Payer: Self-pay | Admitting: Psychiatry

## 2019-03-14 DIAGNOSIS — F330 Major depressive disorder, recurrent, mild: Secondary | ICD-10-CM

## 2019-03-14 MED ORDER — NAPROXEN 500 MG PO TABS: 500.0000 mg | ORAL_TABLET | Freq: Two times a day (BID) | ORAL | 0 refills | Status: DC

## 2019-03-14 MED ORDER — HYDROCODONE-ACETAMINOPHEN 5-325 MG PO TABS: ORAL_TABLET | ORAL | 0 refills | Status: DC

## 2019-03-14 NOTE — Telephone Encounter (Signed)
Pt called back and reported that current pharmacy has system outage and are unable to process his refill. Pt needs both of these prescriptions sent over as soon as possible to new [place], [place]  [place] [address]  Phone: [phone] Fax: [phone]

## 2019-03-14 NOTE — Addendum Note (Signed)
Addended by: Anabel Halon on: 03/14/2019 04:19 PM   Modules accepted: Orders

## 2019-03-14 NOTE — Telephone Encounter (Signed)
Sent to different pharmacy. Since other system is down.

## 2019-03-15 ENCOUNTER — Encounter (HOSPITAL_BASED_OUTPATIENT_CLINIC_OR_DEPARTMENT_OTHER): Payer: Self-pay | Admitting: Emergency Medicine

## 2019-03-15 ENCOUNTER — Emergency Department (HOSPITAL_BASED_OUTPATIENT_CLINIC_OR_DEPARTMENT_OTHER)
Admission: EM | Admit: 2019-03-15 | Discharge: 2019-03-15 | Disposition: A | Discharge status: Home / Self Care | Payer: BC Managed Care – PPO | Attending: Emergency Medicine | Admitting: Emergency Medicine

## 2019-03-15 ENCOUNTER — Other Ambulatory Visit: Payer: Self-pay

## 2019-03-15 DIAGNOSIS — M542 Cervicalgia: Secondary | ICD-10-CM | POA: Diagnosis not present

## 2019-03-15 DIAGNOSIS — Z888 Allergy status to other drugs, medicaments and biological substances status: Secondary | ICD-10-CM | POA: Diagnosis not present

## 2019-03-15 DIAGNOSIS — G8929 Other chronic pain: Secondary | ICD-10-CM | POA: Diagnosis not present

## 2019-03-15 DIAGNOSIS — Z79899 Other long term (current) drug therapy: Secondary | ICD-10-CM | POA: Diagnosis not present

## 2019-03-15 DIAGNOSIS — Z87891 Personal history of nicotine dependence: Secondary | ICD-10-CM | POA: Insufficient documentation

## 2019-03-15 MED ORDER — KETOROLAC TROMETHAMINE 30 MG/ML IJ SOLN
30.0000 mg | Freq: Once | INTRAMUSCULAR | Status: AC
  Administered 2019-03-15: 30 mg via INTRAMUSCULAR
  Filled 2019-03-15: qty 1

## 2019-03-15 MED ORDER — DEXAMETHASONE SODIUM PHOSPHATE 10 MG/ML IJ SOLN
10.0000 mg | Freq: Once | INTRAMUSCULAR | Status: AC
  Administered 2019-03-15: 10 mg via INTRAMUSCULAR
  Filled 2019-03-15: qty 1

## 2019-03-15 NOTE — ED Provider Notes (Signed)
[facility] EMERGENCY DEPARTMENT Provider Note   CSN: [id number] Arrival date & time: 03/15/19  1124     History   Chief Complaint Chief Complaint  Patient presents with  . Headache  . Neck Pain    HPI Angel [name] is a 63 y.o. male with a past medical history of cervical disc disease presenting to the ED with a chief complaint of neck pain.  States that the pain is sharp, located on left side of his neck and giving him a migraine.  He has had issues similar to this for the past 2 years.  He is scheduled for an injection with his specialist in 4 days but he is here for treatment with Toradol which usually works for him.  He was told in the past that he had cervical disc disease and states this feels similar.  Denies any vision changes, numbness in arms or legs, injuries or falls, vomiting, rash or fever.     HPI  Past Medical History:  Diagnosis Date  . Bipolar disorder ([place])   . 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 Depression   . GERD (gastroesophageal reflux disease)   . Headache   . Hepatitis B    Hepatitis E. antigen positive by history, status post treatment with Hepsera and baraclude   . Hepatitis B carrier ([place])   . Personal history of colonic polyps    Adenomas polyp 2008 and 2010, 2015   . Weight loss    Pt. has lost 10 lbs since pre-visit, intentionally with diet and walking    Patient Active Problem List   Diagnosis Date Noted  . Hyperglycemia 10/07/2013  . Hepatic cirrhosis ([place]) 10/03/2013  . Bipolar I disorder, most recent episode (or current) depressed, unspecified 05/21/2013  . Hepatitis B 03/21/2013  . Esophageal reflux 03/21/2013  . Personal history of colonic polyps 03/21/2013  . Bipolar disorder, unspecified ([place]) 09/03/2011    Past Surgical History:  Procedure Laterality Date  . APPENDECTOMY    . COLONOSCOPY    . POLYPECTOMY    . UPPER GASTROINTESTINAL ENDOSCOPY          Home Medications    Prior to Admission  medications   Medication Sig Start Date End Date Taking? Authorizing Provider  bisacodyl (BISACODYL) 5 MG EC tablet TAKE 1 TABLET BY MOUTH EVERY DAY AS NEEDED FOR MODERATE CONSTIPATION 12/24/18  Yes Saguier, Percell Miller, PA-C  clonazePAM (KLONOPIN) 1 MG tablet 2  qhs 12/11/18  Yes Plovsky, Berneta Sages, MD  diphenhydrAMINE (BENADRYL) 25 MG tablet Take 75 mg by mouth at bedtime as needed.   Yes [provider]  divalproex (DEPAKOTE ER) 250 MG 24 hr tablet 1  qhs  For 2 days  Then  1 bid   For   3  Days  Then 1 qam  And  2  qhs 12/23/18  Yes Plovsky, Berneta Sages, MD  Inulin-Cholecalciferol (FIBER/D3 ADULT GUMMIES) 2.5-500 GM-UNIT CHEW 1 tab po q day 07/16/18  Yes Saguier, Percell Miller, PA-C  lithium 600 MG capsule 1 qam   2  qhs 12/23/18  Yes Plovsky, Berneta Sages, MD  Melatonin 10 MG SUBL Place 10 mg under the tongue at bedtime.   Yes [provider]  methocarbamol (ROBAXIN) 750 MG tablet Take 1 tablet (750 mg total) by mouth 4 (four) times daily. 02/28/19  Yes Saguier, Percell Miller, PA-C  nadolol (CORGARD) 40 MG tablet TAKE 1 TABLET BY MOUTH EVERY DAY 06/02/14  Yes Inda Castle, MD  naproxen (NAPROSYN)  500 MG tablet Take 1 tablet (500 mg total) by mouth 2 (two) times daily. 03/14/19  Yes Saguier, Percell Miller, PA-C  traZODone (DESYREL) 50 MG tablet TAKE 3 TABLETS DAILY AT BEDTIME AND MAY REPEAT WITH ONE MORE TABLET 12/23/18  Yes Plovsky, Berneta Sages, MD  venlafaxine XR (EFFEXOR-XR) 150 MG 24 hr capsule Take 1 capsule (150 mg total) by mouth every morning. 12/23/18  Yes Plovsky, Berneta Sages, MD  HYDROcodone-acetaminophen [facility]) 5-325 MG tablet 1 tab po bid prn pain 03/14/19   Saguier, Percell Miller, PA-C    Family History Family History  Problem Relation Age of Onset  . Heart disease Mother   . Diabetes Father   . Kidney failure Father   . Kidney disease Father   . Other Sister        GERD  . Colon cancer Neg Hx   . Rectal cancer Neg Hx   . Stomach cancer Neg Hx   . Esophageal cancer Neg Hx     Social History Social History    Tobacco Use  . Smoking status: Former Smoker    Types: Cigarettes    Quit date: 05/08/2009    Years since quitting: 9.8  . Smokeless tobacco: Never Used  Substance Use Topics  . Alcohol use: No    Alcohol/week: 0.0 standard drinks  . Drug use: No     Allergies   Aripiprazole, Invega [paliperidone er], Abilify [aripiprazole], Lamictal [lamotrigine], and Lexapro [escitalopram oxalate]   Review of Systems Review of Systems  Constitutional: Negative for appetite change, chills and fever.  HENT: Negative for ear pain, rhinorrhea, sneezing and sore throat.   Eyes: Negative for photophobia and visual disturbance.  Respiratory: Negative for cough, chest tightness, shortness of breath and wheezing.   Cardiovascular: Negative for chest pain and palpitations.  Gastrointestinal: Negative for abdominal pain, blood in stool, constipation, diarrhea, nausea and vomiting.  Genitourinary: Negative for dysuria, hematuria and urgency.  Musculoskeletal: Positive for myalgias and neck pain.  Skin: Negative for rash.  Neurological: Positive for headaches. Negative for dizziness, weakness and light-headedness.     Physical Exam Updated Vital Signs BP (!) 152/78 (BP Location: Right Arm)   Pulse 64   Temp 98.7 F (37.1 C) (Oral)   Resp 18   Ht 5\' 10"  (1.778 m)   Wt 106.1 kg   SpO2 94%   BMI 33.58 kg/m   Physical Exam Vitals signs and nursing note reviewed.  Constitutional:      General: He is not in acute distress.    Appearance: He is well-developed.  HENT:     Head: Normocephalic and atraumatic.     Nose: Nose normal.  Eyes:     General: No scleral icterus.       Left eye: No discharge.     Conjunctiva/sclera: Conjunctivae normal.  Neck:     Musculoskeletal: Normal range of motion and neck supple. Muscular tenderness present.      Comments: Full range of motion of neck without difficulty.  Tenderness palpation of the left paraspinal musculature of the cervical spine.  Cardiovascular:     Rate and Rhythm: Normal rate and regular rhythm.     Heart sounds: Normal heart sounds. No murmur. No friction rub. No gallop.   Pulmonary:     Effort: Pulmonary effort is normal. No respiratory distress.     Breath sounds: Normal breath sounds.  Abdominal:     General: Bowel sounds are normal. There is no distension.     Palpations: Abdomen is soft.  Tenderness: There is no abdominal tenderness. There is no guarding.  Musculoskeletal: Normal range of motion.  Skin:    General: Skin is warm and dry.     Findings: No rash.  Neurological:     General: No focal deficit present.     Mental Status: He is alert and oriented to person, place, and time.     Cranial Nerves: No cranial nerve deficit.     Sensory: No sensory deficit.     Motor: No weakness or abnormal muscle tone.     Coordination: Coordination normal.      ED Treatments / Results  Labs (all labs ordered are listed, but only abnormal results are displayed) Labs Reviewed - No data to display  EKG None  Radiology No results found.  Procedures Procedures (including critical care time)  Medications Ordered in ED Medications  ketorolac (TORADOL) 30 MG/ML injection 30 mg (30 mg Intramuscular Given 03/15/19 1212)  dexamethasone (DECADRON) injection 10 mg (10 mg Intramuscular Given 03/15/19 1208)     Initial Impression / Assessment and Plan / ED Course  I have reviewed the triage vital signs and the nursing notes.  Pertinent labs & imaging results that were available during my care of the patient were reviewed by me and considered in my medical decision making (see chart for details).        63 year old male presents to the ED for acute on chronic left-sided neck pain related to his cervical disc disease.  States that Toradol has helped him in the past.  Pain usually gets a migraine which she is having currently.  Scheduled for injection with his specialist in 4 days.  On exam tenderness  palpation of the left paraspinal musculature of the cervical spine without changes to range of motion.  Denies any fever or neck stiffness. Suspect flare up of his chronic pain related to degenerative disc disease.  Doubt infectious or vascular cause.  Patient with significant improvement with Toradol and Decadron.  We will have him follow-up with specialist, continue home medications and return for worsening symptoms.  Patient is hemodynamically stable, in NAD, and able to ambulate in the ED. Evaluation does not show pathology that would require ongoing emergent intervention or inpatient treatment. I explained the diagnosis to the patient. Pain has been managed and has no complaints prior to discharge. Patient is comfortable with above plan and is stable for discharge at this time. All questions were answered prior to disposition. Strict return precautions for returning to the ED were discussed. Encouraged follow up with PCP.   An After Visit Summary was printed and given to the patient.   Portions of this note were generated with [employer]. Dictation errors may occur despite best attempts at proofreading.   Final Clinical Impressions(s) / ED Diagnoses   Final diagnoses:  Chronic neck pain    ED Discharge Orders    None       Delia Heady, PA-C 03/15/19 1304    Virgel Manifold, MD 03/16/19 [phone]

## 2019-03-15 NOTE — ED Triage Notes (Signed)
Ongoing head and neck pain. Scheduled for an injection next week and has not been able to get his rx of pain meds.

## 2019-03-15 NOTE — Discharge Instructions (Signed)
Continue your home medications as previously prescribed. Return to the ED if you start to have worsening symptoms, develop neck stiffness, fever, rash, chest pain or shortness of breath.

## 2019-03-17 ENCOUNTER — Other Ambulatory Visit: Payer: Self-pay

## 2019-03-17 ENCOUNTER — Telehealth (HOSPITAL_COMMUNITY): Payer: Self-pay

## 2019-03-17 ENCOUNTER — Telehealth: Payer: Self-pay | Admitting: *Deleted

## 2019-03-17 ENCOUNTER — Encounter: Payer: Self-pay | Admitting: Medical

## 2019-03-17 ENCOUNTER — Ambulatory Visit (INDEPENDENT_AMBULATORY_CARE_PROVIDER_SITE_OTHER): Payer: BC Managed Care – PPO | Admitting: Medical

## 2019-03-17 VITALS — BP 133/72 | HR 65 | Temp 96.5°F | Resp 16 | Ht 70.0 in | Wt 237.0 lb

## 2019-03-17 DIAGNOSIS — R519 Headache, unspecified: Secondary | ICD-10-CM

## 2019-03-17 DIAGNOSIS — R739 Hyperglycemia, unspecified: Secondary | ICD-10-CM | POA: Diagnosis not present

## 2019-03-17 DIAGNOSIS — M542 Cervicalgia: Secondary | ICD-10-CM | POA: Diagnosis not present

## 2019-03-17 LAB — COMPREHENSIVE METABOLIC PANEL
ALT: 32 U/L (ref 0–53)
AST: 21 U/L (ref 0–37)
Albumin: 3.9 g/dL (ref 3.5–5.2)
Alkaline Phosphatase: 57 U/L (ref 39–117)
BUN: 16 mg/dL (ref 6–23)
CO2: 28 mEq/L (ref 19–32)
Calcium: 8.8 mg/dL (ref 8.4–10.5)
Chloride: 110 mEq/L (ref 96–112)
Creatinine, Ser: 0.91 mg/dL (ref 0.40–1.50)
GFR: 83.87 mL/min (ref >60.00)
Glucose, Bld: 100 mg/dL — ABNORMAL HIGH (ref 70–99)
Potassium: 4.3 mEq/L (ref 3.5–5.1)
Sodium: 144 mEq/L (ref 135–145)
Total Bilirubin: 0.3 mg/dL (ref 0.2–1.2)
Total Protein: 6.1 g/dL (ref 6.0–8.3)

## 2019-03-17 MED ORDER — KETOROLAC TROMETHAMINE 30 MG/ML IJ SOLN
30.0000 mg | Freq: Once | INTRAMUSCULAR | Status: AC
  Administered 2019-03-17: 30 mg via INTRAMUSCULAR

## 2019-03-17 NOTE — Telephone Encounter (Signed)
Just FYI Looks like patient sent several [employer].   Patient did go to ER.

## 2019-03-17 NOTE — Patient Instructions (Signed)
You do have reoccurring headache as well as a neck pain.  By your description and history it does appear that these are associated.  Am hopeful  once you get the epidural injection that you are headaches will subside as you report that 3 to 4 days after last injection you had significant improvement.  You have recent use of Norco and a muscle relaxant.  I explained today that the Norco is only for limited use until you can get through with the epidural.  Then ideally the headache and neck pain will be under control by next week.  If not then will refer you to headache specialist.  Presently we gave you Toradol 30 mg IM and advised not to use any Naprosyn for the next 24 hours.  Will check your metabolic panel today.  You have some recent constipation since using Norco and you can try magnesium citrate today.  Follow-up next Tuesday virtual visit or as needed.

## 2019-03-17 NOTE — Progress Notes (Signed)
Subjective:    Patient ID: Angel [name], male    DOB: 01/15/1956, 63 y.o.   MRN: [id number]  HPI  Pt in for follow up. Hx of HA associated with neck pain. See prior visit with me and well as ED visits.    Pt got the prescription of norco filled on Saturday. Pt states told delay due to power outage? I called pharmacy and they verified rx was filled on 03-15-2019.  Pt ended up having to go to ED this weekend.  Pt states recent constipation with norco. I explained to pt that I don't want to rx norco routine basis long term but short term rx until he can see neurosurgeon for injection 2 days from today.  He does have ha again this morning. He used naprosyn, methocarbinol and norco this morning. No gross motor or sensory function deficits. Ha level about 5/10 today.    Review of Systems  Constitutional: Negative for chills, fatigue and fever.  Respiratory: Negative for cough, chest tightness, shortness of breath and wheezing.   Musculoskeletal: Negative for back pain, myalgias and neck stiffness.  Skin: Negative for rash.  Neurological: Negative for dizziness, tremors, seizures and headaches.  Hematological: Negative for adenopathy. Does not bruise/bleed easily.  Psychiatric/Behavioral: Negative for confusion, dysphoric mood, sleep disturbance and suicidal ideas. The patient is not nervous/anxious.     Past Medical History:  Diagnosis Date  . Bipolar disorder ([place])   . 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 Depression   . GERD (gastroesophageal reflux disease)   . Headache   . Hepatitis B    Hepatitis E. antigen positive by history, status post treatment with Hepsera and baraclude   . Hepatitis B carrier ([place])   . Personal history of colonic polyps    Adenomas polyp 2008 and 2010, 2015   . Weight loss    Pt. has lost 10 lbs since pre-visit, intentionally with diet and walking     Social History   Socioeconomic History  . Marital status: Married    Spouse  name: [name]  . Number of children: 0  . Years of education: Not on file  . Highest education level: Master's degree (e.g., MA, MS, MEng, MEd, MSW, MBA)  Occupational History  . 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 Financial resource strain: Not on file  . Food insecurity    Worry: Not on file    Inability: Not on file  . Transportation needs    Medical: Not on file    Non-medical: Not on file  Tobacco Use  . Smoking status: Former Smoker    Types: Cigarettes    Quit date: 05/08/2009    Years since quitting: 9.8  . Smokeless tobacco: Never Used  Substance and Sexual Activity  . Alcohol use: No    Alcohol/week: 0.0 standard drinks  . Drug use: No  . Sexual activity: Not Currently  Lifestyle  . Physical activity    Days per week: Not on file    Minutes per session: Not on file  . Stress: Not on file  Relationships  . Social Herbalist on phone: Not on file    Gets together: Not on file    Attends religious service: Not on file    Active member of club or organization: Not on file    Attends meetings of clubs or organizations: Not on file    Relationship status: Not on  file  . Intimate partner violence    Fear of current or ex partner: Not on file    Emotionally abused: Not on file    Physically abused: Not on file    Forced sexual activity: Not on file  Other Topics Concern  . Not on file  Social History Narrative   Patient is right-handed. He is married to same sex partner. He drinks 6-9 glasses of tea a day. He gets little exercise.    Past Surgical History:  Procedure Laterality Date  . APPENDECTOMY    . COLONOSCOPY    . POLYPECTOMY    . UPPER GASTROINTESTINAL ENDOSCOPY      Family History  Problem Relation Age of Onset  . Heart disease Mother   . Diabetes Father   . Kidney failure Father   . Kidney disease Father   . Other Sister        GERD  . Colon cancer Neg Hx   . Rectal cancer Neg Hx   . Stomach cancer  Neg Hx   . Esophageal cancer Neg Hx     Allergies  Allergen Reactions  . Aripiprazole Other (See Comments)    Tardive Dyskinesia  . Invega [Paliperidone Er] Other (See Comments)    Tardive dyskinsia symptoms  . Abilify [Aripiprazole]   . Lamictal [Lamotrigine] Rash  . Lexapro [Escitalopram Oxalate] Other (See Comments)    Doesn't Work    Current Outpatient Medications on File Prior to Visit  Medication Sig Dispense Refill  . bisacodyl (BISACODYL) 5 MG EC tablet TAKE 1 TABLET BY MOUTH EVERY DAY AS NEEDED FOR MODERATE CONSTIPATION 90 tablet 3  . clonazePAM (KLONOPIN) 1 MG tablet 2  qhs 60 tablet 5  . diphenhydrAMINE (BENADRYL) 25 MG tablet Take 75 mg by mouth at bedtime as needed.    . divalproex (DEPAKOTE ER) 250 MG 24 hr tablet 1  qhs  For 2 days  Then  1 bid   For   3  Days  Then 1 qam  And  2  qhs 90 tablet 5  . HYDROcodone-acetaminophen (NORCO) 5-325 MG tablet 1 tab po bid prn pain 14 tablet 0  . Inulin-Cholecalciferol (FIBER/D3 ADULT GUMMIES) 2.5-500 GM-UNIT CHEW 1 tab po q day 90 tablet 3  . lithium 600 MG capsule 1 qam   2  qhs 90 capsule 5  . Melatonin 10 MG SUBL Place 10 mg under the tongue at bedtime.    . methocarbamol (ROBAXIN) 750 MG tablet Take 1 tablet (750 mg total) by mouth 4 (four) times daily. 120 tablet 1  . nadolol (CORGARD) 40 MG tablet TAKE 1 TABLET BY MOUTH EVERY DAY 30 tablet 0  . naproxen (NAPROSYN) 500 MG tablet Take 1 tablet (500 mg total) by mouth 2 (two) times daily. 20 tablet 0  . traZODone (DESYREL) 50 MG tablet TAKE 3 TABLETS DAILY AT BEDTIME AND MAY REPEAT WITH ONE MORE TABLET 100 tablet 12  . venlafaxine XR (EFFEXOR-XR) 150 MG 24 hr capsule Take 1 capsule (150 mg total) by mouth every morning. 90 capsule 0   No current facility-administered medications on file prior to visit.     BP 133/72   Pulse 65   Temp (!) 96.5 F (35.8 C) (Temporal)   Resp 16   Ht 5\' 10"  (1.778 m)   Wt 237 lb (107.5 kg)   SpO2 98%   BMI 34.01 kg/m         Objective:   Physical Exam  General Mental  Status- Alert. General Appearance- Not in acute distress.   Skin General: Color- Normal Color. Moisture- Normal Moisture.  Neck Carotid Arteries- Normal color. Moisture- Normal Moisture. No carotid bruits. No JVD.  Chest and Lung Exam Auscultation: Breath Sounds:-Normal.  Cardiovascular Auscultation:Rythm- Regular. Murmurs & Other Heart Sounds:Auscultation of the heart reveals- No Murmurs.  Abdomen Inspection:-Inspeection Normal. Palpation/Percussion:Note:No mass. Palpation and Percussion of the abdomen reveal- Non Tender, Non Distended + BS, no rebound or guarding.  Neurologic Cranial Nerve exam:- CN III-XII intact(No nystagmus), symmetric smile. Strength:- 5/5 equal and symmetric strength both upper and lower extremities.      Assessment & Plan:  You do have reoccurring headache as well as a neck pain.  By your description and history it does appear that these are associated.  Am hopeful that once you get the epidural injection that you are headaches will subside as you report that 3 to 4 days after last injection you had significant improvement.  You have recent use of Norco and a muscle relaxant.  I explained today that the Norco is only for limited use until you can get through with the epidural.  Then ideally the headache and neck pain will be under control by next week.  If not then will refer you to headache specialist.  Presently we gave you Toradol 30 mg IM and advised not to use any Naprosyn for the next 24 hours.  Will check your metabolic panel today.  You have some recent constipation since using Norco and you can try magnesium citrate today.  Follow-up next Tuesday virtual visit or as needed.  25 minute spent with pt. 50% of time spent counseling pt on plan going forward.  [name], PA-C

## 2019-03-17 NOTE — Telephone Encounter (Signed)
I already sent his prescription to 2 pharmacies. Wanted Mel Almond to call first pharmacy and cancel rx that was pending. She pointed out that they could not cancel out rx if system down. On Friday so I sent to other with concern that 1st pharmacy might fill once there system came back on line. So not sure what I can do. I am busy seeing pt. Will you call pharmacies and ask how to solve.

## 2019-03-17 NOTE — Telephone Encounter (Signed)
Patient called, he said he has been taking 2 Klonopin at bedtime, but also one during the day sometimes and he would like to see if you can increase the prescription. Patient has been scheduled for an in person visit 12/4, and added to the wait list. Please review and advise, thank you

## 2019-03-17 NOTE — Telephone Encounter (Signed)
Who Is Calling Patient / Member / Family / Caregiver Call Type Triage / Clinical Relationship To Patient Self Return Phone Number [phone] (Primary) Chief Complaint Headache Reason for Call Medication Question / Request Initial Comment Caller states they have been trying to get a prescription since 8am on Thursday. [facility] has stated they have it but it was denied. They called their insurance and they were going to call the pharmacy and explain how to code the hydrocodone and naproxen so it could be filled. Nurse was supposed to send it to a different pharmacy and there was system trouble. A re-fill is showing up online. They were told that they could not move the hydrocodone to another pharmacy which won't allow people to take prescriptions out because of system issues. Caller needs their hydrocodone so they don't have to go to the ER. Caller gets migraines and has neck pain. They will be having an epidural in a few weeks. Caller was given a shot of teradol as well. Caller is bi-polar and has liver disease as well. Translation No Nurse Assessment Nurse: Lenna Gilford, RN, Melissa Date/Time (Eastern Time): 03/14/2019 8:16:54 PM

## 2019-03-17 NOTE — Telephone Encounter (Signed)
Patient seen in office today. 

## 2019-03-19 ENCOUNTER — Encounter: Payer: Self-pay | Admitting: Physical Medicine and Rehabilitation

## 2019-03-19 ENCOUNTER — Ambulatory Visit: Payer: Self-pay

## 2019-03-19 ENCOUNTER — Ambulatory Visit (INDEPENDENT_AMBULATORY_CARE_PROVIDER_SITE_OTHER): Payer: BC Managed Care – PPO | Admitting: Physical Medicine and Rehabilitation

## 2019-03-19 ENCOUNTER — Other Ambulatory Visit: Payer: Self-pay

## 2019-03-19 VITALS — BP 132/77 | HR 53

## 2019-03-19 DIAGNOSIS — M5010 Cervical disc disorder with radiculopathy, unspecified cervical region: Secondary | ICD-10-CM

## 2019-03-19 DIAGNOSIS — M542 Cervicalgia: Secondary | ICD-10-CM | POA: Diagnosis not present

## 2019-03-19 DIAGNOSIS — M5412 Radiculopathy, cervical region: Secondary | ICD-10-CM | POA: Diagnosis not present

## 2019-03-19 DIAGNOSIS — G894 Chronic pain syndrome: Secondary | ICD-10-CM | POA: Diagnosis not present

## 2019-03-19 MED ORDER — HYDROCODONE-ACETAMINOPHEN 5-325 MG PO TABS: ORAL_TABLET | ORAL | 0 refills | Status: AC

## 2019-03-19 MED ORDER — METHYLPREDNISOLONE ACETATE 80 MG/ML IJ SUSP
80.0000 mg | Freq: Once | INTRAMUSCULAR | Status: AC
  Administered 2019-03-19: 80 mg

## 2019-03-19 NOTE — Progress Notes (Signed)
 .  Numeric Pain Rating Scale and Functional Assessment Average Pain 8   In the last MONTH (on 0-10 scale) has pain interfered with the following?  1. General activity like being  able to carry out your everyday physical activities such as walking, climbing stairs, carrying groceries, or moving a chair?  Rating(8)   +Driver, -BT, -Dye Allergies.  

## 2019-03-19 NOTE — Telephone Encounter (Signed)
Closing encounter

## 2019-03-21 ENCOUNTER — Encounter: Payer: Self-pay | Admitting: Physical Medicine and Rehabilitation

## 2019-03-21 ENCOUNTER — Encounter: Payer: Self-pay | Admitting: Medical

## 2019-03-21 NOTE — Progress Notes (Signed)
Angel [name] - 63 y.o. male MRN [id number]  Date of birth: 03-10-1956  Office Visit Note: Visit Date: 03/19/2019 PCP: [name], PA-C Referred by: Elise Benne  Subjective: Chief Complaint  Patient presents with   Neck - Pain   Head - Pain   HPI: Angel [name] is a 63 y.o. male who comes in today Evaluation management of chronic severe neck pain.  He also suffers from occipital headache that he feels is related.  I have not seen the patient since August 2019.  We completed cervical epidural injection for neck pain.  He was being followed by Dr. Jerilynn Som from an orthopedic standpoint and was having neck and shoulder pain and ultimately had MRI of the cervical spine showing multilevel spondylosis with some level of stenosis but no high-grade nerve compression.  Epidural injection evidently helped at the time and we have not seen him back.  He comes in today saying he has had 3 to 4 weeks of severe neck and occipital headache.  He does not endorse any specific trauma or falls or any other reason to have the onset of pain.  He reports no pain down the arms no paresthesia.  He has had no focal weakness he has had no gait disturbances.  Headache is occipital sometimes bandlike headache without aura.  No real phonophobia or light sensitivity.  His case is complicated by bipolar disorder type I as well as liver cirrhosis.  Looking through the record he initially made contact with [place] and wanted IV pain medications and to be admitted into the hospital for IV pain medications.  Because we had not seen him since August and we would not do that in general for anyone we told him that we did need to see him for evaluation.  At that time he was speaking to his primary care provider [name], PA-C requesting IV pain medications and admission to the hospital.  He also told the patient that that would not be something that would be doable.  The patient initially wanted muscle relaxer and  then that seemed to progress of the pain medication.  His primary care provider did ultimately see him in the office and through telemedicine and provided short-term hydrocodone and muscle relaxer.  Interestingly there is also a phone call or message in the chart to the [facility] transplant service requesting pain medication and that was also met with the patient should follow-up in [place].  The patient did go to the emergency department on 2 occasions did receive Toradol as well as muscle relaxer.  Patient is taking hydrocodone 5 mg 3 times per day and he tells me is doing this because the instructions said to take it that often.  I did tell him it said to take it as needed not as a scheduled medication.  He reports that he was taking too many Tylenols and has reduced that.  He rates his pain as an 8 out of 10 but he seems pretty comfortable in the office.  He has not seen a headache specialist.  Review of Systems  Constitutional: Negative for chills, fever, malaise/fatigue and weight loss.  HENT: Negative for hearing loss and sinus pain.   Eyes: Negative for blurred vision, double vision and photophobia.  Respiratory: Negative for cough and shortness of breath.   Cardiovascular: Negative for chest pain, palpitations and leg swelling.  Gastrointestinal: Negative for abdominal pain, nausea and vomiting.  Genitourinary: Negative for flank pain.  Musculoskeletal: Positive for neck  pain. Negative for myalgias.  Skin: Negative for itching and rash.  Neurological: Positive for headaches. Negative for tremors, focal weakness and weakness.  Endo/Heme/Allergies: Negative.   Psychiatric/Behavioral: Negative for depression.  All other systems reviewed and are negative.  Otherwise per HPI.  Assessment & Plan: Visit Diagnoses:  1. Cervical radiculopathy   2. Cervicalgia   3. Cervical disc disorder with radiculopathy   4. Chronic pain syndrome     Plan: Findings:  Chronic history of neck pain that was  relieved by his history with epidural injection in August of last year.  He now has severe neck and headache pain which is occipital which seems to be more tension type headache but he has not seen a headache specialist in the past.  He has had no trauma no real need for further imaging.  His pain has been severe enough to request pain medication from multiple physicians as well as emergency department visit with injections of Toradol muscle relaxers etc.  He has been using hydrocodone and increasing the dose so that he is consistently taking 3/day.  I have suggested to him that hydrocodone is not going to be a good source of pain relief for him now or in the future.  I have told him is not something we do here in this office as far as managing anything like this long-term.  Because he only has 4 tablets left and he has been taking this consistently, I am going to write a prescription for the hydrocodone with instructions for weaning down and weaning off status post epidural injection that were going to do today.  If epidural injection of the cervical spine does not help his pain he needs to follow-up with his primary care provider and they may need to look at chronic pain management for him.  On the MRI that we have there is nothing overtly surgical and really nothing that would cause directly occipital headaches.  Would suggest referral to Dr. Sarina Ill for headache management or other headache center.    Meds & Orders:  Meds ordered this encounter  Medications   methylPREDNISolone acetate (DEPO-MEDROL) injection 80 mg   HYDROcodone-acetaminophen (NORCO/VICODIN) 5-325 MG tablet    Sig: Take 1 tablet by mouth 2 (two) times daily for 3 days, THEN 1 tablet daily for 3 days. Then Stop.    Dispense:  10 tablet    Refill:  0    Orders Placed This Encounter  Procedures   XR C-ARM NO REPORT   Epidural Steroid injection    Follow-up: Return if symptoms worsen or fail to improve.   Procedures: No  procedures performed  Cervical Epidural Steroid Injection - Interlaminar Approach with Fluoroscopic Guidance  Patient: Angel [name]      Date of Birth: 02-08-1956 MRN: [id number] PCP: [name], PA-C      Visit Date: 03/19/2019   Universal Protocol:    Date/Time: 11/13/205:54 AM  Consent Given By: the patient  Position: PRONE  Additional Comments: Vital signs were monitored before and after the procedure. Patient was prepped and draped in the usual sterile fashion. The correct patient, procedure, and site was verified.   Injection Procedure Details:  Procedure Site One Meds Administered:  Meds ordered this encounter  Medications   methylPREDNISolone acetate (DEPO-MEDROL) injection 80 mg   HYDROcodone-acetaminophen (NORCO/VICODIN) 5-325 MG tablet    Sig: Take 1 tablet by mouth 2 (two) times daily for 3 days, THEN 1 tablet daily for 3 days. Then Stop.  Dispense:  10 tablet    Refill:  0     Laterality: Left  Location/Site: C7-T1  Needle size: 20 G  Needle type: Touhy  Needle Placement: Paramedian epidural space  Findings:  -Comments: Excellent flow of contrast into the epidural space.  Procedure Details: Using a paramedian approach from the side mentioned above, the region overlying the inferior lamina was localized under fluoroscopic visualization and the soft tissues overlying this structure were infiltrated with 4 ml. of 1% Lidocaine without Epinephrine. A # 20 gauge, Tuohy needle was inserted into the epidural space using a paramedian approach.  The epidural space was localized using loss of resistance along with lateral and contralateral oblique bi-planar fluoroscopic views.  After negative aspirate for air, blood, and CSF, a 2 ml. volume of Isovue-250 was injected into the epidural space and the flow of contrast was observed. Radiographs were obtained for documentation purposes.   The injectate was administered into the level noted  above.  Additional Comments:  The patient tolerated the procedure well Dressing: 2 x 2 sterile gauze and Band-Aid    Post-procedure details: Patient was observed during the procedure. Post-procedure instructions were reviewed.  Patient left the clinic in stable condition.   Clinical History: MRI CERVICAL SPINE WITHOUT CONTRAST  TECHNIQUE: Multiplanar, multisequence MR imaging of the cervical spine was performed. No intravenous contrast was administered.  COMPARISON:  None available.  FINDINGS: Alignment: Straightening of the normal cervical lordosis. No listhesis.  Vertebrae: Vertebral body heights are maintained without evidence for acute or chronic fracture. Bone marrow signal intensity within normal limits. Benign hemangioma noted within the T1 vertebral body. Prominent reactive endplate changes present about the C5-6 interspace. No worrisome osseous lesions.  Cord: Signal intensity within the cervical spinal cord is normal.  Posterior Fossa, vertebral arteries, paraspinal tissues: Visualized brain and posterior fossa within normal limits. Craniocervical junction normal. Paraspinous and prevertebral soft tissues normal. Normal intravascular flow voids present within the vertebral arteries bilaterally.  Disc levels:  C2-C3: Mild uncovertebral hypertrophy. Right C2-3 facets appear fused. No significant stenosis.  C3-C4: Left eccentric disc bulge with left greater than right uncovertebral hypertrophy. Right greater than left facet hypertrophy. No significant spinal stenosis. Mild bilateral C4 foraminal narrowing.  C4-C5: Diffuse disc bulge with bilateral uncovertebral hypertrophy. Posterior disc osteophyte flattens the ventral thecal sac results in mild spinal stenosis. Moderate left with mild right C5 foraminal narrowing.  C5-C6: Chronic circumferential disc osteophyte complex with intervertebral disc space narrowing. Broad posterior  component flattens the ventral thecal sac with mild spinal stenosis. Moderate to severe right with moderate left C6 foraminal narrowing.  C6-C7: Mild disc bulge with uncovertebral hypertrophy. No significant stenosis.  C7-T1:  Minimal facet hypertrophy on the left.  No stenosis.  Visualized upper thoracic spine within normal limits.  IMPRESSION: 1. Degenerative disc osteophyte at C4-5 and C5-6 with resultant mild spinal stenosis. 2. Multifactorial degenerative changes with resultant multilevel foraminal narrowing as above. Notable findings include moderate left C5 foraminal stenosis, with moderate to severe right and moderate left C6 foraminal narrowing.   Electronically Signed   By: Jeannine Boga M.D.   On: 10/28/2017 06:12   He reports that he quit smoking about 9 years ago. His smoking use included cigarettes. He has never used smokeless tobacco.  Recent Labs    02/28/19 1028  HGBA1C 6.3    Objective:  VS:  HT:     WT:    BMI:      BP:132/77   HR:(!) 53bpm  TEMP: ( )   RESP:  Physical Exam Vitals signs and nursing note reviewed.  Constitutional:      General: He is not in acute distress.    Appearance: He is well-developed. He is obese.  HENT:     Head: Normocephalic and atraumatic.     Nose: Nose normal.     Mouth/Throat:     Mouth: Mucous membranes are moist.     Pharynx: Oropharynx is clear.  Eyes:     Conjunctiva/sclera: Conjunctivae normal.     Pupils: Pupils are equal, round, and reactive to light.  Neck:     Musculoskeletal: Normal range of motion and neck supple. Muscular tenderness present. No neck rigidity.     Trachea: No tracheal deviation.  Cardiovascular:     Rate and Rhythm: Normal rate and regular rhythm.     Pulses: Normal pulses.  Pulmonary:     Effort: Pulmonary effort is normal.     Breath sounds: Normal breath sounds.  Abdominal:     General: There is no distension.     Palpations: Abdomen is soft.     Tenderness: There  is no guarding or rebound.  Musculoskeletal:        General: No deformity.     Right lower leg: No edema.     Left lower leg: No edema.     Comments: Patient has cervical forward flexion pain with extension and forward flexion some with rotation.  Does not seem limited in rotation.  Has active trigger points in the levator scapula and rhomboids and trapezius.  He has mild shoulder impingement right more than left.  He has good strength in the upper extremities bilaterally without any deficits.  He has a negative Hoffmann's test bilaterally.  Skin:    General: Skin is warm and dry.     Findings: No erythema or rash.  Neurological:     General: No focal deficit present.     Mental Status: He is alert and oriented to person, place, and time.     Motor: No abnormal muscle tone.     Coordination: Coordination normal.     Gait: Gait normal.  Psychiatric:        Mood and Affect: Mood normal.        Behavior: Behavior normal.        Thought Content: Thought content normal.     Ortho Exam Imaging: No results found.  Past Medical/Family/Surgical/Social History: Medications & Allergies reviewed per EMR, new medications updated. Patient Active Problem List   Diagnosis Date Noted   Hyperglycemia 10/07/2013   Hepatic cirrhosis ([place]) 10/03/2013   Bipolar I disorder, most recent episode (or current) depressed, unspecified 05/21/2013   Hepatitis B 03/21/2013   Esophageal reflux 03/21/2013   Personal history of colonic polyps 03/21/2013   Bipolar disorder, unspecified ([place]) 09/03/2011   Past Medical History:  Diagnosis Date   Bipolar disorder ([place])    Cirrhosis of liver without mention of alcohol    Depression    GERD (gastroesophageal reflux disease)    Headache    Hepatitis B    Hepatitis E. antigen positive by history, status post treatment with Hepsera and baraclude    Hepatitis B carrier ([place])    Personal history of colonic polyps    Adenomas polyp 2008 and 2010,  2015    Weight loss    Pt. has lost 10 lbs since pre-visit, intentionally with diet and walking   Family History  Problem Relation Age of Onset  Heart disease Mother    Diabetes Father    Kidney failure Father    Kidney disease Father    Other Sister        GERD   Colon cancer Neg Hx    Rectal cancer Neg Hx    Stomach cancer Neg Hx    Esophageal cancer Neg Hx    Past Surgical History:  Procedure Laterality Date   APPENDECTOMY     COLONOSCOPY     POLYPECTOMY     UPPER GASTROINTESTINAL ENDOSCOPY     Social History   Occupational History   Occupation: retired    Fish farm manager: NOT EMPLOYED  Tobacco Use   Smoking status: Former Smoker    Types: Cigarettes    Quit date: 05/08/2009    Years since quitting: 9.8   Smokeless tobacco: Never Used  Substance and Sexual Activity   Alcohol use: No    Alcohol/week: 0.0 standard drinks   Drug use: No   Sexual activity: Not Currently

## 2019-03-21 NOTE — Procedures (Signed)
Cervical Epidural Steroid Injection - Interlaminar Approach with Fluoroscopic Guidance  Patient: Angel [name]      Date of Birth: 1955/06/12 MRN: [id number] PCP: [name], PA-C      Visit Date: 03/19/2019   Universal Protocol:    Date/Time: 11/13/205:54 AM  Consent Given By: the patient  Position: PRONE  Additional Comments: Vital signs were monitored before and after the procedure. Patient was prepped and draped in the usual sterile fashion. The correct patient, procedure, and site was verified.   Injection Procedure Details:  Procedure Site One Meds Administered:  Meds ordered this encounter  Medications  . methylPREDNISolone acetate (DEPO-MEDROL) injection 80 mg  . HYDROcodone-acetaminophen (NORCO/VICODIN) 5-325 MG tablet    Sig: Take 1 tablet by mouth 2 (two) times daily for 3 days, THEN 1 tablet daily for 3 days. Then Stop.    Dispense:  10 tablet    Refill:  0     Laterality: Left  Location/Site: C7-T1  Needle size: 20 G  Needle type: Touhy  Needle Placement: Paramedian epidural space  Findings:  -Comments: Excellent flow of contrast into the epidural space.  Procedure Details: Using a paramedian approach from the side mentioned above, the region overlying the inferior lamina was localized under fluoroscopic visualization and the soft tissues overlying this structure were infiltrated with 4 ml. of 1% Lidocaine without Epinephrine. A # 20 gauge, Tuohy needle was inserted into the epidural space using a paramedian approach.  The epidural space was localized using loss of resistance along with lateral and contralateral oblique bi-planar fluoroscopic views.  After negative aspirate for air, blood, and CSF, a 2 ml. volume of Isovue-250 was injected into the epidural space and the flow of contrast was observed. Radiographs were obtained for documentation purposes.   The injectate was administered into the level noted above.  Additional Comments:  The  patient tolerated the procedure well Dressing: 2 x 2 sterile gauze and Band-Aid    Post-procedure details: Patient was observed during the procedure. Post-procedure instructions were reviewed.  Patient left the clinic in stable condition.

## 2019-03-22 ENCOUNTER — Telehealth: Payer: Self-pay | Admitting: Medical

## 2019-03-22 MED ORDER — FAMCICLOVIR 500 MG PO TABS: 500.0000 mg | ORAL_TABLET | Freq: Three times a day (TID) | ORAL | 0 refills | Status: DC

## 2019-03-22 NOTE — Telephone Encounter (Signed)
Rx famvir sent to pt pharmacy. 

## 2019-03-24 ENCOUNTER — Encounter: Payer: Self-pay | Admitting: Medical

## 2019-03-24 ENCOUNTER — Ambulatory Visit (INDEPENDENT_AMBULATORY_CARE_PROVIDER_SITE_OTHER): Payer: BC Managed Care – PPO | Admitting: Medical

## 2019-03-24 ENCOUNTER — Other Ambulatory Visit: Payer: Self-pay

## 2019-03-24 DIAGNOSIS — R519 Headache, unspecified: Secondary | ICD-10-CM | POA: Diagnosis not present

## 2019-03-24 NOTE — Patient Instructions (Signed)
For headache, I did go ahead and place referral to Dr. Jaynee Eagles office.  DC norco.  Continue other meds currently taking. If ha return then may stop naprosyn and try oral toradol as he did well with injections when ha came back/severe. I think this would be worth a try instead of recurrent trips back to office.  Follow up as needed.  Will try to communicate above later but we got cut off again for 2nd time.

## 2019-03-24 NOTE — Progress Notes (Signed)
Subjective:    Patient ID: Angel [name], male    DOB: 1956-02-14, 63 y.o.   MRN: [id number]  HPI   Virtual Visit via Telephone Note  I connected with Angel [name] on 03/24/19 at  3:00 PM EST by telephone and verified that I am speaking with the correct person using two identifiers.  Location: Patient: home Provider: office   I discussed the limitations, risks, security and privacy concerns of performing an evaluation and management service by telephone and the availability of in person appointments. I also discussed with the patient that there may be a patient responsible charge related to this service. The patient expressed understanding and agreed to proceed.      I discussed the assessment and treatment plan with the patient. The patient was provided an opportunity to ask questions and all were answered. The patient agreed with the plan and demonstrated an understanding of the instructions.   The patient was advised to call back or seek an in-person evaluation if the symptoms worsen or if the condition fails to improve as anticipated.  I provided 25  minutes of non-face-to-face time during this encounter.   Angel Pai, PA-C  Pt states that he feels a lot better since he got epidural.  Orthopedist office did not think that he had migraine ha. I am not convinced he has migraine as well though occasional migraine features.  I did review note from specialist and they recommended he see Dr. Jaynee [name] for headaches.   Ortho office who did the injection recommended he stop norco. I had mentioned tapering off of norco. He has one tablet left and will be done.  No ha presently.  Before recent epidural he was having to rely on repeat toradol injections.  I was talking with both Angel [name] and Angel [name] at same time. 3 way phone call as Angel [name] called Angel [name]. After 2 minutes phone call interurpted and mad several attempts to call back Angel [name]. Voice mail boxy was full.   Review of Systems    Constitutional: Negative for chills, fatigue and fever.  Respiratory: Negative for cough, chest tightness, shortness of breath and wheezing.   Cardiovascular: Negative for chest pain and palpitations.  Gastrointestinal: Negative for abdominal pain.  Musculoskeletal: Negative for back pain.  Skin: Negative for rash.  Neurological: Negative for dizziness, seizures, syncope, weakness and headaches.       No headache presently.  Hematological: Negative for adenopathy. Does not bruise/bleed easily.    Past Medical History:  Diagnosis Date   Bipolar disorder ([place])    Cirrhosis of liver without mention of alcohol    Depression    GERD (gastroesophageal reflux disease)    Headache    Hepatitis B    Hepatitis E. 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 has lost 10 lbs since pre-visit, intentionally with diet and walking     Social History   Socioeconomic History   Marital status: Married    Spouse name: Angel [name]   Number of children: 0   Years of education: Not on file   Highest education level: Master's degree (e.g., MA, MS, MEng, MEd, MSW, MBA)  Occupational History   Occupation: retired    Fish farm manager: NOT EMPLOYED  Social Designer, fashion/clothing strain: Not on file  Food insecurity    Worry: Not on file    Inability: Not on file   Transportation needs    Medical: Not on file    Non-medical: Not on file  Tobacco Use   Smoking status: Former Smoker    Types: Cigarettes    Quit date: 05/08/2009    Years since quitting: 9.8   Smokeless tobacco: Never Used  Substance and Sexual Activity   Alcohol use: No    Alcohol/week: 0.0 standard drinks   Drug use: No   Sexual activity: Not Currently  Lifestyle   Physical activity    Days per week: Not on file    Minutes per session: Not on  file   Stress: Not on file  Relationships   Social connections    Talks on phone: Not on file    Gets together: Not on file    Attends religious service: Not on file    Active member of club or organization: Not on file    Attends meetings of clubs or organizations: Not on file    Relationship status: Not on file   Intimate partner violence    Fear of current or ex partner: Not on file    Emotionally abused: Not on file    Physically abused: Not on file    Forced sexual activity: Not on file  Other Topics Concern   Not on file  Social History Narrative   Patient is right-handed. He is married to same sex partner. He drinks 6-9 glasses of tea a day. He gets little exercise.    Past Surgical History:  Procedure Laterality Date   APPENDECTOMY     COLONOSCOPY     POLYPECTOMY     UPPER GASTROINTESTINAL ENDOSCOPY      Family History  Problem Relation Age of Onset   Heart disease Mother    Diabetes Father    Kidney failure Father    Kidney disease Father    Other Sister        GERD   Colon cancer Neg Hx    Rectal cancer Neg Hx    Stomach cancer Neg Hx    Esophageal cancer Neg Hx     Allergies  Allergen Reactions   Aripiprazole Other (See Comments)    Tardive Dyskinesia   Invega [Paliperidone Er] Other (See Comments)    Tardive dyskinsia symptoms   Abilify [Aripiprazole]    Lamictal [Lamotrigine] Rash   Lexapro [Escitalopram Oxalate] Other (See Comments)    Doesn't Work    Current Outpatient Medications on File Prior to Visit  Medication Sig Dispense Refill   bisacodyl (BISACODYL) 5 MG EC tablet TAKE 1 TABLET BY MOUTH EVERY DAY AS NEEDED FOR MODERATE CONSTIPATION 90 tablet 3   clonazePAM (KLONOPIN) 1 MG tablet 2  qhs 60 tablet 5   diphenhydrAMINE (BENADRYL) 25 MG tablet Take 75 mg by mouth at bedtime as needed.     divalproex (DEPAKOTE ER) 250 MG 24 hr tablet 1  qhs  For 2 days  Then  1 bid   For   3  Days  Then 1 qam  And  2  qhs 90  tablet 5   famciclovir (FAMVIR) 500 MG tablet Take 1 tablet (500 mg total) by mouth 3 (three) times daily. 21 tablet 0   HYDROcodone-acetaminophen (NORCO) 5-325 MG tablet 1 tab po bid prn pain 14 tablet 0   HYDROcodone-acetaminophen (NORCO/VICODIN) 5-325 MG tablet Take 1 tablet by mouth 2 (two) times daily for 3  days, THEN 1 tablet daily for 3 days. Then Stop. 10 tablet 0   Inulin-Cholecalciferol (FIBER/D3 ADULT GUMMIES) 2.5-500 GM-UNIT CHEW 1 tab po q day 90 tablet 3   lithium 600 MG capsule 1 qam   2  qhs 90 capsule 5   Melatonin 10 MG SUBL Place 10 mg under the tongue at bedtime.     methocarbamol (ROBAXIN) 750 MG tablet Take 1 tablet (750 mg total) by mouth 4 (four) times daily. 120 tablet 1   nadolol (CORGARD) 40 MG tablet TAKE 1 TABLET BY MOUTH EVERY DAY 30 tablet 0   naproxen (NAPROSYN) 500 MG tablet Take 1 tablet (500 mg total) by mouth 2 (two) times daily. 20 tablet 0   traZODone (DESYREL) 50 MG tablet TAKE 3 TABLETS DAILY AT BEDTIME AND MAY REPEAT WITH ONE MORE TABLET 100 tablet 12   venlafaxine XR (EFFEXOR-XR) 150 MG 24 hr capsule Take 1 capsule (150 mg total) by mouth every morning. 90 capsule 0   No current facility-administered medications on file prior to visit.     There were no vitals taken for this visit.      Objective:   Physical Exam        Assessment & Plan:  For headache, I did go ahead and place referral to Dr. Jaynee [name] office.  DC norco.  Continue other meds currently taking. If ha return then may stop naprosyn and try oral toradol as he did well with injections when ha came back/severe. I think this would be worth a try instead of recurrent trips back to office.  Follow up as needed.  Will try to communicate above later but we got cut off again for 2nd time.  25 minutes spent with pt. 50% of time spent counseling pt on plan going forward. Though we were cut off and will try to call pt back later after hours.

## 2019-03-25 ENCOUNTER — Telehealth: Payer: Self-pay | Admitting: Neurology

## 2019-03-25 NOTE — Telephone Encounter (Signed)
Dr. Harvie Heck, Absolutely we can see! Thank you for the referral. Treating headaches is my favorite thing to do, my specialty is headaches. Would it be ok if he came in in December or January however? More than happy to see him but whenever I see "urgent" on a consult we assume it means within a few days. So I was just clarifying how soon he was needing to be seen since it seems he has had this a while and we have a lot of urgent referrals. Let me know and we are more than happy to see him, again just wanted to clarify the timeframe. thanks

## 2019-03-25 NOTE — Telephone Encounter (Signed)
Dr. Jaynee Eagles,  Relatively soon within 1-2 weeks would be ideal. He is doing ok presently but concern he is going to eventually get back into cycle of severe daily headaches. I appreciate your help and quick response.  [name], PA-C

## 2019-03-25 NOTE — Telephone Encounter (Signed)
I have reached out to Dr. Mitchel Honour to clarify because patient has had this problem for a long time and he saw [facility] neurology last year and already had brain imaging, not sure if it is urgent but I will let you know thanks.

## 2019-03-25 NOTE — Telephone Encounter (Signed)
Dr. Mitchel Honour, We received this urgent consult today for headache.  But it appears that this patient has had headaches for quite some time and been evaluated by multiple doctors including Dr. Tomi Likens at [facility] neurology and already had imaging etc. Would you let me know what the urgency is please?  We are trying to get to our new patients as soon as possible but being the end of the year we are all pretty full. If this patient really is urgent we can get him in, but seeing this appears to be a chronic problem is December or January ok? Also, do you have any concern for sleep apnea at all( which can cause headaches)?  Thanks, [place] Dom

## 2019-03-25 NOTE — Telephone Encounter (Signed)
Patient has an urgent referral in epic to Dr. Jaynee Eagles, please review and let me know if pt needs apt sooner than our next apt in January.

## 2019-03-25 NOTE — Telephone Encounter (Signed)
Hi Toni, good to hear from you.  Mr. Kos is not my patient.  He was seen by [name], PA-C and referred to your group for evaluation.  I wish I could help you more.  Hope you are doing well.  Take care and be safe. [place]

## 2019-03-25 NOTE — Telephone Encounter (Signed)
Dr. Jaynee Eagles,  This is Angel [name] . Dr. Rosezella Rumpf and my last names are  similar. Notes of his are sent to me sometimes.  Regarding this pt. He got cervical epidural injection recently and the orthopedist told pt he should see you and gave him your name. I saw the note and thought not bad idea at the time as his HA are very challenging.   Per pt his last epidural did help briefly in the past but then ha became recurrent/daily. He had gone to ED recently various times and they had given him norco and toradol. I discouraged norco but he did continue short time as awaited repeat epidural.  Ortho seemed to indicate to patient origin of ha not coming from neck. However day after most recent injections he had significant improvement at least for one day.  I understand if you have long wait time.  I recently refreshed my memory/reviewed neurologist note. He seemed convinced neck source of ha.   Since ortho recommended you thought second opinion would not hurt. There appears to be neck component to ha but wanted opinion of other contributing factor  Let me know what you think. Can you see?  Thanks, Angel Pai, PA-C

## 2019-03-25 NOTE — Telephone Encounter (Signed)
Angel [name], He is doing ok right now. Let's put him in the first or second week of December. thanks

## 2019-03-25 NOTE — Telephone Encounter (Signed)
Absolutely we can see! Thank you for the referral. Treating headaches is my favorite thing to do, my specialty is headaches. Would it be ok if he came in in December or January however? More than happy to see him but whenever I see "urgent" on a consult we assume it means within a few days. So I was just clarifying how soon he was needing to be seen since it seems he has had this a while and we have a lot of urgent referrals. Let me know and we are more than happy to see him, again just wanted to clarify the timeframe. Also, any suspicion for sleep apnea causing the headaches? If so I always recommend a sleep referral first, untreated sleep apnea is classic for causing intractable headaches.  Thanks! [place] Dom

## 2019-03-26 ENCOUNTER — Ambulatory Visit: Payer: BC Managed Care – PPO | Admitting: Medical

## 2019-03-28 ENCOUNTER — Encounter: Payer: Self-pay | Admitting: Medical

## 2019-03-30 ENCOUNTER — Telehealth: Payer: Self-pay | Admitting: Medical

## 2019-03-30 MED ORDER — KETOROLAC TROMETHAMINE 10 MG PO TABS: 10.0000 mg | ORAL_TABLET | Freq: Three times a day (TID) | ORAL | 0 refills | Status: DC | PRN

## 2019-03-30 NOTE — Telephone Encounter (Signed)
Rx toradol sent to pt pharmacy.

## 2019-03-31 NOTE — Telephone Encounter (Signed)
Left message on pt's partners phone for pt to reach messages from [place] and give [place] a call if he has any questions.

## 2019-04-01 ENCOUNTER — Telehealth: Payer: Self-pay | Admitting: *Deleted

## 2019-04-01 NOTE — Telephone Encounter (Signed)
Contact Type Call Who Is Calling Pharmacy Call Type Pharmacy Send to RN Chief Complaint Paging or Request for Consult Reason for Call Request to clarify medication order Initial Comment Caller states calling from [facility] about a mutual patient. She needs to clarify. Patient is prescribed tablets but if he hasn't received injectable in office she can't fill the tablets. Additional Comment Pharmacy Name [facility] Pharmacist Name [place] Number [phone]

## 2019-04-01 NOTE — Telephone Encounter (Signed)
Not sure if this was handled.  He has not called back or messaged.

## 2019-04-01 NOTE — Telephone Encounter (Signed)
Can you call pharmacy for me. I tried to rx toradol tablets. Find out what issue is. Rarely rx toradol tablets. Are they not available or insurance issue?

## 2019-04-01 NOTE — Telephone Encounter (Signed)
They had not filled yet, but advised pharmacist Autumn that he has had toradol injectable in past.  She went ahead to fill rx.

## 2019-04-01 NOTE — Telephone Encounter (Signed)
Error

## 2019-04-02 ENCOUNTER — Other Ambulatory Visit (HOSPITAL_COMMUNITY): Payer: Self-pay | Admitting: *Deleted

## 2019-04-07 ENCOUNTER — Other Ambulatory Visit (HOSPITAL_COMMUNITY): Payer: Self-pay

## 2019-04-07 DIAGNOSIS — F330 Major depressive disorder, recurrent, mild: Secondary | ICD-10-CM

## 2019-04-07 MED ORDER — CLONAZEPAM 1 MG PO TABS: ORAL_TABLET | ORAL | 5 refills | Status: DC

## 2019-04-08 ENCOUNTER — Encounter: Payer: Self-pay | Admitting: Medical

## 2019-04-09 ENCOUNTER — Encounter: Payer: Self-pay | Admitting: Medical

## 2019-04-09 ENCOUNTER — Telehealth: Payer: Self-pay | Admitting: Medical

## 2019-04-09 MED ORDER — FAMCICLOVIR 500 MG PO TABS: 500.0000 mg | ORAL_TABLET | Freq: Three times a day (TID) | ORAL | 3 refills | Status: DC

## 2019-04-09 NOTE — Telephone Encounter (Signed)
First time got chance to answer him. I think his best option presently is to see Dr. Jaynee Eagles. Did they call him back. They can give opinion on headaches. Don't have quick solution for other location/office to give injection? Although sometimes neurologist might give injection depending on type of headache.

## 2019-04-09 NOTE — Telephone Encounter (Signed)
Rx favmir with refills sent to pt pharmacy.

## 2019-04-09 NOTE — Telephone Encounter (Signed)
Pt stated he is having more pain than yesterday regarding his neck. He would like to know if Angel [name] could send him somewhere today to have an injection to help with the pain. Please advise.

## 2019-04-10 ENCOUNTER — Ambulatory Visit (INDEPENDENT_AMBULATORY_CARE_PROVIDER_SITE_OTHER): Payer: BC Managed Care – PPO | Admitting: Neurology

## 2019-04-10 ENCOUNTER — Encounter: Payer: Self-pay | Admitting: Neurology

## 2019-04-10 ENCOUNTER — Other Ambulatory Visit: Payer: Self-pay

## 2019-04-10 VITALS — BP 138/83 | HR 59 | Temp 97.5°F | Ht 70.0 in | Wt 240.0 lb

## 2019-04-10 DIAGNOSIS — M5481 Occipital neuralgia: Secondary | ICD-10-CM | POA: Diagnosis not present

## 2019-04-10 MED ORDER — AJOVY 225 MG/1.5ML ~~LOC~~ SOAJ: 725.0000 mg | SUBCUTANEOUS | 0 refills | Status: DC

## 2019-04-10 NOTE — Progress Notes (Signed)
[facility]    Provider:  Dr Jaynee Eagles Requesting Provider: Elise Benne Primary Care Provider:  [name], PA-C  CC:  Headache  HPI:  Angel [name] is a 63 y.o. male here as requested by [name], PA-C for headache. Pain is in the back of the head on the left side and resolves with cervical epidural.Headaches start and escalate very quickly can be 10/10 and he goes to the emergency room, has been there multiple times in a week. Starts on the left in the back of the head, not pounding or throbbing, burning and pressure and then it spreads to the top of the head and worsens and then behind the eye. Can last days continuously. No pound or throb. He does not have sensitivity to light. No nausea. No autonomic symptoms. And he loses his balance. Very agitated when it happens, gets angry, talks very fast and pounds. Started about a year ago. The shots significantly improve the headache for a year. Epidural steroid injections make the headache go away for a year. He had another shot in November and it helped but he had another headache. Reglan will stop them for a few days. No snoring. No other focal neurologic deficits, associated symptoms, inciting events or modifiable factors.   Reviewed notes, labs and imaging from outside physicians, which showed:  Personally reviewed : 1. 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 This is felt to be incidental in nature, and of doubtful clinical significance. 2. Otherwise normal brain MRI.  No acute intracranial abnormality.  Review of Systems: Patient complains of symptoms per HPI as well as the following symptoms: headache. Pertinent negatives and positives per HPI. All others negative.   Social History   Socioeconomic History  . Marital status: Married    Spouse name: Angel [name]  . Number of children: 0  . Years of education: Not on file  . Highest education level:  Master's degree (e.g., MA, MS, MEng, MEd, MSW, MBA)  Occupational History  . 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 Financial resource strain: Not on file  . Food insecurity    Worry: Not on file    Inability: Not on file  . Transportation needs    Medical: Not on file    Non-medical: Not on file  Tobacco Use  . Smoking status: Former Smoker    Types: Cigarettes    Quit date: 05/08/2009    Years since quitting: 9.9  . Smokeless tobacco: Never Used  Substance and Sexual Activity  . Alcohol use: No    Alcohol/week: 0.0 standard drinks  . Drug use: No  . Sexual activity: Not Currently  Lifestyle  . Physical activity    Days per week: Not on file    Minutes per session: Not on file  . Stress: Not on file  Relationships  . Social Herbalist on phone: Not on file    Gets together: Not on file    Attends religious service: Not on file    Active member of club or organization: Not on file    Attends meetings of clubs or organizations: Not on file    Relationship status: Not on file  . Intimate partner violence    Fear of current or ex partner: Not on file    Emotionally abused: Not on file    Physically abused: Not on file    Forced sexual activity:  Not on file  Other Topics Concern  . Not on file  Social History Narrative   Patient is right-handed. He is married to same sex partner. He gets little exercise.   Caffeine: 4 glasses of tea max/day    Family History  Problem Relation Age of Onset  . Heart disease Mother   . Diabetes Father   . Kidney failure Father   . Kidney disease Father   . Other Sister        GERD  . Colon cancer Neg Hx   . Rectal cancer Neg Hx   . Stomach cancer Neg Hx   . Esophageal cancer Neg Hx   . Headache Neg Hx   . Migraines Neg Hx     Past Medical History:  Diagnosis Date  . Bipolar disorder ([place])   . 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 Depression   . GERD (gastroesophageal reflux  disease)   . Headache   . Hepatitis B    Hepatitis E. antigen positive by history, status post treatment with Hepsera and baraclude   . Hepatitis B carrier ([place])   . Personal history of colonic polyps    Adenomas polyp 2008 and 2010, 2015   . Weight loss    Pt. has lost 10 lbs since pre-visit, intentionally with diet and walking    Patient Active Problem List   Diagnosis Date Noted  . Occipital neuralgia of left side 04/10/2019  . Hyperglycemia 10/07/2013  . Hepatic cirrhosis ([place]) 10/03/2013  . Bipolar I disorder, most recent episode (or current) depressed, unspecified 05/21/2013  . Hepatitis B 03/21/2013  . Esophageal reflux 03/21/2013  . Personal history of colonic polyps 03/21/2013  . Bipolar disorder, unspecified ([place]) 09/03/2011    Past Surgical History:  Procedure Laterality Date  . APPENDECTOMY    . COLONOSCOPY    . POLYPECTOMY    . UPPER GASTROINTESTINAL ENDOSCOPY      Current Outpatient Medications  Medication Sig Dispense Refill  . bisacodyl (BISACODYL) 5 MG EC tablet TAKE 1 TABLET BY MOUTH EVERY DAY AS NEEDED FOR MODERATE CONSTIPATION 90 tablet 3  . clonazePAM (KLONOPIN) 1 MG tablet 2  qhs 60 tablet 5  . diphenhydrAMINE (BENADRYL) 25 MG tablet Take 75 mg by mouth at bedtime as needed.    . divalproex (DEPAKOTE ER) 250 MG 24 hr tablet 1  qhs  For 2 days  Then  1 bid   For   3  Days  Then 1 qam  And  2  qhs 90 tablet 5  . famciclovir (FAMVIR) 500 MG tablet Take 1 tablet (500 mg total) by mouth 3 (three) times daily. 21 tablet 3  . Inulin-Cholecalciferol (FIBER/D3 ADULT GUMMIES) 2.5-500 GM-UNIT CHEW 1 tab po q day 90 tablet 3  . ketorolac (TORADOL) 10 MG tablet Take 1 tablet (10 mg total) by mouth every 8 (eight) hours as needed for severe pain. 1 tab po q 8 hours prn headache 12 tablet 0  . lithium 600 MG capsule 1 qam   2  qhs 90 capsule 5  . Melatonin 10 MG SUBL Place 10 mg under the tongue at bedtime.    . methocarbamol (ROBAXIN) 750 MG tablet Take 1 tablet (750  mg total) by mouth 4 (four) times daily. 120 tablet 1  . nadolol (CORGARD) 40 MG tablet TAKE 1 TABLET BY MOUTH EVERY DAY 30 tablet 0  . traZODone (DESYREL) 50 MG tablet TAKE 3 TABLETS DAILY AT BEDTIME AND MAY  REPEAT WITH ONE MORE TABLET 100 tablet 12  . venlafaxine XR (EFFEXOR-XR) 150 MG 24 hr capsule Take 1 capsule (150 mg total) by mouth every morning. 90 capsule 0  . Fremanezumab-vfrm (AJOVY) 225 MG/1.5ML SOAJ Inject 725 mg into the skin every 30 (thirty) days. 3 pen 0   No current facility-administered medications for this visit.     Allergies as of 04/10/2019 - Review Complete 04/10/2019  Allergen Reaction Noted  . Aripiprazole Other (See Comments) 03/15/2011  . Invega [paliperidone er] Other (See Comments) 05/03/2016  . Abilify [aripiprazole]  03/21/2013  . Lamictal [lamotrigine] Rash 03/15/2011  . Lexapro [escitalopram oxalate] Other (See Comments) 03/15/2011    Vitals: BP 138/83 (BP Location: Right Arm, Patient Position: Sitting)   Pulse (!) 59   Temp (!) 97.5 F (36.4 C) Comment: visitor 97.8  Ht 5\' 10"  (1.778 m)   Wt 240 lb (108.9 kg)   BMI 34.44 kg/m  Last Weight:  Wt Readings from Last 1 Encounters:  04/10/19 240 lb (108.9 kg)   Last Height:   Ht Readings from Last 1 Encounters:  04/10/19 5\' 10"  (1.778 m)     Physical exam: Exam: Gen: NAD, conversant, well nourised, obese, well groomed                     CV: RRR, no MRG. No Carotid Bruits. No peripheral edema, warm, nontender Eyes: Conjunctivae clear without exudates or hemorrhage  Neuro: Detailed Neurologic Exam  Speech:    Speech is normal; fluent and spontaneous with normal comprehension.  Cognition:    The patient is oriented to person, place, and time;     recent and remote memory intact;     language fluent;     normal attention, concentration,     fund of knowledge Cranial Nerves:    The pupils are equal, round, and reactive to light. The fundi are normal and spontaneous venous pulsations are  present. Visual fields are full to finger confrontation. Extraocular movements are intact. Trigeminal sensation is intact and the muscles of mastication are normal. The face is symmetric. The palate elevates in the midline. Hearing intact. Voice is normal. Shoulder shrug is normal. The tongue has normal motion without fasciculations.   Coordination:    Normal finger to nose and heel to shin. Normal rapid alternating movements.   Gait:    Heel-toe and tandem gait are normal.   Motor Observation:    No asymmetry, no atrophy, and no involuntary movements noted. Tone:    Normal muscle tone.    Posture:    Posture is normal. normal erect    Strength:    Strength is V/V in the upper and lower limbs.      Sensation: intact to LT     Reflex Exam:  DTR's:    Deep tendon reflexes in the upper and lower extremities are normal bilaterally.   Toes:    The toes are downgoing bilaterally.   Clonus:    Clonus is absent.    Assessment/Plan: This is a really lovely patient here with his partner.  Patient has pain in the left posterior occipital area radiating up to the top of his head.  No migrainous features.  No autonomic features.  It is aggravated by turning his head.  It really does sound like left-sided occipital neuralgia to me. Cervical epidural injections help significantly, even though there is no apparent compression at C2-C3 degenerative changes in the neck can cause occipital neuralgia.  He  also has very poor posture, this could be stretch injury due to patient flexing neck when falling asleep in chair or when using his computer or phone due to neck flexion. Also very tight left trap, may be a component of muscle tightness.  We discussed everything.    - I will inject him with one-time 3 month protocol of Ajovy however I really do not feel that these are migrainous.  In the meantime I will also send him to physical therapy for his occipital neuralgia.  Sig[facility] Sos for physical therapy,  stretching, dry needling: left cervical myofascial pain and occipital neuralgia.  I sent an email to Dr. Ernestina Patches, [name] is quite helpful. Although no direct nerve compression on MRI but still may consider medial branch blocks. Also RFA or other pain procedures on left occipital nerve as clinically warranted by Dr. Ernestina Patches.  Follow clinically. Patient to return to clinic after PT if necessary. Changing his posture along with PT and ESIs may control left-sided occipital nerve irritation.   Orders Placed This Encounter  Procedures  . Ambulatory referral to Physical Therapy   Meds ordered this encounter  Medications  . Fremanezumab-vfrm (AJOVY) 225 MG/1.5ML SOAJ    Sig: Inject 725 mg into the skin every 30 (thirty) days.    Dispense:  3 pen    Refill:  0    Cc: Saguier, Percell Miller, PA-C,  Dr. Laurence Spates  Sarina Ill, MD  [name] [address] [place], [place] [address]  Phone [phone] Fax [phone]

## 2019-04-10 NOTE — Patient Instructions (Signed)
Occipital Neuralgia  Occipital neuralgia is a type of headache that causes brief episodes of very bad pain in the back of your head. Pain from occipital neuralgia may spread (radiate) to other parts of your head. These headaches may be caused by irritation of the nerves that leave your spinal cord high up in your neck, just below the base of your skull (occipital nerves). Your occipital nerves transmit sensations from the back of your head, the top of your head, and the areas behind your ears. What are the causes? This condition can occur without any known cause (primary headache syndrome). In other cases, this condition is caused by pressure on or irritation of one of the two occipital nerves. Pressure and irritation may be due to:  Muscle spasm in the neck.  Neck injury.  Wear and tear of the vertebrae in the neck (osteoarthritis).  Disease of the disks that separate the vertebrae.  Swollen blood vessels that put pressure on the occipital nerves.  Infections.  Tumors.  Diabetes. What are the signs or symptoms? This condition causes brief burning, stabbing, electric, shocking, or shooting pain which can radiate to the top of the head. It can happen on one side or both sides of the head. It can also cause:  Pain behind the eye.  Pain triggered by neck movement or hair brushing.  Scalp tenderness.  Aching in the back of the head between episodes of very bad pain.  Pain gets worse with exposure to bright lights. How is this diagnosed? There is no test that diagnoses this condition. Your health care provider may diagnose this condition based on a physical exam and your symptoms. Other tests may be done, such as:  Imaging studies of the brain and neck (cervical spine), such as an MRI or CT scan. These look for causes of pinched nerves.  Applying pressure to the nerves in the neck to try to re-create the pain.  Injection of numbing medicine into the occipital nerve areas to see if  pain goes away (diagnostic nerve block). How is this treated? Treatment for this condition may begin with simple measures, such as:  Rest.  Massage.  Applying heat or cold on the area.  Over-the-counter pain relievers. If these measures do not work, you may need other treatments, including:  Medicines, such as: ? Prescription-strength anti-inflammatory medicines. ? Muscle relaxants. ? Anti-seizure medicines, which can relieve pain. ? Antidepressants, which can relieve pain. ? Injected medicines, such as medicines that numb the area (local anesthetic) and steroids.  Pulsed radiofrequency ablation. This is when wires are implanted to deliver electrical impulses that block pain signals from the occipital nerve.  Surgery to relieve nerve pressure.  Physical therapy. Follow these instructions at home: Pain management      Avoid any activities that cause pain.  Rest when you have an attack of pain.  Try gentle massage to relieve pain.  Try a different pillow or sleeping position.  If directed, apply heat to the affected area as told by your health care provider. Use the heat source that your health care provider recommends, such as a moist heat pack or a heating pad. ? Place a towel between your skin and the heat source. ? Leave the heat on for 20-30 minutes. ? Remove the heat if your skin turns bright red. This is especially important if you are unable to feel pain, heat, or cold. You may have a greater risk of getting burned.  If directed, apply ice to the   back of the head and neck area as told by your health care provider. ? Put ice in a plastic bag. ? Place a towel between your skin and the bag. ? Leave the ice on for 20 minutes, 2-3 times per day. General instructions  Take over-the-counter and prescription medicines only as told by your health care provider.  Avoid things that make your symptoms worse, such as bright lights.  Try to stay active. Get regular  exercise that does not cause pain. Ask your health care provider to suggest safe exercises for you.  Work with a physical therapist to learn stretching exercises you can do at home.  Practice good posture.  Keep all follow-up visits as told by your health care provider. This is important. Contact a health care provider if:  Your medicine is not working.  You have new or worsening symptoms. Get help right away if:  You have very bad head pain that does not go away.  You have a sudden change in vision, balance, or speech. Summary  Occipital neuralgia is a type of headache that causes brief episodes of very bad pain in the back of your head.  Pain from occipital neuralgia may spread (radiate) to other parts of your head.  Treatment for this condition includes rest, massage, and medicines. This information is not intended to replace advice given to you by your health care provider. Make sure you discuss any questions you have with your health care provider. Document Released: 04/18/2001 Document Revised: 04/10/2017 Document Reviewed: 06/29/2016 Elsevier Patient Education  2020 [place] injection What is this medicine? FREMANEZUMAB (fre ma NEZ ue mab) is used to prevent migraine headaches. This medicine may be used for other purposes; ask your health care provider or pharmacist if you have questions. COMMON BRAND NAME(S): AJOVY What should I tell my health care provider before I take this medicine? They need to know if you have any of these conditions:  an unusual or allergic reaction to fremanezumab, other medicines, foods, dyes, or preservatives  pregnant or trying to get pregnant  breast-feeding How should I use this medicine? This medicine is for injection under the skin. You will be taught how to prepare and give this medicine. Use exactly as directed. Take your medicine at regular intervals. Do not take your medicine more often than directed. It is  important that you put your used needles and syringes in a special sharps container. Do not put them in a trash can. If you do not have a sharps container, call your pharmacist or healthcare provider to get one. Talk to your pediatrician regarding the use of this medicine in children. Special care may be needed. Overdosage: If you think you have taken too much of this medicine contact a poison control center or emergency room at once. NOTE: This medicine is only for you. Do not share this medicine with others. What if I miss a dose? If you miss a dose, take it as soon as you can. If it is almost time for your next dose, take only that dose. Do not take double or extra doses. What may interact with this medicine? Interactions are not expected. This list may not describe all possible interactions. Give your health care provider a list of all the medicines, herbs, non-prescription drugs, or dietary supplements you use. Also tell them if you smoke, drink alcohol, or use illegal drugs. Some items may interact with your medicine. What should I watch for while using this medicine?  Tell your doctor or healthcare professional if your symptoms do not start to get better or if they get worse. What side effects may I notice from receiving this medicine? Side effects that you should report to your doctor or health care professional as soon as possible:  allergic reactions like skin rash, itching or hives, swelling of the face, lips, or tongue Side effects that usually do not require medical attention (report these to your doctor or health care professional if they continue or are bothersome):  pain, redness, or irritation at site where injected This list may not describe all possible side effects. Call your doctor for medical advice about side effects. You may report side effects to FDA at 1-800-FDA-1088. Where should I keep my medicine? Keep out of the reach of children. You will be instructed on how to  store this medicine. Throw away any unused medicine after the expiration date on the label. NOTE: This sheet is a summary. It may not cover all possible information. If you have questions about this medicine, talk to your doctor, pharmacist, or health care provider.  2020 Elsevier/Gold Standard (2017-01-22 17:22:56)

## 2019-04-15 ENCOUNTER — Telehealth: Payer: Self-pay | Admitting: Medical

## 2019-04-15 ENCOUNTER — Encounter: Admit: 2019-04-15 | Discharge: 2019-04-15 | Payer: MEDICARE

## 2019-04-15 DIAGNOSIS — R161 Splenomegaly, not elsewhere classified: Secondary | ICD-10-CM | POA: Diagnosis not present

## 2019-04-15 DIAGNOSIS — K766 Portal hypertension: Secondary | ICD-10-CM | POA: Diagnosis not present

## 2019-04-15 DIAGNOSIS — B181 Chronic viral hepatitis B without delta-agent: Secondary | ICD-10-CM | POA: Diagnosis not present

## 2019-04-15 DIAGNOSIS — K7469 Other cirrhosis of liver: Secondary | ICD-10-CM | POA: Diagnosis not present

## 2019-04-15 MED ORDER — KETOROLAC TROMETHAMINE 10 MG PO TABS: 10.0000 mg | ORAL_TABLET | Freq: Three times a day (TID) | ORAL | 0 refills | Status: DC | PRN

## 2019-04-15 NOTE — Telephone Encounter (Signed)
Rx toradol sent to pt pharmacy.

## 2019-04-16 ENCOUNTER — Other Ambulatory Visit: Payer: Self-pay

## 2019-04-16 ENCOUNTER — Ambulatory Visit (INDEPENDENT_AMBULATORY_CARE_PROVIDER_SITE_OTHER): Payer: BC Managed Care – PPO | Admitting: Psychiatry

## 2019-04-16 DIAGNOSIS — F331 Major depressive disorder, recurrent, moderate: Secondary | ICD-10-CM

## 2019-04-16 DIAGNOSIS — F3130 Bipolar disorder, current episode depressed, mild or moderate severity, unspecified: Secondary | ICD-10-CM | POA: Diagnosis not present

## 2019-04-16 DIAGNOSIS — F330 Major depressive disorder, recurrent, mild: Secondary | ICD-10-CM

## 2019-04-16 MED ORDER — CLONAZEPAM 1 MG PO TABS: ORAL_TABLET | ORAL | 5 refills | Status: DC

## 2019-04-16 MED ORDER — VENLAFAXINE HCL ER 150 MG PO CP24: 150.0000 mg | ORAL_CAPSULE | Freq: Every morning | ORAL | 1 refills | Status: DC

## 2019-04-16 MED ORDER — TRAZODONE HCL 50 MG PO TABS: ORAL_TABLET | ORAL | 12 refills | Status: DC

## 2019-04-16 MED ORDER — DIVALPROEX SODIUM ER 250 MG PO TB24: ORAL_TABLET | ORAL | 5 refills | Status: DC

## 2019-04-16 MED ORDER — VENLAFAXINE HCL ER 150 MG PO CP24: 150.0000 mg | ORAL_CAPSULE | Freq: Every morning | ORAL | 0 refills | Status: DC

## 2019-04-16 MED ORDER — LITHIUM CARBONATE 600 MG PO CAPS: ORAL_CAPSULE | ORAL | 5 refills | Status: DC

## 2019-04-16 NOTE — Telephone Encounter (Signed)
Opened to review 

## 2019-04-16 NOTE — Progress Notes (Signed)
Patient ID: Angel [name], male   DOB: 12/16/1955, 63 y.o.   MRN: [id number] [facility] MD Progress Note  04/16/2019 3:32 PM Angel [name]  MRN:  [id number] Subjective:  Feeling great Principal Problem: Bipolar disorder Past Medical History:  Today the patient is doing fairly well.  He has not been seen in 4 months.  Today per phone while the patient was seen in person Angel [name] his partner joined [place] by speaker phone.  The patient himself says he is doing fairly well.  He admits to some degree of irritability according to Angel [name] that is true.  The patient get upset easily and quickly.  He is never violent.  Both of them agree he is not overtly manic as he has been in the past.  There is no clear evidence that he is actually depressed or sad.  His sleep that was distinctly abnormal.  Over the last month or 2 he sleeps a very short amount only 3 or 4 hours and has more than normal energy level.  It is the only feature of possible mania.  He denies any mood problems but his partner says he is different.  His speech is at a normal speed, his activity level is relatively normal and he is not grandiose.  He is doing nothing dangerous or risky.  The only thing that is distinctly abnormal is a mild degree of irritability together with a lack of a need for sleep.  It should be noted that he is having more pain than usual.  It should also be noted that he is having a lot of constipation for reasons that are not clear.  The patient denies the use of drugs or alcohol and is never been psychotic.  It is noted that his last lithium level was 0.8 and this was done many months ago.  Also his Depakote was distinctly low at 32.  Healthwise the patient is doing fairly well other than some constipation and pain.  He apparently has made a complete liver recovery.  Financially he is very stable.  The patient enjoys his neighborhood in his home.  Past Medical History:  Diagnosis Date  . Bipolar disorder ([place])   . Cirrhosis of liver without  mention of alcohol   . Depression   . GERD (gastroesophageal reflux disease)   . Headache   . Hepatitis B    Hepatitis E. antigen positive by history, status post treatment with Hepsera and baraclude   . 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 Personal history of colonic polyps    Adenomas polyp 2008 and 2010, 2015   . Weight loss    Pt. has lost 10 lbs since pre-visit, intentionally with diet and walking    Past Surgical History:  Procedure Laterality Date  . APPENDECTOMY    . COLONOSCOPY    . POLYPECTOMY    . UPPER GASTROINTESTINAL ENDOSCOPY     Family History:  Family History  Problem Relation Age of Onset  . Heart disease Mother   . Diabetes Father   . Kidney failure Father   . Kidney disease Father   . Other Sister        GERD  . Colon cancer Neg Hx   . Rectal cancer Neg Hx   . Stomach cancer Neg Hx   . Esophageal cancer Neg Hx   . Headache Neg Hx   . Migraines Neg Hx    Family Psychiatric  History:  Social History:  Social History   Substance and Sexual  Activity  Alcohol Use No  . Alcohol/week: 0.0 standard drinks     Social History   Substance and Sexual Activity  Drug Use No    Social History   Socioeconomic History  . Marital status: Married    Spouse name: Angel [name]  . Number of children: 0  . Years of education: Not on file  . Highest education level: Master's degree (e.g., MA, MS, MEng, MEd, MSW, MBA)  Occupational History  . 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 Financial resource strain: Not on file  . Food insecurity    Worry: Not on file    Inability: Not on file  . Transportation needs    Medical: Not on file    Non-medical: Not on file  Tobacco Use  . Smoking status: Former Smoker    Types: Cigarettes    Quit date: 05/08/2009    Years since quitting: 9.9  . Smokeless tobacco: Never Used  Substance and Sexual Activity  . Alcohol use: No    Alcohol/week: 0.0 standard drinks  . Drug use: No  . Sexual activity:  Not Currently  Lifestyle  . Physical activity    Days per week: Not on file    Minutes per session: Not on file  . Stress: Not on file  Relationships  . Social Herbalist on phone: Not on file    Gets together: Not on file    Attends religious service: Not on file    Active member of club or organization: Not on file    Attends meetings of clubs or organizations: Not on file    Relationship status: Not on file  Other Topics Concern  . Not on file  Social History Narrative   Patient is right-handed. He is married to same sex partner. He gets little exercise.   Caffeine: 4 glasses of tea max/day   Additional Social History:                         Sleep: Good  Appetite:  Good  Current Medications:   Lab Results:  No results found for this or any previous visit (from the past 48 hour(s)).  Physical Findings: AIMS:  , ,  ,  ,    CIWA:    COWS:     Musculoskeletal: Strength & Muscle Tone: within normal limits Gait & Station: normal Patient leans: N/A  Psychiatric Specialty Exam: ROSE Angel [name] is resistant Patient ID: Angel [name], male   DOB: [birth year], 63 y.o.   MRN: [id number] [facility] MD Progress Note  04/16/2019 3:32 PM Angel [name]  MRN:  [id number] Subjective:  Feeling great Principal Problem: Bipolar disorder, most recent episode depression Today the patient is seen with his partner care. Angel [name] patient is doing well. His mood is stable. He shows no signs of mania. His sleep is a little erratic but is not really that big of a problem. He's eating well has good energy enjoying life. He uses no alcohol or drugs. His hepatitis B cirrhosis is stable. Medically he is feeling well. Initially he stable. He's got good relationship with his partner with others around him. There is no evidence of alcohol or drug use in this individual. He takes his medicines just as prescribed. Past Medical History:  Past Medical History:  Diagnosis Date  . Bipolar disorder  ([place])   . 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 Depression   .  GERD (gastroesophageal reflux disease)   . Headache   . Hepatitis B    Hepatitis E. antigen positive by history, status post treatment with Hepsera and baraclude   . 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 Personal history of colonic polyps    Adenomas polyp 2008 and 2010, 2015   . Weight loss    Pt. has lost 10 lbs since pre-visit, intentionally with diet and walking    Past Surgical History:  Procedure Laterality Date  . APPENDECTOMY    . COLONOSCOPY    . POLYPECTOMY    . UPPER GASTROINTESTINAL ENDOSCOPY     Family History:  Family History  Problem Relation Age of Onset  . Heart disease Mother   . Diabetes Father   . Kidney failure Father   . Kidney disease Father   . Other Sister        GERD  . Colon cancer Neg Hx   . Rectal cancer Neg Hx   . Stomach cancer Neg Hx   . Esophageal cancer Neg Hx   . Headache Neg Hx   . Migraines Neg Hx    Family Psychiatric  History:  Social History:  Social History   Substance and Sexual Activity  Alcohol Use No  . Alcohol/week: 0.0 standard drinks     Social History   Substance and Sexual Activity  Drug Use No    Social History   Socioeconomic History  . Marital status: Married    Spouse name: Angel [name]  . Number of children: 0  . Years of education: Not on file  . Highest education level: Master's degree (e.g., MA, MS, MEng, MEd, MSW, MBA)  Occupational History  . 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 Financial resource strain: Not on file  . Food insecurity    Worry: Not on file    Inability: Not on file  . Transportation needs    Medical: Not on file    Non-medical: Not on file  Tobacco Use  . Smoking status: Former Smoker    Types: Cigarettes    Quit date: 05/08/2009    Years since quitting: 9.9  . Smokeless tobacco: Never Used  Substance and Sexual Activity  . Alcohol use: No    Alcohol/week: 0.0 standard drinks   . Drug use: No  . Sexual activity: Not Currently  Lifestyle  . Physical activity    Days per week: Not on file    Minutes per session: Not on file  . Stress: Not on file  Relationships  . Social Herbalist on phone: Not on file    Gets together: Not on file    Attends religious service: Not on file    Active member of club or organization: Not on file    Attends meetings of clubs or organizations: Not on file    Relationship status: Not on file  Other Topics Concern  . Not on file  Social History Narrative   Patient is right-handed. He is married to same sex partner. He gets little exercise.   Caffeine: 4 glasses of tea max/day   Additional Social History:                         Sleep: Good  Appetite:  Good  Current Medications:   Lab Results:  No results found for this or any previous visit (from the past 48 hour(s)).  Physical Findings: AIMS:  , ,  ,  ,  CIWA:    COWS:     Musculoskeletal: Strength & Muscle Tone: within normal limits Gait & Station: normal Patient leans: N/A  Psychiatric Specialty Exam: ROS  There were no vitals taken for this visit.There is no height or weight on file to calculate BMI.  General Appearance: Casual  Eye Contact:: Good   Speech:  Clear and Coherent  Volume:  Normal  Mood:  Euthymic  Affect:  NA and Appropriate  Thought Process:  Coherent  Orientation:  Full (Time, Place, and Person)  Thought Content:  WDL  Suicidal Thoughts:  No  Homicidal Thoughts:  No  Memory:  NA  Judgement:  Good  Insight:  Good  Psychomotor Activity:  Normal  Concentration:  Good  Recall:  Good  Fund of Knowledge:Good  Language: Good  Akathisia:  No  Handed:  Right  AIMS (if indicated):     Assets:  Desire for Improvement  ADL's:  Intact  Cognition: WNL  Sleep:      Treatment Plan Summary: 04/16/2019,    There were no vitals taken for this visit.There is no height or weight on file to calculate BMI.  General  Appearance: Casual  Eye Contact:: Good   Speech:  Clear and Coherent  Volume:  Normal  Mood:  Euthymic  Affect:  NA and Appropriate  Thought Process:  Coherent  Orientation:  Full (Time, Place, and Person)  Thought Content:  WDL  Suicidal Thoughts:  No  Homicidal Thoughts:  No  Memory:  NA  Judgement:  Good  Insight:  Good  Psychomotor Activity:  Normal  Concentration:  Good  Recall:  Good  Fund of Knowledge:Good  Language: Good  Akathisia:  No  Handed:  Right  AIMS (if indicated):     Assets:  Desire for Improvement  ADL's:  Intact  Cognition: WNL  Sleep:      Treatment Plan Summary: 04/16/2019,  Today the patient will actually increase his Depakote to taking 250 mg 2 in the morning and 2 at night.  In 2 weeks we will get a Depakote blood level and a lithium level.  So his first problem is that of bipolar disorder and he takes Depakote and lithium for this.  He also takes Effexor 150 mg.  His second problem is that of insomnia.  He will continue taking trazodone but actually he takes low-dose.  He takes 50 mg 3 at night.  The possibility of increasing this dose should be considered but I think is little sleep disturbance is likely related to some level of hypomania.  When he returns in 1 to 2 months if he is not sleeping better I would consider increasing the trazodone especially if his lithium and Depakote level come back within the normal range.  The blood work would like to get both the Depakote and lithium level but also a comprehensive metabolic panel and a TSH.  He should also get a CBC with differential.  Patient will return to this office in 2 weeks in the morning before he takes his medicine.

## 2019-04-17 ENCOUNTER — Encounter: Payer: Self-pay | Admitting: Physical Medicine and Rehabilitation

## 2019-04-21 DIAGNOSIS — K7460 Unspecified cirrhosis of liver: Principal | ICD-10-CM

## 2019-04-21 DIAGNOSIS — B181 Chronic viral hepatitis B without delta-agent: Principal | ICD-10-CM

## 2019-04-21 DIAGNOSIS — Z79899 Other long term (current) drug therapy: Principal | ICD-10-CM

## 2019-04-25 ENCOUNTER — Ambulatory Visit (HOSPITAL_COMMUNITY): Payer: BC Managed Care – PPO

## 2019-04-28 DIAGNOSIS — K7469 Other cirrhosis of liver: Principal | ICD-10-CM

## 2019-04-28 DIAGNOSIS — B181 Chronic viral hepatitis B without delta-agent: Principal | ICD-10-CM

## 2019-04-28 MED ORDER — VEMLIDY 25 MG TABLET
ORAL_TABLET | Freq: Every day | ORAL | 3 refills | 90 days | Status: CP
Start: 2019-04-28 — End: 2020-04-22

## 2019-04-29 ENCOUNTER — Telehealth (HOSPITAL_COMMUNITY): Payer: Self-pay | Admitting: *Deleted

## 2019-04-29 NOTE — Telephone Encounter (Signed)
Patient called stating he has had severe diarrhea for the past 2 days. He has no other symptoms and believes it is due to the increase in his depakote. This Probation officer notified his psychiatrist who states to have him decrease his depakote to the previous dosage and to have his blood work done next Tuesday. Patient was called back and given instructions as per MD. He has already made his appointment for bloodwork and it is for Tuesday 12/29. He verbalized understanding of medication orders. Will call back if symptoms worsen or develops new symptoms.

## 2019-05-01 ENCOUNTER — Ambulatory Visit (HOSPITAL_COMMUNITY): Payer: BC Managed Care – PPO

## 2019-05-06 ENCOUNTER — Other Ambulatory Visit (HOSPITAL_COMMUNITY): Payer: Self-pay

## 2019-05-06 ENCOUNTER — Ambulatory Visit (HOSPITAL_COMMUNITY): Payer: BC Managed Care – PPO

## 2019-05-06 ENCOUNTER — Other Ambulatory Visit (HOSPITAL_COMMUNITY): Payer: Self-pay | Admitting: Psychiatry

## 2019-05-06 DIAGNOSIS — Z79899 Other long term (current) drug therapy: Secondary | ICD-10-CM

## 2019-05-06 DIAGNOSIS — F3130 Bipolar disorder, current episode depressed, mild or moderate severity, unspecified: Secondary | ICD-10-CM

## 2019-05-06 DIAGNOSIS — B181 Chronic viral hepatitis B without delta-agent: Secondary | ICD-10-CM | POA: Diagnosis not present

## 2019-05-06 DIAGNOSIS — K7460 Unspecified cirrhosis of liver: Secondary | ICD-10-CM | POA: Diagnosis not present

## 2019-05-07 LAB — CBC WITH DIFFERENTIAL/PLATELET
Basophils Absolute: 0 10*3/uL (ref 0.0–0.2)
Basos: 1 %
EOS (ABSOLUTE): 0.1 10*3/uL (ref 0.0–0.4)
Eos: 2 %
Hematocrit: 38 % (ref 37.5–51.0)
Hemoglobin: 12.9 g/dL — ABNORMAL LOW (ref 13.0–17.7)
Immature Grans (Abs): 0 10*3/uL (ref 0.0–0.1)
Immature Granulocytes: 1 %
Lymphocytes Absolute: 0.9 10*3/uL (ref 0.7–3.1)
Lymphs: 19 %
MCH: 32.6 pg (ref 26.6–33.0)
MCHC: 33.9 g/dL (ref 31.5–35.7)
MCV: 96 fL (ref 79–97)
Monocytes Absolute: 0.5 10*3/uL (ref 0.1–0.9)
Monocytes: 10 %
Neutrophils Absolute: 3.4 10*3/uL (ref 1.4–7.0)
Neutrophils: 67 %
Platelets: 95 10*3/uL — CL (ref 150–450)
RBC: 3.96 x10E6/uL — ABNORMAL LOW (ref 4.14–5.80)
RDW: 13.6 % (ref 11.6–15.4)
WBC: 5 10*3/uL (ref 3.4–10.8)

## 2019-05-07 LAB — COMPREHENSIVE METABOLIC PANEL
ALT: 60 IU/L — ABNORMAL HIGH (ref 0–44)
AST: 37 IU/L (ref 0–40)
Albumin/Globulin Ratio: 1.8 (ref 1.2–2.2)
Albumin: 4.2 g/dL (ref 3.8–4.8)
Alkaline Phosphatase: 81 IU/L (ref 39–117)
BUN/Creatinine Ratio: 10 (ref 10–24)
BUN: 8 mg/dL (ref 8–27)
Bilirubin Total: 0.3 mg/dL (ref 0.0–1.2)
CO2: 24 mmol/L (ref 20–29)
Calcium: 10.1 mg/dL (ref 8.6–10.2)
Chloride: 106 mmol/L (ref 96–106)
Creatinine, Ser: 0.81 mg/dL (ref 0.76–1.27)
GFR calc Af Amer: 109 mL/min/{1.73_m2} (ref >59)
GFR calc non Af Amer: 94 mL/min/{1.73_m2} (ref >59)
Globulin, Total: 2.3 g/dL (ref 1.5–4.5)
Glucose: 101 mg/dL — ABNORMAL HIGH (ref 65–99)
Potassium: 4.1 mmol/L (ref 3.5–5.2)
Sodium: 142 mmol/L (ref 134–144)
Total Protein: 6.5 g/dL (ref 6.0–8.5)

## 2019-05-07 LAB — TSH: TSH: 3.16 u[IU]/mL (ref 0.450–4.500)

## 2019-05-07 LAB — VALPROIC ACID LEVEL: Valproic Acid Lvl: <4 ug/mL — ABNORMAL LOW (ref 50–100)

## 2019-05-07 LAB — LITHIUM LEVEL: Lithium Lvl: 0.8 mmol/L (ref 0.6–1.2)

## 2019-05-26 ENCOUNTER — Other Ambulatory Visit: Payer: Self-pay

## 2019-05-26 ENCOUNTER — Ambulatory Visit: Payer: BC Managed Care – PPO | Attending: Neurology | Admitting: Physical Therapy

## 2019-05-26 DIAGNOSIS — M5481 Occipital neuralgia: Secondary | ICD-10-CM | POA: Insufficient documentation

## 2019-05-26 DIAGNOSIS — M542 Cervicalgia: Secondary | ICD-10-CM | POA: Insufficient documentation

## 2019-05-26 DIAGNOSIS — R29898 Other symptoms and signs involving the musculoskeletal system: Secondary | ICD-10-CM | POA: Insufficient documentation

## 2019-05-26 DIAGNOSIS — R293 Abnormal posture: Secondary | ICD-10-CM | POA: Insufficient documentation

## 2019-05-26 NOTE — Patient Instructions (Addendum)

## 2019-05-26 NOTE — Therapy (Signed)
[facility] [address]  [place], [place], [address] Phone: [phone]   Fax:  [phone]  Physical Therapy Evaluation  Patient Details  Name: Angel [name] MRN: [id number] Date of Birth: 05-27-1955 Referring Provider (PT): Sarina Ill, MD   Encounter Date: 05/26/2019  PT End of Session - 05/26/19 1616    Visit Number  1    Number of Visits  12    Date for PT Re-Evaluation  07/07/19    Authorization Type  [employer]    PT Start Time  1616    PT Stop Time  1705    PT Time Calculation (min)  49 min    Activity Tolerance  Patient tolerated treatment well    Behavior During Therapy  [facility] for tasks assessed/performed       Past Medical History:  Diagnosis Date  . Bipolar disorder ([place])   . 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 Depression   . GERD (gastroesophageal reflux disease)   . Headache   . Hepatitis B    Hepatitis E. antigen positive by history, status post treatment with Hepsera and baraclude   . 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 Personal history of colonic polyps    Adenomas polyp 2008 and 2010, 2015   . Weight loss    Pt. has lost 10 lbs since pre-visit, intentionally with diet and walking    Past Surgical History:  Procedure Laterality Date  . APPENDECTOMY    . COLONOSCOPY    . POLYPECTOMY    . UPPER GASTROINTESTINAL ENDOSCOPY      There were no vitals filed for this visit.   Subjective Assessment - 05/26/19 1620    Subjective  Pt reports h/o headaches for ~6 months. At times so severe that he has gone to the ER twice. Was then able to get toradol shots from MD office and eventually started on oral meds. Also notes relief from epidural injections. Pain worse when driving or on computer but has cut back on computer use with some relief noted.    Limitations  Sitting;Walking    How long can you sit comfortably?  30-60 minutes    How long can you walk comfortably?  has stopped walking  for exercise due to neck pain    Patient Stated Goals  "To determine if I need surgery. To relieve pain from my neck and get rid of the headaches w/o meds."    Currently in Pain?  Yes    Pain Score  2     Pain Location  Neck    Pain Orientation  Upper;Left;Right   L>R   Pain Descriptors / Indicators  Constant    Pain Type  Chronic pain    Pain Radiating Towards  intermittent radiation up back of head leading to headache    Pain Onset  More than a month ago   ~6 months   Pain Frequency  Intermittent    Aggravating Factors   driving, using the computer, mania    Pain Relieving Factors  injections or ketorolac, ice pack    Effect of Pain on Daily Activities  avoids computer use, has stopped walking for exercise due to pain         [facility] PT Assessment - 05/26/19 1616      Assessment   Medical Diagnosis  L cervical myofascial pain & occipital neuralgia    Referring Provider (PT)  Sarina Ill, MD  Onset Date/Surgical Date  --   6 months   Hand Dominance  Right    Next MD Visit  TBD    Prior Therapy  none for current condition; PT in 2019 for balance      Precautions   Precautions  Fall      Balance Screen   Has the patient fallen in the past 6 months  Yes    How many times?  2 - usually the result of tripping over something    Has the patient had a decrease in activity level because of a fear of falling?   Yes   20% decrease   Is the patient reluctant to leave their home because of a fear of falling?   No      Home Environment   Living Environment  Private residence    Living Arrangements  Spouse/significant other    Type of [place] to enter   front entrance only; threshold only from garage   [place]  Two level;Bed/bath [place] - single point      Prior Function   Level of [place]  Retired    Chartered certified accountant; walking 1 mile almost daily      Cognition   Overall Cognitive  Status  Within Functional Limits for tasks assessed      Observation/Other Assessments   Focus on Therapeutic Outcomes (FOTO)   Neck - 59% (41% limitation); Predicted 65% (35% limitation)      Posture/Postural Control   Posture/Postural Control  Postural limitations    Postural Limitations  Forward head;Rounded Shoulders      ROM / Strength   AROM / PROM / Strength  AROM;Strength      AROM   Overall AROM   Within functional limits for tasks performed   B shoulders   AROM Assessment Site  Cervical    Cervical Flexion  42   pain   Cervical Extension  40    Cervical - Right Side Bend  25   pain   Cervical - Left Side Bend  22   pain   Cervical - Right Rotation  40   pain   Cervical - Left Rotation  50      Strength   Overall Strength  Within functional limits for tasks performed    Strength Assessment Site  Shoulder      Palpation   Palpation comment  ttp with increased muscle tension t/o cervical paraspinals, lateral cervical muscles and UT (L>R); increased muscle tension in B pecs but denies ttp      Special Tests    Special Tests  Cervical    Cervical Tests  Dictraction      Distraction Test   Findngs  Negative                Objective measurements completed on examination: See above findings.              PT Education - 05/26/19 1705    Education Details  PT eval findings, anticipated POC, initial postural education    Person(s) Educated  Patient    Methods  Explanation;Demonstration;Handout    Comprehension  Verbalized understanding;Need further instruction       PT Short Term Goals - 05/26/19 1705      PT SHORT TERM GOAL #1   Title  Patient to be independent with  initial HEP.    Status  New    Target Date  06/16/19      PT SHORT TERM GOAL #2   Title  Patient will verbalize/demonstrate understanding of neutral spine posture and proper body mechanics to reduce strain on cervical spine.    Status  New    Target Date  06/16/19         PT Long Term Goals - 05/26/19 1705      PT LONG TERM GOAL #1   Title  Patient to be independent with advanced/ongoing HEP.    Status  New    Target Date  07/07/19      PT LONG TERM GOAL #2   Title  Patient will verbalize/demonstrate good awareness of neutral spine posture and proper body mechanics for daily tasks including computer usage.    Status  New    Target Date  07/07/19      PT LONG TERM GOAL #3   Title  Patient to improve cervical AROM to WNL without pain provocation.    Status  New    Target Date  07/07/19      PT LONG TERM GOAL #4   Title  Patient to report pain reduction in frequency and intensity of headaches by >/= 50%.    Status  New    Target Date  07/07/19      PT LONG TERM GOAL #5   Title  Patient will be able to resume walking for exercise w/o exacerbation of neck pain or headaches.    Status  New    Target Date  07/07/19             Plan - 05/26/19 1705    Clinical Impression Statement  Angel [name] is a 64 y/o male who presents to OP PT for L cervical myofascial pain and occipital neuralgia of ~6 months duration. He reports frequent headaches sometimes so severe that he has to go to the ER and can only get relief from injections. Pain appears to be exacerbated by prolonged time on the computer or driving and has improved somewhat since he has cut back on the computer time. He demonstrates a forward head and rounded shoulder posture with increased muscle tension noted throughout B cervical paraspinals, lateral cervical musculature, upper traps and pectoralis muscles. Cervical AROM limited in all planes with pain noted on all movements except extension. Bilateral shoulder ROM and strength WNL. Pain and headaches have cost him to stop walking for exercise which has led to him gaining weight. Angel [name] will benefit from skilled PT to address above deficits and current functional limitations as well as to decrease pain interference with daily activities.    Personal  Factors and Comorbidities  Comorbidity 3+;Time since onset of injury/illness/exacerbation;Fitness;Past/Current Experience    Comorbidities  bipolar disorder, hepatitis B, hepatic cirrhosis, GERD, h/o falls    Examination-Activity Limitations  Locomotion Level;Sleep;Sit    Examination-Participation Restrictions  Community Activity;Driving;Interpersonal Relationship    Stability/Clinical Decision Making  Evolving/Moderate complexity    Clinical Decision Making  Moderate    Rehab Potential  Good    PT Frequency  2x / week    PT Duration  6 weeks    PT Treatment/Interventions  ADLs/Self Care Home Management;Cryotherapy;Electrical Stimulation;Moist Heat;Traction;Ultrasound;Functional mobility training;Therapeutic activities;Therapeutic exercise;Balance training;Neuromuscular re-education;Patient/family education;Manual techniques;Passive range of motion;Dry needling;Taping;Spinal Manipulations;Joint Manipulations    PT Next Visit Plan  Establish initial HEP for postural stretching/strengthening; posture and body mechanics education; manual therapy including possible DN; modalities PRN  Consulted and Agree with Plan of Care  Patient       Patient will benefit from skilled therapeutic intervention in order to improve the following deficits and impairments:  Decreased activity tolerance, Decreased balance, Decreased endurance, Decreased knowledge of precautions, Decreased mobility, Decreased range of motion, Decreased strength, Difficulty walking, Increased fascial restricitons, Increased muscle spasms, Impaired perceived functional ability, Impaired flexibility, Impaired UE functional use, Improper body mechanics, Postural dysfunction, Pain  Visit Diagnosis: Occipital neuralgia of left side  Cervicalgia  Abnormal posture  Other symptoms and signs involving the musculoskeletal system     Problem List Patient Active Problem List   Diagnosis Date Noted  . Occipital neuralgia of left side  04/10/2019  . Hyperglycemia 10/07/2013  . Hepatic cirrhosis ([place]) 10/03/2013  . Bipolar I disorder, most recent episode (or current) depressed, unspecified 05/21/2013  . Hepatitis B 03/21/2013  . Esophageal reflux 03/21/2013  . Personal history of colonic polyps 03/21/2013  . Bipolar disorder, unspecified ([place]) 09/03/2011    Percival Spanish, PT, MPT 05/26/2019, 8:48 PM  [facility] [address]  [place], [place], [address] Phone: [phone]   Fax:  [phone]  Name: Angel [name] MRN: [id number] Date of Birth: 1956/05/06

## 2019-05-28 ENCOUNTER — Ambulatory Visit: Payer: BC Managed Care – PPO | Admitting: Neurology

## 2019-06-02 ENCOUNTER — Other Ambulatory Visit: Payer: Self-pay

## 2019-06-02 ENCOUNTER — Ambulatory Visit: Payer: BC Managed Care – PPO

## 2019-06-02 DIAGNOSIS — M542 Cervicalgia: Secondary | ICD-10-CM

## 2019-06-02 DIAGNOSIS — R29898 Other symptoms and signs involving the musculoskeletal system: Secondary | ICD-10-CM

## 2019-06-02 DIAGNOSIS — R293 Abnormal posture: Secondary | ICD-10-CM | POA: Diagnosis not present

## 2019-06-02 DIAGNOSIS — M5481 Occipital neuralgia: Secondary | ICD-10-CM | POA: Diagnosis not present

## 2019-06-02 NOTE — Therapy (Signed)
[facility] [address]  [place], [place], [address] Phone: [phone]   Fax:  [phone]  Physical Therapy Treatment  Patient Details  Name: Angel [name] MRN: [id number] Date of Birth: 1955/07/19 Referring Provider (PT): Sarina Ill, MD   Encounter Date: 06/02/2019  PT End of Session - 06/02/19 1411    Visit Number  2    Number of Visits  12    Date for PT Re-Evaluation  07/07/19    Authorization Type  BCBS    PT Start Time  1403    PT Stop Time  1445    PT Time Calculation (min)  42 min    Activity Tolerance  Patient tolerated treatment well    Behavior During Therapy  [facility] for tasks assessed/performed       Past Medical History:  Diagnosis Date  . Bipolar disorder ([place])   . 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 Depression   . GERD (gastroesophageal reflux disease)   . Headache   . Hepatitis B    Hepatitis E. antigen positive by history, status post treatment with Hepsera and baraclude   . Hepatitis B carrier ([place])   . Personal history of colonic polyps    Adenomas polyp 2008 and 2010, 2015   . Weight loss    Pt. has lost 10 lbs since pre-visit, intentionally with diet and walking    Past Surgical History:  Procedure Laterality Date  . APPENDECTOMY    . COLONOSCOPY    . POLYPECTOMY    . UPPER GASTROINTESTINAL ENDOSCOPY      There were no vitals filed for this visit.  Subjective Assessment - 06/02/19 1407    Subjective  Pt. doing well.    Patient Stated Goals  "To determine if I need surgery. To relieve pain from my neck and get rid of the headaches w/o meds."    Currently in Pain?  Yes    Pain Score  3     Pain Location  Neck    Pain Orientation  Upper;Left;Right    Pain Descriptors / Indicators  Cramping    Pain Type  Chronic pain    Pain Radiating Towards  intermittent radiation up back of head leading to headache    Pain Onset  More than a month ago    Pain Frequency   Intermittent    Aggravating Factors   driving, using the computer, mania    Multiple Pain Sites  No                       [facility] Adult PT Treatment/Exercise - 06/02/19 0001      Self-Care   Self-Care  Other Self-Care Comments;Posture    Posture  Instrucion in proper desk posture to reduce cervical strain     Other Self-Care Comments   Instruction on propere posture and body mechanics with daily activitis such as vacuuming and sweeping       Neck Exercises: Machines for Strengthening   Nustep  Lvl 3, 6 min (UE/LE)      Neck Exercises: Seated   Neck Retraction  10 reps;3 secs    Neck Retraction Limitations  2 sets - improved technique after cueing on second set    Other Seated Exercise  seated scapular retraction 5" x 10 reps       Neck Exercises: Stretches   Upper Trapezius Stretch  3 reps;Right;Left;30 seconds  Upper Trapezius Stretch Limitations  opposite hand anchored on table and behind back    Levator Stretch  3 reps;Right;Left;30 seconds    Levator Stretch Limitations  opposite hand anchored on table     Chest Stretch  2 reps;30 seconds   cues required for positioning    Chest Stretch Limitations  low doorway stretch              PT Education - 06/02/19 1525    Education Details  Initial HEP;  chin tuck, UT, LS stretch, doorway stretch;  handout for posture and body mechanics    Person(s) Educated  Patient    Methods  Explanation;Demonstration;Verbal cues;Handout    Comprehension  Verbalized understanding;Returned demonstration;Verbal cues required       PT Short Term Goals - 06/02/19 1412      PT SHORT TERM GOAL #1   Title  Patient to be independent with initial HEP.    Status  On-going    Target Date  06/16/19      PT SHORT TERM GOAL #2   Title  Patient will verbalize/demonstrate understanding of neutral spine posture and proper body mechanics to reduce strain on cervical spine.    Status  On-going    Target Date  06/16/19        PT  Long Term Goals - 06/02/19 1412      PT LONG TERM GOAL #1   Title  Patient to be independent with advanced/ongoing HEP.    Status  On-going      PT LONG TERM GOAL #2   Title  Patient will verbalize/demonstrate good awareness of neutral spine posture and proper body mechanics for daily tasks including computer usage.    Status  On-going      PT LONG TERM GOAL #3   Title  Patient to improve cervical AROM to WNL without pain provocation.    Status  On-going      PT LONG TERM GOAL #4   Title  Patient to report pain reduction in frequency and intensity of headaches by >/= 50%.    Status  On-going      PT LONG TERM GOAL #5   Title  Patient will be able to resume walking for exercise w/o exacerbation of neck pain or headaches.    Status  On-going            Plan - 06/02/19 1412    Clinical Impression Statement  Angel [name] doing well.  Primary complaint to start session was central neck pain which extended superior to back of head.  Focus of session on formation of initial HEP for gentle postural stretching and strengthening and instruction in proper posture and body mechanics to reduce cervical strain with daily tasks.  Significant time spent discussing proper computer desk setup to reduce cervical strain.  Pt. admits that he is currently sitting in cushioned sofa chair at computer on daily basis looking down and to R side "copying down papers".  Encouraged pt. to utilize business easel or other document holder for papers to avoid prolonged cervical flexion/rotation at computer.  Pt. reporting he plans to adjust desk setup to avoid further cervical strain.  Will plan to continue addressing posture and body mechanics and tolerance to initial HEP in coming session.     Comorbidities  bipolar disorder, hepatitis B, hepatic cirrhosis, GERD, h/o falls    Rehab Potential  Good    PT Treatment/Interventions  ADLs/Self Care Home Management;Cryotherapy;Electrical Stimulation;Moist  Heat;Traction;Ultrasound;Functional mobility training;Therapeutic activities;Therapeutic exercise;Balance  training;Neuromuscular re-education;Patient/family education;Manual techniques;Passive range of motion;Dry needling;Taping;Spinal Manipulations;Joint Manipulations    PT Next Visit Plan  Monitor tolerance to initial HEP for postural stretching/strengthening; posture and body mechanics education; manual therapy including possible DN; modalities PRN    PT Home Exercise Plan  06/02/19 - chin tuck, UT, LS stretch, doorway stretch    Consulted and Agree with Plan of Care  Patient       Patient will benefit from skilled therapeutic intervention in order to improve the following deficits and impairments:  Decreased activity tolerance, Decreased balance, Decreased endurance, Decreased knowledge of precautions, Decreased mobility, Decreased range of motion, Decreased strength, Difficulty walking, Increased fascial restricitons, Increased muscle spasms, Impaired perceived functional ability, Impaired flexibility, Impaired UE functional use, Improper body mechanics, Postural dysfunction, Pain  Visit Diagnosis: Occipital neuralgia of left side  Cervicalgia  Abnormal posture  Other symptoms and signs involving the musculoskeletal system     Problem List Patient Active Problem List   Diagnosis Date Noted  . Occipital neuralgia of left side 04/10/2019  . Hyperglycemia 10/07/2013  . Hepatic cirrhosis ([place]) 10/03/2013  . Bipolar I disorder, most recent episode (or current) depressed, unspecified 05/21/2013  . Hepatitis B 03/21/2013  . Esophageal reflux 03/21/2013  . Personal history of colonic polyps 03/21/2013  . Bipolar disorder, unspecified ([place]) 09/03/2011    Bess Harvest, PTA 06/02/19 6:01 PM    [facility] [address]  [place], [place], [address] Phone: [phone]   Fax:  [phone]  Name: Angel [name] MRN:  [id number] Date of Birth: 1956-03-24

## 2019-06-02 NOTE — Patient Instructions (Addendum)

## 2019-06-04 ENCOUNTER — Encounter: Payer: Self-pay | Admitting: Physical Therapy

## 2019-06-04 ENCOUNTER — Ambulatory Visit: Payer: BC Managed Care – PPO | Admitting: Physical Therapy

## 2019-06-04 ENCOUNTER — Other Ambulatory Visit: Payer: Self-pay

## 2019-06-04 DIAGNOSIS — R293 Abnormal posture: Secondary | ICD-10-CM | POA: Diagnosis not present

## 2019-06-04 DIAGNOSIS — M542 Cervicalgia: Secondary | ICD-10-CM | POA: Diagnosis not present

## 2019-06-04 DIAGNOSIS — R29898 Other symptoms and signs involving the musculoskeletal system: Secondary | ICD-10-CM

## 2019-06-04 DIAGNOSIS — M5481 Occipital neuralgia: Secondary | ICD-10-CM

## 2019-06-04 NOTE — Therapy (Addendum)
[facility] [address]  [place], [place], [address] Phone: [phone]   Fax:  [phone]  Physical Therapy Treatment  Patient Details  Name: Angel [name] MRN: [id number] Date of Birth: 11/10/1955 Referring Provider (PT): Sarina Ill, MD   Encounter Date: 06/04/2019  PT End of Session - 06/04/19 1147    Visit Number  3    Number of Visits  12    Date for PT Re-Evaluation  07/07/19    Authorization Type  BCBS    PT Start Time  1147    PT Stop Time  1234    PT Time Calculation (min)  47 min    Activity Tolerance  Patient tolerated treatment well    Behavior During Therapy  [facility] for tasks assessed/performed       Past Medical History:  Diagnosis Date  . Bipolar disorder ([place])   . 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 Depression   . GERD (gastroesophageal reflux disease)   . Headache   . Hepatitis B    Hepatitis E. antigen positive by history, status post treatment with Hepsera and baraclude   . Hepatitis B carrier ([place])   . Personal history of colonic polyps    Adenomas polyp 2008 and 2010, 2015   . Weight loss    Pt. has lost 10 lbs since pre-visit, intentionally with diet and walking    Past Surgical History:  Procedure Laterality Date  . APPENDECTOMY    . COLONOSCOPY    . POLYPECTOMY    . UPPER GASTROINTESTINAL ENDOSCOPY      There were no vitals filed for this visit.  Subjective Assessment - 06/04/19 1151    Subjective  Pt reporting the headaches are not as common anymore, although "still on the 1-month shot for headaches".    Patient Stated Goals  "To determine if I need surgery. To relieve pain from my neck and get rid of the headaches w/o meds."    Currently in Pain?  Yes    Pain Score  2     Pain Location  Neck    Pain Orientation  Lower    Pain Descriptors / Indicators  --   "steady"   Pain Type  Chronic pain    Pain Frequency  Intermittent                        [facility] Adult PT Treatment/Exercise - 06/04/19 1147      Exercises   Exercises  Neck      Neck Exercises: Machines for Strengthening   Nustep  L3 x 6 min (UE/LE)      Neck Exercises: Seated   Neck Retraction  10 reps;3 secs   2 sets   Neck Retraction Limitations  2nd set completed in chair to minimize torso movement    Other Seated Exercise  Scapular retraction + depression 10 x 5"       Neck Exercises: Stretches   Upper Trapezius Stretch  Right;Left;30 seconds    Upper Trapezius Stretch Limitations  cues for hand placement and strap assistance to avoid elevating shoulder    Levator Stretch  Right;Left;30 seconds;3 reps    Levator Stretch Limitations  cues to keep hand behind/under buttock to maintain neutral shoulder    Corner Stretch  30 seconds;2 reps    Corner Stretch Limitations  low doorway stretch      Manual Therapy  Manual Therapy  Soft tissue mobilization;Myofascial release    Manual therapy comments  prone & supine   Soft tissue mobilization  STM/DTM to B UT, LS, gentle STM to B cervical paraspinals     Myofascial Release  manual TPR to B UT       Trigger Point Dry Needling - 06/04/19 1147    Consent Given?  Yes    Education Handout Provided  Previously provided    Muscles Treated Head and Neck  Upper trapezius   bilateral   Upper Trapezius Response  Twitch reponse elicited;Palpable increased muscle length           PT Education - 06/04/19 1234    Education Details  Role of DN, expected response to treatment and recommended post-treatment activity level; HEP review & update - scapular retraction    Person(s) Educated  Patient    Methods  Explanation;Demonstration;Verbal cues;Tactile cues;Handout    Comprehension  Verbalized understanding;Returned demonstration;Verbal cues required;Tactile cues required;Need further instruction       PT Short Term Goals - 06/02/19 1412      PT SHORT TERM GOAL #1   Title   Patient to be independent with initial HEP.    Status  On-going    Target Date  06/16/19      PT SHORT TERM GOAL #2   Title  Patient will verbalize/demonstrate understanding of neutral spine posture and proper body mechanics to reduce strain on cervical spine.    Status  On-going    Target Date  06/16/19        PT Long Term Goals - 06/02/19 1412      PT LONG TERM GOAL #1   Title  Patient to be independent with advanced/ongoing HEP.    Status  On-going      PT LONG TERM GOAL #2   Title  Patient will verbalize/demonstrate good awareness of neutral spine posture and proper body mechanics for daily tasks including computer usage.    Status  On-going      PT LONG TERM GOAL #3   Title  Patient to improve cervical AROM to WNL without pain provocation.    Status  On-going      PT LONG TERM GOAL #4   Title  Patient to report pain reduction in frequency and intensity of headaches by >/= 50%.    Status  On-going      PT LONG TERM GOAL #5   Title  Patient will be able to resume walking for exercise w/o exacerbation of neck pain or headaches.    Status  On-going            Plan - 06/04/19 1155    Clinical Impression Statement  Kirk neck pain minimal today and no recent headaches. He continues to demonstrate R shoulder elevation and slight protraction with increased muscle tension evident in R>L UT and lower cervical paraspinals. Gib expressing interest in DN which is appropriate to address abnormal muscle tension and TPs identified, therefore DN performed in conjunction with manual STM/MFR upon informed patient consent. Strong twitch response elicited in R UT with resulting palpable reduction in muscle tension and better shoulder alignment following manual and DN. HEP reviewed reinforcing stretching of muscles addressed with manual therapy today as well as strengthening of postural muscles. 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    Personal Factors and  Comorbidities  Comorbidity 3+;Time since onset of injury/illness/exacerbation;Fitness;Past/Current Experience    Comorbidities  bipolar disorder, hepatitis B, hepatic cirrhosis, GERD, h/o falls    Examination-Activity Limitations  Locomotion Level;Sleep;Sit    Examination-Participation Restrictions  Community Activity;Driving;Interpersonal Relationship    Rehab Potential  Good    PT Frequency  2x / week    PT Duration  6 weeks    PT Treatment/Interventions  ADLs/Self Care Home Management;Cryotherapy;Electrical Stimulation;Moist Heat;Traction;Ultrasound;Functional mobility training;Therapeutic activities;Therapeutic exercise;Balance training;Neuromuscular re-education;Patient/family education;Manual techniques;Passive range of motion;Dry needling;Taping;Spinal Manipulations;Joint Manipulations    PT Next Visit Plan  Monitor tolerance to initial HEP for postural stretching/strengthening; posture and body mechanics education; manual therapy including possible DN; modalities PRN    PT Home Exercise Plan  06/02/19 - chin tuck, UT, LS stretch, doorway stretch; 06/04/19 - scapular retraction    Consulted and Agree with Plan of Care  Patient       Patient will benefit from skilled therapeutic intervention in order to improve the following deficits and impairments:  Decreased activity tolerance, Decreased balance, Decreased endurance, Decreased knowledge of precautions, Decreased mobility, Decreased range of motion, Decreased strength, Difficulty walking, Increased fascial restricitons, Increased muscle spasms, Impaired perceived functional ability, Impaired flexibility, Impaired UE functional use, Improper body mechanics, Postural dysfunction, Pain  Visit Diagnosis: Occipital neuralgia of left side  Cervicalgia  Abnormal posture  Other symptoms and signs involving the musculoskeletal  system     Problem List Patient Active Problem List   Diagnosis Date Noted  . Occipital neuralgia of left side 04/10/2019  . Hyperglycemia 10/07/2013  . Hepatic cirrhosis ([place]) 10/03/2013  . Bipolar I disorder, most recent episode (or current) depressed, unspecified 05/21/2013  . Hepatitis B 03/21/2013  . Esophageal reflux 03/21/2013  . Personal history of colonic polyps 03/21/2013  . Bipolar disorder, unspecified ([place]) 09/03/2011    Percival Spanish, PT, MPT 06/04/2019, 12:59 PM  [facility] [address]  [place], [place], [address] Phone: [phone]   Fax:  [phone]  Name: Makih Severson MRN: [id number] Date of Birth: 1955-09-26

## 2019-06-04 NOTE — Patient Instructions (Signed)
    Home exercise program created by Tanyia Grabbe, PT.  For questions, please contact Boubacar Lerette via phone at [phone] or email at [email or web address]  [facility] [address]  [address] [place], [place], [address] Phone: [phone]   Fax:  [phone]    

## 2019-06-06 ENCOUNTER — Other Ambulatory Visit: Payer: Self-pay | Admitting: Medical

## 2019-06-09 ENCOUNTER — Other Ambulatory Visit: Payer: Self-pay

## 2019-06-09 ENCOUNTER — Encounter: Payer: Self-pay | Admitting: Medical

## 2019-06-09 ENCOUNTER — Ambulatory Visit: Payer: BC Managed Care – PPO | Attending: Neurology

## 2019-06-09 DIAGNOSIS — M5481 Occipital neuralgia: Secondary | ICD-10-CM | POA: Diagnosis not present

## 2019-06-09 DIAGNOSIS — R2681 Unsteadiness on feet: Secondary | ICD-10-CM | POA: Diagnosis not present

## 2019-06-09 DIAGNOSIS — R293 Abnormal posture: Secondary | ICD-10-CM

## 2019-06-09 DIAGNOSIS — M6281 Muscle weakness (generalized): Secondary | ICD-10-CM | POA: Insufficient documentation

## 2019-06-09 DIAGNOSIS — R29898 Other symptoms and signs involving the musculoskeletal system: Secondary | ICD-10-CM | POA: Diagnosis not present

## 2019-06-09 DIAGNOSIS — R2689 Other abnormalities of gait and mobility: Secondary | ICD-10-CM | POA: Diagnosis not present

## 2019-06-09 DIAGNOSIS — M542 Cervicalgia: Secondary | ICD-10-CM | POA: Insufficient documentation

## 2019-06-09 NOTE — Telephone Encounter (Signed)
Rx refill of robaxin sent in. Decided to give 90 tabs. Might rx higher number but would like to see if that is needed as has seen specialist and gone to PT recently.

## 2019-06-09 NOTE — Telephone Encounter (Signed)
Refill request: methocarbamol 750mg  Last refill: 02/28/2019 #120 x 1 RF Last OV: 03/24/2019 Next OV: Not scheduled

## 2019-06-09 NOTE — Therapy (Addendum)
[facility] [address]  [place], [place], [address] Phone: [phone]   Fax:  [phone]  Physical Therapy Treatment  Patient Details  Name: Angel [name] MRN: [id number] Date of Birth: 06/28/1955 Referring Provider (PT): Sarina Ill, MD   Encounter Date: 06/09/2019  PT End of Session - 06/09/19 1416    Visit Number  4    Number of Visits  12    Date for PT Re-Evaluation  07/07/19    Authorization Type  BCBS    PT Start Time  9924    PT Stop Time  1459    PT Time Calculation (min)  55 min    Activity Tolerance  Patient tolerated treatment well    Behavior During Therapy  [facility] for tasks assessed/performed       Past Medical History:  Diagnosis Date  . Bipolar disorder ([place])   . 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 Depression   . GERD (gastroesophageal reflux disease)   . Headache   . Hepatitis B    Hepatitis E. antigen positive by history, status post treatment with Hepsera and baraclude   . Hepatitis B carrier ([place])   . Personal history of colonic polyps    Adenomas polyp 2008 and 2010, 2015   . Weight loss    Pt. has lost 10 lbs since pre-visit, intentionally with diet and walking    Past Surgical History:  Procedure Laterality Date  . APPENDECTOMY    . COLONOSCOPY    . POLYPECTOMY    . UPPER GASTROINTESTINAL ENDOSCOPY      There were no vitals filed for this visit.  Subjective Assessment - 06/09/19 1412    Subjective  Pt. noting decreased pain.  Has "been off of the computer".    Patient Stated Goals  "To determine if I need surgery. To relieve pain from my neck and get rid of the headaches w/o meds."    Currently in Pain?  No/denies    Pain Score  0-No pain   4/10 pain at most while sitting on the sofa   Pain Location  Neck    Pain Orientation  Lower    Pain Descriptors / Indicators  Dull   "steady"                      [facility] Adult PT Treatment/Exercise  - 06/09/19 0001      Self-Care   Self-Care  Other Self-Care Comments    Posture  Further discussed importance of proper desk setup and sitting posture to reduce lumbar/cervical strain; pt. reporting he has reduce his computer time this week however still sitting in "sofa chair" which is excessively low by pt. description       Neck Exercises: Machines for Strengthening   UBE (Upper Arm Bike)  Lvl 1.0, 3 min forward/3 min backwards       Neck Exercises: Theraband   Rows  15 reps;Red    Rows Limitations  standing in door   cues for scapular depression and upright trunk   Shoulder External Rotation  --    Horizontal ABduction  15 reps;Red    Horizontal ABduction Limitations  Hooklying on table    3" hold    Other Theraband Exercises  Alternating flexion/ext. with red TB hooklying on mat table x 10 reps       Neck Exercises: Seated   Neck Retraction  15 reps;5 secs  Neck Retraction Limitations  seated with cueing to avoid trunk motion       Neck Exercises: Supine   Neck Retraction  10 reps;3 secs      Manual Therapy   Manual Therapy  Soft tissue mobilization;Myofascial release    Manual therapy comments  seated     Soft tissue mobilization  STM/DTM to B UT, LS, gentle STM to B cervical paraspinals     Myofascial Release  TPR to R UT      Neck Exercises: Stretches   Upper Trapezius Stretch  Right;Left;30 seconds;1 rep    Upper Trapezius Stretch Limitations  hands anchored on chair     Levator Stretch  Right;Left;30 seconds;1 rep    Levator Stretch Limitations  hands anchored on chair     Corner Stretch  30 seconds;2 reps    Corner Stretch Limitations  low doorway stretch    Chest Stretch  2 reps;30 seconds    Chest Stretch Limitations  Hooklying on table; in "cross position"             PT Education - 06/09/19 1705    Education Details  HEP update;  row with red TB closed in door    Person(s) Educated  Patient    Methods  Explanation;Demonstration;Verbal cues;Handout     Comprehension  Verbalized understanding;Returned demonstration;Verbal cues required       PT Short Term Goals - 06/09/19 1512      PT SHORT TERM GOAL #1   Title  Patient to be independent with initial HEP.    Status  Achieved    Target Date  06/16/19      PT SHORT TERM GOAL #2   Title  Patient will verbalize/demonstrate understanding of neutral spine posture and proper body mechanics to reduce strain on cervical spine.    Status  On-going    Target Date  06/16/19        PT Long Term Goals - 06/02/19 1412      PT LONG TERM GOAL #1   Title  Patient to be independent with advanced/ongoing HEP.    Status  On-going      PT LONG TERM GOAL #2   Title  Patient will verbalize/demonstrate good awareness of neutral spine posture and proper body mechanics for daily tasks including computer usage.    Status  On-going      PT LONG TERM GOAL #3   Title  Patient to improve cervical AROM to WNL without pain provocation.    Status  On-going      PT LONG TERM GOAL #4   Title  Patient to report pain reduction in frequency and intensity of headaches by >/= 50%.    Status  On-going      PT LONG TERM GOAL #5   Title  Patient will be able to resume walking for exercise w/o exacerbation of neck pain or headaches.    Status  On-going            Plan - 06/09/19 1418    Clinical Impression Statement  Pt. reporting he has had most relief from DN performed last session.  Pt. requesting to perform more DN in upcoming visits.  Reports he is performing HEP daily and denies questions at this point.  STG #1 met.  Still discussing proper desk setup and positioning with patient as he still spends significant amount of time at computer desk, "copying down papers" while sitting in "sofa chair" at home.  Pt. admitting that  sofa computer chair does not provide him with adequate lumbar/back support and encouraged to seat out other desk chair setup to avoid excessive back/cervical strain.  Pt. verbalizing  understanding.  Session focused on initiation of scapular strengthening with red TB resistance and HEP updated with red TB row as this was well tolerated without increased pain.  Ended visit with STM/DTM to B upper shoulder and cervical musculature with pt. noting improved comfort following.  Did identify multiple TPs in R/L upper shoulder musculature.  Will continue to speak with patient regarding proper computer desk setup to reduce cervical strain.    Comorbidities  bipolar disorder, hepatitis B, hepatic cirrhosis, GERD, h/o falls    Rehab Potential  Good    PT Treatment/Interventions  ADLs/Self Care Home Management;Cryotherapy;Electrical Stimulation;Moist Heat;Traction;Ultrasound;Functional mobility training;Therapeutic activities;Therapeutic exercise;Balance training;Neuromuscular re-education;Patient/family education;Manual techniques;Passive range of motion;Dry needling;Taping;Spinal Manipulations;Joint Manipulations    PT Next Visit Plan  Monitor tolerance to initial HEP for postural stretching/strengthening; posture and body mechanics education; manual therapy including possible DN; modalities PRN    PT Home Exercise Plan  06/02/19 - chin tuck, UT, LS stretch, doorway stretch; 06/04/19 - scapular retraction; 06/119 - row with red TB    Consulted and Agree with Plan of Care  Patient       Patient will benefit from skilled therapeutic intervention in order to improve the following deficits and impairments:  Decreased activity tolerance, Decreased balance, Decreased endurance, Decreased knowledge of precautions, Decreased mobility, Decreased range of motion, Decreased strength, Difficulty walking, Increased fascial restricitons, Increased muscle spasms, Impaired perceived functional ability, Impaired flexibility, Impaired UE functional use, Improper body mechanics, Postural dysfunction, Pain  Visit Diagnosis: Occipital neuralgia of left side  Cervicalgia  Abnormal posture  Other symptoms and  signs involving the musculoskeletal system     Problem List Patient Active Problem List   Diagnosis Date Noted  . Occipital neuralgia of left side 04/10/2019  . Hyperglycemia 10/07/2013  . Hepatic cirrhosis ([place]) 10/03/2013  . Bipolar I disorder, most recent episode (or current) depressed, unspecified 05/21/2013  . Hepatitis B 03/21/2013  . Esophageal reflux 03/21/2013  . Personal history of colonic polyps 03/21/2013  . Bipolar disorder, unspecified ([place]) 09/03/2011    Bess Harvest, PTA 06/09/19 5:08 PM   [facility] [address]  [place], [place], [address] Phone: [phone]   Fax:  [phone]  Name: Angel [name] MRN: [id number] Date of Birth: 12-13-1955

## 2019-06-11 ENCOUNTER — Ambulatory Visit: Payer: BC Managed Care – PPO

## 2019-06-11 ENCOUNTER — Other Ambulatory Visit: Payer: Self-pay

## 2019-06-11 DIAGNOSIS — R293 Abnormal posture: Secondary | ICD-10-CM | POA: Diagnosis not present

## 2019-06-11 DIAGNOSIS — R29898 Other symptoms and signs involving the musculoskeletal system: Secondary | ICD-10-CM | POA: Diagnosis not present

## 2019-06-11 DIAGNOSIS — M542 Cervicalgia: Secondary | ICD-10-CM

## 2019-06-11 DIAGNOSIS — M5481 Occipital neuralgia: Secondary | ICD-10-CM | POA: Diagnosis not present

## 2019-06-11 DIAGNOSIS — M6281 Muscle weakness (generalized): Secondary | ICD-10-CM | POA: Diagnosis not present

## 2019-06-11 DIAGNOSIS — R2689 Other abnormalities of gait and mobility: Secondary | ICD-10-CM | POA: Diagnosis not present

## 2019-06-11 DIAGNOSIS — R2681 Unsteadiness on feet: Secondary | ICD-10-CM | POA: Diagnosis not present

## 2019-06-11 NOTE — Therapy (Signed)
[facility] [address]  [name], [place], [address] Phone: [phone]   Fax:  [phone]  Physical Therapy Treatment  Patient Details  Name: Angel [name] MRN: [id number] Date of Birth: [birth year] Referring Provider (PT): Sarina Ill, MD   Encounter Date: 06/11/2019  PT End of Session - 06/11/19 1157    Visit Number  5    Number of Visits  12    Date for PT Re-Evaluation  07/07/19    Authorization Type  BCBS    PT Start Time  1104    PT Stop Time  1204   Ended visit with 10 min moist heat   PT Time Calculation (min)  60 min    Activity Tolerance  Patient tolerated treatment well    Behavior During Therapy  [facility] for tasks assessed/performed       Past Medical History:  Diagnosis Date  . Bipolar disorder ([place])   . 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 Depression   . GERD (gastroesophageal reflux disease)   . Headache   . Hepatitis B    Hepatitis E. antigen positive by history, status post treatment with Hepsera and baraclude   . Hepatitis B carrier ([place])   . Personal history of colonic polyps    Adenomas polyp 2008 and 2010, 2015   . Weight loss    Pt. has lost 10 lbs since pre-visit, intentionally with diet and walking    Past Surgical History:  Procedure Laterality Date  . APPENDECTOMY    . COLONOSCOPY    . POLYPECTOMY    . UPPER GASTROINTESTINAL ENDOSCOPY      There were no vitals filed for this visit.  Subjective Assessment - 06/11/19 1212    Subjective  Angel [name] reporting he has not been feeling significant pain over this past week however not performing as much copying at computer.    Patient Stated Goals  "To determine if I need surgery. To relieve pain from my neck and get rid of the headaches w/o meds."    Currently in Pain?  No/denies    Pain Score  0-No pain    Multiple Pain Sites  No                       [facility] Adult PT Treatment/Exercise - 06/11/19  0001      Neck Exercises: Machines for Strengthening   UBE (Upper Arm Bike)  Lvl 2.0, 3 min forward/3 min backwards       Neck Exercises: Theraband   Rows  15 reps;Red   standing    Rows Limitations  cues to avoid excessive lumbar lordosis and for scapular depression     Shoulder External Rotation  10 reps    Shoulder External Rotation Limitations  yellow TB; seated on chair with pool noodle   consistent cueing required for proper motion    Horizontal ABduction  15 reps;Other (comment)   yellow TB   Horizontal ABduction Limitations  seated in chair with pool noodle      Neck Exercises: Seated   Neck Retraction  15 reps;5 secs    Neck Retraction Limitations  + scap. retraction seated in chair with pool noodle and constant cueing from therapist for proper motion       Neck Exercises: Supine   Other Supine Exercise  Hooklying horizontal abduction with red TB 5" x 15 rpes       Modalities  Modalities  Moist Heat      Moist Heat Therapy   Number Minutes Moist Heat  10 Minutes    Moist Heat Location  Cervical   cervical and upper back      Manual Therapy   Manual Therapy  Soft tissue mobilization;Myofascial release    Manual therapy comments  seated     Soft tissue mobilization  DTM to B UT, LS, rhomboids     Myofascial Release  TPR to R/L UT      Neck Exercises: Stretches   Upper Trapezius Stretch  Right;Left;30 seconds;1 rep    Upper Trapezius Stretch Limitations  hands anchored on chair     Levator Stretch  Right;Left;30 seconds;1 rep    Levator Stretch Limitations  hands anchored on chair     Chest Stretch  2 reps;60 seconds    Chest Stretch Limitations  Hooklying on table; in "cross position"               PT Short Term Goals - 06/11/19 1216      PT SHORT TERM GOAL #1   Title  Patient to be independent with initial HEP.    Status  Achieved    Target Date  06/16/19      PT SHORT TERM GOAL #2   Title  Patient will verbalize/demonstrate understanding of  neutral spine posture and proper body mechanics to reduce strain on cervical spine.    Status  Achieved   Verbalizing improved awareness of need for retracted posture   Target Date  06/16/19        PT Long Term Goals - 06/02/19 1412      PT LONG TERM GOAL #1   Title  Patient to be independent with advanced/ongoing HEP.    Status  On-going      PT LONG TERM GOAL #2   Title  Patient will verbalize/demonstrate good awareness of neutral spine posture and proper body mechanics for daily tasks including computer usage.    Status  On-going      PT LONG TERM GOAL #3   Title  Patient to improve cervical AROM to WNL without pain provocation.    Status  On-going      PT LONG TERM GOAL #4   Title  Patient to report pain reduction in frequency and intensity of headaches by >/= 50%.    Status  On-going      PT LONG TERM GOAL #5   Title  Patient will be able to resume walking for exercise w/o exacerbation of neck pain or headaches.    Status  On-going            Plan - 06/11/19 1211    Clinical Impression Statement  Angel [name] reporting no issues with latest HEP update.  Did require heavy cueing with red TB row for scapular depression and abdom. Bracing to avoid excessive lumbar lordosis.  Progressed scapular/postural strengthening activities today with pt. requiring cueing throughout for proper technique and retracted posture.  Pt. noting benefit  from manual massage to B upper shoulder musculature last visit thus continued this today with good relief noted.  Pt. reporting he has not been feeling significant pain over this past week however does note that he has not been spending as much time at the computer copying down papers.  Ended visit today with trial of moist heat to cervical and upper back musculature for relaxation.  Will continue to progress toward goals.    Comorbidities  bipolar disorder,  hepatitis B, hepatic cirrhosis, GERD, h/o falls    Rehab Potential  Good    PT  Treatment/Interventions  ADLs/Self Care Home Management;Cryotherapy;Electrical Stimulation;Moist Heat;Traction;Ultrasound;Functional mobility training;Therapeutic activities;Therapeutic exercise;Balance training;Neuromuscular re-education;Patient/family education;Manual techniques;Passive range of motion;Dry needling;Taping;Spinal Manipulations;Joint Manipulations    PT Next Visit Plan  Posture and body mechanics educationprn; postural strengthening and anterior chest stretching;  manual therapy including possible DN; modalities PRN    PT Home Exercise Plan  06/02/19 - chin tuck, UT, LS stretch, doorway stretch; 06/04/19 - scapular retraction; 06/119 - row with red TB    Consulted and Agree with Plan of Care  Patient       Patient will benefit from skilled therapeutic intervention in order to improve the following deficits and impairments:  Decreased activity tolerance, Decreased balance, Decreased endurance, Decreased knowledge of precautions, Decreased mobility, Decreased range of motion, Decreased strength, Difficulty walking, Increased fascial restricitons, Increased muscle spasms, Impaired perceived functional ability, Impaired flexibility, Impaired UE functional use, Improper body mechanics, Postural dysfunction, Pain  Visit Diagnosis: Occipital neuralgia of left side  Cervicalgia  Abnormal posture  Other symptoms and signs involving the musculoskeletal system     Problem List Patient Active Problem List   Diagnosis Date Noted  . Occipital neuralgia of left side 04/10/2019  . Hyperglycemia 10/07/2013  . Hepatic cirrhosis ([place]) 10/03/2013  . Bipolar I disorder, most recent episode (or current) depressed, unspecified 05/21/2013  . Hepatitis B 03/21/2013  . Esophageal reflux 03/21/2013  . Personal history of colonic polyps 03/21/2013  . Bipolar disorder, unspecified ([place]) 09/03/2011    Angel [name], PTA 06/11/19 12:17 PM   [facility] [address]  [place], [place], [address] Phone: [phone]   Fax:  [phone]  Name: Angel [name] MRN: [id number] Date of Birth: 1955-07-26

## 2019-06-16 ENCOUNTER — Encounter: Payer: Self-pay | Admitting: Physical Therapy

## 2019-06-16 ENCOUNTER — Ambulatory Visit: Payer: BC Managed Care – PPO | Admitting: Physical Therapy

## 2019-06-16 ENCOUNTER — Other Ambulatory Visit: Payer: Self-pay

## 2019-06-16 DIAGNOSIS — R29898 Other symptoms and signs involving the musculoskeletal system: Secondary | ICD-10-CM

## 2019-06-16 DIAGNOSIS — R293 Abnormal posture: Secondary | ICD-10-CM | POA: Diagnosis not present

## 2019-06-16 DIAGNOSIS — R2689 Other abnormalities of gait and mobility: Secondary | ICD-10-CM | POA: Diagnosis not present

## 2019-06-16 DIAGNOSIS — M5481 Occipital neuralgia: Secondary | ICD-10-CM

## 2019-06-16 DIAGNOSIS — M542 Cervicalgia: Secondary | ICD-10-CM | POA: Diagnosis not present

## 2019-06-16 DIAGNOSIS — M6281 Muscle weakness (generalized): Secondary | ICD-10-CM | POA: Diagnosis not present

## 2019-06-16 DIAGNOSIS — R2681 Unsteadiness on feet: Secondary | ICD-10-CM | POA: Diagnosis not present

## 2019-06-16 NOTE — Therapy (Signed)
[facility] [address]  [place], [place], [address] Phone: [phone]   Fax:  [phone]  Physical Therapy Treatment  Patient Details  Name: Angel [name] MRN: [id number] Date of Birth: [birth year] Referring Provider (PT): Sarina Ill, MD   Encounter Date: 06/16/2019  PT End of Session - 06/16/19 1114    Visit Number  6    Number of Visits  12    Date for PT Re-Evaluation  07/07/19    Authorization Type  BCBS    PT Start Time  1114   Pt arrived late   PT Stop Time  1149    PT Time Calculation (min)  35 min    Activity Tolerance  Patient tolerated treatment well    Behavior During Therapy  [facility] for tasks assessed/performed       Past Medical History:  Diagnosis Date  . Bipolar disorder ([place])   . 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 Depression   . GERD (gastroesophageal reflux disease)   . Headache   . Hepatitis B    Hepatitis E. antigen positive by history, status post treatment with Hepsera and baraclude   . Hepatitis B carrier ([place])   . Personal history of colonic polyps    Adenomas polyp 2008 and 2010, 2015   . Weight loss    Pt. has lost 10 lbs since pre-visit, intentionally with diet and walking    Past Surgical History:  Procedure Laterality Date  . APPENDECTOMY    . COLONOSCOPY    . POLYPECTOMY    . UPPER GASTROINTESTINAL ENDOSCOPY      There were no vitals filed for this visit.  Subjective Assessment - 06/16/19 1117    Subjective  Pt denies pain today but still noting increased tigthness and would like to try DN again today.    Patient Stated Goals  "To determine if I need surgery. To relieve pain from my neck and get rid of the headaches w/o meds."                       [facility] Adult PT Treatment/Exercise - 06/16/19 1114      Self-Care   Self-Care  Posture    Posture  Reviewed sleeping posture/positions clarifying appropriate number and density of  pillows to maintain neutral spinal alignment - pt currently reporting he uses 3 pillows (1 firm and 2 soft) for sleeping, therefore encouraged pt to wean one at a time to try to eventually achieve desired neutral alignment.      Exercises   Exercises  Neck      Neck Exercises: Machines for Strengthening   UBE (Upper Arm Bike)  L2.0 x 6 min (3' fwd/3' back)      Manual Therapy   Manual Therapy  Soft tissue mobilization;Myofascial release    Manual therapy comments  prone    Soft tissue mobilization  STM/DTM to B UT, LS, gentle STM to B cervical paraspinals     Myofascial Release  manual TPR to B UT      Neck Exercises: Stretches   Upper Trapezius Stretch  Right;Left;30 seconds    Upper Trapezius Stretch Limitations  cues for hand placement and strap assistance to avoid elevating shoulder       Trigger Point Dry Needling - 06/16/19 1114    Consent Given?  Yes    Muscles Treated Head and Neck  Upper trapezius   bilateral  Upper Trapezius Response  Twitch reponse elicited;Palpable increased muscle length   stronger on L than R            PT Short Term Goals - 06/11/19 1216      PT SHORT TERM GOAL #1   Title  Patient to be independent with initial HEP.    Status  Achieved    Target Date  06/16/19      PT SHORT TERM GOAL #2   Title  Patient will verbalize/demonstrate understanding of neutral spine posture and proper body mechanics to reduce strain on cervical spine.    Status  Achieved   Verbalizing improved awareness of need for retracted posture   Target Date  06/16/19        PT Long Term Goals - 06/02/19 1412      PT LONG TERM GOAL #1   Title  Patient to be independent with advanced/ongoing HEP.    Status  On-going      PT LONG TERM GOAL #2   Title  Patient will verbalize/demonstrate good awareness of neutral spine posture and proper body mechanics for daily tasks including computer usage.    Status  On-going      PT LONG TERM GOAL #3   Title  Patient to  improve cervical AROM to WNL without pain provocation.    Status  On-going      PT LONG TERM GOAL #4   Title  Patient to report pain reduction in frequency and intensity of headaches by >/= 50%.    Status  On-going      PT LONG TERM GOAL #5   Title  Patient will be able to resume walking for exercise w/o exacerbation of neck pain or headaches.    Status  On-going            Plan - 06/16/19 1119    Clinical Impression Statement  Angel [name] requesting DN again today noting significant reduction in pain and muscle tension following last session including DN. Increased tension and taut bands persist in B UT, L>R although patient reporting R more symptomatic than L. Addressed B upper shoulders manual therapy including DN with positive twitch responses elicited (L>R) and significant palpable reduction in muscle tension noted following manual therapy. Reviewed proper sleeping posture/alignment to reduce cervical strain as patient reporting he continues to sleep with 3 pillows (1 firm and 2 soft), therefore encouraged patient to wean pillows one at a time every few days to try to eventually achieve desired neutral alignment. Treatment time limited due to patient's late arrival.    Comorbidities  bipolar disorder, hepatitis B, hepatic cirrhosis, GERD, h/o falls    Rehab Potential  Good    PT Frequency  2x / week    PT Duration  6 weeks    PT Treatment/Interventions  ADLs/Self Care Home Management;Cryotherapy;Electrical Stimulation;Moist Heat;Traction;Ultrasound;Functional mobility training;Therapeutic activities;Therapeutic exercise;Balance training;Neuromuscular re-education;Patient/family education;Manual techniques;Passive range of motion;Dry needling;Taping;Spinal Manipulations;Joint Manipulations    PT Next Visit Plan  Posture and body mechanics educationprn; postural strengthening and anterior chest stretching; manual therapy including possible DN; modalities PRN    PT Home Exercise Plan  06/02/19 -  chin tuck, UT, LS stretch, doorway stretch; 06/04/19 - scapular retraction; 06/09/19 - row with red TB    Consulted and Agree with Plan of Care  Patient       Patient will benefit from skilled therapeutic intervention in order to improve the following deficits and impairments:  Decreased activity tolerance, Decreased balance, Decreased endurance, Decreased  knowledge of precautions, Decreased mobility, Decreased range of motion, Decreased strength, Difficulty walking, Increased fascial restricitons, Increased muscle spasms, Impaired perceived functional ability, Impaired flexibility, Impaired UE functional use, Improper body mechanics, Postural dysfunction, Pain  Visit Diagnosis: Occipital neuralgia of left side  Cervicalgia  Abnormal posture  Other symptoms and signs involving the musculoskeletal system     Problem List Patient Active Problem List   Diagnosis Date Noted  . Occipital neuralgia of left side 04/10/2019  . Hyperglycemia 10/07/2013  . Hepatic cirrhosis ([place]) 10/03/2013  . Bipolar I disorder, most recent episode (or current) depressed, unspecified 05/21/2013  . Hepatitis B 03/21/2013  . Esophageal reflux 03/21/2013  . Personal history of colonic polyps 03/21/2013  . Bipolar disorder, unspecified ([place]) 09/03/2011    Percival Spanish, PT, MPT 06/16/2019, 12:15 PM  [facility] [address]  [place], [place], [address] Phone: [phone]   Fax:  [phone]  Name: Angel [name] MRN: [id number] Date of Birth: 08-28-1955

## 2019-06-18 ENCOUNTER — Ambulatory Visit (HOSPITAL_COMMUNITY): Payer: BC Managed Care – PPO | Admitting: Psychiatry

## 2019-06-18 ENCOUNTER — Encounter: Payer: Self-pay | Admitting: Physical Therapy

## 2019-06-18 ENCOUNTER — Ambulatory Visit: Payer: BC Managed Care – PPO | Admitting: Physical Therapy

## 2019-06-18 ENCOUNTER — Other Ambulatory Visit: Payer: Self-pay

## 2019-06-18 DIAGNOSIS — M5481 Occipital neuralgia: Secondary | ICD-10-CM

## 2019-06-18 DIAGNOSIS — M542 Cervicalgia: Secondary | ICD-10-CM

## 2019-06-18 DIAGNOSIS — R29898 Other symptoms and signs involving the musculoskeletal system: Secondary | ICD-10-CM

## 2019-06-18 DIAGNOSIS — R293 Abnormal posture: Secondary | ICD-10-CM

## 2019-06-18 NOTE — Therapy (Signed)
[facility] [place] [facility] [address]  [place], [place], [address] Phone: [phone]   Fax:  [phone]  Physical Therapy Treatment  Patient Details  Name: Angel [name] MRN: [id number] Date of Birth: [birth year] Referring Provider (PT): Sarina Ill, MD   Encounter Date: 06/18/2019  PT End of Session - 06/18/19 1102    Visit Number  7    Number of Visits  12    Date for PT Re-Evaluation  07/07/19    Authorization Type  BCBS    PT Start Time  1102    PT Stop Time  1145    PT Time Calculation (min)  43 min    Activity Tolerance  Patient tolerated treatment well    Behavior During Therapy  [facility] for tasks assessed/performed       Past Medical History:  Diagnosis Date  . Bipolar disorder ([name])   . 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 Depression   . GERD (gastroesophageal reflux disease)   . Headache   . Hepatitis B    Hepatitis E. antigen positive by history, status post treatment with Hepsera and baraclude   . Hepatitis B carrier ([place])   . Personal history of colonic polyps    Adenomas polyp 2008 and 2010, 2015   . Weight loss    Pt. has lost 10 lbs since pre-visit, intentionally with diet and walking    Past Surgical History:  Procedure Laterality Date  . APPENDECTOMY    . COLONOSCOPY    . POLYPECTOMY    . UPPER GASTROINTESTINAL ENDOSCOPY      There were no vitals filed for this visit.  Subjective Assessment - 06/18/19 1106    Subjective  Pt continues to feel like DN has really helped him.    Patient Stated Goals  "To determine if I need surgery. To relieve pain from my neck and get rid of the headaches w/o meds."    Currently in Pain?  Yes    Pain Score  1     Pain Location  Neck    Pain Orientation  Lower    Pain Descriptors / Indicators  Dull    Pain Type  Chronic pain    Pain Frequency  Intermittent         [facility] PT Assessment - 06/18/19 1102      Assessment   Medical Diagnosis  L  cervical myofascial pain & occipital neuralgia    Referring Provider (PT)  Sarina Ill, MD    Onset Date/Surgical Date  --   6 months   Hand Dominance  Right    Next MD Visit  TBD      AROM   Cervical Flexion  48    Cervical Extension  40    Cervical - Right Side Bend  28   pain on L side of neck   Cervical - Left Side Bend  25    Cervical - Right Rotation  48    Cervical - Left Rotation  50                   [facility] Adult PT Treatment/Exercise - 06/18/19 1102      Exercises   Exercises  Neck      Neck Exercises: Machines for Strengthening   UBE (Upper Arm Bike)  L2.0 x 6 min (3' fwd/3' back)      Neck Exercises: Theraband   Rows  15 reps;Green  Rows Limitations  cues for staggered stance to better recruit abdominal muscles to avoid excessive lumbar lordosis; cues for scap retraction & depression keeping arms at side with forearms parallel to floor and avoiding excessive shoulder extension or upward rotation of elbows    Shoulder External Rotation  10 reps   yellow TB   Shoulder External Rotation Limitations  standing with back against door jamb for cues for scap retraction    Horizontal ABduction  10 reps   yellow TB   Horizontal ABduction Limitations  standing with back against door jamb for cues for scap retraction    Other Theraband Exercises  Alt UE diagonals with yellow TB x 10; standing with back against door jamb for cues for scap retraction             PT Education - 06/18/19 1144    Education Details  HEP update - yellow TB scap retraction + B horiz ABD, B shoulder ER & alt UE diagonals; progression to green TB with rows (with cues for correction of exercise technique)    Person(s) Educated  Patient    Methods  Explanation;Demonstration;Verbal cues;Tactile cues;Handout    Comprehension  Verbalized understanding;Returned demonstration;Verbal cues required;Tactile cues required;Need further instruction       PT Short Term Goals - 06/18/19 1110       PT SHORT TERM GOAL #1   Title  Patient to be independent with initial HEP.    Status  Achieved   06/09/19     PT SHORT TERM GOAL #2   Title  Patient will verbalize/demonstrate understanding of neutral spine posture and proper body mechanics to reduce strain on cervical spine.    Status  Achieved   06/11/19       PT Long Term Goals - 06/18/19 1110      PT LONG TERM GOAL #1   Title  Patient to be independent with advanced/ongoing HEP.    Status  Partially Met    Target Date  07/07/19      PT LONG TERM GOAL #2   Title  Patient will verbalize/demonstrate good awareness of neutral spine posture and proper body mechanics for daily tasks including computer usage.    Status  Partially Met    Target Date  07/07/19      PT LONG TERM GOAL #3   Title  Patient to improve cervical AROM to WNL without pain provocation.    Status  Partially Met    Target Date  07/07/19      PT LONG TERM GOAL #4   Title  Patient to report pain reduction in frequency and intensity of headaches by >/= 50%.    Status  Achieved   06/18/19 - reporting 90% reduction in headaches   Target Date  07/07/19      PT LONG TERM GOAL #5   Title  Patient will be able to resume walking for exercise w/o exacerbation of neck pain or headaches.    Status  On-going    Target Date  07/07/19            Plan - 06/18/19 1145    Clinical Impression Statement  Angel [name] reporting good progress with PT thus far, noting particular benefit from DN. Neck pain has been no more than 1/10 for past week or so and headache frequency and intensity has been reduced by 90%, with only recent headache more likely due to skipping meal as headache resolved once he ate. He reports improving awareness of his  posture, although does continue to require intermittent cues during exercises for appropriate alignment. Cervical ROM gradually improving with motions now more symmetrical L vs R and pain/discomfort only noted on L side of neck with R side  bend. He reports good compliance with HEP and was able to tolerate progression of TB resistance to green with rows (although significant cueing still necessary for proper alignment and technique) as well as addition of scapular retraction and posterior shoulder strengthening for improved postural control using yellow TB with door jamb utilized for tactile feedback for neutral posture and scapular activation. All STGs achieved and he is progressing well toward his LTGs with LTG #4 achieved and remaining LTGs partially met with exception of LTG #5 still ongoing as patient has not yet attempted to resume walking for exercise due to cold weather.    Comorbidities  bipolar disorder, hepatitis B, hepatic cirrhosis, GERD, h/o falls    Rehab Potential  Good    PT Frequency  2x / week    PT Duration  6 weeks    PT Treatment/Interventions  ADLs/Self Care Home Management;Cryotherapy;Electrical Stimulation;Moist Heat;Traction;Ultrasound;Functional mobility training;Therapeutic activities;Therapeutic exercise;Balance training;Neuromuscular re-education;Patient/family education;Manual techniques;Passive range of motion;Dry needling;Taping;Spinal Manipulations;Joint Manipulations    PT Next Visit Plan  postural strengthening and anterior chest stretching; manual therapy including DN as indicated and continued benefit noted; modalities PRN; posture and body mechanics education PRN    PT Home Exercise Plan  06/02/19 - chin tuck, UT, LS stretch, doorway stretch; 06/04/19 - scapular retraction; 06/09/19 - row with red TB (progressed to green TB 06/18/19); 06/18/19 - yellow TB scap retraction + B horiz ABD, B shoulder ER & alt UE diagonals    Consulted and Agree with Plan of Care  Patient       Patient will benefit from skilled therapeutic intervention in order to improve the following deficits and impairments:  Decreased activity tolerance, Decreased balance, Decreased endurance, Decreased knowledge of precautions, Decreased  mobility, Decreased range of motion, Decreased strength, Difficulty walking, Increased fascial restricitons, Increased muscle spasms, Impaired perceived functional ability, Impaired flexibility, Impaired UE functional use, Improper body mechanics, Postural dysfunction, Pain  Visit Diagnosis: Occipital neuralgia of left side  Cervicalgia  Abnormal posture  Other symptoms and signs involving the musculoskeletal system     Problem List Patient Active Problem List   Diagnosis Date Noted  . Occipital neuralgia of left side 04/10/2019  . Hyperglycemia 10/07/2013  . Hepatic cirrhosis ([place]) 10/03/2013  . Bipolar I disorder, most recent episode (or current) depressed, unspecified 05/21/2013  . Hepatitis B 03/21/2013  . Esophageal reflux 03/21/2013  . Personal history of colonic polyps 03/21/2013  . Bipolar disorder, unspecified ([place]) 09/03/2011    Percival Spanish, PT, MPT 06/18/2019, 12:29 PM  [facility] [address]  [place], [place], [address] Phone: [phone]   Fax:  [phone]  Name: Angel [name] MRN: [id number] Date of Birth: August 26, 1955

## 2019-06-18 NOTE — Patient Instructions (Signed)
    Home exercise program created by Jermaine Tholl, PT.  For questions, please contact Jaquarious Grey via phone at [phone] or email at [email or web address]  [facility] [place] [address]  [address] [place], [place], [address] Phone: [phone]   Fax:  [phone]    

## 2019-06-23 ENCOUNTER — Ambulatory Visit: Payer: BC Managed Care – PPO

## 2019-06-23 ENCOUNTER — Other Ambulatory Visit: Payer: Self-pay

## 2019-06-23 DIAGNOSIS — M5481 Occipital neuralgia: Secondary | ICD-10-CM | POA: Diagnosis not present

## 2019-06-23 DIAGNOSIS — R293 Abnormal posture: Secondary | ICD-10-CM | POA: Diagnosis not present

## 2019-06-23 DIAGNOSIS — M542 Cervicalgia: Secondary | ICD-10-CM

## 2019-06-23 DIAGNOSIS — R29898 Other symptoms and signs involving the musculoskeletal system: Secondary | ICD-10-CM

## 2019-06-23 DIAGNOSIS — R2681 Unsteadiness on feet: Secondary | ICD-10-CM | POA: Diagnosis not present

## 2019-06-23 DIAGNOSIS — R2689 Other abnormalities of gait and mobility: Secondary | ICD-10-CM | POA: Diagnosis not present

## 2019-06-23 DIAGNOSIS — M6281 Muscle weakness (generalized): Secondary | ICD-10-CM | POA: Diagnosis not present

## 2019-06-23 NOTE — Therapy (Signed)
[facility] [address]  [place], [place], [address] Phone: [phone]   Fax:  [phone]  Physical Therapy Treatment  Patient Details  Name: Angel [name] MRN: [id number] Date of Birth: Nov 21, 1955 Referring Provider (PT): Sarina Ill, MD   Encounter Date: 06/23/2019  PT End of Session - 06/23/19 1112    Visit Number  8    Number of Visits  12    Date for PT Re-Evaluation  07/07/19    Authorization Type  BCBS    PT Start Time  1105    PT Stop Time  1150    PT Time Calculation (min)  45 min    Activity Tolerance  Patient tolerated treatment well    Behavior During Therapy  [facility] for tasks assessed/performed       Past Medical History:  Diagnosis Date  . Bipolar disorder ([place])   . 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 Depression   . GERD (gastroesophageal reflux disease)   . Headache   . Hepatitis B    Hepatitis E. antigen positive by history, status post treatment with Hepsera and baraclude   . Hepatitis B carrier ([place])   . Personal history of colonic polyps    Adenomas polyp 2008 and 2010, 2015   . Weight loss    Pt. has lost 10 lbs since pre-visit, intentionally with diet and walking    Past Surgical History:  Procedure Laterality Date  . APPENDECTOMY    . COLONOSCOPY    . POLYPECTOMY    . UPPER GASTROINTESTINAL ENDOSCOPY      There were no vitals filed for this visit.  Subjective Assessment - 06/23/19 1112    Subjective  Pt. noting benefit from last DN session.    Patient Stated Goals  "To determine if I need surgery. To relieve pain from my neck and get rid of the headaches w/o meds."    Currently in Pain?  No/denies    Pain Score  0-No pain    Multiple Pain Sites  No                       [facility] Adult PT Treatment/Exercise - 06/23/19 0001      Self-Care   Self-Care  Posture    Posture  Further reviewed sleeping posture (as pt. is a side sleeper and notes  he is still sleeping with 3 pillows); therapist demonstrating positioning with three pillows and pt. verbalizing understanding of how three pillows forces cervical spine out of neutral alignment; pt. reporting he will try sleeping with two pillows       Neck Exercises: Machines for Strengthening   UBE (Upper Arm Bike)  --      Neck Exercises: Theraband   Rows  15 reps;Green   3" hold    Rows Limitations  cues for staggered stance to better recruit abdominal muscles to avoid excessive lumbar lordosis; cues for scap retraction & depression keeping arms at side with forearms parallel to floor and avoiding excessive shoulder extension or upward rotation of elbows    Shoulder External Rotation  10 reps   yellow TB; 3" hold    Shoulder External Rotation Limitations  back on wall     Horizontal ABduction  15 reps   yellow TB; 3" hold    Horizontal ABduction Limitations  back on wall     Other Theraband Exercises  Alternating Yellow TB flexion/ext. x 15  reps leaning on doorseal       Manual Therapy   Manual Therapy  Passive ROM    Manual therapy comments  supine     Passive ROM  Manual B UT, LS, scalanes stretch with therapist x 30 sec each way       Neck Exercises: Stretches   Upper Trapezius Stretch  Right;Left;30 seconds    Upper Trapezius Stretch Limitations  therapist anchoring opp shoulder down     Levator Stretch  Right;Left;30 seconds;1 rep    Levator Stretch Limitations  therapist anchoring opp shoulder down                PT Short Term Goals - 06/18/19 1110      PT SHORT TERM GOAL #1   Title  Patient to be independent with initial HEP.    Status  Achieved   06/09/19     PT SHORT TERM GOAL #2   Title  Patient will verbalize/demonstrate understanding of neutral spine posture and proper body mechanics to reduce strain on cervical spine.    Status  Achieved   06/11/19       PT Long Term Goals - 06/18/19 1110      PT LONG TERM GOAL #1   Title  Patient to be  independent with advanced/ongoing HEP.    Status  Partially Met    Target Date  07/07/19      PT LONG TERM GOAL #2   Title  Patient will verbalize/demonstrate good awareness of neutral spine posture and proper body mechanics for daily tasks including computer usage.    Status  Partially Met    Target Date  07/07/19      PT LONG TERM GOAL #3   Title  Patient to improve cervical AROM to WNL without pain provocation.    Status  Partially Met    Target Date  07/07/19      PT LONG TERM GOAL #4   Title  Patient to report pain reduction in frequency and intensity of headaches by >/= 50%.    Status  Achieved   06/18/19 - reporting 90% reduction in headaches   Target Date  07/07/19      PT LONG TERM GOAL #5   Title  Patient will be able to resume walking for exercise w/o exacerbation of neck pain or headaches.    Status  On-going    Target Date  07/07/19            Plan - 06/23/19 1148    Clinical Impression Statement  Angel [name] reporting benefit from DN in previous visits.  Notes his pain has improved significantly since starting physical therapy.  Further reviewed need for neutral cervical alignment in side sleeping (as pt. is a side sleeper) with demonstration for patient understanding.  Pt. verbalizing plans to decreased from 3 pillows>2 pillows to reduce cervical strain.  Reviewed latest HEP update with minor correction made with yellow TB "no money" exercise to maintain elbows by trunk position.  Pt. tolerated all activities in session well.  Pt. encouraged to attempt walking more frequently for exercise and he notes he will attempt this and pay attention to if this irritates his neck and shoulder pain.  Progressing toward LTG #5.    Comorbidities  bipolar disorder, hepatitis B, hepatic cirrhosis, GERD, h/o falls    PT Treatment/Interventions  ADLs/Self Care Home Management;Cryotherapy;Electrical Stimulation;Moist Heat;Traction;Ultrasound;Functional mobility training;Therapeutic  activities;Therapeutic exercise;Balance training;Neuromuscular re-education;Patient/family education;Manual techniques;Passive range of motion;Dry needling;Taping;Spinal Manipulations;Joint Manipulations  PT Next Visit Plan  postural strengthening and anterior chest stretching; manual therapy including DN as indicated and continued benefit noted; modalities PRN; posture and body mechanics education PRN    PT Home Exercise Plan  06/02/19 - chin tuck, UT, LS stretch, doorway stretch; 06/04/19 - scapular retraction; 06/09/19 - row with red TB (progressed to green TB 06/18/19); 06/18/19 - yellow TB scap retraction + B horiz ABD, B shoulder ER & alt UE diagonals    Consulted and Agree with Plan of Care  Patient       Patient will benefit from skilled therapeutic intervention in order to improve the following deficits and impairments:  Decreased activity tolerance, Decreased balance, Decreased endurance, Decreased knowledge of precautions, Decreased mobility, Decreased range of motion, Decreased strength, Difficulty walking, Increased fascial restricitons, Increased muscle spasms, Impaired perceived functional ability, Impaired flexibility, Impaired UE functional use, Improper body mechanics, Postural dysfunction, Pain  Visit Diagnosis: Occipital neuralgia of left side  Cervicalgia  Abnormal posture  Other symptoms and signs involving the musculoskeletal system     Problem List Patient Active Problem List   Diagnosis Date Noted  . Occipital neuralgia of left side 04/10/2019  . Hyperglycemia 10/07/2013  . Hepatic cirrhosis ([place]) 10/03/2013  . Bipolar I disorder, most recent episode (or current) depressed, unspecified 05/21/2013  . Hepatitis B 03/21/2013  . Esophageal reflux 03/21/2013  . Personal history of colonic polyps 03/21/2013  . Bipolar disorder, unspecified ([place]) 09/03/2011    Bess Harvest, PTA 06/23/19 12:11 PM   [facility] [address]  [place], [place], [address] Phone: [phone]   Fax:  [phone]  Name: Angel [name] MRN: [id number] Date of Birth: 02/22/1956

## 2019-06-25 ENCOUNTER — Ambulatory Visit: Payer: BC Managed Care – PPO | Admitting: Physical Therapy

## 2019-06-25 ENCOUNTER — Other Ambulatory Visit: Payer: Self-pay

## 2019-06-25 ENCOUNTER — Encounter: Payer: Self-pay | Admitting: Physical Therapy

## 2019-06-25 DIAGNOSIS — R2681 Unsteadiness on feet: Secondary | ICD-10-CM | POA: Diagnosis not present

## 2019-06-25 DIAGNOSIS — R293 Abnormal posture: Secondary | ICD-10-CM | POA: Diagnosis not present

## 2019-06-25 DIAGNOSIS — R2689 Other abnormalities of gait and mobility: Secondary | ICD-10-CM | POA: Diagnosis not present

## 2019-06-25 DIAGNOSIS — M542 Cervicalgia: Secondary | ICD-10-CM

## 2019-06-25 DIAGNOSIS — M5481 Occipital neuralgia: Secondary | ICD-10-CM | POA: Diagnosis not present

## 2019-06-25 DIAGNOSIS — R29898 Other symptoms and signs involving the musculoskeletal system: Secondary | ICD-10-CM | POA: Diagnosis not present

## 2019-06-25 DIAGNOSIS — M6281 Muscle weakness (generalized): Secondary | ICD-10-CM | POA: Diagnosis not present

## 2019-06-25 NOTE — Therapy (Addendum)
[facility] [place] [facility] [address]  [place], [place], [address] Phone: [phone]   Fax:  [phone]  Physical Therapy Treatment  Patient Details  Name: Angel [name] MRN: [id number] Date of Birth: 02-Mar-1956 Referring Provider (PT): Sarina Ill, MD   Encounter Date: 06/25/2019  PT End of Session - 06/25/19 1106    Visit Number  9    Number of Visits  12    Date for PT Re-Evaluation  07/07/19    Authorization Type  BCBS    PT Start Time  1106    PT Stop Time  1148    PT Time Calculation (min)  42 min    Activity Tolerance  Patient tolerated treatment well    Behavior During Therapy  [facility] for tasks assessed/performed       Past Medical History:  Diagnosis Date  . Bipolar disorder ([employer])   . 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 Depression   . GERD (gastroesophageal reflux disease)   . Headache   . Hepatitis B    Hepatitis E. antigen positive by history, status post treatment with Hepsera and baraclude   . Hepatitis B carrier ([place])   . Personal history of colonic polyps    Adenomas polyp 2008 and 2010, 2015   . Weight loss    Pt. has lost 10 lbs since pre-visit, intentionally with diet and walking    Past Surgical History:  Procedure Laterality Date  . APPENDECTOMY    . COLONOSCOPY    . POLYPECTOMY    . UPPER GASTROINTESTINAL ENDOSCOPY      There were no vitals filed for this visit.  Subjective Assessment - 06/25/19 1110    Subjective  Pt noting increased neck stiffness today but no pain - not sure if it is weather related or from working on weaning the number of pillows he sleeps on (now down to 2 pillows and no neck pillow).    Patient Stated Goals  "To determine if I need surgery. To relieve pain from my neck and get rid of the headaches w/o meds."    Currently in Pain?  No/denies                       [facility] Adult PT Treatment/Exercise - 06/25/19 1107      Neck Exercises:  Machines for Strengthening   UBE (Upper Arm Bike)  L2.5 x 6 min (3' fwd/3' back)      Manual Therapy   Manual Therapy  Soft tissue mobilization;Myofascial release;Passive ROM;Manual Traction    Manual therapy comments  prone & supine     Soft tissue mobilization  STM/DTM to B UT (L>R); gentle STM to B cervical paraspinals (R>L)    Myofascial Release  manual TPR and pin & stretch to B UT    Passive ROM  gentle cervical PROM in all directions with manual UT and scalene stretches    Manual Traction  gentle manual distraction 3 x 30 sec       Trigger Point Dry Needling - 06/25/19 1107    Consent Given?  Yes    Muscles Treated Head and Neck  Upper trapezius   bilateral   Upper Trapezius Response  Twitch reponse elicited;Palpable increased muscle length   stronger on L than R            PT Short Term Goals - 06/18/19 1110      PT SHORT TERM  GOAL #1   Title  Patient to be independent with initial HEP.    Status  Achieved   06/09/19     PT SHORT TERM GOAL #2   Title  Patient will verbalize/demonstrate understanding of neutral spine posture and proper body mechanics to reduce strain on cervical spine.    Status  Achieved   06/11/19       PT Long Term Goals - 06/18/19 1110      PT LONG TERM GOAL #1   Title  Patient to be independent with advanced/ongoing HEP.    Status  Partially Met    Target Date  07/07/19      PT LONG TERM GOAL #2   Title  Patient will verbalize/demonstrate good awareness of neutral spine posture and proper body mechanics for daily tasks including computer usage.    Status  Partially Met    Target Date  07/07/19      PT LONG TERM GOAL #3   Title  Patient to improve cervical AROM to WNL without pain provocation.    Status  Partially Met    Target Date  07/07/19      PT LONG TERM GOAL #4   Title  Patient to report pain reduction in frequency and intensity of headaches by >/= 50%.    Status  Achieved   06/18/19 - reporting 90% reduction in headaches    Target Date  07/07/19      PT LONG TERM GOAL #5   Title  Patient will be able to resume walking for exercise w/o exacerbation of neck pain or headaches.    Status  On-going    Target Date  07/07/19            Plan - 06/25/19 1114    Clinical Impression Statement  Angel [name] reporting no pain but increased stiffness today which he wonders may be related to weaning pillows while sleeping, although still propping head up in forward tilt while using tablet - encouraged him to use pillows to support head and torso in neutral alignment and then elevate tablet height to eye level using pillow(s) as needed to reduce forward head strain on neck. Manual therapy incorporating DN utilized to address increased tension in B UTs with strong twitch response elicited on L and palpable reduction in muscle tension noted bilaterally following manual therapy allowing for more neutral and symmetrical shoulder alignment by end of session. Angel [name] will be unable to attend PT next week, therefore will plan for 10th visit PN with recert vs transition to HEP upon his return on 07/07/19.    Personal Factors and Comorbidities  Comorbidity 3+;Time since onset of injury/illness/exacerbation;Fitness;Past/Current Experience    Comorbidities  bipolar disorder, hepatitis B, hepatic cirrhosis, GERD, h/o falls    Examination-Activity Limitations  Locomotion Level;Sleep;Sit    Examination-Participation Restrictions  Community Activity;Driving;Interpersonal Relationship    Rehab Potential  Good    PT Frequency  2x / week    PT Duration  6 weeks    PT Treatment/Interventions  ADLs/Self Care Home Management;Cryotherapy;Electrical Stimulation;Moist Heat;Traction;Ultrasound;Functional mobility training;Therapeutic activities;Therapeutic exercise;Balance training;Neuromuscular re-education;Patient/family education;Manual techniques;Passive range of motion;Dry needling;Taping;Spinal Manipulations;Joint Manipulations    PT Next Visit Plan  [id number]  visit PN/Recert as needed vs transition to HEP; postural strengthening and anterior chest stretching; manual therapy including DN as indicated and continued benefit noted; modalities PRN; posture and body mechanics education PRN    PT Home Exercise Plan  06/02/19 - chin tuck, UT, LS stretch, doorway stretch; 06/04/19 - scapular  retraction; 06/09/19 - row with red TB (progressed to green TB 06/18/19); 06/18/19 - yellow TB scap retraction + B horiz ABD, B shoulder ER & alt UE diagonals    Consulted and Agree with Plan of Care  Patient       Patient will benefit from skilled therapeutic intervention in order to improve the following deficits and impairments:  Decreased activity tolerance, Decreased balance, Decreased endurance, Decreased knowledge of precautions, Decreased mobility, Decreased range of motion, Decreased strength, Difficulty walking, Increased fascial restricitons, Increased muscle spasms, Impaired perceived functional ability, Impaired flexibility, Impaired UE functional use, Improper body mechanics, Postural dysfunction, Pain  Visit Diagnosis: Occipital neuralgia of left side  Cervicalgia  Abnormal posture  Other symptoms and signs involving the musculoskeletal system       Problem List Patient Active Problem List   Diagnosis Date Noted  . Occipital neuralgia of left side 04/10/2019  . Hyperglycemia 10/07/2013  . Hepatic cirrhosis ([place]) 10/03/2013  . Bipolar I disorder, most recent episode (or current) depressed, unspecified 05/21/2013  . Hepatitis B 03/21/2013  . Esophageal reflux 03/21/2013  . Personal history of colonic polyps 03/21/2013  . Bipolar disorder, unspecified ([place]) 09/03/2011    Percival Spanish, PT, MPT 06/25/2019, 2:55 PM  [facility] [address]  [place], [place], [address] Phone: [phone]   Fax:  [phone]  Name: Angel [name] MRN: [id number] Date of Birth: Oct 29, 1955

## 2019-06-27 ENCOUNTER — Other Ambulatory Visit: Payer: Self-pay

## 2019-06-27 ENCOUNTER — Ambulatory Visit (INDEPENDENT_AMBULATORY_CARE_PROVIDER_SITE_OTHER): Payer: BC Managed Care – PPO | Admitting: Psychiatry

## 2019-06-27 DIAGNOSIS — F3110 Bipolar disorder, current episode manic without psychotic features, unspecified: Secondary | ICD-10-CM

## 2019-06-27 DIAGNOSIS — F331 Major depressive disorder, recurrent, moderate: Secondary | ICD-10-CM

## 2019-06-27 MED ORDER — DIVALPROEX SODIUM ER 250 MG PO TB24: ORAL_TABLET | ORAL | 5 refills | Status: DC

## 2019-06-27 MED ORDER — TRAZODONE HCL 50 MG PO TABS: ORAL_TABLET | ORAL | 12 refills | Status: DC

## 2019-06-27 MED ORDER — VENLAFAXINE HCL ER 150 MG PO CP24: 150.0000 mg | ORAL_CAPSULE | Freq: Every morning | ORAL | 1 refills | Status: DC

## 2019-06-27 NOTE — Progress Notes (Signed)
Patient ID: Angel [name], male   DOB: [birth year], 64 y.o.   MRN: [id number] [facility] MD Progress Note  06/27/2019 12:04 PM Angel [name]  MRN:  [id number] Subjective:  Feeling great Principal Problem: Bipolar disorder Past Medical History:   Is an abbreviated visit.  There was a lot of phone confusion.  We only spoke for short period of time as the phone seem to breakdown while we were talking.  Overall the patient seems to be doing well.  He has no overt evidence of bipolar disorder/mania.  His lithium levels 0.8 his Depakote level was low at 32.  We then made an effort to increase his Depakote and he ended up having diarrhea.  We reduced his Depakote back down to 250 mg twice daily.  For now we will leave it where it is.  He will continue taking trazodone 50 mg 3 at night Effexor 150.  Patient overall is sleeping and eating well.  He is not really affected much by the pandemic.  He seems to be in a good mood.  Today we talked to him without Angel [name] his partner.  Past Medical History:  Diagnosis Date  . Bipolar disorder ([place])   . 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 Depression   . GERD (gastroesophageal reflux disease)   . Headache   . Hepatitis B    Hepatitis E. antigen positive by history, status post treatment with Hepsera and baraclude   . Hepatitis B carrier ([place])   . Personal history of colonic polyps    Adenomas polyp 2008 and 2010, 2015   . Weight loss    Pt. has lost 10 lbs since pre-visit, intentionally with diet and walking    Past Surgical History:  Procedure Laterality Date  . APPENDECTOMY    . COLONOSCOPY    . POLYPECTOMY    . UPPER GASTROINTESTINAL ENDOSCOPY     Family History:  Family History  Problem Relation Age of Onset  . Heart disease Mother   . Diabetes Father   . Kidney failure Father   . Kidney disease Father   . Other Sister        GERD  . Colon cancer Neg Hx   . Rectal cancer Neg Hx   . Stomach cancer Neg Hx   . Esophageal cancer Neg Hx    . Headache Neg Hx   . Migraines Neg Hx    Family Psychiatric  History:  Social History:  Social History   Substance and Sexual Activity  Alcohol Use No  . Alcohol/week: 0.0 standard drinks     Social History   Substance and Sexual Activity  Drug Use No    Social History   Socioeconomic History  . Marital status: Married    Spouse name: [name]  . Number of children: 0  . Years of education: Not on file  . Highest education level: Master's degree (e.g., MA, MS, MEng, MEd, MSW, MBA)  Occupational History  . Occupation: retired    Fish farm manager: NOT EMPLOYED  Tobacco Use  . Smoking status: Former Smoker    Types: Cigarettes    Quit date: 05/08/2009    Years since quitting: 10.1  . Smokeless tobacco: Never Used  Substance and Sexual Activity  . Alcohol use: No    Alcohol/week: 0.0 standard drinks  . Drug use: No  . Sexual activity: Not Currently  Other Topics Concern  . Not on file  Social History Narrative   Patient is right-handed. He  is married to same sex partner. He gets little exercise.   Caffeine: 4 glasses of tea max/day   Social Determinants of Health   Financial Resource Strain:   . Difficulty of Paying Living Expenses: Not on file  Food Insecurity:   . Worried About Charity fundraiser in the Last Year: Not on file  . Ran Out of Food in the Last Year: Not on file  Transportation Needs:   . Lack of Transportation (Medical): Not on file  . Lack of Transportation (Non-Medical): Not on file  Physical Activity:   . Days of Exercise per Week: Not on file  . Minutes of Exercise per Session: Not on file  Stress:   . Feeling of Stress : Not on file  Social Connections:   . Frequency of Communication with Friends and Family: Not on file  . Frequency of Social Gatherings with Friends and Family: Not on file  . Attends Religious Services: Not on file  . Active Member of Clubs or Organizations: Not on file  . Attends Archivist Meetings: Not on file   . Marital Status: Not on file   Additional Social History:                         Sleep: Good  Appetite:  Good  Current Medications:   Lab Results:  No results found for this or any previous visit (from the past 48 hour(s)).  Physical Findings: AIMS:  , ,  ,  ,    CIWA:    COWS:     Musculoskeletal: Strength & Muscle Tone: within normal limits Gait & Station: normal Patient leans: N/A  Psychiatric Specialty Exam: Angel [name] is resistant Patient ID: Angel [name], male   DOB: December 21, 1955, 64 y.o.   MRN: [id number] [facility] MD Progress Note  06/27/2019 12:04 PM Angel [name]  MRN:  [id number] Subjective:  Feeling great Principal Problem: Bipolar disorder, most recent episode depression Today the patient is seen with his partner care. [name] patient is doing well. His mood is stable. He shows no signs of mania. His sleep is a little erratic but is not really that big of a problem. He's eating well has good energy enjoying life. He uses no alcohol or drugs. His hepatitis B cirrhosis is stable. Medically he is feeling well. Initially he stable. He's got good relationship with his partner with others around him. There is no evidence of alcohol or drug use in this individual. He takes his medicines just as prescribed. Past Medical History:  Past Medical History:  Diagnosis Date  . Bipolar disorder ([place])   . 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 Depression   . GERD (gastroesophageal reflux disease)   . Headache   . Hepatitis B    Hepatitis E. antigen positive by history, status post treatment with Hepsera and baraclude   . Hepatitis B carrier ([place])   . Personal history of colonic polyps    Adenomas polyp 2008 and 2010, 2015   . Weight loss    Pt. has lost 10 lbs since pre-visit, intentionally with diet and walking    Past Surgical History:  Procedure Laterality Date  . APPENDECTOMY    . COLONOSCOPY    . POLYPECTOMY    . UPPER GASTROINTESTINAL ENDOSCOPY      Family History:  Family History  Problem Relation Age of Onset  . Heart disease Mother   . Diabetes Father   .  Kidney failure Father   . Kidney disease Father   . Other Sister        GERD  . Colon cancer Neg Hx   . Rectal cancer Neg Hx   . Stomach cancer Neg Hx   . Esophageal cancer Neg Hx   . Headache Neg Hx   . Migraines Neg Hx    Family Psychiatric  History:  Social History:  Social History   Substance and Sexual Activity  Alcohol Use No  . Alcohol/week: 0.0 standard drinks     Social History   Substance and Sexual Activity  Drug Use No    Social History   Socioeconomic History  . Marital status: Married    Spouse name: [name]  . Number of children: 0  . Years of education: Not on file  . Highest education level: Master's degree (e.g., MA, MS, MEng, MEd, MSW, MBA)  Occupational History  . Occupation: retired    Fish farm manager: NOT EMPLOYED  Tobacco Use  . Smoking status: Former Smoker    Types: Cigarettes    Quit date: 05/08/2009    Years since quitting: 10.1  . Smokeless tobacco: Never Used  Substance and Sexual Activity  . Alcohol use: No    Alcohol/week: 0.0 standard drinks  . Drug use: No  . Sexual activity: Not Currently  Other Topics Concern  . Not on file  Social History Narrative   Patient is right-handed. He is married to same sex partner. He gets little exercise.   Caffeine: 4 glasses of tea max/day   Social Determinants of Health   Financial Resource Strain:   . Difficulty of Paying Living Expenses: Not on file  Food Insecurity:   . Worried About Charity fundraiser in the Last Year: Not on file  . Ran Out of Food in the Last Year: Not on file  Transportation Needs:   . Lack of Transportation (Medical): Not on file  . Lack of Transportation (Non-Medical): Not on file  Physical Activity:   . Days of Exercise per Week: Not on file  . Minutes of Exercise per Session: Not on file  Stress:   . Feeling of Stress : Not on file  Social  Connections:   . Frequency of Communication with Friends and Family: Not on file  . Frequency of Social Gatherings with Friends and Family: Not on file  . Attends Religious Services: Not on file  . Active Member of Clubs or Organizations: Not on file  . Attends Archivist Meetings: Not on file  . Marital Status: Not on file   Additional Social History:                         Sleep: Good  Appetite:  Good  Current Medications:   Lab Results:  No results found for this or any previous visit (from the past 48 hour(s)).  Physical Findings: AIMS:  , ,  ,  ,    CIWA:    COWS:     Musculoskeletal: Strength & Muscle Tone: within normal limits Gait & Station: normal Patient leans: N/A  Psychiatric Specialty Exam: ROS  There were no vitals taken for this visit.There is no height or weight on file to calculate BMI.  General Appearance: Casual  Eye Contact:: Good   Speech:  Clear and Coherent  Volume:  Normal  Mood:  Euthymic  Affect:  NA and Appropriate  Thought Process:  Coherent  Orientation:  Full (Time, Place, and Person)  Thought Content:  WDL  Suicidal Thoughts:  No  Homicidal Thoughts:  No  Memory:  NA  Judgement:  Good  Insight:  Good  Psychomotor Activity:  Normal  Concentration:  Good  Recall:  Good  Fund of Knowledge:Good  Language: Good  Akathisia:  No  Handed:  Right  AIMS (if indicated):     Assets:  Desire for Improvement  ADL's:  Intact  Cognition: WNL  Sleep:      Treatment Plan Summary: 06/27/2019,    There were no vitals taken for this visit.There is no height or weight on file to calculate BMI.  General Appearance: Casual  Eye Contact:: Good   Speech:  Clear and Coherent  Volume:  Normal  Mood:  Euthymic  Affect:  NA and Appropriate  Thought Process:  Coherent  Orientation:  Full (Time, Place, and Person)  Thought Content:  WDL  Suicidal Thoughts:  No  Homicidal Thoughts:  No  Memory:  NA  Judgement:  Good   Insight:  Good  Psychomotor Activity:  Normal  Concentration:  Good  Recall:  Good  Fund of Knowledge:Good  Language: Good  Akathisia:  No  Handed:  Right  AIMS (if indicated):     Assets:  Desire for Improvement  ADL's:  Intact  Cognition: WNL  Sleep:      Treatment Plan Summary: 06/27/2019,   Today the patient was seen alone.  He will continue taking his mood stabilizers.  He carries a diagnosis of bipolar disorder which is his first problem.  He will continue taking lithium and Depakote for this condition and continue Effexor 150.  His other problem is insomnia where he takes trazodone 50 mg 3 a day.  He is functioning fairly well.  He will return to see me in 2 months with his partner Angel [name].  He is stable.  At this time we do not have a Depakote level on his low dose but I imagine it will be actually very low.  We will talk about the possibility of other mood stabilizers if necessary.  For now his bowels are moving normally.

## 2019-06-30 ENCOUNTER — Ambulatory Visit: Payer: BC Managed Care – PPO | Admitting: Physical Therapy

## 2019-07-07 ENCOUNTER — Ambulatory Visit: Payer: BC Managed Care – PPO | Attending: Neurology | Admitting: Physical Therapy

## 2019-07-07 ENCOUNTER — Other Ambulatory Visit: Payer: Self-pay

## 2019-07-07 DIAGNOSIS — M5481 Occipital neuralgia: Secondary | ICD-10-CM | POA: Insufficient documentation

## 2019-07-07 DIAGNOSIS — R293 Abnormal posture: Secondary | ICD-10-CM | POA: Diagnosis not present

## 2019-07-07 DIAGNOSIS — M542 Cervicalgia: Secondary | ICD-10-CM | POA: Diagnosis not present

## 2019-07-07 DIAGNOSIS — R29898 Other symptoms and signs involving the musculoskeletal system: Secondary | ICD-10-CM

## 2019-07-07 NOTE — Patient Instructions (Signed)
    Home exercise program created by [name], PT.  For questions, please contact Franceen Erisman via phone at [phone] or email at [email or web address]  [facility] [place] [address]  [address] [place], , [address] Phone: [phone]   Fax:  [phone]    

## 2019-07-07 NOTE — Therapy (Signed)
[facility] [address]  [place], [place], [address] Phone: [phone]   Fax:  [phone]  Physical Therapy Treatment / Progress Note/ Discharge Summary  Patient Details  Name: Angel [name] MRN: [id number] Date of Birth: [birth year] Referring Provider (PT): Sarina Ill, MD  Progress Note  Reporting Period 05/26/2019 to 07/07/2019  See note below for Objective Data and Assessment of Progress/Goals.      Encounter Date: 07/07/2019  PT End of Session - 07/07/19 1102    Visit Number  10    Number of Visits  12    Date for PT Re-Evaluation  07/07/19    Authorization Type  BCBS    PT Start Time  1102    PT Stop Time  1159    PT Time Calculation (min)  57 min    Activity Tolerance  Patient tolerated treatment well    Behavior During Therapy  WFL for tasks assessed/performed       Past Medical History:  Diagnosis Date  . Bipolar disorder ([place])   . 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 Depression   . GERD (gastroesophageal reflux disease)   . Headache   . Hepatitis B    Hepatitis E. antigen positive by history, status post treatment with Hepsera and baraclude   . Hepatitis B carrier ([place])   . Personal history of colonic polyps    Adenomas polyp 2008 and 2010, 2015   . Weight loss    Pt. has lost 10 lbs since pre-visit, intentionally with diet and walking    Past Surgical History:  Procedure Laterality Date  . APPENDECTOMY    . COLONOSCOPY    . POLYPECTOMY    . UPPER GASTROINTESTINAL ENDOSCOPY      There were no vitals filed for this visit.  Subjective Assessment - 07/07/19 1105    Subjective  Pt reporting he has "been doing his exercises and feeling pretty good". Still notes a "little stiffness".    Patient Stated Goals  "To determine if I need surgery. To relieve pain from my neck and get rid of the headaches w/o meds."    Currently in Pain?  No/denies         [facility] PT Assessment  - 07/07/19 1102      Assessment   Medical Diagnosis  L cervical myofascial pain & occipital neuralgia    Referring Provider (PT)  Sarina Ill, MD    Next MD Visit  TBD      Observation/Other Assessments   Focus on Therapeutic Outcomes (FOTO)   Neck - 64% (36% limitation)      AROM   Overall AROM Comments  no pain with any motion    Cervical Flexion  47    Cervical Extension  46    Cervical - Right Side Bend  28    Cervical - Left Side Bend  27    Cervical - Right Rotation  58    Cervical - Left Rotation  54                   [facility] Adult PT Treatment/Exercise - 07/07/19 1102      Exercises   Exercises  Neck      Neck Exercises: Machines for Strengthening   UBE (Upper Arm Bike)  L2.5 x 6 min (3' fwd/3' back)      Neck Exercises: Theraband   Shoulder Extension  10 reps;Green    Shoulder  Extension Limitations  cues for scap retraction with shoulder extension to neutral, avoiding excessive trunk extension and/or neck flexion    Rows  15 reps;Green    Rows Limitations  cues for staggered stance to better recruit abdominal muscles to avoid excessive lumbar lordosis; cues for scap retraction & depression keeping arms at side with forearms parallel to floor and avoiding excessive shoulder extension or upward rotation of elbows    Shoulder External Rotation  10 reps;Red    Shoulder External Rotation Limitations  standing with back against door jamb for cues for scap retraction    Horizontal ABduction  10 reps;Red    Horizontal ABduction Limitations  standing with back against door jamb for cues for scap retraction    Other Theraband Exercises  Alt UE diagonals with red TB x 10; standing with back against door jamb for cues for scap retraction      Neck Exercises: Seated   Neck Retraction  10 reps;5 secs    Neck Retraction Limitations  yellow TB      Neck Exercises: Prone   Neck Retraction  10 reps;3 secs      Neck Exercises: Stretches   Upper Trapezius Stretch   Right;Left;30 seconds;1 rep    Upper Trapezius Stretch Limitations  hands anchored on chair     Levator Stretch  Right;Left;30 seconds;1 rep    Levator Stretch Limitations  cues to keep hand behind/under buttock to maintain neutral shoulder    Corner Stretch  30 seconds;3 reps    Corner Stretch Limitations  3-way doorway stretch - pt noting more of shoulder stretch than chest stretch with mid position (uncomfortable, therefore deferred from HEP with instructions only for low & high positions)             PT Education - 07/07/19 1159    Education Details  Final HEP review/update    Person(s) Educated  Patient    Methods  Explanation;Demonstration;Handout    Comprehension  Verbalized understanding;Returned demonstration       PT Short Term Goals - 06/18/19 1110      PT SHORT TERM GOAL #1   Title  Patient to be independent with initial HEP.    Status  Achieved   06/09/19     PT SHORT TERM GOAL #2   Title  Patient will verbalize/demonstrate understanding of neutral spine posture and proper body mechanics to reduce strain on cervical spine.    Status  Achieved   06/11/19       PT Long Term Goals - 07/07/19 1111      PT LONG TERM GOAL #1   Title  Patient to be independent with advanced/ongoing HEP.    Status  Achieved   07/07/19     PT LONG TERM GOAL #2   Title  Patient will verbalize/demonstrate good awareness of neutral spine posture and proper body mechanics for daily tasks including computer usage.    Status  Achieved   07/07/19     PT LONG TERM GOAL #3   Title  Patient to improve cervical AROM to WNL without pain provocation.    Status  Achieved   07/07/19     PT LONG TERM GOAL #4   Title  Patient to report pain reduction in frequency and intensity of headaches by >/= 50%.    Status  Achieved   06/18/19     PT LONG TERM GOAL #5   Title  Patient will be able to resume walking for exercise w/o exacerbation  of neck pain or headaches.    Status  Achieved   07/07/19            Plan - 07/07/19 1109    Clinical Impression Statement  Angel [name] reports he has "been doing his exercises and feeling pretty good" with no recent neck pain or headaches, although still notes a "little stiffness" at time. He reports he is more conscious and aware of his posture, has reduced the number of pillows when sleeping and avoids sitting for prolonged periods at the desktop computer. Cervical ROM now essentially WFL with no pain reported. He has not formally resumed walking for exercise but was able to do a lot of walking while on vacation last week without exacerbation of his neck pain or headaches. We reviewed and updated his HEP with summarized comprehensive HEP handout provided and he feels confident continuing on his own with these exercises. All goals met for this episode, therefore will proceed with transition to HEP and discharge from PT for this episode.    Personal Factors and Comorbidities  Comorbidity 3+;Time since onset of injury/illness/exacerbation;Fitness;Past/Current Experience    Comorbidities  bipolar disorder, hepatitis B, hepatic cirrhosis, GERD, h/o falls    Examination-Activity Limitations  Locomotion Level;Sleep;Sit    Examination-Participation Restrictions  Community Activity;Driving;Interpersonal Relationship    Rehab Potential  Good    PT Frequency  --    PT Duration  --    PT Treatment/Interventions  ADLs/Self Care Home Management;Cryotherapy;Electrical Stimulation;Moist Heat;Traction;Ultrasound;Functional mobility training;Therapeutic activities;Therapeutic exercise;Balance training;Neuromuscular re-education;Patient/family education;Manual techniques;Passive range of motion;Dry needling;Taping;Spinal Manipulations;Joint Manipulations    PT Next Visit Plan  discharge with transition to HEP    PT Home Exercise Plan  07/07/19 - summarized comprehensive HEP -  low & high doorway stretch, UT, LS stretch, POE cervical retraction, yellow TB cervical retraction, green  TB rows, red TB scap retraction + B horiz ABD, B shoulder ER & alt UE diagonals    Consulted and Agree with Plan of Care  Patient       Patient will benefit from skilled therapeutic intervention in order to improve the following deficits and impairments:  Decreased activity tolerance, Decreased balance, Decreased endurance, Decreased knowledge of precautions, Decreased mobility, Decreased range of motion, Decreased strength, Difficulty walking, Increased fascial restricitons, Increased muscle spasms, Impaired perceived functional ability, Impaired flexibility, Impaired UE functional use, Improper body mechanics, Postural dysfunction, Pain  Visit Diagnosis: Occipital neuralgia of left side  Cervicalgia  Abnormal posture  Other symptoms and signs involving the musculoskeletal system     Problem List Patient Active Problem List   Diagnosis Date Noted  . Occipital neuralgia of left side 04/10/2019  . Hyperglycemia 10/07/2013  . Hepatic cirrhosis ([employer]) 10/03/2013  . Bipolar I disorder, most recent episode (or current) depressed, unspecified 05/21/2013  . Hepatitis B 03/21/2013  . Esophageal reflux 03/21/2013  . Personal history of colonic polyps 03/21/2013  . Bipolar disorder, unspecified ([place]) 09/03/2011     PHYSICAL THERAPY DISCHARGE SUMMARY  Visits from Start of Care: 10  Current functional level related to goals / functional outcomes:   Refer to above clinical impression.   Remaining deficits:   As above.   Education / Equipment:   HEP, Biomedical scientist education  Plan: Patient agrees to discharge.  Patient goals were met. Patient is being discharged due to meeting the stated rehab goals.  ?????      Percival Spanish, PT, MPT 07/07/2019, 12:27 PM  [facility] [address]  [place], [place], [address] Phone: [phone]   Fax:  [phone]  Name: Angel [name] MRN: [id number] Date of  Birth: [birth year]

## 2019-07-14 ENCOUNTER — Encounter: Payer: Self-pay | Admitting: Medical

## 2019-07-14 DIAGNOSIS — Z79899 Other long term (current) drug therapy: Principal | ICD-10-CM

## 2019-07-14 DIAGNOSIS — B181 Chronic viral hepatitis B without delta-agent: Principal | ICD-10-CM

## 2019-07-14 DIAGNOSIS — K7460 Unspecified cirrhosis of liver: Principal | ICD-10-CM

## 2019-07-15 ENCOUNTER — Encounter: Admit: 2019-07-15 | Discharge: 2019-07-16 | Payer: MEDICARE | Attending: Gastroenterology | Primary: Gastroenterology

## 2019-07-15 DIAGNOSIS — K7469 Other cirrhosis of liver: Principal | ICD-10-CM

## 2019-07-16 ENCOUNTER — Other Ambulatory Visit: Payer: Self-pay

## 2019-07-16 ENCOUNTER — Telehealth: Payer: Self-pay | Admitting: Medical

## 2019-07-16 ENCOUNTER — Encounter: Payer: Self-pay | Admitting: Medical

## 2019-07-16 ENCOUNTER — Ambulatory Visit (INDEPENDENT_AMBULATORY_CARE_PROVIDER_SITE_OTHER): Payer: BC Managed Care – PPO | Admitting: Medical

## 2019-07-16 VITALS — BP 120/76 | HR 69

## 2019-07-16 DIAGNOSIS — K140 Glossitis: Secondary | ICD-10-CM | POA: Diagnosis not present

## 2019-07-16 DIAGNOSIS — B370 Candidal stomatitis: Secondary | ICD-10-CM

## 2019-07-16 MED ORDER — FLUCONAZOLE 150 MG PO TABS: ORAL_TABLET | ORAL | 0 refills | Status: DC

## 2019-07-16 MED ORDER — MAGIC MOUTHWASH: ORAL | 0 refills | Status: DC

## 2019-07-16 NOTE — Patient Instructions (Signed)
By exam and history concern for thrush vs glossitis. Will rx difliucan and magic mouth wash. Asking MA to fax over rx and confirm they will give formulation requested for mouth wash or similar equivalent.  Future b1, b1, and folate lab. Asking MA to fax over order to [facility] when pt give [place] fax number.  Follow up 7-10 days or as needed. If tongue does not return to normal explained will need to evaluate in person and then make decision if ENT referral needed. Pt expressed understanding.

## 2019-07-16 NOTE — Telephone Encounter (Signed)
magic mouthwash SOLN [id number]   Patient is requesting Magic mouth wash be since to  [facility] ?[address] [place], [place] [address] Phone:  [phone]

## 2019-07-16 NOTE — Progress Notes (Signed)
   Subjective:    Patient ID: Angel [name], male    DOB: Feb 14, 1956, 64 y.o.   MRN: [id number]  HPI  Virtual Visit via Telephone Note  I connected with [name] on 07/16/19 at  2:40 PM EST by telephone and verified that I am speaking with the correct person using two identifiers.  Location: Patient: home Provider: office   I discussed the limitations, risks, security and privacy concerns of performing an evaluation and management service by telephone and the availability of in person appointments. I also discussed with the patient that there may be a patient responsible charge related to this service. The patient expressed understanding and agreed to proceed.   History of Present Illness: Pt states soreness on side of his tongue and some spots underneath. He has tried oragel and liquid and pt tried salt water gargles. Pt never chewed. No swelling underneath chin or jaw line.  Pt states he never had this before in the past.  No white discharge or film.  Pt in next week will be getting liver enzymes at [facility].     Observations/Objective: General-no acute distress, pleasant, oriented. Lungs- on inspection lungs appear unlabored. Neck- no tracheal deviation or jvd on inspection. Neuro- gross motor function appears intact. Mouth- tongue by video has beefy red appearance and some craking on side of tongue rt side.(brief video visit at beginning to visualize tongue but audio did not work then called him)  Assessment and Plan: By exam and history concern for thrush vs glossitis. Will rx difliucan and magic mouth wash. Asking MA to fax over rx and confirm they will give formulation requested for mouth wash or similar equivalent.  Future b1, b1, and folate lab. Asking MA to fax over order to [facility] when pt give [place] fax number.  Follow up 7-10 days or as needed. If tongue does not return to normal explained will need to evaluate in person and then make decision if ENT referral  needed. Pt expressed understanding.  Follow Up Instructions:    I discussed the assessment and treatment plan with the patient. The patient was provided an opportunity to ask questions and all were answered. The patient agreed with the plan and demonstrated an understanding of the instructions.   The patient was advised to call back or seek an in-person evaluation if the symptoms worsen or if the condition fails to improve as anticipated.  I provided 25 minutes of non-face-to-face time during this encounter.   [name], PA-C   Review of Systems     Objective:   Physical Exam        Assessment & Plan:

## 2019-07-16 NOTE — Telephone Encounter (Signed)
There is b12, b1, and folate order in epic. Will you call [facility] and fax order to them. Pt getting this done within next week. Put our fax number on order so we can get results.  [facility] off of [employer] Number and Fax for Blood WORK you want done for me:  [phone]   Fax [phone]

## 2019-07-16 NOTE — Telephone Encounter (Signed)
Faxed and sent to [place]

## 2019-07-17 DIAGNOSIS — K7460 Unspecified cirrhosis of liver: Principal | ICD-10-CM

## 2019-07-17 DIAGNOSIS — Z1289 Encounter for screening for malignant neoplasm of other sites: Principal | ICD-10-CM

## 2019-07-17 DIAGNOSIS — B181 Chronic viral hepatitis B without delta-agent: Principal | ICD-10-CM

## 2019-07-17 NOTE — Addendum Note (Signed)
Addended by: Jeronimo Greaves on: 07/17/2019 01:59 PM   Modules accepted: Orders

## 2019-07-17 NOTE — Telephone Encounter (Signed)
Labs redropped and orders faxed to [facility]

## 2019-07-21 ENCOUNTER — Other Ambulatory Visit: Payer: Self-pay | Admitting: Medical

## 2019-07-21 DIAGNOSIS — K140 Glossitis: Secondary | ICD-10-CM | POA: Diagnosis not present

## 2019-07-21 DIAGNOSIS — Z1289 Encounter for screening for malignant neoplasm of other sites: Secondary | ICD-10-CM | POA: Diagnosis not present

## 2019-07-21 DIAGNOSIS — K7460 Unspecified cirrhosis of liver: Secondary | ICD-10-CM | POA: Diagnosis not present

## 2019-07-21 DIAGNOSIS — B181 Chronic viral hepatitis B without delta-agent: Secondary | ICD-10-CM | POA: Diagnosis not present

## 2019-07-23 ENCOUNTER — Telehealth: Payer: Self-pay | Admitting: Medical

## 2019-07-23 ENCOUNTER — Encounter: Payer: Self-pay | Admitting: Medical

## 2019-07-23 NOTE — Telephone Encounter (Signed)
Opend up by accident.

## 2019-07-23 NOTE — Telephone Encounter (Signed)
Is it ok to refill mouth wash ?

## 2019-07-23 NOTE — Telephone Encounter (Signed)
Opened to review 

## 2019-07-24 ENCOUNTER — Other Ambulatory Visit: Payer: Self-pay

## 2019-07-24 MED ORDER — MAGIC MOUTHWASH: ORAL | 0 refills | Status: DC

## 2019-07-28 LAB — VITAMIN B1: Thiamine: 192.4 nmol/L (ref 66.5–200.0)

## 2019-07-28 LAB — FOLATE: Folate: 16.8 ng/mL (ref >3.0)

## 2019-07-28 LAB — VITAMIN B12: Vitamin B-12: 797 pg/mL (ref 232–1245)

## 2019-08-03 ENCOUNTER — Other Ambulatory Visit: Payer: Self-pay | Admitting: Medical

## 2019-08-03 DIAGNOSIS — K7469 Other cirrhosis of liver: Principal | ICD-10-CM

## 2019-08-03 DIAGNOSIS — I8510 Secondary esophageal varices without bleeding: Principal | ICD-10-CM

## 2019-08-03 DIAGNOSIS — K766 Portal hypertension: Principal | ICD-10-CM

## 2019-08-03 DIAGNOSIS — B181 Chronic viral hepatitis B without delta-agent: Principal | ICD-10-CM

## 2019-08-04 MED ORDER — NADOLOL 40 MG TABLET
ORAL_TABLET | 3 refills | 0 days | Status: CP
Start: 2019-08-04 — End: ?

## 2019-08-17 ENCOUNTER — Other Ambulatory Visit (HOSPITAL_COMMUNITY): Payer: Self-pay | Admitting: Psychiatry

## 2019-08-18 ENCOUNTER — Telehealth (HOSPITAL_COMMUNITY): Payer: Self-pay

## 2019-08-18 NOTE — Telephone Encounter (Signed)
Pt requesting 90 day supply of lithium rx. Next appointment 5/4. Please review and advise.

## 2019-08-25 DIAGNOSIS — I8510 Secondary esophageal varices without bleeding: Principal | ICD-10-CM

## 2019-08-25 DIAGNOSIS — K7469 Other cirrhosis of liver: Principal | ICD-10-CM

## 2019-08-25 DIAGNOSIS — B181 Chronic viral hepatitis B without delta-agent: Principal | ICD-10-CM

## 2019-08-25 DIAGNOSIS — K766 Portal hypertension: Principal | ICD-10-CM

## 2019-08-25 MED ORDER — NADOLOL 40 MG TABLET
ORAL_TABLET | Freq: Every day | ORAL | 3 refills | 90 days | Status: CP
Start: 2019-08-25 — End: ?

## 2019-08-27 ENCOUNTER — Encounter (HOSPITAL_COMMUNITY): Payer: Self-pay

## 2019-08-27 NOTE — Progress Notes (Signed)
Opened in error

## 2019-09-05 ENCOUNTER — Ambulatory Visit (HOSPITAL_COMMUNITY): Payer: BC Managed Care – PPO | Admitting: Psychiatry

## 2019-09-09 ENCOUNTER — Other Ambulatory Visit: Payer: Self-pay

## 2019-09-09 ENCOUNTER — Telehealth (HOSPITAL_COMMUNITY): Payer: BC Managed Care – PPO | Admitting: Psychiatry

## 2019-09-15 ENCOUNTER — Telehealth: Payer: Self-pay | Admitting: Medical

## 2019-09-15 NOTE — Telephone Encounter (Signed)
Caller Daymon Buchholtz   Call Back # [phone]  Patient states he needs referral to [facility], patient states that he would like to go to [facility] .

## 2019-09-16 NOTE — Telephone Encounter (Signed)
I can put in that referral but need to know is this for counseling or psychiatrist. I thought he already had psychiatrist. Is he making a change. Does he still have access to current psychiatrist. Sometimes getting in with new one can take time. How is he doing mood wise?

## 2019-09-17 NOTE — Telephone Encounter (Signed)
Called pt and unable to lvm 

## 2019-09-18 ENCOUNTER — Telehealth: Payer: Self-pay | Admitting: Medical

## 2019-09-18 DIAGNOSIS — F319 Bipolar disorder, unspecified: Secondary | ICD-10-CM

## 2019-09-18 NOTE — Telephone Encounter (Signed)
Patient stated he would like to see [facility] because its close to his house, and patient states he is doing well mood iwse

## 2019-09-18 NOTE — Telephone Encounter (Signed)
Let pt know that he needs to call [facility] behavioral health and initiate referral early next week. I have placed the referral but most behavaioral health want pt to be active in appointment scheduling process. They can see the referral I put in.  After he calls and if prolonged delay we can make follow up call.

## 2019-09-18 NOTE — Telephone Encounter (Signed)
Patient stated he would like to see [facility] because its close to his house, and patient states he is doing well.

## 2019-09-18 NOTE — Telephone Encounter (Signed)
Patient called and unable to lvm

## 2019-09-18 NOTE — Telephone Encounter (Signed)
Referral to new psychiarist. [facility].

## 2019-09-19 NOTE — Telephone Encounter (Signed)
Called pt unable to lvm

## 2019-09-22 NOTE — Telephone Encounter (Signed)
Called pt unable to lvm

## 2019-09-23 ENCOUNTER — Telehealth: Payer: Self-pay

## 2019-09-23 NOTE — Telephone Encounter (Signed)
Called pt and unable to lvm 

## 2019-09-23 NOTE — Telephone Encounter (Addendum)
Patient called in to see if PA Eward Saguier could send in a referral to [facility] patient facility. Please follow up with the patient at [phone] or call [phone]  and advise. Patient called in today 09/24/2019  to speak with the nurse.

## 2019-09-24 NOTE — Telephone Encounter (Signed)
Called both new numbers and lvm to return call

## 2019-09-24 NOTE — Telephone Encounter (Signed)
Called pt unable to leave voicemail

## 2019-09-24 NOTE — Telephone Encounter (Signed)
Called pt and lvm

## 2019-09-25 ENCOUNTER — Telehealth (HOSPITAL_COMMUNITY): Payer: Self-pay | Admitting: *Deleted

## 2019-09-25 NOTE — Telephone Encounter (Signed)
Writer has received approximately 6 messages between yesterday and today regarding referral to change providers to psychiatrist in the [facility] clinic. Writer has returned all calls but each time get VM. Pt has been leaving hostile messages and even stated that "if this is not done by tomorrow there will be consequences". Writer returned this call as well and once again explained the process. That nurse will speak wit Dr. Casimiro Needle tomorrow regarding this matter, then he will contact MD at [facility] office and whether or not he is accepted first. Provided he is then we will progress from there.

## 2019-09-26 ENCOUNTER — Telehealth (HOSPITAL_COMMUNITY): Payer: Self-pay | Admitting: *Deleted

## 2019-09-26 ENCOUNTER — Other Ambulatory Visit (HOSPITAL_COMMUNITY): Payer: Self-pay | Admitting: *Deleted

## 2019-09-26 DIAGNOSIS — F330 Major depressive disorder, recurrent, mild: Secondary | ICD-10-CM

## 2019-09-26 MED ORDER — CLONAZEPAM 1 MG PO TABS: ORAL_TABLET | ORAL | 5 refills | Status: DC

## 2019-09-26 NOTE — Telephone Encounter (Signed)
See Brittanae note. Advised pt he should stay with current psychiatrist. Most specialist do review cases and some might think cases are too challenging?   So would continue to see current psychiatrist as if he cuts off any ties then may have difficult time finding new one. We can still try to find new if he wants just would not formerly tell current office he is switching till one is found.  I was under impression his current psychiatrist was good? So maybe best decision stick with that practice.

## 2019-09-26 NOTE — Telephone Encounter (Deleted)
Received request for refill of ADHD Medication. Saguier, Percell Miller, PA-C Current Outpatient Medications  Medication Sig Dispense Refill  . bisacodyl (BISACODYL) 5 MG EC tablet TAKE 1 TABLET BY MOUTH EVERY DAY AS NEEDED FOR MODERATE CONSTIPATION 90 tablet 3  . clonazePAM (KLONOPIN) 1 MG tablet Take 2 tablets (total 2 mg) at bedtime. 60 tablet 5  . diphenhydrAMINE (BENADRYL) 25 MG tablet Take 75 mg by mouth at bedtime as needed.    . divalproex (DEPAKOTE ER) 250 MG 24 hr tablet 1 bid 60 tablet 5  . famciclovir (FAMVIR) 500 MG tablet Take 1 tablet (500 mg total) by mouth 3 (three) times daily. 21 tablet 3  . fluconazole (DIFLUCAN) 150 MG tablet 1 tab po today and another in 2 days 2 tablet 0  . Fremanezumab-vfrm (AJOVY) 225 MG/1.5ML SOAJ Inject 725 mg into the skin every 30 (thirty) days. 3 pen 0  . Inulin-Cholecalciferol (FIBER/D3 ADULT GUMMIES) 2.5-500 GM-UNIT CHEW 1 tab po q day 90 tablet 3  . ketorolac (TORADOL) 10 MG tablet Take 1 tablet (10 mg total) by mouth every 8 (eight) hours as needed for severe pain. 1 tab po q 8 hours prn headache 12 tablet 0  . lithium 600 MG capsule TAKE 1 CAPSULE BY MOUTH EVERY MORNING AND 2 CAPSULES EVERY NIGHT AT BEDTIME 90 capsule 5  . magic mouthwash SOLN 5 ml po qid swish and spit. 200 mL 0  . Melatonin 10 MG SUBL Place 10 mg under the tongue at bedtime.    . methocarbamol (ROBAXIN) 750 MG tablet TAKE 1 TABLET BY MOUTH EVERY 8 HOURS AS NEEDED FOR MUSCLE SPASMS 90 tablet 0  . nadolol (CORGARD) 40 MG tablet TAKE 1 TABLET BY MOUTH EVERY DAY 30 tablet 0  . traZODone (DESYREL) 50 MG tablet TAKE 3 TABLETS DAILY AT BEDTIME AND MAY REPEAT WITH ONE MORE TABLET 100 tablet 12  . venlafaxine XR (EFFEXOR-XR) 150 MG 24 hr capsule Take 1 capsule (150 mg total) by mouth every morning. 90 capsule 1   No current facility-administered medications for this visit.   Last refill date of {med adhd:312039} Medication: # dispensed: *** # days of med left: *** To be picked up at [facility]  pharmacy.  Does Patterson seem to have any problems with moodiness, appetite, weight loss, or sleep? {yes [id number] Any complaints by [name] about taking the medications? {yes [id number] When was he last examined for ADHD? *** Who is he seeing for his ADHD symptoms? *** When is his next appointment due? *** Rx request routed to Dr. Marland Kitchen for signature.

## 2019-09-26 NOTE — Telephone Encounter (Signed)
Patient was contacted and called [facility] this morning but told "his case was too complicated". He is unsure what to do now.

## 2019-09-26 NOTE — Telephone Encounter (Signed)
LM for pt to returncall

## 2019-09-26 NOTE — Telephone Encounter (Signed)
Referral was completed and patient advised of PCP recommendations.

## 2019-09-26 NOTE — Telephone Encounter (Signed)
Writer received yet another hostile message from pt regarding switching providers but primarily threatening that if his Klonopin is not refilled he is going to file a c/o with the [place] [employer] against this clinic. Medication was filled by writer per Dr. Casimiro Needle as it wasn't filled on last visit on 06/1919. [facility] agrees [facility] will notify pt when medication is ready.

## 2019-09-26 NOTE — Telephone Encounter (Signed)
Pt left VM for this writer again today demanding that his Klonopin be refilled and he was going to file a c/o against this office and this Probation officer in particular if his Klonopin is not refilled. Also Probation officer had told him previously that he needed to make an appointment for med management as he had missed two previous appointments. Writer had passed on to Dr. Casimiro Needle who called pt but did not send in refill at that time. Today Probation officer refilled the Klonopin 1 mg per Dr. Casimiro Needle. Writer asked [facility] to call and let pt know when Rx is ready. They agreed. Pt has no upcoming appointments.

## 2019-09-27 ENCOUNTER — Other Ambulatory Visit: Payer: Self-pay | Admitting: Medical

## 2019-09-27 ENCOUNTER — Other Ambulatory Visit (HOSPITAL_COMMUNITY): Payer: Self-pay | Admitting: Psychiatry

## 2019-09-27 DIAGNOSIS — F331 Major depressive disorder, recurrent, moderate: Secondary | ICD-10-CM

## 2019-10-01 ENCOUNTER — Encounter: Admit: 2019-10-01 | Discharge: 2019-10-01 | Payer: MEDICARE

## 2019-10-01 DIAGNOSIS — K7469 Other cirrhosis of liver: Secondary | ICD-10-CM | POA: Diagnosis not present

## 2019-10-01 DIAGNOSIS — R161 Splenomegaly, not elsewhere classified: Secondary | ICD-10-CM | POA: Diagnosis not present

## 2019-10-01 DIAGNOSIS — R932 Abnormal findings on diagnostic imaging of liver and biliary tract: Secondary | ICD-10-CM | POA: Diagnosis not present

## 2019-10-01 DIAGNOSIS — K766 Portal hypertension: Secondary | ICD-10-CM | POA: Diagnosis not present

## 2019-10-02 ENCOUNTER — Telehealth (HOSPITAL_COMMUNITY): Payer: Self-pay | Admitting: *Deleted

## 2019-10-02 NOTE — Telephone Encounter (Signed)
[facility] called requesting, per pt, refills on both Lithium and Klonopin. Pt has existing scripts with multiple refills on both medications through [id number].

## 2019-10-06 DIAGNOSIS — K7460 Unspecified cirrhosis of liver: Principal | ICD-10-CM

## 2019-10-06 DIAGNOSIS — B181 Chronic viral hepatitis B without delta-agent: Principal | ICD-10-CM

## 2019-10-06 DIAGNOSIS — Z1289 Encounter for screening for malignant neoplasm of other sites: Principal | ICD-10-CM

## 2019-10-08 ENCOUNTER — Other Ambulatory Visit (HOSPITAL_COMMUNITY): Payer: Self-pay | Admitting: *Deleted

## 2019-10-08 ENCOUNTER — Ambulatory Visit: Admit: 2019-10-08 | Payer: MEDICARE | Attending: Gastroenterology | Primary: Gastroenterology

## 2019-10-08 ENCOUNTER — Encounter: Admit: 2019-10-08 | Payer: MEDICARE | Attending: Gastroenterology | Primary: Gastroenterology

## 2019-10-08 DIAGNOSIS — K7460 Unspecified cirrhosis of liver: Principal | ICD-10-CM

## 2019-10-08 DIAGNOSIS — K769 Liver disease, unspecified: Principal | ICD-10-CM

## 2019-10-08 DIAGNOSIS — F330 Major depressive disorder, recurrent, mild: Secondary | ICD-10-CM

## 2019-10-08 DIAGNOSIS — F331 Major depressive disorder, recurrent, moderate: Secondary | ICD-10-CM

## 2019-10-08 MED ORDER — LITHIUM CARBONATE 600 MG PO CAPS: ORAL_CAPSULE | ORAL | 5 refills | Status: DC

## 2019-10-08 MED ORDER — CLONAZEPAM 1 MG PO TABS: ORAL_TABLET | ORAL | 5 refills | Status: DC

## 2019-10-08 MED ORDER — VENLAFAXINE HCL ER 150 MG PO CP24: ORAL_CAPSULE | ORAL | 4 refills | Status: DC

## 2019-10-08 MED ORDER — DIVALPROEX SODIUM ER 250 MG PO TB24: ORAL_TABLET | ORAL | 5 refills | Status: DC

## 2019-10-08 MED ORDER — TRAZODONE HCL 50 MG PO TABS: ORAL_TABLET | ORAL | 12 refills | Status: DC

## 2019-10-14 ENCOUNTER — Other Ambulatory Visit (HOSPITAL_COMMUNITY): Payer: Self-pay | Admitting: Psychiatry

## 2019-10-14 ENCOUNTER — Encounter: Payer: Self-pay | Admitting: Medical

## 2019-10-14 DIAGNOSIS — F330 Major depressive disorder, recurrent, mild: Secondary | ICD-10-CM

## 2019-10-21 DIAGNOSIS — F319 Bipolar disorder, unspecified: Secondary | ICD-10-CM | POA: Diagnosis not present

## 2019-10-30 DIAGNOSIS — K7460 Unspecified cirrhosis of liver: Secondary | ICD-10-CM | POA: Diagnosis not present

## 2019-10-30 DIAGNOSIS — B181 Chronic viral hepatitis B without delta-agent: Secondary | ICD-10-CM | POA: Diagnosis not present

## 2019-10-30 DIAGNOSIS — Z79899 Other long term (current) drug therapy: Secondary | ICD-10-CM | POA: Diagnosis not present

## 2019-10-30 DIAGNOSIS — Z1289 Encounter for screening for malignant neoplasm of other sites: Secondary | ICD-10-CM | POA: Diagnosis not present

## 2019-11-03 DIAGNOSIS — T443X5S Adverse effect of other parasympatholytics [anticholinergics and antimuscarinics] and spasmolytics, sequela: Secondary | ICD-10-CM | POA: Diagnosis not present

## 2019-11-03 DIAGNOSIS — G4700 Insomnia, unspecified: Secondary | ICD-10-CM | POA: Diagnosis not present

## 2019-11-03 DIAGNOSIS — F319 Bipolar disorder, unspecified: Secondary | ICD-10-CM | POA: Diagnosis not present

## 2019-11-03 DIAGNOSIS — B180 Chronic viral hepatitis B with delta-agent: Secondary | ICD-10-CM | POA: Diagnosis not present

## 2019-11-14 ENCOUNTER — Encounter: Payer: Self-pay | Admitting: Medical

## 2019-11-15 DIAGNOSIS — F319 Bipolar disorder, unspecified: Secondary | ICD-10-CM | POA: Diagnosis not present

## 2019-11-15 DIAGNOSIS — G4700 Insomnia, unspecified: Secondary | ICD-10-CM | POA: Diagnosis not present

## 2019-11-18 ENCOUNTER — Encounter: Payer: Self-pay | Admitting: Medical

## 2019-11-20 ENCOUNTER — Telehealth: Payer: BC Managed Care – PPO | Admitting: Medical

## 2019-11-21 ENCOUNTER — Other Ambulatory Visit: Payer: Self-pay

## 2019-11-21 ENCOUNTER — Telehealth (INDEPENDENT_AMBULATORY_CARE_PROVIDER_SITE_OTHER): Payer: BC Managed Care – PPO | Admitting: Medical

## 2019-11-21 DIAGNOSIS — R519 Headache, unspecified: Secondary | ICD-10-CM

## 2019-11-21 DIAGNOSIS — M542 Cervicalgia: Secondary | ICD-10-CM | POA: Diagnosis not present

## 2019-11-21 DIAGNOSIS — M5481 Occipital neuralgia: Secondary | ICD-10-CM

## 2019-11-21 DIAGNOSIS — R635 Abnormal weight gain: Secondary | ICD-10-CM

## 2019-11-21 DIAGNOSIS — R61 Generalized hyperhidrosis: Secondary | ICD-10-CM | POA: Diagnosis not present

## 2019-11-21 DIAGNOSIS — Z125 Encounter for screening for malignant neoplasm of prostate: Secondary | ICD-10-CM

## 2019-11-21 DIAGNOSIS — K7469 Other cirrhosis of liver: Secondary | ICD-10-CM

## 2019-11-21 DIAGNOSIS — F319 Bipolar disorder, unspecified: Secondary | ICD-10-CM

## 2019-11-21 DIAGNOSIS — R350 Frequency of micturition: Secondary | ICD-10-CM

## 2019-11-21 NOTE — Progress Notes (Signed)
   Subjective:    Patient ID: Angel [name], male    DOB: December 15, 1955, 64 y.o.   MRN: [id number]  HPI  Virtual Visit via Video Note  I connected with Angel [name] on 11/21/19 at  1:40 PM EDT by a video enabled telemedicine application and verified that I am speaking with the correct person using two identifiers.  Location: Patient: home Provider: office   I discussed the limitations of evaluation and management by telemedicine and the availability of in person appointments. The patient expressed understanding and agreed to proceed.  History of Present Illness:  Appointment  for follow up.  He is going to see a new psychiatrist. MD is  Making varius medication changes slowly. Pt has stopped benadryl, effexor and trazadone. Pt has no longer been on clonazepam. Dr. Hildred Priest psychiatrist. His mood is stable.  Pt also seeing new liver specialist  GI MD. New appointment in December.   Recently he has been sweating easily with minimal activity. He is drinking a lot of fluids. No recorded fever. On review no obvious infection symptoms.  Pt still having neck pain. Pt has seen specialist in the past. Tens unit seems to be helping. Pt has been using robaxin in the past now using robaxin rarely.  Pt has seen Dr. Jaynee Eagles in the past. Dx of occipital neuralgia. Still having neck pain with ha. Treatment with Dr Jaynee Eagles helped.   Angel [name] needs to have time off for fmla. Pt will need 4-6 weeks.   Pt has  also gained wt.   Observations/Objective: General-no acute distress, pleasant, oriented. Lungs- on inspection lungs appear unlabored. Neck- no tracheal deviation or jvd on inspection. Neuro- gross motor function appears intact.  Assessment and Plan:  For history of bipolar continue medication changes advised by new psychiatrist.  For neck pain and ha history can continue sparing use of robaxin. Will refer you back to Dr. Jaynee Eagles.   Keep appointment with GI MD in December.  For sweating  episodes.I want you to check temperature to see if running fever. Will get psa, cbc, cmp, urine poct and urine culture. Pt scheuled lab for next week.  For weight gain will get tsh. Advise weight watcher diet app.  Discussed fmla form and how would fill out so Angel [name] can help over next 6 weeks.  Follow up in 10 days or as needed   Follow Up Instructions:    I discussed the assessment and treatment plan with the patient. The patient was provided an opportunity to ask questions and all were answered. The patient agreed with the plan and demonstrated an understanding of the instructions.   The patient was advised to call back or seek an in-person evaluation if the symptoms worsen or if the condition fails to improve as anticipated.    [name], PA-C  Time spent with patient today was 40 minutes which consisted of chart review, discussing diagnosis, work up, answering question, treatment, discussing how would fill out fmla form, placing referral  and documentation.  Review of Systems     Objective:   Physical Exam        Assessment & Plan:  .

## 2019-11-21 NOTE — Patient Instructions (Addendum)
For history of bipolar continue medication changes advised by new psychiatrist. Reviewed new psychiatrist med changes.  For neck pain and ha history can continue sparing use of robaxin. Will refer you back to Dr. Jaynee Eagles.   Keep appointment with GI MD in December for hepatitis.   For sweating episodes.I want you to check temperature to see if running fever. Will get psa, cbc, cmp, urine poct and urine culture. Pt scheuled lab for next week.  For weight gain will get tsh. Advise weight watcher diet app.  Discussed fmla form and how would fill out so [name] can help over next 6 weeks.  Follow up in 10 days or as needed

## 2019-11-23 ENCOUNTER — Other Ambulatory Visit: Payer: Self-pay | Admitting: Medical

## 2019-11-24 ENCOUNTER — Telehealth: Payer: Self-pay | Admitting: Neurology

## 2019-11-24 ENCOUNTER — Encounter: Payer: Self-pay | Admitting: Medical

## 2019-11-24 NOTE — Telephone Encounter (Signed)
Do you want to see him for a visit or just schedule a nurse visit for Toradol

## 2019-11-24 NOTE — Telephone Encounter (Signed)
Patient called inquiring about injections.

## 2019-11-24 NOTE — Telephone Encounter (Signed)
Attempted to call the patient due to receiving a mychart message indicating an apt was needed. I called there was an opening available as early as 3 pm today. LVM advising of this and also asked for a return call.

## 2019-11-25 ENCOUNTER — Telehealth: Payer: Self-pay | Admitting: Medical

## 2019-11-25 ENCOUNTER — Encounter: Payer: Self-pay | Admitting: Medical

## 2019-11-25 ENCOUNTER — Encounter: Payer: Self-pay | Admitting: Physical Medicine and Rehabilitation

## 2019-11-25 ENCOUNTER — Other Ambulatory Visit: Payer: BC Managed Care – PPO

## 2019-11-25 NOTE — Telephone Encounter (Signed)
Opened to review between patents. He had request for ha medication called in. Could not think of good option presently. Had to get back in room with next pt. Hannah sent me message I advised urgent care evaluation since likely best to do neuro exam. Toradol through urgent care option usually as well.

## 2019-11-25 NOTE — Telephone Encounter (Signed)
Patient caregiver called stating they want something sent in for headaches to hold patient over until [place] appointment.

## 2019-11-25 NOTE — Progress Notes (Signed)
[facility]    Provider:  Dr Jaynee Eagles Requesting Provider: Elise Benne Primary Care Provider:  [name], PA-C  CC:  Neck pain and headache  Interval history 11/26/2019: He had an epidural injection and the symptoms completely resolved for over 6 months so I am not sure why he is here and not back at Dr. Romona Curls office. I don;t provide epidural steroid injections. The occipital headache also went away so appears the ESI appears to help both and he states he is completely assymptomatic after the ESI. Tried a TENS machine.  His partner is here and agrees patient was completely asymptomatic from the neck pain and the headache after the epidural steroid injection from Dr. Ernestina Patches who he thought was great.  We discussed I think that he needs to go back to Dr. Ernestina Patches, I did get in contact with Dr. Ernestina Patches and his office and they will reach out to patient to schedule him.  We also talked about things we can do in the meantime, I will provide occipital nerve blocks today, and 1 prescription of opioids until he can get through and see Dr. Ernestina Patches.  Patient complains of symptoms per HPI as well as the following symptoms: neck pain, headache back of head . Pertinent negatives and positives per HPI. All others negative   HPI:  Angel [name] is a 64 y.o. male here as requested by [name], PA-C for headache. Pain is in the back of the head on the left side and resolves with cervical epidural.Headaches start and escalate very quickly can be 10/10 and he goes to the emergency room, has been there multiple times in a week. Starts on the left in the back of the head, not pounding or throbbing, burning and pressure and then it spreads to the top of the head and worsens and then behind the eye. Can last days continuously. No pound or throb. He does not have sensitivity to light. No nausea. No autonomic symptoms. And he loses his balance. Very agitated when it happens, gets angry,  talks very fast and pounds. Started about a year ago. The shots significantly improve the headache for a year. Epidural steroid injections make the headache go away for a year. He had another shot in November and it helped but he had another headache. Reglan will stop them for a few days. No snoring. No other focal neurologic deficits, associated symptoms, inciting events or modifiable factors.   Reviewed notes, labs and imaging from outside physicians, which showed:  Personally reviewed : 1. 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 This is felt to be incidental in nature, and of doubtful clinical significance. 2. Otherwise normal brain MRI.  No acute intracranial abnormality.  Review of Systems: Patient complains of symptoms per HPI as well as the following symptoms: headache. Pertinent negatives and positives per HPI. All others negative.   Social History   Socioeconomic History  . Marital status: Married    Spouse name: Angel [name]  . Number of children: 0  . Years of education: Not on file  . Highest education level: Master's degree (e.g., MA, MS, MEng, MEd, MSW, MBA)  Occupational History  . Occupation: retired    Fish farm manager: NOT EMPLOYED  Tobacco Use  . Smoking status: Former Smoker    Types: Cigarettes    Quit date: 05/08/2009    Years since quitting: 10.5  . Smokeless tobacco: Never Used  Vaping Use  . Vaping Use: Never  used  Substance and Sexual Activity  . Alcohol use: No    Alcohol/week: 0.0 standard drinks  . Drug use: No  . Sexual activity: Not Currently  Other Topics Concern  . Not on file  Social History Narrative   Patient is right-handed. He is married to same sex partner. He gets little exercise.   Caffeine: 4 glasses of tea max/day   Social Determinants of Health   Financial Resource Strain:   . Difficulty of Paying Living Expenses:   Food Insecurity:   . Worried About Charity fundraiser in the Last Year:   .  Arboriculturist in the Last Year:   Transportation Needs:   . Film/video editor (Medical):   Marland Kitchen Lack of Transportation (Non-Medical):   Physical Activity:   . Days of Exercise per Week:   . Minutes of Exercise per Session:   Stress:   . Feeling of Stress :   Social Connections:   . Frequency of Communication with Friends and Family:   . Frequency of Social Gatherings with Friends and Family:   . Attends Religious Services:   . Active Member of Clubs or Organizations:   . Attends Archivist Meetings:   Marland Kitchen Marital Status:   Intimate Partner Violence:   . Fear of Current or Ex-Partner:   . Emotionally Abused:   Marland Kitchen Physically Abused:   . Sexually Abused:     Family History  Problem Relation Age of Onset  . Heart disease Mother   . Diabetes Father   . Kidney failure Father   . Kidney disease Father   . Other Sister        GERD  . Colon cancer Neg Hx   . Rectal cancer Neg Hx   . Stomach cancer Neg Hx   . Esophageal cancer Neg Hx   . Headache Neg Hx   . Migraines Neg Hx     Past Medical History:  Diagnosis Date  . Bipolar disorder ([place])   . 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 Depression   . GERD (gastroesophageal reflux disease)   . Headache   . Hepatitis B    Hepatitis E. antigen positive by history, status post treatment with Hepsera and baraclude   . Hepatitis B carrier ([place])   . Personal history of colonic polyps    Adenomas polyp 2008 and 2010, 2015   . Weight loss    Pt. has lost 10 lbs since pre-visit, intentionally with diet and walking    Patient Active Problem List   Diagnosis Date Noted  . Occipital neuralgia of left side 04/10/2019  . Hyperglycemia 10/07/2013  . Hepatic cirrhosis ([place]) 10/03/2013  . Bipolar I disorder, most recent episode (or current) depressed, unspecified 05/21/2013  . Hepatitis B 03/21/2013  . Esophageal reflux 03/21/2013  . Personal history of colonic polyps 03/21/2013  . Bipolar disorder, unspecified  ([place]) 09/03/2011    Past Surgical History:  Procedure Laterality Date  . APPENDECTOMY    . COLONOSCOPY    . POLYPECTOMY    . UPPER GASTROINTESTINAL ENDOSCOPY      Current Outpatient Medications  Medication Sig Dispense Refill  . divalproex (DEPAKOTE ER) 250 MG 24 hr tablet 1 bid 60 tablet 5  . lithium 600 MG capsule TAKE 1 CAPSULE BY MOUTH EVERY MORNING AND 2 CAPSULES EVERY NIGHT AT BEDTIME 90 capsule 5  . Melatonin 10 MG SUBL Place 10 mg under the tongue at bedtime.    Marland Kitchen  methocarbamol (ROBAXIN) 750 MG tablet Take 1 tablet (750 mg total) by mouth every 8 (eight) hours as needed for muscle spasms. 90 tablet 0  . nadolol (CORGARD) 40 MG tablet TAKE 1 TABLET BY MOUTH EVERY DAY 30 tablet 0  . bisacodyl (BISACODYL) 5 MG EC tablet TAKE 1 TABLET BY MOUTH EVERY DAY AS NEEDED FOR MODERATE CONSTIPATION 90 tablet 2  . methylPREDNISolone (MEDROL DOSEPAK) 4 MG TBPK tablet follow package directions (Patient not taking: Reported on 11/26/2019) 21 tablet 1  . oxycodone (OXY-IR) 5 MG capsule Take 1 capsule (5 mg total) by mouth every 4 (four) hours as needed. 30 capsule 0   No current facility-administered medications for this visit.    Allergies as of 11/26/2019 - Review Complete 11/26/2019  Allergen Reaction Noted  . Aripiprazole Other (See Comments) 03/15/2011  . Invega [paliperidone er] Other (See Comments) 05/03/2016  . Abilify [aripiprazole]  03/21/2013  . Lamictal [lamotrigine] Rash 03/15/2011  . Lexapro [escitalopram oxalate] Other (See Comments) 03/15/2011    Vitals: BP 136/72   Pulse 72   Ht 5\' 10"  (1.778 m)   Wt 245 lb (111.1 kg)   BMI 35.15 kg/m  Last Weight:  Wt Readings from Last 1 Encounters:  11/26/19 249 lb (112.9 kg)   Last Height:   Ht Readings from Last 1 Encounters:  11/26/19 5\' 10"  (1.778 m)   Physical exam: Exam: Gen: NAD, conversant, well nourised, obese, well groomed                     Neuro: Detailed Neurologic Exam  Speech:    Speech is normal; fluent  and spontaneous with normal comprehension.  Cognition:    The patient is oriented to person, place, and time;     recent and remote memory intact;     language fluent;     normal attention, concentration,     fund of knowledge Cranial Nerves:    The pupils are equal, round, and reactive to light.. Visual fields are full to finger confrontation. Extraocular movements are intact. Trigeminal sensation is intact and the muscles of mastication are normal. The face is symmetric. The palate elevates in the midline. Hearing intact. Voice is normal. Shoulder shrug is normal. The tongue has normal motion without fasciculations.   Motor Observation:    No asymmetry, no atrophy, and no involuntary movements noted. Tone:    Normal muscle tone.    Posture:    Posture is normal. normal erect    Strength:    Strength is V/V in the upper and lower limbs.      Sensation: intact to LT        Assessment/Plan: This is a really lovely patient here with his partner.  Patient has pain in the left posterior occipital area radiating up to the top of his head.  No migrainous features.  No autonomic features.  It is aggravated by turning his head.   Also has radiating neck pain.  - Cervical epidural injections helped significantly, and he was "100%" neck andheadache free for 7+ months. Needs to have these injections regularly, loved Dr. Ernestina Patches will send him back, contacted his office and Dr. Ernestina Patches.  - In the meantime will give him occipital nerve blocks today, a medrol dosepak and very limited supply of oxycodone  - In the past referred to physical therapy, stretching, dry needling: left cervical myofascial pain and occipital neuralgia.  - I sent an email to Dr. Ernestina Patches  Performed by Dr. Jaynee Eagles  M.D. Marcaine and lidocaine in  30-gauge needle was used. All procedures a documented blood were medically necessary, reasonable and appropriate based on the patient's history, medical diagnosis and physician opinion.  Verbal informed consent was obtained from the patient, patient was informed of potential risk of procedure, including bruising, bleeding, hematoma formation, infection, muscle weakness, muscle pain, numbness, transient hypertension, transient hyperglycemia and transient insomnia among others. All areas injected were topically clean with isopropyl rubbing alcohol. Nonsterile nonlatex gloves were worn during the procedure.  1. Greater occipital nerve block [phone]). The greater occipital nerve site was identified at the nuchal line medial to the occipital artery. Medication was injected into the left and right occipital nerve areas and suboccipital areas. Patient's condition is associated with inflammation of the greater occipital nerve and associated multiple groups. Injection was deemed medically necessary, reasonable and appropriate. Injection represents a separate and unique surgical service.  No orders of the defined types were placed in this encounter.  Meds ordered this encounter  Medications  . methylPREDNISolone (MEDROL DOSEPAK) 4 MG TBPK tablet    Sig: follow package directions    Dispense:  21 tablet    Refill:  1  . oxycodone (OXY-IR) 5 MG capsule    Sig: Take 1 capsule (5 mg total) by mouth every 4 (four) hours as needed.    Dispense:  30 capsule    Refill:  0    Cc: Saguier, Iris Pert,  Dr. Laurence Spates  Sarina Ill, MD  [facility] [address] [place], [place] [address]  Phone [phone] Fax [phone]  I spent 25 minutes of face-to-face and non-face-to-face time with patient on the  1. Occipital neuralgia of left side   2. Cervicalgia   3. Cervico-occipital neuralgia    diagnosis.  This included previsit chart review, lab review, study review, order entry, electronic health record documentation, patient education on the different diagnostic and therapeutic options, counseling and coordination of care, risks and benefits of  management, compliance, or risk factor reduction.  This does not include time spent on nerve blocks.

## 2019-11-26 ENCOUNTER — Other Ambulatory Visit: Payer: Self-pay | Admitting: Medical

## 2019-11-26 ENCOUNTER — Encounter: Payer: Self-pay | Admitting: Medical

## 2019-11-26 ENCOUNTER — Ambulatory Visit (INDEPENDENT_AMBULATORY_CARE_PROVIDER_SITE_OTHER): Payer: BC Managed Care – PPO | Admitting: Medical

## 2019-11-26 ENCOUNTER — Other Ambulatory Visit: Payer: Self-pay

## 2019-11-26 ENCOUNTER — Ambulatory Visit (INDEPENDENT_AMBULATORY_CARE_PROVIDER_SITE_OTHER): Payer: BC Managed Care – PPO | Admitting: Neurology

## 2019-11-26 ENCOUNTER — Encounter: Payer: Self-pay | Admitting: Neurology

## 2019-11-26 VITALS — BP 136/72 | HR 72 | Ht 70.0 in | Wt 245.0 lb

## 2019-11-26 VITALS — BP 142/80 | HR 95 | Resp 20 | Ht 70.0 in | Wt 249.0 lb

## 2019-11-26 DIAGNOSIS — M542 Cervicalgia: Secondary | ICD-10-CM

## 2019-11-26 DIAGNOSIS — M5481 Occipital neuralgia: Secondary | ICD-10-CM

## 2019-11-26 DIAGNOSIS — F319 Bipolar disorder, unspecified: Secondary | ICD-10-CM | POA: Diagnosis not present

## 2019-11-26 DIAGNOSIS — R519 Headache, unspecified: Secondary | ICD-10-CM

## 2019-11-26 MED ORDER — METHYLPREDNISOLONE 4 MG PO TBPK: ORAL_TABLET | ORAL | 1 refills | Status: DC

## 2019-11-26 MED ORDER — OXYCODONE HCL 5 MG PO CAPS: 5.0000 mg | ORAL_CAPSULE | ORAL | 0 refills | Status: DC | PRN

## 2019-11-26 MED ORDER — KETOROLAC TROMETHAMINE 60 MG/2ML IM SOLN
60.0000 mg | Freq: Once | INTRAMUSCULAR | Status: AC
  Administered 2019-11-26: 60 mg via INTRAMUSCULAR

## 2019-11-26 NOTE — Patient Instructions (Addendum)
You occipital neuralgia with ha, we gave you toradol 30 mg im. Can use oxycodone tonight if ha lingers. Can start medrol tomorrow. Hopefully you will be able to see Dr. Ernestina Patches soon.   Regarding your b blocker concerns, I do think benefit exceeds any side effects in light of your esophageal varices history. Keep checking your pulse. If pulse not in 40's and your pulse still rises with acitivity then would not be concerned.  Get labs scheduled as we discussed.  Follow up date to be determined after lab review.

## 2019-11-26 NOTE — Progress Notes (Signed)
Nerve block(w/o) steroid: Pt signed consent- Yes  0.5% Bupivocaine 3 mL LOT: [id number] EXP: 11/30/2019 NMM:7680-8811-03  2% Lidocaine   48mL PRX:[id number] EXP: 11/2020 NDC: 24462-863-81

## 2019-11-26 NOTE — Patient Instructions (Signed)
Steroid dosepak Oxycodone as needed Occipital nerve block today F/u with Dr. Ernestina Patches for epidural injections  Occipital Nerve Block Patient Information  Description: The occipital nerves originate in the cervical (neck) spinal cord and travel upward through muscle and tissue to supply sensation to the back of the head and top of the scalp.  In addition, the nerves control some of the muscles of the scalp.  Occipital neuralgia is an irritation of these nerves which can cause headaches, numbness of the scalp, and neck discomfort.     The occipital nerve block will interrupt nerve transmission through these nerves and can relieve pain and spasm.  The block consists of insertion of a small needle under the skin in the back of the head to deposit local anesthetic (numbing medicine) and/or steroids around the nerve.  The entire block usually lasts less than 5 minutes.  Conditions which may be treated by occipital blocks:   Muscular pain and spasm of the scalp  Nerve irritation, back of the head  Headaches  Upper neck pain  Preparation for the injection:  1. Do not eat any solid food or dairy products within 8 hours of your appointment. 2. You may drink clear liquids up to 3 hours before appointment.  Clear liquids include water, black coffee, juice or soda.  No milk or cream please. 3. You may take your regular medication, including pain medications, with a sip of water before you appointment.  Diabetics should hold regular insulin (if taken separately) and take 1/2 normal NPH dose the morning of the procedure.  Carry some sugar containing items with you to your appointment. 4. A driver must accompany you and be prepared to drive you home after your procedure. 5. Bring all your current medications with you. 6. An IV may be inserted and sedation may be given at the discretion of the physician. 7. A blood pressure cuff, EKG, and other monitors will often be applied during the procedure.  Some  patients may need to have extra oxygen administered for a short period. 8. You will be asked to provide medical information, including your allergies and medications, prior to the procedure.  We must know immediately if you are taking blood thinners (like Coumadin/Warfarin) or if you are allergic to IV iodine contrast (dye).  We must know if you could possible be pregnant.  9. Do not wear a high collared shirt or turtleneck.  Tie long hair up in the back if possible.  Possible side-effects:   Bleeding from needle site  Infection (rare, may require surgery)  Nerve injury (rare)  Hair on back of neck can be tinged with iodine scrub (this will wash out)  Light-headedness (temporary)  Pain at injection site (several days)  Decreased blood pressure (rare, temporary)  Seizure (very rare)  Call if you experience:   Hives or difficulty breathing ( go to the emergency room)  Inflammation or drainage at the injection site(s)  Please note:  Although the local anesthetic injected can often make your painful muscles or headache feel good for several hours after the injection, the pain may return.  It takes 3-7 days for steroids to work.  You may not notice any pain relief for at least one week.  If effective, we will often do a series of injections spaced 3-6 weeks apart to maximally decrease your pain.  If you have any questions, please call [phone] [facility] Oxycodone tablets or capsules What is this medicine? OXYCODONE (ox  i KOE done) is a pain reliever. It is used to treat moderate to severe pain. This medicine may be used for other purposes; ask your health care provider or pharmacist if you have questions. COMMON BRAND NAME(S): Dazidox, Endocodone, Oxaydo, OXECTA, OxyIR, Percolone, Roxicodone, Roxybond What should I tell my health care provider before I take this medicine? They need to know if you have any of these  conditions:  Addison's disease  brain tumor  head injury  heart disease  history of drug or alcohol abuse problem  if you often drink alcohol  kidney disease  liver disease  lung or breathing disease, like asthma  mental illness  pancreatic disease  seizures  thyroid disease  an unusual or allergic reaction to oxycodone, codeine, hydrocodone, morphine, other medicines, foods, dyes, or preservatives  pregnant or trying to get pregnant  breast-feeding How should I use this medicine? Take this medicine by mouth with a glass of water. Follow the directions on the prescription label. You can take it with or without food. If it upsets your stomach, take it with food. Take your medicine at regular intervals. Do not take it more often than directed. Do not stop taking except on your doctor's advice. Some brands of this medicine, like Oxecta, have special instructions. Ask your doctor or pharmacist if these directions are for you: Do not cut, crush or chew this medicine. Swallow only one tablet at a time. Do not wet, soak, or lick the tablet before you take it. A special MedGuide will be given to you by the pharmacist with each prescription and refill. Be sure to read this information carefully each time. Talk to your pediatrician regarding the use of this medicine in children. Special care may be needed. Overdosage: If you think you have taken too much of this medicine contact a poison control center or emergency room at once. NOTE: This medicine is only for you. Do not share this medicine with others. What if I miss a dose? If you miss a dose, take it as soon as you can. If it is almost time for your next dose, take only that dose. Do not take double or extra doses. What may interact with this medicine? This medicine may interact with the following medications:  alcohol  antihistamines for allergy, cough and cold  antiviral medicines for HIV or AIDS  atropine  certain  antibiotics like clarithromycin, erythromycin, linezolid, rifampin  certain medicines for anxiety or sleep  certain medicines for bladder problems like oxybutynin, tolterodine  certain medicines for depression like amitriptyline, fluoxetine, sertraline  certain medicines for fungal infections like ketoconazole, itraconazole, voriconazole  certain medicines for migraine headache like almotriptan, eletriptan, frovatriptan, naratriptan, rizatriptan, sumatriptan, zolmitriptan  certain medicines for nausea or vomiting like dolasetron, ondansetron, palonosetron  certain medicines for Parkinson's disease like benztropine, trihexyphenidyl  certain medicines for seizures like phenobarbital, phenytoin, primidone  certain medicines for stomach problems like dicyclomine, hyoscyamine  certain medicines for travel sickness like scopolamine  diuretics  general anesthetics like halothane, isoflurane, methoxyflurane, propofol  ipratropium  local anesthetics like lidocaine, pramoxine, tetracaine  MAOIs like Carbex, Eldepryl, Marplan, Nardil, and Parnate  medicines that relax muscles for surgery  methylene blue  nilotinib  other narcotic medicines for pain or cough  phenothiazines like chlorpromazine, mesoridazine, prochlorperazine, thioridazine This list may not describe all possible interactions. Give your health care provider a list of all the medicines, herbs, non-prescription drugs, or dietary supplements you use. Also tell them if you smoke, drink alcohol,  or use illegal drugs. Some items may interact with your medicine. What should I watch for while using this medicine? Tell your health care provider if your pain does not go away, if it gets worse, or if you have new or a different type of pain. You may develop tolerance to this drug. Tolerance means that you will need a higher dose of the drug for pain relief. Tolerance is normal and is expected if you take this drug for a long  time. There are different types of narcotic drugs (opioids) for pain. If you take more than one type at the same time, you may have more side effects. Give your health care provider a list of all drugs you use. He or she will tell you how much drug to take. Do not take more drug than directed. Get emergency help right away if you have problems breathing. Do not suddenly stop taking your drug because you may develop a severe reaction. Your body becomes used to the drug. This does NOT mean you are addicted. Addiction is a behavior related to getting and using a drug for a nonmedical reason. If you have pain, you have a medical reason to take pain drug. Your health care provider will tell you how much drug to take. If your health care provider wants you to stop the drug, the dose will be slowly lowered over time to avoid any side effects. Talk to your health care provider about naloxone and how to get it. Naloxone is an emergency drug used for an opioid overdose. An overdose can happen if you take too much opioid. It can also happen if an opioid is taken with some other drugs or substances, like alcohol. Know the symptoms of an overdose, like trouble breathing, unusually tired or sleepy, or not being able to respond or wake up. Make sure to tell caregivers and close contacts where it is stored. Make sure they know how to use it. After naloxone is given, you must get emergency help right away. Naloxone is a temporary treatment. Repeat doses may be needed. You may get drowsy or dizzy. Do not drive, use machinery, or do anything that needs mental alertness until you know how this drug affects you. Do not stand up or sit up quickly, especially if you are an older patient. This reduces the risk of dizzy or fainting spells. Alcohol may interfere with the effect of this drug. Avoid alcoholic drinks. This drug will cause constipation. If you do not have a bowel movement for 3 days, call your health care provider. Your  mouth may get dry. Chewing sugarless gum or sucking hard candy and drinking plenty of water may help. Contact your health care provider if the problem does not go away or is severe. The tablet shell for some brands of this drug does not dissolve. This is normal. The tablet shell may appear whole in the stool. This is not a cause for concern. What side effects may I notice from receiving this medicine? Side effects that you should report to your doctor or health care professional as soon as possible:  allergic reactions like skin rash, itching or hives, swelling of the face, lips, or tongue  breathing problems  confusion  signs and symptoms of low blood pressure like dizziness; feeling faint or lightheaded, falls; unusually weak or tired  trouble passing urine or change in the amount of urine  trouble swallowing Side effects that usually do not require medical attention (report to your doctor  or health care professional if they continue or are bothersome):  constipation  dry mouth  nausea, vomiting  tiredness This list may not describe all possible side effects. Call your doctor for medical advice about side effects. You may report side effects to FDA at 1-800-FDA-1088. Where should I keep my medicine? Keep out of the reach of children. This medicine can be abused. Keep your medicine in a safe place to protect it from theft. Do not share this medicine with anyone. Selling or giving away this medicine is dangerous and against the law. Store at room temperature between 15 and 30 degrees C (59 and 86 degrees F). Protect from light. Keep container tightly closed. This medicine may cause harm and death if it is taken by other adults, children, or pets. Return medicine that has not been used to an official disposal site. Contact the DEA at 915-206-3645 or your city/county government to find a site. If you cannot return the medicine, flush it down the toilet. Do not use the medicine after the  expiration date. NOTE: This sheet is a summary. It may not cover all possible information. If you have questions about this medicine, talk to your doctor, pharmacist, or health care provider.  2020 [employer]vier/Gold Standard (2018-12-03 12:47:59) Methylprednisolone tablets What is this medicine? METHYLPREDNISOLONE (meth ill pred NISS oh lone) is a corticosteroid. It is commonly used to treat inflammation of the skin, joints, lungs, and other organs. Common conditions treated include asthma, allergies, and arthritis. It is also used for other conditions, such as blood disorders and diseases of the adrenal glands. This medicine may be used for other purposes; ask your health care provider or pharmacist if you have questions. COMMON BRAND NAME(S): Medrol, Medrol Dosepak What should I tell my health care provider before I take this medicine? They need to know if you have any of these conditions:  Cushing's syndrome  eye disease, vision problems  diabetes  glaucoma  heart disease  high blood pressure  infection (especially a virus infection such as chickenpox, cold sores, or herpes)  liver disease  mental illness  myasthenia gravis  osteoporosis  recently received or scheduled to receive a vaccine  seizures  stomach or intestine problems  thyroid disease  an unusual or allergic reaction to lactose, methylprednisolone, other medicines, foods, dyes, or preservatives  pregnant or trying to get pregnant  breast-feeding How should I use this medicine? Take this medicine by mouth with a glass of water. Follow the directions on the prescription label. Take this medicine with food. If you are taking this medicine once a day, take it in the morning. Do not take it more often than directed. Do not suddenly stop taking your medicine because you may develop a severe reaction. Your doctor will tell you how much medicine to take. If your doctor wants you to stop the medicine, the dose may be  slowly lowered over time to avoid any side effects. Talk to your pediatrician regarding the use of this medicine in children. Special care may be needed. Overdosage: If you think you have taken too much of this medicine contact a poison control center or emergency room at once. NOTE: This medicine is only for you. Do not share this medicine with others. What if I miss a dose? If you miss a dose, take it as soon as you can. If it is almost time for your next dose, talk to your doctor or health care professional. Dennis Bast may need to miss a dose or  take an extra dose. Do not take double or extra doses without advice. What may interact with this medicine? Do not take this medicine with any of the following medications:  alefacept  echinacea  live virus vaccines  metyrapone  mifepristone This medicine may also interact with the following medications:  amphotericin B  aspirin and aspirin-like medicines  certain antibiotics like erythromycin, clarithromycin, troleandomycin  certain medicines for diabetes  certain medicines for fungal infections like ketoconazole  certain medicines for seizures like carbamazepine, phenobarbital, phenytoin  certain medicines that treat or prevent blood clots like warfarin  cholestyramine  cyclosporine  digoxin  diuretics  male hormones, like estrogens and birth control pills  isoniazid  NSAIDs, medicines for pain inflammation, like ibuprofen or naproxen  other medicines for myasthenia gravis  rifampin  vaccines This list may not describe all possible interactions. Give your health care provider a list of all the medicines, herbs, non-prescription drugs, or dietary supplements you use. Also tell them if you smoke, drink alcohol, or use illegal drugs. Some items may interact with your medicine. What should I watch for while using this medicine? Tell your doctor or healthcare professional if your symptoms do not start to get better or if they  get worse. Do not stop taking except on your doctor's advice. You may develop a severe reaction. Your doctor will tell you how much medicine to take. This medicine may increase your risk of getting an infection. Tell your doctor or health care professional if you are around anyone with measles or chickenpox, or if you develop sores or blisters that do not heal properly. This medicine may increase blood sugar levels. Ask your healthcare provider if changes in diet or medicines are needed if you have diabetes. Tell your doctor or health care professional right away if you have any change in your eyesight. Using this medicine for a long time may increase your risk of low bone mass. Talk to your doctor about bone health. What side effects may I notice from receiving this medicine? Side effects that you should report to your doctor or health care professional as soon as possible:  allergic reactions like skin rash, itching or hives, swelling of the face, lips, or tongue  bloody or tarry stools  hallucination, loss of contact with reality  muscle cramps  muscle pain  palpitations  signs and symptoms of high blood sugar such as being more thirsty or hungry or having to urinate more than normal. You may also feel very tired or have blurry vision.  signs and symptoms of infection like fever or chills; cough; sore throat; pain or trouble passing urine Side effects that usually do not require medical attention (report to your doctor or health care professional if they continue or are bothersome):  changes in emotions or mood  constipation  diarrhea  excessive hair growth on the face or body  headache  nausea, vomiting  trouble sleeping  weight gain This list may not describe all possible side effects. Call your doctor for medical advice about side effects. You may report side effects to FDA at 1-800-FDA-1088. Where should I keep my medicine? Keep out of the reach of children. Store at  room temperature between 20 and 25 degrees C (68 and 77 degrees F). Throw away any unused medicine after the expiration date. NOTE: This sheet is a summary. It may not cover all possible information. If you have questions about this medicine, talk to your doctor, pharmacist, or health care provider.  2020 Elsevier/Gold Standard (2018-01-24 09:19:36)

## 2019-11-26 NOTE — Progress Notes (Signed)
Subjective:    Patient ID: Angel [name], male    DOB: 1956/01/29, 64 y.o.   MRN: [id number]  HPI  Pt in for follow up.  Pt did see Dr. Jaynee Eagles today. He did get injection today for occipital neuralgia. Also gave prednisone to start.  Dr. Jaynee Eagles A/P below  Patient has pain in the left posterior occipital area radiating up to the top of his head.  No migrainous features.  No autonomic features.  It is aggravated by turning his head.     - Cervical epidural injections helped significantly, and he was "100%" neck andheadache free for 7+ months. Needs to have these injections regularly, loved Dr. Ernestina Patches will send him back  - In the meantime will give him occipital nerve blocks today, a medrol dosepak and very limited supply of oxycodone  - In the past referred to physical therapy, stretching, dry needling: left cervical myofascial pain and occipital neuralgia.   Our office at [facility] gave you toradol 30 mg IM. His ha and neck pain is already decreasing some after Dr. Jaynee Eagles treatment. Pt has not picked up medrol or oxycodone.  Pt pulse is 95. Usually bp is 50-60 range. Pt is on b blocker. Pt has esophageal varices. Told by prior GI MD to stay on b-blocker. He never reports pulse in 40's.     Review of Systems  Constitutional: Negative for chills, fatigue and fever.  Respiratory: Negative for cough, chest tightness, shortness of breath and wheezing.   Cardiovascular: Negative for chest pain and palpitations.  Gastrointestinal: Negative for abdominal pain.  Genitourinary: Negative for dysuria and flank pain.  Musculoskeletal: Negative for back pain.  Skin: Negative for rash.  Neurological: Positive for headaches. Negative for dizziness, tremors, seizures, syncope, speech difficulty, weakness, light-headedness and numbness.  Hematological: Negative for adenopathy.  Psychiatric/Behavioral: Negative for behavioral problems.    Past Medical History:  Diagnosis Date  . Bipolar  disorder ([place])   . 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 Depression   . GERD (gastroesophageal reflux disease)   . Headache   . Hepatitis B    Hepatitis E. antigen positive by history, status post treatment with Hepsera and baraclude   . Hepatitis B carrier ([place])   . Personal history of colonic polyps    Adenomas polyp 2008 and 2010, 2015   . Weight loss    Pt. has lost 10 lbs since pre-visit, intentionally with diet and walking     Social History   Socioeconomic History  . Marital status: Married    Spouse name: [name]  . Number of children: 0  . Years of education: Not on file  . Highest education level: Master's degree (e.g., MA, MS, MEng, MEd, MSW, MBA)  Occupational History  . Occupation: retired    Fish farm manager: NOT EMPLOYED  Tobacco Use  . Smoking status: Former Smoker    Types: Cigarettes    Quit date: 05/08/2009    Years since quitting: 10.5  . Smokeless tobacco: Never Used  Vaping Use  . Vaping Use: Never used  Substance and Sexual Activity  . Alcohol use: No    Alcohol/week: 0.0 standard drinks  . Drug use: No  . Sexual activity: Not Currently  Other Topics Concern  . Not on file  Social History Narrative   Patient is right-handed. He is married to same sex partner. He gets little exercise.   Caffeine: 4 glasses of tea max/day   Social Determinants of Health  Financial Resource Strain:   . Difficulty of Paying Living Expenses:   Food Insecurity:   . Worried About Charity fundraiser in the Last Year:   . Arboriculturist in the Last Year:   Transportation Needs:   . Film/video editor (Medical):   Marland Kitchen Lack of Transportation (Non-Medical):   Physical Activity:   . Days of Exercise per Week:   . Minutes of Exercise per Session:   Stress:   . Feeling of Stress :   Social Connections:   . Frequency of Communication with Friends and Family:   . Frequency of Social Gatherings with Friends and Family:   . Attends Religious Services:   .  Active Member of Clubs or Organizations:   . Attends Archivist Meetings:   Marland Kitchen Marital Status:   Intimate Partner Violence:   . Fear of Current or Ex-Partner:   . Emotionally Abused:   Marland Kitchen Physically Abused:   . Sexually Abused:     Past Surgical History:  Procedure Laterality Date  . APPENDECTOMY    . COLONOSCOPY    . POLYPECTOMY    . UPPER GASTROINTESTINAL ENDOSCOPY      Family History  Problem Relation Age of Onset  . Heart disease Mother   . Diabetes Father   . Kidney failure Father   . Kidney disease Father   . Other Sister        GERD  . Colon cancer Neg Hx   . Rectal cancer Neg Hx   . Stomach cancer Neg Hx   . Esophageal cancer Neg Hx   . Headache Neg Hx   . Migraines Neg Hx     Allergies  Allergen Reactions  . Aripiprazole Other (See Comments)    Tardive Dyskinesia  . Invega [Paliperidone Er] Other (See Comments)    Tardive dyskinsia symptoms  . Abilify [Aripiprazole]   . Lamictal [Lamotrigine] Rash  . Lexapro [Escitalopram Oxalate] Other (See Comments)    Doesn't Work    Current Outpatient Medications on File Prior to Visit  Medication Sig Dispense Refill  . bisacodyl (BISACODYL) 5 MG EC tablet TAKE 1 TABLET BY MOUTH DAILY AS NEEDED FOR CONSTIPATION 30 tablet 1  . divalproex (DEPAKOTE ER) 250 MG 24 hr tablet 1 bid 60 tablet 5  . lithium 600 MG capsule TAKE 1 CAPSULE BY MOUTH EVERY MORNING AND 2 CAPSULES EVERY NIGHT AT BEDTIME 90 capsule 5  . Melatonin 10 MG SUBL Place 10 mg under the tongue at bedtime.    . methocarbamol (ROBAXIN) 750 MG tablet Take 1 tablet (750 mg total) by mouth every 8 (eight) hours as needed for muscle spasms. 90 tablet 0  . nadolol (CORGARD) 40 MG tablet TAKE 1 TABLET BY MOUTH EVERY DAY 30 tablet 0  . oxycodone (OXY-IR) 5 MG capsule Take 1 capsule (5 mg total) by mouth every 4 (four) hours as needed. 30 capsule 0  . methylPREDNISolone (MEDROL DOSEPAK) 4 MG TBPK tablet follow package directions (Patient not taking: Reported  on 11/26/2019) 21 tablet 1  . venlafaxine XR (EFFEXOR-XR) 150 MG 24 hr capsule TAKE 1 CAPSULE(150 MG) BY MOUTH EVERY MORNING 30 capsule 4   No current facility-administered medications on file prior to visit.    BP (!) 142/80 (BP Location: Left Arm)   Pulse 95   Resp 20   Ht 5\' 10"  (1.778 m)   Wt 249 lb (112.9 kg)   SpO2 95%   BMI 35.73 kg/m  Objective:   Physical Exam  General Mental Status- Alert. General Appearance- Not in acute distress.   Skin General: Color- Normal Color. Moisture- Normal Moisture.  Neck Carotid Arteries- Normal color. Moisture- Normal Moisture. No carotid bruits. No JVD. Mild trapezius tenderness  Chest and Lung Exam Auscultation: Breath Sounds:-Normal.  Cardiovascular Auscultation:Rythm- Regular. Murmurs & Other Heart Sounds:Auscultation of the heart reveals- No Murmurs.  Abdomen Inspection:-Inspeection Normal. Palpation/Percussion:Note:No mass. Palpation and Percussion of the abdomen reveal- Non Tender, Non Distended + BS, no rebound or guarding.    Neurologic Cranial Nerve exam:- CN III-XII intact(No nystagmus), symmetric smile. Finger to Nose:- Normal/Intact Strength:- 5/5 equal and symmetric strength both upper and lower extremities.      Assessment & Plan:  You occipital neuralgia with ha, we gave you toradol 30 mg im. Can use oxycodone tonight if ha lingers. Can start medrol tomorrow. Hopefully you will be able to see Dr. Ernestina Patches soon.   Regarding your b blocker concerns, I do think benefit exceeds any side effects in light of your esophageal varices history. Keep checking your pulse. If pulse not in 40's and your pulse still rises with acitivity then would not be concerned.  Get labs scheduled as we discussed.  Follow up date to be determined after lab review.  [name], PA-C    Time spent with patient today was 35   minutes which consisted of chart review, discussing diagnosis,  treatment and documentation.

## 2019-11-27 ENCOUNTER — Encounter: Payer: Self-pay | Admitting: Neurology

## 2019-11-27 ENCOUNTER — Telehealth: Payer: Self-pay | Admitting: Physical Medicine and Rehabilitation

## 2019-11-27 NOTE — Telephone Encounter (Signed)
C7-T1 IL. ? Blood thinners, ? Auth.

## 2019-11-27 NOTE — Telephone Encounter (Signed)
Patient called requesting a call back for an appt. Patient needs to set appt for back injections. Dr Jaynee Eagles. Patient phone number is [phone].

## 2019-11-28 ENCOUNTER — Telehealth: Payer: Self-pay | Admitting: Physical Medicine and Rehabilitation

## 2019-11-28 ENCOUNTER — Telehealth: Payer: Self-pay | Admitting: Neurology

## 2019-11-28 ENCOUNTER — Other Ambulatory Visit: Payer: Self-pay

## 2019-11-28 ENCOUNTER — Other Ambulatory Visit (INDEPENDENT_AMBULATORY_CARE_PROVIDER_SITE_OTHER): Payer: BC Managed Care – PPO

## 2019-11-28 DIAGNOSIS — R350 Frequency of micturition: Secondary | ICD-10-CM | POA: Diagnosis not present

## 2019-11-28 DIAGNOSIS — R635 Abnormal weight gain: Secondary | ICD-10-CM

## 2019-11-28 DIAGNOSIS — R61 Generalized hyperhidrosis: Secondary | ICD-10-CM | POA: Diagnosis not present

## 2019-11-28 DIAGNOSIS — Z125 Encounter for screening for malignant neoplasm of prostate: Secondary | ICD-10-CM | POA: Diagnosis not present

## 2019-11-28 LAB — POC URINALSYSI DIPSTICK (AUTOMATED)
Bilirubin, UA: NEGATIVE
Blood, UA: NEGATIVE
Glucose, UA: NEGATIVE
Ketones, UA: NEGATIVE
Leukocytes, UA: NEGATIVE
Nitrite, UA: NEGATIVE
Protein, UA: NEGATIVE
Spec Grav, UA: 1.01 (ref 1.010–1.025)
Urobilinogen, UA: 0.2 E.U./dL
pH, UA: 6 (ref 5.0–8.0)

## 2019-11-28 LAB — COMPREHENSIVE METABOLIC PANEL
ALT: 37 U/L (ref 0–53)
AST: 24 U/L (ref 0–37)
Albumin: 4.2 g/dL (ref 3.5–5.2)
Alkaline Phosphatase: 71 U/L (ref 39–117)
BUN: 14 mg/dL (ref 6–23)
CO2: 30 mEq/L (ref 19–32)
Calcium: 9.3 mg/dL (ref 8.4–10.5)
Chloride: 104 mEq/L (ref 96–112)
Creatinine, Ser: 0.81 mg/dL (ref 0.40–1.50)
GFR: 95.71 mL/min (ref >60.00)
Glucose, Bld: 239 mg/dL — ABNORMAL HIGH (ref 70–99)
Potassium: 4.3 mEq/L (ref 3.5–5.1)
Sodium: 139 mEq/L (ref 135–145)
Total Bilirubin: 0.3 mg/dL (ref 0.2–1.2)
Total Protein: 6.8 g/dL (ref 6.0–8.3)

## 2019-11-28 LAB — CBC WITH DIFFERENTIAL/PLATELET
Basophils Absolute: 0 10*3/uL (ref 0.0–0.1)
Basophils Relative: 0.2 % (ref 0.0–3.0)
Eosinophils Absolute: 0 10*3/uL (ref 0.0–0.7)
Eosinophils Relative: 0.2 % (ref 0.0–5.0)
HCT: 39.6 % (ref 39.0–52.0)
Hemoglobin: 13 g/dL (ref 13.0–17.0)
Lymphocytes Relative: 14.4 % (ref 12.0–46.0)
Lymphs Abs: 0.8 10*3/uL (ref 0.7–4.0)
MCHC: 32.8 g/dL (ref 30.0–36.0)
MCV: 98.5 fl (ref 78.0–100.0)
Monocytes Absolute: 0.4 10*3/uL (ref 0.1–1.0)
Monocytes Relative: 6.9 % (ref 3.0–12.0)
Neutro Abs: 4.5 10*3/uL (ref 1.4–7.7)
Neutrophils Relative %: 78.3 % — ABNORMAL HIGH (ref 43.0–77.0)
Platelets: 84 10*3/uL — ABNORMAL LOW (ref 150.0–400.0)
RBC: 4.02 Mil/uL — ABNORMAL LOW (ref 4.22–5.81)
RDW: 14.5 % (ref 11.5–15.5)
WBC: 5.8 10*3/uL (ref 4.0–10.5)

## 2019-11-28 LAB — PSA: PSA: 1.9 ng/mL (ref 0.10–4.00)

## 2019-11-28 LAB — TSH: TSH: 0.72 u[IU]/mL (ref 0.35–4.50)

## 2019-11-28 NOTE — Telephone Encounter (Signed)
Patient reach on-call physician for medication questions, I failed to reach him, left message

## 2019-11-28 NOTE — Progress Notes (Signed)
yellow 

## 2019-11-28 NOTE — Telephone Encounter (Signed)
Is auth needed for [phone]? Patient is scheduled for 8/2.

## 2019-11-28 NOTE — Telephone Encounter (Signed)
Scheduled

## 2019-11-29 LAB — URINE CULTURE
MICRO NUMBER:: 10742631
Result:: NO GROWTH
SPECIMEN QUALITY:: ADEQUATE

## 2019-11-30 ENCOUNTER — Other Ambulatory Visit: Payer: Self-pay

## 2019-11-30 ENCOUNTER — Emergency Department (HOSPITAL_BASED_OUTPATIENT_CLINIC_OR_DEPARTMENT_OTHER)
Admission: EM | Admit: 2019-11-30 | Discharge: 2019-11-30 | Disposition: A | Discharge status: Home / Self Care | Payer: BC Managed Care – PPO | Attending: Emergency Medicine | Admitting: Emergency Medicine

## 2019-11-30 ENCOUNTER — Encounter (HOSPITAL_BASED_OUTPATIENT_CLINIC_OR_DEPARTMENT_OTHER): Payer: Self-pay

## 2019-11-30 DIAGNOSIS — G44209 Tension-type headache, unspecified, not intractable: Secondary | ICD-10-CM

## 2019-11-30 DIAGNOSIS — Z87891 Personal history of nicotine dependence: Secondary | ICD-10-CM | POA: Insufficient documentation

## 2019-11-30 MED ORDER — DEXAMETHASONE 6 MG PO TABS
10.0000 mg | ORAL_TABLET | Freq: Once | ORAL | Status: AC
  Administered 2019-11-30: 10 mg via ORAL
  Filled 2019-11-30: qty 1

## 2019-11-30 MED ORDER — PROCHLORPERAZINE EDISYLATE 10 MG/2ML IJ SOLN
10.0000 mg | Freq: Once | INTRAMUSCULAR | Status: AC
  Administered 2019-11-30: 10 mg via INTRAMUSCULAR
  Filled 2019-11-30: qty 2

## 2019-11-30 MED ORDER — DIPHENHYDRAMINE HCL 50 MG/ML IJ SOLN
25.0000 mg | Freq: Once | INTRAMUSCULAR | Status: AC
  Administered 2019-11-30: 25 mg via INTRAMUSCULAR
  Filled 2019-11-30: qty 1

## 2019-11-30 NOTE — Discharge Instructions (Signed)
Please return for worsening headache one-sided numbness or weakness difficulty with speech or swallowing.  Please call your neurologist tomorrow and discuss how you are feeling.

## 2019-11-30 NOTE — ED Provider Notes (Signed)
[facility] EMERGENCY DEPARTMENT Provider Note   CSN: [id number] Arrival date & time: 11/30/19  1623     History Chief Complaint  Patient presents with  . Headache    Angel [name] is a 64 y.o. male.  64 yo M with a chief complaint of a headache.  This been an ongoing issue for him.  Patient has chronic neck pain which is thought to be causing him to have tension headaches.  Has been getting epidural injections as well as occipital nerve blocks.  He has also been prescribed oxycodone.  Has been taking it but with minimal improvement.  Came in here today as he is almost run out of his headache medicine.  Denies trauma denies fever denies any new kind of headache.  Denies one-sided numbness or weakness denies difficulty speech or swallowing.  The history is provided by the patient and the spouse.  Headache Pain location:  Frontal and occipital Quality:  Dull Radiates to: frontal region from neck. Severity currently:  7/10 Severity at highest:  8/10 Onset quality:  Gradual Duration:  2 weeks Timing:  Constant Progression:  Worsening Chronicity:  Recurrent Similar to prior headaches: yes   Relieved by:  Nothing Worsened by:  Nothing Ineffective treatments:  None tried Associated symptoms: no abdominal pain, no congestion, no diarrhea, no fever, no myalgias and no vomiting        Past Medical History:  Diagnosis Date  . Bipolar disorder ([place])   . 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 Depression   . GERD (gastroesophageal reflux disease)   . Headache   . Hepatitis B    Hepatitis E. antigen positive by history, status post treatment with Hepsera and baraclude   . Hepatitis B carrier ([place])   . Personal history of colonic polyps    Adenomas polyp 2008 and 2010, 2015   . Weight loss    Pt. has lost 10 lbs since pre-visit, intentionally with diet and walking    Patient Active Problem List   Diagnosis Date Noted  . Occipital neuralgia of left side  04/10/2019  . Hyperglycemia 10/07/2013  . Hepatic cirrhosis ([place]) 10/03/2013  . Bipolar I disorder, most recent episode (or current) depressed, unspecified 05/21/2013  . Hepatitis B 03/21/2013  . Esophageal reflux 03/21/2013  . Personal history of colonic polyps 03/21/2013  . Bipolar disorder, unspecified ([place]) 09/03/2011    Past Surgical History:  Procedure Laterality Date  . APPENDECTOMY    . COLONOSCOPY    . POLYPECTOMY    . UPPER GASTROINTESTINAL ENDOSCOPY         Family History  Problem Relation Age of Onset  . Heart disease Mother   . Diabetes Father   . Kidney failure Father   . Kidney disease Father   . Other Sister        GERD  . Colon cancer Neg Hx   . Rectal cancer Neg Hx   . Stomach cancer Neg Hx   . Esophageal cancer Neg Hx   . Headache Neg Hx   . Migraines Neg Hx     Social History   Tobacco Use  . Smoking status: Former Smoker    Types: Cigarettes    Quit date: 05/08/2009    Years since quitting: 10.5  . Smokeless tobacco: Never Used  Vaping Use  . Vaping Use: Never used  Substance Use Topics  . Alcohol use: No    Alcohol/week: 0.0 standard drinks  . Drug use: No  Home Medications Prior to Admission medications   Medication Sig Start Date End Date Taking? Authorizing Provider  bisacodyl (BISACODYL) 5 MG EC tablet TAKE 1 TABLET BY MOUTH EVERY DAY AS NEEDED FOR MODERATE CONSTIPATION 11/27/19   Saguier, Percell Miller, PA-C  divalproex (DEPAKOTE ER) 250 MG 24 hr tablet 1 bid 10/08/19   Plovsky, Berneta Sages, MD  lithium 600 MG capsule TAKE 1 CAPSULE BY MOUTH EVERY MORNING AND 2 CAPSULES EVERY NIGHT AT BEDTIME 10/08/19   Plovsky, Berneta Sages, MD  Melatonin 10 MG SUBL Place 10 mg under the tongue at bedtime.    [provider]  methocarbamol (ROBAXIN) 750 MG tablet Take 1 tablet (750 mg total) by mouth every 8 (eight) hours as needed for muscle spasms. 11/24/19   Saguier, Percell Miller, PA-C  methylPREDNISolone (MEDROL DOSEPAK) 4 MG TBPK tablet follow package  directions Patient not taking: Reported on 11/26/2019 11/26/19   Melvenia Beam, MD  nadolol (CORGARD) 40 MG tablet TAKE 1 TABLET BY MOUTH EVERY DAY 06/02/14   Inda Castle, MD  oxycodone (OXY-IR) 5 MG capsule Take 1 capsule (5 mg total) by mouth every 4 (four) hours as needed. 11/26/19   Melvenia Beam, MD    Allergies    Aripiprazole, Lorayne Bender [paliperidone er], Abilify [aripiprazole], Lamictal [lamotrigine], and Lexapro [escitalopram oxalate]  Review of Systems   Review of Systems  Constitutional: Negative for chills and fever.  HENT: Negative for congestion and facial swelling.   Eyes: Negative for discharge and visual disturbance.  Respiratory: Negative for shortness of breath.   Cardiovascular: Negative for chest pain and palpitations.  Gastrointestinal: Negative for abdominal pain, diarrhea and vomiting.  Musculoskeletal: Negative for arthralgias and myalgias.  Skin: Negative for color change and rash.  Neurological: Positive for headaches. Negative for tremors and syncope.  Psychiatric/Behavioral: Negative for confusion and dysphoric mood.    Physical Exam Updated Vital Signs BP (!) 168/73 (BP Location: Right Arm)   Pulse 60   Temp 99 F (37.2 C) (Oral)   Resp 20   Ht 5\' 10"  (1.778 m)   Wt (!) 111.1 kg   SpO2 98%   BMI 35.15 kg/m   Physical Exam Vitals and nursing note reviewed.  Constitutional:      Appearance: He is well-developed.  HENT:     Head: Normocephalic and atraumatic.  Eyes:     Pupils: Pupils are equal, round, and reactive to light.  Neck:     Vascular: No JVD.  Cardiovascular:     Rate and Rhythm: Normal rate and regular rhythm.     Heart sounds: No murmur heard.  No friction rub. No gallop.   Pulmonary:     Effort: No respiratory distress.     Breath sounds: No wheezing.  Abdominal:     General: There is no distension.     Tenderness: There is no guarding or rebound.  Musculoskeletal:        General: Normal range of motion.      Cervical back: Normal range of motion and neck supple.  Skin:    Coloration: Skin is not pale.     Findings: No rash.  Neurological:     Mental Status: He is alert and oriented to person, place, and time.     GCS: GCS eye subscore is 4. GCS verbal subscore is 5. GCS motor subscore is 6.     Cranial Nerves: Cranial nerves are intact.     Sensory: Sensation is intact.     Motor: Motor function is  intact.     Coordination: Coordination is intact.     Gait: Gait is intact.  Psychiatric:        Behavior: Behavior normal.     ED Results / Procedures / Treatments   Labs (all labs ordered are listed, but only abnormal results are displayed) Labs Reviewed - No data to display  EKG None  Radiology No results found.  Procedures Procedures (including critical care time)  Medications Ordered in ED Medications  prochlorperazine (COMPAZINE) injection 10 mg (10 mg Intramuscular Given 11/30/19 1904)  diphenhydrAMINE (BENADRYL) injection 25 mg (25 mg Intramuscular Given 11/30/19 1904)  dexamethasone (DECADRON) tablet 10 mg (10 mg Oral Given 11/30/19 1903)    ED Course  I have reviewed the triage vital signs and the nursing notes.  Pertinent labs & imaging results that were available during my care of the patient were reviewed by me and considered in my medical decision making (see chart for details).    MDM Rules/Calculators/A&P                          64 yo M with a chief complaint of a headache.  This been a chronic issue for him.  Has recently been on a trial of narcotics with minimal improvement.  No red flags.  Will treat with a headache cocktail.  Reassess.  Patient is feeling much better on reassessment requesting discharge home.  Neuro follow-up.   7:59 PM:  I have discussed the diagnosis/risks/treatment options with the patient and believe the pt to be eligible for discharge home to follow-up with PCP. We also discussed returning to the ED immediately if new or worsening sx  occur. We discussed the sx which are most concerning (e.g., sudden worsening pain, fever, inability to tolerate by mouth) that necessitate immediate return. Medications administered to the patient during their visit and any new prescriptions provided to the patient are listed below.  Medications given during this visit Medications  prochlorperazine (COMPAZINE) injection 10 mg (10 mg Intramuscular Given 11/30/19 1904)  diphenhydrAMINE (BENADRYL) injection 25 mg (25 mg Intramuscular Given 11/30/19 1904)  dexamethasone (DECADRON) tablet 10 mg (10 mg Oral Given 11/30/19 1903)     The patient appears reasonably screen and/or stabilized for discharge and I doubt any other medical condition or other [facility] requiring further screening, evaluation, or treatment in the ED at this time prior to discharge.   Final Clinical Impression(s) / ED Diagnoses Final diagnoses:  Tension headache    Rx / DC Orders ED Discharge Orders    None       Deno Etienne, DO 11/30/19 1959

## 2019-11-30 NOTE — ED Triage Notes (Signed)
Pt arrives with c/o history of headaches, states that he had a nerve block in both sides of his neck on Friday. Pt had Toradol shot at PCP on Friday. Pt has also been put on oral pain medication for headaches.  Pt reports the nerve block and other medications are not working.

## 2019-11-30 NOTE — ED Notes (Signed)
ED Provider at bedside. 

## 2019-12-01 ENCOUNTER — Other Ambulatory Visit: Payer: Self-pay | Admitting: Neurology

## 2019-12-01 MED ORDER — TIZANIDINE HCL 4 MG PO TABS
ORAL_TABLET | ORAL | 0 refills | Status: DC
Start: 2019-12-01 — End: 2019-12-17

## 2019-12-04 ENCOUNTER — Encounter: Payer: Self-pay | Admitting: Medical

## 2019-12-06 DIAGNOSIS — G2401 Drug induced subacute dyskinesia: Secondary | ICD-10-CM | POA: Diagnosis not present

## 2019-12-06 DIAGNOSIS — G4700 Insomnia, unspecified: Secondary | ICD-10-CM | POA: Diagnosis not present

## 2019-12-06 DIAGNOSIS — F319 Bipolar disorder, unspecified: Secondary | ICD-10-CM | POA: Diagnosis not present

## 2019-12-08 ENCOUNTER — Encounter: Payer: Self-pay | Admitting: Physical Medicine and Rehabilitation

## 2019-12-08 ENCOUNTER — Ambulatory Visit: Payer: Self-pay

## 2019-12-08 ENCOUNTER — Other Ambulatory Visit: Payer: Self-pay

## 2019-12-08 ENCOUNTER — Ambulatory Visit (INDEPENDENT_AMBULATORY_CARE_PROVIDER_SITE_OTHER): Payer: BC Managed Care – PPO | Admitting: Physical Medicine and Rehabilitation

## 2019-12-08 VITALS — BP 156/79 | HR 72

## 2019-12-08 DIAGNOSIS — M542 Cervicalgia: Secondary | ICD-10-CM | POA: Diagnosis not present

## 2019-12-08 DIAGNOSIS — M5010 Cervical disc disorder with radiculopathy, unspecified cervical region: Secondary | ICD-10-CM | POA: Diagnosis not present

## 2019-12-08 DIAGNOSIS — M5481 Occipital neuralgia: Secondary | ICD-10-CM

## 2019-12-08 DIAGNOSIS — R519 Headache, unspecified: Secondary | ICD-10-CM

## 2019-12-08 DIAGNOSIS — G4486 Cervicogenic headache: Secondary | ICD-10-CM

## 2019-12-08 NOTE — Progress Notes (Signed)
Pt states that his pain is in this right side neck. Pt states blending over and turn when he is driving. Pt states eletric system and cold air and bio freeze and meds. Pt has hx of inj on 03/19/19 it wonderful it was better after three days. Pt states his pain started again at the end of June.  Numeric Pain Rating Scale and Functional Assessment Average Pain 6   In the last MONTH (on 0-10 scale) has pain interfered with the following?  1. General activity like being  able to carry out your everyday physical activities such as walking, climbing stairs, carrying groceries, or moving a chair?  Rating(9)   +Driver, -BT, -Dye Allergies.

## 2019-12-09 ENCOUNTER — Encounter: Payer: Self-pay | Admitting: Physical Medicine and Rehabilitation

## 2019-12-09 DIAGNOSIS — G2401 Drug induced subacute dyskinesia: Secondary | ICD-10-CM | POA: Diagnosis not present

## 2019-12-09 DIAGNOSIS — F319 Bipolar disorder, unspecified: Secondary | ICD-10-CM | POA: Diagnosis not present

## 2019-12-09 DIAGNOSIS — G4486 Cervicogenic headache: Secondary | ICD-10-CM | POA: Insufficient documentation

## 2019-12-09 DIAGNOSIS — G4700 Insomnia, unspecified: Secondary | ICD-10-CM | POA: Diagnosis not present

## 2019-12-09 NOTE — Procedures (Signed)
Cervical Epidural Steroid Injection - Interlaminar Approach with Fluoroscopic Guidance  Patient: Angel [name]      Date of Birth: 25-Jan-1956 MRN: [id number] PCP: [name], PA-C      Visit Date: 12/08/2019   Universal Protocol:    Date/Time: 08/03/216:20 AM  Consent Given By: the patient  Position: PRONE  Additional Comments: Vital signs were monitored before and after the procedure. Patient was prepped and draped in the usual sterile fashion. The correct patient, procedure, and site was verified.   Injection Procedure Details:  Procedure Site One Meds Administered: No orders of the defined types were placed in this encounter.    Laterality: Right  Location/Site: C7-T1  Needle size: 20 G  Needle type: Touhy  Needle Placement: Paramedian epidural space  Findings:  -Comments: Excellent flow of contrast into the epidural space.  Procedure Details: Using a paramedian approach from the side mentioned above, the region overlying the inferior lamina was localized under fluoroscopic visualization and the soft tissues overlying this structure were infiltrated with 4 ml. of 1% Lidocaine without Epinephrine. A # 20 gauge, Tuohy needle was inserted into the epidural space using a paramedian approach.  The epidural space was localized using loss of resistance along with lateral and contralateral oblique bi-planar fluoroscopic views.  After negative aspirate for air, blood, and CSF, a 2 ml. volume of Isovue-250 was injected into the epidural space and the flow of contrast was observed. Radiographs were obtained for documentation purposes.   The injectate was administered into the level noted above.  Additional Comments:  The patient tolerated the procedure well Dressing: 2 x 2 sterile gauze and Band-Aid    Post-procedure details: Patient was observed during the procedure. Post-procedure instructions were reviewed.  Patient left the clinic in stable condition.

## 2019-12-09 NOTE — Progress Notes (Signed)
Angel [name] - 64 y.o. male MRN [id number]  Date of birth: 02-16-1956  Office Visit Note: Visit Date: 12/08/2019 PCP: [name], PA-C Referred by: [name], PA-C  Subjective: Chief Complaint  Patient presents with  . Neck - Pain  . Head - Pain   HPI: Angel [name] is a 64 y.o. male who comes in today Evaluation and management of chronic recalcitrant episodic neck pain with occipital neuralgia and headache.  This is in the setting with bipolar disorder and chronic pain.  By way of review in the past he has had flareup of episodes and when they are severe they are very severe.  To some degree it is hard to make sense of exactly what is causing all of his symptoms but he clearly has some level of pain that resembles occipital neuralgia.  We did have him see Dr. Sarina Ill for evaluation and continued management of potential occipital neuralgia.  Patient is present with his partner today provides some of the history.  I did get a nice note from Dr. Jaynee Eagles and I did review her consultation notes.  Per the patient and his partner in the notes he did receive occipital type field block which did help temporarily.  He also received short course of oxycodone for 5 days.  She also placed him on tizanidine.  I did review controlled substance list today.  In the past cervical epidural injections seem to relieve all of his pain including the headache.  We discussed at length today that there is no direct correlation between the cervical spine and any changes there and nerve issues with his headache although it can be what is referred to a cervicogenic headache where may be there is a trigger that triggers this occipital neuralgia.  Dr. Jaynee Eagles suggested episodic epidural injection.  We have also discussed this with him in terms of when his symptoms begin to flareup.  The problem is the patient usually calls in extreme pain and wants to be seen the same day which is very hard in our chronic clinic and  was hard to see someone abruptly for epidural injection.  Some of this is just scheduling some of its insurance at times etc.  Patient will be going on Medicare soon.  He is not getting any radicular complaints down the arms he is having neck pain and headache tickly on the right.  He has had physical therapy with dry needling and does state that that helps at times.  Medications help at times.  We had a long discussion today and spoke with them over 30 minutes just on management of neck pain and myofascial pain and sources of cervicogenic headache and what this means.  We also went over potential for facet joint blocks and ablation.  Review of Systems  Musculoskeletal: Positive for neck pain.  Neurological: Positive for headaches.  All other systems reviewed and are negative.  Otherwise per HPI.  Assessment & Plan: Visit Diagnoses:  1. Cervical disc disorder with radiculopathy   2. Cervicalgia   3. Cervicogenic headache   4. Occipital neuralgia of right side     Plan: Findings:  1.  Cervicalgia with cervical radicular type pain into the shoulder with history of cervical MRI findings at C4-5 and C5-6 with mild overall canal stenosis but some flattening of the ventral aspect of the cord but no cord signal changes etc.  Some foraminal narrowing at this level.  He does tend to get relief with epidural injection and these  are very episodic and few and far between.  We discussed at length how these cannot really be done as a preventative and I have counseled him on the fact that if he can find out when the symptoms are just starting to begin and he thinks that this may be coming on the and that may be a good signal to try the epidural injection at times when it begins to flareup instead of waiting for it to be severe.  Some of his pain is probably myofascial pain and he does have trigger points on exam.  Consider regrouping with physical therapy at times for dry needling.  2.  Occipital neuralgia and  cervicogenic headache.  Right now he is followed by Dr. Lavell Anchors who really suggest the cervical epidural injection be repeated since it gave him such good relief before.  We will can continue to do that would consider looking at cervical facets and radiofrequency ablation if needed and she will continue to follow-up with her for headache management.  In terms of opioid medication these need to be considered through his primary care provider and this is not something we can do long-term here on a chronic basis.  He was not asking for this but just making note of this in case this became more of a chronic issue.    Meds & Orders: No orders of the defined types were placed in this encounter.   Orders Placed This Encounter  Procedures  . XR C-ARM NO REPORT  . Epidural Steroid injection    Follow-up: Return if symptoms worsen or fail to improve.   Procedures: No procedures performed  Cervical Epidural Steroid Injection - Interlaminar Approach with Fluoroscopic Guidance  Patient: Angel [name]      Date of Birth: Oct 10, 1955 MRN: [id number] PCP: [name], PA-C      Visit Date: 12/08/2019   Universal Protocol:    Date/Time: 08/03/216:20 AM  Consent Given By: the patient  Position: PRONE  Additional Comments: Vital signs were monitored before and after the procedure. Patient was prepped and draped in the usual sterile fashion. The correct patient, procedure, and site was verified.   Injection Procedure Details:  Procedure Site One Meds Administered: No orders of the defined types were placed in this encounter.    Laterality: Right  Location/Site: C7-T1  Needle size: 20 G  Needle type: Touhy  Needle Placement: Paramedian epidural space  Findings:  -Comments: Excellent flow of contrast into the epidural space.  Procedure Details: Using a paramedian approach from the side mentioned above, the region overlying the inferior lamina was localized under fluoroscopic  visualization and the soft tissues overlying this structure were infiltrated with 4 ml. of 1% Lidocaine without Epinephrine. A # 20 gauge, Tuohy needle was inserted into the epidural space using a paramedian approach.  The epidural space was localized using loss of resistance along with lateral and contralateral oblique bi-planar fluoroscopic views.  After negative aspirate for air, blood, and CSF, a 2 ml. volume of Isovue-250 was injected into the epidural space and the flow of contrast was observed. Radiographs were obtained for documentation purposes.   The injectate was administered into the level noted above.  Additional Comments:  The patient tolerated the procedure well Dressing: 2 x 2 sterile gauze and Band-Aid    Post-procedure details: Patient was observed during the procedure. Post-procedure instructions were reviewed.  Patient left the clinic in stable condition.      Clinical History: MRI CERVICAL SPINE WITHOUT CONTRAST  TECHNIQUE:  Multiplanar, multisequence MR imaging of the cervical spine was performed. No intravenous contrast was administered.  COMPARISON:  None available.  FINDINGS: Alignment: Straightening of the normal cervical lordosis. No listhesis.  Vertebrae: Vertebral body heights are maintained without evidence for acute or chronic fracture. Bone marrow signal intensity within normal limits. Benign hemangioma noted within the T1 vertebral body. Prominent reactive endplate changes present about the C5-6 interspace. No worrisome osseous lesions.  Cord: Signal intensity within the cervical spinal cord is normal.  Posterior Fossa, vertebral arteries, paraspinal tissues: Visualized brain and posterior fossa within normal limits. Craniocervical junction normal. Paraspinous and prevertebral soft tissues normal. Normal intravascular flow voids present within the vertebral arteries bilaterally.  Disc levels:  C2-C3: Mild uncovertebral  hypertrophy. Right C2-3 facets appear fused. No significant stenosis.  C3-C4: Left eccentric disc bulge with left greater than right uncovertebral hypertrophy. Right greater than left facet hypertrophy. No significant spinal stenosis. Mild bilateral C4 foraminal narrowing.  C4-C5: Diffuse disc bulge with bilateral uncovertebral hypertrophy. Posterior disc osteophyte flattens the ventral thecal sac results in mild spinal stenosis. Moderate left with mild right C5 foraminal narrowing.  C5-C6: Chronic circumferential disc osteophyte complex with intervertebral disc space narrowing. Broad posterior component flattens the ventral thecal sac with mild spinal stenosis. Moderate to severe right with moderate left C6 foraminal narrowing.  C6-C7: Mild disc bulge with uncovertebral hypertrophy. No significant stenosis.  C7-T1:  Minimal facet hypertrophy on the left.  No stenosis.  Visualized upper thoracic spine within normal limits.  IMPRESSION: 1. Degenerative disc osteophyte at C4-5 and C5-6 with resultant mild spinal stenosis. 2. Multifactorial degenerative changes with resultant multilevel foraminal narrowing as above. Notable findings include moderate left C5 foraminal stenosis, with moderate to severe right and moderate left C6 foraminal narrowing.   Electronically Signed   By: Jeannine Boga M.D.   On: 10/28/2017 06:12   He reports that he quit smoking about 10 years ago. His smoking use included cigarettes. He has never used smokeless tobacco.  Recent Labs    02/28/19 1028  HGBA1C 6.3    Objective:  VS:  HT:    WT:   BMI:     BP:(!) 156/79  HR:72bpm  TEMP: ( )  RESP:  Physical Exam Vitals and nursing note reviewed.  Constitutional:      General: He is not in acute distress.    Appearance: He is well-developed. He is obese.  HENT:     Head: Normocephalic and atraumatic.     Nose: Nose normal.     Mouth/Throat:     Mouth: Mucous membranes are  moist.     Pharynx: Oropharynx is clear.  Eyes:     Conjunctiva/sclera: Conjunctivae normal.     Pupils: Pupils are equal, round, and reactive to light.  Neck:     Trachea: No tracheal deviation.  Cardiovascular:     Rate and Rhythm: Normal rate and regular rhythm.     Pulses: Normal pulses.  Pulmonary:     Effort: Pulmonary effort is normal.     Breath sounds: Normal breath sounds.  Abdominal:     General: There is no distension.     Palpations: Abdomen is soft.     Tenderness: There is no guarding or rebound.  Musculoskeletal:        General: No deformity.     Cervical back: Neck supple. Tenderness present. No rigidity.     Right lower leg: No edema.     Left lower leg: No edema.  Comments: Patient does have trigger points in the levator scapula and in the right sternocleidomastoid and scalene musculature.  He has some shoulder impingement sign.  Has good strength in the hands bilaterally.  No sensory deficit.  Skin:    General: Skin is warm and dry.     Findings: No erythema or rash.  Neurological:     General: No focal deficit present.     Mental Status: He is alert and oriented to person, place, and time.     Sensory: No sensory deficit.     Motor: No abnormal muscle tone.     Coordination: Coordination normal.     Gait: Gait normal.  Psychiatric:        Mood and Affect: Mood normal.        Behavior: Behavior normal.        Thought Content: Thought content normal.     Ortho Exam  Imaging: XR C-ARM NO REPORT  Result Date: 12/08/2019 Please see Notes tab for imaging impression.   Past Medical/Family/Surgical/Social History: Medications & Allergies reviewed per EMR, new medications updated. Patient Active Problem List   Diagnosis Date Noted  . Cervicogenic headache 12/09/2019  . Occipital neuralgia of left side 04/10/2019  . Hyperglycemia 10/07/2013  . Hepatic cirrhosis ([place]) 10/03/2013  . Bipolar I disorder, most recent episode (or current) depressed,  unspecified 05/21/2013  . Hepatitis B 03/21/2013  . Esophageal reflux 03/21/2013  . Personal history of colonic polyps 03/21/2013  . Bipolar disorder, unspecified ([place]) 09/03/2011   Past Medical History:  Diagnosis Date  . Bipolar disorder ([place])   . 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 Depression   . GERD (gastroesophageal reflux disease)   . Headache   . Hepatitis B    Hepatitis E. antigen positive by history, status post treatment with Hepsera and baraclude   . Hepatitis B carrier ([place])   . Personal history of colonic polyps    Adenomas polyp 2008 and 2010, 2015   . Weight loss    Pt. has lost 10 lbs since pre-visit, intentionally with diet and walking   Family History  Problem Relation Age of Onset  . Heart disease Mother   . Diabetes Father   . Kidney failure Father   . Kidney disease Father   . Other Sister        GERD  . Colon cancer Neg Hx   . Rectal cancer Neg Hx   . Stomach cancer Neg Hx   . Esophageal cancer Neg Hx   . Headache Neg Hx   . Migraines Neg Hx    Past Surgical History:  Procedure Laterality Date  . APPENDECTOMY    . COLONOSCOPY    . POLYPECTOMY    . UPPER GASTROINTESTINAL ENDOSCOPY     Social History   Occupational History  . Occupation: retired    Fish farm manager: NOT EMPLOYED  Tobacco Use  . Smoking status: Former Smoker    Types: Cigarettes    Quit date: 05/08/2009    Years since quitting: 10.5  . Smokeless tobacco: Never Used  Vaping Use  . Vaping Use: Never used  Substance and Sexual Activity  . Alcohol use: No    Alcohol/week: 0.0 standard drinks  . Drug use: No  . Sexual activity: Not Currently

## 2019-12-10 ENCOUNTER — Telehealth: Payer: Self-pay

## 2019-12-10 NOTE — Telephone Encounter (Signed)
Pt had C7-T1 IL on 12/08/19. Pt called today with complaints of Cont. High pain on his rt arm and shoulder. Pt states he has muscle spasm and can't move properly. Pt would like a phone call or an virtual visit today.    Please Advise.

## 2019-12-10 NOTE — Telephone Encounter (Signed)
Let him know I reviewed images and injection looks perfect with no issues or problems at all. One or two days of increased pain/spasm can happen. He can [place] the methocarbamol(robaxin) and or tizanidine that he already has OR we can try baclofen for spasm. I would also use ice 90min on and 20 min off and continue to do gentle range of motion with the neck and shoulders. If not calming down or getting worse by Friday call us back.  Next step is continued worsening would be repeat MRI cervical spine.

## 2019-12-10 NOTE — Telephone Encounter (Signed)
Called pt to inform him that the images and injection looked good no issues or problems. I told pt that it may be one or two days of increased pain/spasm can happen. I informed the pt that he has prescription for Methocarbamol to try. Also inform pt to use ice pad for 20 mins on and oof with gentle range of motion with the neck and shoulder.

## 2019-12-12 ENCOUNTER — Encounter: Payer: Self-pay | Admitting: Medical

## 2019-12-12 ENCOUNTER — Telehealth: Payer: Self-pay | Admitting: Medical

## 2019-12-12 DIAGNOSIS — G4700 Insomnia, unspecified: Secondary | ICD-10-CM | POA: Diagnosis not present

## 2019-12-12 DIAGNOSIS — F319 Bipolar disorder, unspecified: Secondary | ICD-10-CM | POA: Diagnosis not present

## 2019-12-12 MED ORDER — FAMCICLOVIR 500 MG PO TABS
500.0000 mg | ORAL_TABLET | Freq: Three times a day (TID) | ORAL | 0 refills | Status: DC
Start: 2019-12-12 — End: 2020-02-10

## 2019-12-12 NOTE — Telephone Encounter (Signed)
Rx famvir sent to pt pharmacy.

## 2019-12-13 ENCOUNTER — Encounter: Payer: Self-pay | Admitting: Medical

## 2019-12-15 ENCOUNTER — Telehealth: Payer: Self-pay | Admitting: Physical Medicine and Rehabilitation

## 2019-12-15 NOTE — Telephone Encounter (Signed)
See previous message

## 2019-12-15 NOTE — Telephone Encounter (Signed)
Patient called asked for a call back concerning the pain in his upper right arm. The number to contact patient is [phone]

## 2019-12-15 NOTE — Telephone Encounter (Signed)
Patient has continued pain in his right upper arm. He states his "arm is useless." Using the arm causes pain. Sometimes the pain is shooting. He wants to know if there is anything he can do tonight to relieve the pain. He sates that he has never had a shooting pain like this in his life. He does state that the pain in the back of his neck is better. Please advise.

## 2019-12-15 NOTE — Telephone Encounter (Signed)
Left message #1 asking patient to call us back to give [place] more details regarding his arm pain.

## 2019-12-15 NOTE — Telephone Encounter (Signed)
Returned patient's call and left message #2.

## 2019-12-15 NOTE — Telephone Encounter (Signed)
Pt called stating he missed a CB from [place] and would like for her to try him again.

## 2019-12-15 NOTE — Telephone Encounter (Signed)
Reminder for rx. Called patient and left message to advise and asking him to call back.

## 2019-12-15 NOTE — Telephone Encounter (Signed)
Can send in short course of pain medication, if more shoulder then may want to see one of the ortho guys, if shooting down the arm into hand with tingling then MRI Cspine may be right answer. Send me message back on medication

## 2019-12-16 ENCOUNTER — Telehealth: Payer: Self-pay

## 2019-12-16 ENCOUNTER — Other Ambulatory Visit: Payer: Self-pay | Admitting: Physical Medicine and Rehabilitation

## 2019-12-16 MED ORDER — HYDROCODONE-ACETAMINOPHEN 5-325 MG PO TABS: 1.0000 | ORAL_TABLET | Freq: Four times a day (QID) | ORAL | 0 refills | Status: DC | PRN

## 2019-12-16 NOTE — Progress Notes (Signed)
Rx for one time Norco for arm pain. See messages. Patient's 12 month [place] history was reviewed and no inappropriate medication refills noted.

## 2019-12-16 NOTE — Telephone Encounter (Signed)
Pt called and stated he talked to Dr. Louanne Skye last night who stated he would try to work him in the morning. Please call pt back to discuss   Best CB : [phone] Alternate: [phone]

## 2019-12-16 NOTE — Telephone Encounter (Signed)
Norco sent.

## 2019-12-16 NOTE — Telephone Encounter (Signed)
Worked in for 8/11 @ 845am

## 2019-12-17 ENCOUNTER — Ambulatory Visit (INDEPENDENT_AMBULATORY_CARE_PROVIDER_SITE_OTHER): Payer: BC Managed Care – PPO | Admitting: Specialist

## 2019-12-17 ENCOUNTER — Ambulatory Visit: Payer: Self-pay

## 2019-12-17 ENCOUNTER — Other Ambulatory Visit: Payer: Self-pay | Admitting: Medical

## 2019-12-17 ENCOUNTER — Other Ambulatory Visit: Payer: Self-pay

## 2019-12-17 ENCOUNTER — Encounter: Payer: Self-pay | Admitting: Specialist

## 2019-12-17 VITALS — BP 116/71 | HR 67 | Ht 70.0 in | Wt 255.6 lb

## 2019-12-17 DIAGNOSIS — M5412 Radiculopathy, cervical region: Secondary | ICD-10-CM

## 2019-12-17 DIAGNOSIS — M25511 Pain in right shoulder: Secondary | ICD-10-CM

## 2019-12-17 DIAGNOSIS — M542 Cervicalgia: Secondary | ICD-10-CM | POA: Diagnosis not present

## 2019-12-17 DIAGNOSIS — M4722 Other spondylosis with radiculopathy, cervical region: Secondary | ICD-10-CM | POA: Diagnosis not present

## 2019-12-17 MED ORDER — METHYLPREDNISOLONE 4 MG PO TBPK
ORAL_TABLET | ORAL | 0 refills | Status: DC
Start: 2019-12-17 — End: 2020-02-10

## 2019-12-17 NOTE — Progress Notes (Signed)
Office Visit Note   Patient: Angel [name]           Date of Birth: Aug 09, 1955           MRN: [id number] Visit Date: 12/17/2019              Requested by: [name], PA-C [address] [place],  [place] [id number] PCP: [name], PA-C   Assessment & Plan: Visit Diagnoses:  1. Right shoulder pain, unspecified chronicity   2. Cervicalgia   3. Other spondylosis with radiculopathy, cervical region   4. Radiculopathy, cervical region     Plan: Avoid overhead lifting and overhead use of the arms. Do not lift greater than 5 lbs. Adjust head rest in vehicle to prevent hyperextension if rear ended. Take extra precautions to avoid falling. Move the right shoulder to prevent a frozen shoulder. MRI of the neck ordered due to new right C7 weakness.  Follow-Up Instructions: Return in about 2 weeks (around 12/31/2019).   Orders:  Orders Placed This Encounter  Procedures  . XR Shoulder Right  . XR Cervical Spine 2 or 3 views  . MR Cervical Spine w/o contrast   Meds ordered this encounter  Medications  . methylPREDNISolone (MEDROL DOSEPAK) 4 MG TBPK tablet    Sig: 6 day dose pack take a directed    Dispense:  21 tablet    Refill:  0      Procedures: No procedures performed   Clinical Data: No additional findings.   Subjective: Chief Complaint  Patient presents with  . Right Shoulder - Pain    64 year old male right handed with history of cervical spondylosis and previous cervical ESIs done. Has had nerve root blood, dry needling at [facility] and has seen Dr. Jaynee Eagles. He reports that he was sleep walking and knocking over glass on a table and then another time disconnecting the Roku. 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 He underwent a C   Review of Systems   Objective: Vital Signs: BP 116/71   Pulse 67   Ht 5\' 10"  (1.778 m)   Wt 255 lb 9.6 oz (115.9 kg)    BMI 36.67 kg/m   Physical Exam  Ortho Exam  Specialty Comments:  No specialty comments available.  Imaging: No results found.   PMFS History: Patient Active Problem List   Diagnosis Date Noted  . Cervicogenic headache 12/09/2019  . Occipital neuralgia of left side 04/10/2019  . Hyperglycemia 10/07/2013  . Hepatic cirrhosis ([place]) 10/03/2013  . Bipolar I disorder, most recent episode (or current) depressed, unspecified 05/21/2013  . Hepatitis B 03/21/2013  . Esophageal reflux 03/21/2013  . Personal history of colonic polyps 03/21/2013  . Bipolar disorder, unspecified ([place]) 09/03/2011   Past Medical History:  Diagnosis Date  . Bipolar disorder ([place])   . 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 Depression   . GERD (gastroesophageal reflux disease)   . Headache   . Hepatitis B    Hepatitis E. antigen positive by history, status post treatment with Hepsera and baraclude   . 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 Personal history of colonic polyps    Adenomas polyp 2008 and 2010, 2015   . Weight loss    Pt. has lost 10 lbs since pre-visit, intentionally with diet and walking    Family History  Problem Relation  Age of Onset  . Heart disease Mother   . Diabetes Father   . Kidney failure Father   . Kidney disease Father   . Other Sister        GERD  . Colon cancer Neg Hx   . Rectal cancer Neg Hx   . Stomach cancer Neg Hx   . Esophageal cancer Neg Hx   . Headache Neg Hx   . Migraines Neg Hx     Past Surgical History:  Procedure Laterality Date  . APPENDECTOMY    . COLONOSCOPY    . POLYPECTOMY    . UPPER GASTROINTESTINAL ENDOSCOPY     Social History   Occupational History  . Occupation: retired    Fish farm manager: NOT EMPLOYED  Tobacco Use  . Smoking status: Former Smoker    Types: Cigarettes    Quit date: 05/08/2009    Years since quitting: 10.6  . Smokeless tobacco: Never Used  Vaping Use  . Vaping Use: Never used  Substance and Sexual Activity  . Alcohol  use: No    Alcohol/week: 0.0 standard drinks  . Drug use: No  . Sexual activity: Not Currently

## 2019-12-17 NOTE — Patient Instructions (Addendum)
Avoid overhead lifting and overhead use of the arms. Do not lift greater than 5 lbs. Adjust head rest in vehicle to prevent hyperextension if rear ended. Take extra precautions to avoid falling. Move the right shoulder to prevent a frozen shoulder. MRI of the neck ordered due to new right C7 weakness.

## 2019-12-18 ENCOUNTER — Telehealth: Payer: Self-pay | Admitting: Specialist

## 2019-12-18 NOTE — Telephone Encounter (Signed)
I called and advised to stop meds, and to take benadryl for the itching, patient states that he understands

## 2019-12-18 NOTE — Telephone Encounter (Signed)
Patient called requesting a call back. Patient is having a reaction to [place]. Medication is causing itching. Please call patient at [phone]. Patient asked for message to be sent high priority.

## 2019-12-19 ENCOUNTER — Other Ambulatory Visit: Payer: Self-pay | Admitting: Medical

## 2019-12-22 DIAGNOSIS — G4700 Insomnia, unspecified: Secondary | ICD-10-CM | POA: Diagnosis not present

## 2019-12-22 DIAGNOSIS — F319 Bipolar disorder, unspecified: Secondary | ICD-10-CM | POA: Diagnosis not present

## 2019-12-22 DIAGNOSIS — Z79899 Other long term (current) drug therapy: Secondary | ICD-10-CM | POA: Diagnosis not present

## 2019-12-23 ENCOUNTER — Other Ambulatory Visit: Payer: Self-pay | Admitting: Neurology

## 2019-12-24 ENCOUNTER — Other Ambulatory Visit: Payer: Self-pay

## 2019-12-24 ENCOUNTER — Telehealth (INDEPENDENT_AMBULATORY_CARE_PROVIDER_SITE_OTHER): Payer: BC Managed Care – PPO | Admitting: Medical

## 2019-12-24 ENCOUNTER — Other Ambulatory Visit: Payer: Self-pay | Admitting: Neurology

## 2019-12-24 ENCOUNTER — Encounter: Payer: Self-pay | Admitting: Medical

## 2019-12-24 VITALS — BP 142/84 | HR 79

## 2019-12-24 DIAGNOSIS — R197 Diarrhea, unspecified: Secondary | ICD-10-CM | POA: Diagnosis not present

## 2019-12-24 DIAGNOSIS — F319 Bipolar disorder, unspecified: Secondary | ICD-10-CM

## 2019-12-24 DIAGNOSIS — K7469 Other cirrhosis of liver: Secondary | ICD-10-CM

## 2019-12-24 DIAGNOSIS — Z79899 Other long term (current) drug therapy: Secondary | ICD-10-CM | POA: Diagnosis not present

## 2019-12-24 MED ORDER — TIZANIDINE HCL 2 MG PO CAPS: 2.0000 mg | ORAL_CAPSULE | Freq: Three times a day (TID) | ORAL | 1 refills | Status: DC | PRN

## 2019-12-24 NOTE — Progress Notes (Signed)
Subjective:    Patient ID: Angel [name], male    DOB: [birth year], 64 y.o.   MRN: [id number]  HPI  Virtual Visit via Video Note  I connected with [name] on 12/24/19 at  4:40 PM EDT by a video enabled telemedicine application and verified that I am speaking with the correct person using two identifiers.  Location: Patient: home Provider: office   I discussed the limitations of evaluation and management by telemedicine and the availability of in person appointments. The patient expressed understanding and agreed to proceed.  History of Present Illness:  Pt has recent loose stools for 3-4 days of 3 loose stools a day. Pt states pepto bismal will help some but then symptoms will return. No fever, no chills or sweats.   Feeling a lot of gas and mild  Bloated. On and off loose stools for 3-4 weeks.   No recent antibiotics.  2018 colonoscopy showed polyp but no diverticulosis.  Pt just recently started seroquel. Given by psychiatrist. On med for one month 300 mg at night. Noticed oversedation and no energy. Though does sleep better at night. Pt has hx of chronic insomnia.   Labs done today order by psychiatrist.     Observations/Objective: General-no acute distress, pleasant, oriented. Lungs- on inspection lungs appear unlabored. Neck- no tracheal deviation or jvd on inspection. Neuro- gross motor function appears intact. abd- self palpation reports no significant pain.  Assessment and Plan: Patient has recent loose stools over the last 3 to 4 days with about 3 loose stools per day.  Considering viral cause, bacteria or C. Difficile stool studies orders placed and patient will pick up kit and turn those in by Friday.  Recommend bland diet guidelines and Imodium over-the-counter.  Stop Pepto-Bismol.  Discussion on whether or not Seroquel could cause loose stools.  This appears possible but not likely.  On review of some information on Seroquel up to 5% will can have loose  stools.  Explained patient would like to rule out infectious cause first before considering med side effect.  Oversedation and fatigue throughout the day reported with Seroquel use.  I advised patient to discuss this with psychiatrist to see [place] mild dose reduction could be done.  Asked patient to bring copies of lab work done by psychiatrist yesterday.  Want to see if CBC and metabolic panel done.  Follow-up date to be determined after review of lab work which patient states will drop off and review of Studies.  [name], PA-C     Follow Up Instructions:    I discussed the assessment and treatment plan with the patient. The patient was provided an opportunity to ask questions and all were answered. The patient agreed with the plan and demonstrated an understanding of the instructions.   The patient was advised to call back or seek an in-person evaluation if the symptoms worsen or if the condition fails to improve as anticipated.  Time spent with patient today was 30  minutes which consisted of chart revdiew, discussing diagnosis, work up treatment and documentation.   [name], PA-C    Review of Systems  Constitutional: Positive for fatigue. Negative for fever.       Onset fatigue/lethargy type feeling per pt when used seroquel.   HENT: Negative for congestion.   Cardiovascular: Negative for chest pain and palpitations.  Gastrointestinal: Positive for abdominal pain and diarrhea. Negative for nausea and vomiting.       Mild epigastric area per pt.  Musculoskeletal:  Negative for back pain.  Skin: Negative for rash.  Neurological: Negative for dizziness, light-headedness, numbness and headaches.  Hematological: Negative for adenopathy. Does not bruise/bleed easily.  Psychiatric/Behavioral: Positive for sleep disturbance. Negative for decreased concentration, dysphoric mood and suicidal ideas. The patient is not nervous/anxious.     Past Medical History:  Diagnosis  Date  . Bipolar disorder ([place])   . 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 Depression   . GERD (gastroesophageal reflux disease)   . Headache   . Hepatitis B    Hepatitis E. antigen positive by history, status post treatment with Hepsera and baraclude   . Hepatitis B carrier ([place])   . Personal history of colonic polyps    Adenomas polyp 2008 and 2010, 2015   . Weight loss    Pt. has lost 10 lbs since pre-visit, intentionally with diet and walking     Social History   Socioeconomic History  . Marital status: Married    Spouse name: [name]  . Number of children: 0  . Years of education: Not on file  . Highest education level: Master's degree (e.g., MA, MS, MEng, MEd, MSW, MBA)  Occupational History  . Occupation: retired    Fish farm manager: NOT EMPLOYED  Tobacco Use  . Smoking status: Former Smoker    Types: Cigarettes    Quit date: 05/08/2009    Years since quitting: 10.6  . Smokeless tobacco: Never Used  Vaping Use  . Vaping Use: Never used  Substance and Sexual Activity  . Alcohol use: No    Alcohol/week: 0.0 standard drinks  . Drug use: No  . Sexual activity: Not Currently  Other Topics Concern  . Not on file  Social History Narrative   Patient is right-handed. He is married to same sex partner. He gets little exercise.   Caffeine: 4 glasses of tea max/day   Social Determinants of Health   Financial Resource Strain:   . Difficulty of Paying Living Expenses: Not on file  Food Insecurity:   . Worried About Charity fundraiser in the Last Year: Not on file  . Ran Out of Food in the Last Year: Not on file  Transportation Needs:   . Lack of Transportation (Medical): Not on file  . Lack of Transportation (Non-Medical): Not on file  Physical Activity:   . Days of Exercise per Week: Not on file  . Minutes of Exercise per Session: Not on file  Stress:   . Feeling of Stress : Not on file  Social Connections:   . Frequency of Communication with Friends and  Family: Not on file  . Frequency of Social Gatherings with Friends and Family: Not on file  . Attends Religious Services: Not on file  . Active Member of Clubs or Organizations: Not on file  . Attends Archivist Meetings: Not on file  . Marital Status: Not on file  Intimate Partner Violence:   . Fear of Current or Ex-Partner: Not on file  . Emotionally Abused: Not on file  . Physically Abused: Not on file  . Sexually Abused: Not on file    Past Surgical History:  Procedure Laterality Date  . APPENDECTOMY    . COLONOSCOPY    . POLYPECTOMY    . UPPER GASTROINTESTINAL ENDOSCOPY      Family History  Problem Relation Age of Onset  . Heart disease Mother   . Diabetes Father   . Kidney failure Father   . Kidney  disease Father   . Other Sister        GERD  . Colon cancer Neg Hx   . Rectal cancer Neg Hx   . Stomach cancer Neg Hx   . Esophageal cancer Neg Hx   . Headache Neg Hx   . Migraines Neg Hx     Allergies  Allergen Reactions  . Aripiprazole Other (See Comments)    Tardive Dyskinesia  . Invega [Paliperidone Er] Other (See Comments)    Tardive dyskinsia symptoms  . Abilify [Aripiprazole]   . Lamictal [Lamotrigine] Rash  . Lexapro [Escitalopram Oxalate] Other (See Comments)    Doesn't Work    Current Outpatient Medications on File Prior to Visit  Medication Sig Dispense Refill  . bisacodyl (BISACODYL) 5 MG EC tablet TAKE 1 TABLET BY MOUTH EVERY DAY AS NEEDED FOR MODERATE CONSTIPATION 90 tablet 2  . divalproex (DEPAKOTE ER) 250 MG 24 hr tablet 1 bid 60 tablet 5  . famciclovir (FAMVIR) 500 MG tablet Take 1 tablet (500 mg total) by mouth 3 (three) times daily. 21 tablet 0  . lithium 600 MG capsule TAKE 1 CAPSULE BY MOUTH EVERY MORNING AND 2 CAPSULES EVERY NIGHT AT BEDTIME 90 capsule 5  . Melatonin 10 MG SUBL Place 10 mg under the tongue at bedtime.    . methocarbamol (ROBAXIN) 750 MG tablet Take 1 tablet (750 mg total) by mouth every 8 (eight) hours as needed  for muscle spasms. 90 tablet 0  . methylPREDNISolone (MEDROL DOSEPAK) 4 MG TBPK tablet 6 day dose pack take a directed (Patient not taking: Reported on 12/24/2019) 21 tablet 0  . nadolol (CORGARD) 40 MG tablet TAKE 1 TABLET BY MOUTH EVERY DAY 30 tablet 0  . Tenofovir Alafenamide Fumarate (VEMLIDY) 25 MG TABS Take by mouth.     No current facility-administered medications on file prior to visit.    BP (!) 142/84 (BP Location: Left Arm, Patient Position: Sitting, Cuff Size: Large)   Pulse 79       Objective:   Physical Exam        Assessment & Plan:

## 2019-12-25 NOTE — Patient Instructions (Signed)
Patient has recent loose stools over the last 3 to 4 days with about 3 loose stools per day.  Considering viral cause, bacteria or C. Difficile stool studies orders placed and patient will pick up kit and turn those in by Friday.  Recommend bland diet guidelines and Imodium over-the-counter.  Stop Pepto-Bismol.  Discussion on whether or not Seroquel could cause loose stools.  This appears possible but not likely.  On review of some information on Seroquel up to 5% will can have loose stools.  Explained patient would like to rule out infectious cause first before considering med side effect.  Oversedation and fatigue throughout the day reported with Seroquel use.  I advised patient to discuss this with psychiatrist to see [place] mild dose reduction could be done.  Asked patient to bring copies of lab work done by psychiatrist yesterday.  Want to see if CBC and metabolic panel done.  Follow-up date to be determined after review of lab work which patient states will drop off and review of Studies.

## 2019-12-26 ENCOUNTER — Other Ambulatory Visit: Payer: Self-pay | Admitting: Neurology

## 2019-12-26 ENCOUNTER — Other Ambulatory Visit: Payer: BC Managed Care – PPO

## 2019-12-26 ENCOUNTER — Other Ambulatory Visit: Payer: Self-pay

## 2019-12-26 DIAGNOSIS — R197 Diarrhea, unspecified: Secondary | ICD-10-CM | POA: Diagnosis not present

## 2019-12-27 ENCOUNTER — Encounter: Payer: Self-pay | Admitting: Medical

## 2019-12-27 LAB — CLOSTRIDIUM DIFFICILE BY PCR: Toxigenic C. Difficile by PCR: NEGATIVE

## 2019-12-29 ENCOUNTER — Encounter: Payer: Self-pay | Admitting: Medical

## 2019-12-29 DIAGNOSIS — B181 Chronic viral hepatitis B without delta-agent: Principal | ICD-10-CM

## 2019-12-29 DIAGNOSIS — K7460 Unspecified cirrhosis of liver: Principal | ICD-10-CM

## 2019-12-29 DIAGNOSIS — Z1289 Encounter for screening for malignant neoplasm of other sites: Principal | ICD-10-CM

## 2019-12-30 LAB — SALMONELLA/SHIGELLA CULT, CAMPY EIA AND SHIGA TOXIN RFL ECOLI
MICRO NUMBER: 10852726
MICRO NUMBER:: 10852727
MICRO NUMBER:: 10852728
Result:: NOT DETECTED
SHIGA RESULT:: NOT DETECTED
SPECIMEN QUALITY: ADEQUATE
SPECIMEN QUALITY:: ADEQUATE
SPECIMEN QUALITY:: ADEQUATE

## 2019-12-31 ENCOUNTER — Encounter: Payer: Self-pay | Admitting: Medical

## 2019-12-31 LAB — OVA AND PARASITE EXAMINATION
CONCENTRATE RESULT:: NONE SEEN
MICRO NUMBER:: 10852725
SPECIMEN QUALITY:: ADEQUATE
TRICHROME RESULT:: NONE SEEN

## 2020-01-02 ENCOUNTER — Telehealth: Payer: Self-pay | Admitting: Medical

## 2020-01-02 ENCOUNTER — Telehealth: Payer: BC Managed Care – PPO | Admitting: Family

## 2020-01-02 DIAGNOSIS — R197 Diarrhea, unspecified: Secondary | ICD-10-CM

## 2020-01-02 DIAGNOSIS — R109 Unspecified abdominal pain: Secondary | ICD-10-CM

## 2020-01-02 NOTE — Progress Notes (Signed)
Based on what you shared with me, I feel your condition warrants further evaluation and I recommend that you be seen for a face to face office visit.   Giving you have had diarrhea for over a week and you're having abdominal pain you need to be seen face-to-face to rule out a more serious infection   NOTE: If you entered your credit card information for this eVisit, you will not be charged. You may see a "hold" on your card for the $35 but that hold will drop off and you will not have a charge processed.   If you are having a true medical emergency please call 911.      For an urgent face to face visit, [place] has five urgent care centers for your convenience:      NEW:  [facility] [place] [facility] [place] Get Driving Directions [phone] [address] [place], [place] [address] . 10 am - 6pm Monday - Friday    [place] [facility] [place] [facility]) Get Driving Directions [phone] [address] [place], [place] [address] . 10 am to 8 pm Monday-Friday . 12 pm to 8 pm [facility] Get Driving Directions [phone] [address], [place], [place] [address] . 8 am to 8 pm Monday-Friday . 9 am to 6 pm Saturday . 11 am to 6 pm Sunday     [facility] [place] Get Driving Directions  [phone] [address] [place], [place] [address] . 8 am to 8 pm Monday-Friday . 8 am to 4 pm [facility] at [place] Get Driving Directions [phone] [address] [place], [place] [address] . 12 pm to 6 pm Monday-Friday      Your e-visit answers were reviewed by a board certified advanced clinical practitioner to complete your personal care plan.  Thank you for using e-Visits.

## 2020-01-03 DIAGNOSIS — G2401 Drug induced subacute dyskinesia: Secondary | ICD-10-CM | POA: Diagnosis not present

## 2020-01-03 DIAGNOSIS — R197 Diarrhea, unspecified: Secondary | ICD-10-CM | POA: Diagnosis not present

## 2020-01-03 DIAGNOSIS — Z79899 Other long term (current) drug therapy: Secondary | ICD-10-CM | POA: Diagnosis not present

## 2020-01-03 DIAGNOSIS — F319 Bipolar disorder, unspecified: Secondary | ICD-10-CM | POA: Diagnosis not present

## 2020-01-03 NOTE — Telephone Encounter (Signed)
Opened to review 

## 2020-01-05 ENCOUNTER — Telehealth: Payer: Self-pay | Admitting: Medical

## 2020-01-05 ENCOUNTER — Telehealth (INDEPENDENT_AMBULATORY_CARE_PROVIDER_SITE_OTHER): Payer: BC Managed Care – PPO | Admitting: Medical

## 2020-01-05 ENCOUNTER — Encounter: Payer: Self-pay | Admitting: Medical

## 2020-01-05 ENCOUNTER — Other Ambulatory Visit: Payer: Self-pay

## 2020-01-05 DIAGNOSIS — R109 Unspecified abdominal pain: Secondary | ICD-10-CM

## 2020-01-05 NOTE — Progress Notes (Signed)
   Subjective:    Patient ID: Angel [name], male    DOB: [birth year], 64 y.o.   MRN: [id number]  HPI  Virtual Visit via Video Note  I connected with Angel [name] on 01/05/20 at  2:00 PM EDT by a video enabled telemedicine application and verified that I am speaking with the correct person using two identifiers.  Location: Patient: home [place]. Provider: office. Participants- pt, myself and [name] partner.   I discussed the limitations of evaluation and management by telemedicine and the availability of in person appointments. The patient expressed understanding and agreed to proceed.  History of Present Illness:  Pt has history of hepatitis.  Pt is getting mri of abdomen near future.. He has same practice that is following his disease.   Pt has been having diarrhea with variety of signs and symptoms thought to be related to recent onset of seroquel. Pt psychiatrist office has dc'd/tapered him off seroquel/.   He has been using imodium since last week. I advised to use after stool studies came back negative  Pt states Friday was last dose of seroquel. 48 hours since last visit.   Considering referring to GI but wanted to see him this week wed in office for evaluation.  Pt has one bm today but now has mild abdomen pain.  He states if he stop immodoium then seems to have loose stool that reoccurs.     Observations/Objective: General-no acute distress, pleasant, oriented. Lungs- on inspection lungs appear unlabored. Neck- no tracheal deviation or jvd on inspection. Neuro- gross motor function appears intact.   Assessment and Plan: Counseled to hydrate well and eat bland foods.  Plan to have you taper off meds. Use immodium twice a day today. One tab tomorrow then stop.  Get abd xray today. On wed. Consider will get cmp, cbc and lipase panel. Might get h pylori studies.  Discussed diabetes and low sugar diet. Check sugar daily. Consider metformin but later until we work up abd  pain/loose stools. May refer to gi md.   Follow up Wednesday.  Follow Up Instructions:    I discussed the assessment and treatment plan with the patient. The patient was provided an opportunity to ask questions and all were answered. The patient agreed with the plan and demonstrated an understanding of the instructions.   The patient was advised to call back or seek an in-person evaluation if the symptoms worsen or if the condition fails to improve as anticipated.  I provided 25 minutes of non-face-to-face time during this encounter.   [name], PA-C    Review of Systems     Objective:   Physical Exam        Assessment & Plan:

## 2020-01-05 NOTE — Telephone Encounter (Signed)
Patient states that he need a script faxed to  [facility] [phone]  [phone] (phone) for the Diabetic kit

## 2020-01-05 NOTE — Patient Instructions (Addendum)
Counseled to hydrate well and eat bland foods.  Plan to have you taper off meds. Use immodium twice a day today. One tab tomorrow then stop.  Get abd xray today. On wed. Consider will get cmp, cbc and lipase panel. Might get h pylori studies.  Discussed diabetes and low sugar diet. Check sugar daily. Consider metformin but later until we work up abd pain/loose stools. May refer to gi md.   Follow up Wednesday.

## 2020-01-06 ENCOUNTER — Encounter: Payer: Self-pay | Admitting: Medical

## 2020-01-06 NOTE — Telephone Encounter (Signed)
Will discuss with pt tomorrow. 

## 2020-01-06 NOTE — Telephone Encounter (Signed)
Is it okay to send in a diabetic kit for the patient

## 2020-01-07 ENCOUNTER — Ambulatory Visit (INDEPENDENT_AMBULATORY_CARE_PROVIDER_SITE_OTHER): Payer: BC Managed Care – PPO | Admitting: Medical

## 2020-01-07 ENCOUNTER — Ambulatory Visit (HOSPITAL_BASED_OUTPATIENT_CLINIC_OR_DEPARTMENT_OTHER)
Admission: RE | Admit: 2020-01-07 | Discharge: 2020-01-07 | Disposition: A | Discharge status: Home / Self Care | Payer: BC Managed Care – PPO | Source: Ambulatory Visit | Attending: Medical | Admitting: Medical

## 2020-01-07 ENCOUNTER — Other Ambulatory Visit: Payer: Self-pay

## 2020-01-07 VITALS — BP 142/76 | HR 77 | Temp 97.7°F | Resp 18 | Ht 70.0 in | Wt 253.6 lb

## 2020-01-07 DIAGNOSIS — E119 Type 2 diabetes mellitus without complications: Secondary | ICD-10-CM

## 2020-01-07 DIAGNOSIS — I878 Other specified disorders of veins: Secondary | ICD-10-CM | POA: Diagnosis not present

## 2020-01-07 DIAGNOSIS — R109 Unspecified abdominal pain: Secondary | ICD-10-CM | POA: Insufficient documentation

## 2020-01-07 DIAGNOSIS — R110 Nausea: Secondary | ICD-10-CM | POA: Diagnosis not present

## 2020-01-07 DIAGNOSIS — R739 Hyperglycemia, unspecified: Secondary | ICD-10-CM | POA: Diagnosis not present

## 2020-01-07 DIAGNOSIS — R195 Other fecal abnormalities: Secondary | ICD-10-CM

## 2020-01-07 DIAGNOSIS — R197 Diarrhea, unspecified: Secondary | ICD-10-CM | POA: Diagnosis not present

## 2020-01-07 NOTE — Telephone Encounter (Signed)
[facility] medical specialty and they stated Gram is not a patient with them.

## 2020-01-07 NOTE — Progress Notes (Signed)
Subjective:    Patient ID: Angel [name], male    DOB: October 23, 1955, 64 y.o.   MRN: [id number]  HPI   Pt in for follow up.  Pt states he had good solid bm today. Then he had one loose stool today that he states was watery. Stool studies were negative. Abd xray today normal. Pt is cutting out high sugary foods.  Before I had prescribed immodium he was having 3 loose stools a day at least.   In past he had rare on day episodes of constipation then has regular bm.   No hx of any ibs.   Pt came off of seroquel completely now. None since Friday.  Pt has hx of elevated sugar and recent a1c above 7. Pt states he has to buy glucometer from supply. Pt was not given name of glucometer that insurance would provide.    Review of Systems  Constitutional: Negative for chills and fever.  Respiratory: Negative for cough, chest tightness, shortness of breath and wheezing.   Cardiovascular: Negative for chest pain and palpitations.  Gastrointestinal: Negative for abdominal pain.  Genitourinary: Negative for enuresis and flank pain.  Musculoskeletal: Negative for back pain, joint swelling and neck pain.  Skin: Negative for rash.  Neurological: Negative for dizziness, speech difficulty, weakness, light-headedness and headaches.  Hematological: Negative for adenopathy.  Psychiatric/Behavioral: Negative for behavioral problems and confusion.    Past Medical History:  Diagnosis Date  . Bipolar disorder ([place])   . 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 Depression   . GERD (gastroesophageal reflux disease)   . Headache   . Hepatitis B    Hepatitis E. antigen positive by history, status post treatment with Hepsera and baraclude   . Hepatitis B carrier ([place])   . Personal history of colonic polyps    Adenomas polyp 2008 and 2010, 2015   . Weight loss    Pt. has lost 10 lbs since pre-visit, intentionally with diet and walking     Social History   Socioeconomic History  . Marital  status: Married    Spouse name: [name]  . Number of children: 0  . Years of education: Not on file  . Highest education level: Master's degree (e.g., MA, MS, MEng, MEd, MSW, MBA)  Occupational History  . Occupation: retired    Fish farm manager: NOT EMPLOYED  Tobacco Use  . Smoking status: Former Smoker    Types: Cigarettes    Quit date: 05/08/2009    Years since quitting: 10.6  . Smokeless tobacco: Never Used  Vaping Use  . Vaping Use: Never used  Substance and Sexual Activity  . Alcohol use: No    Alcohol/week: 0.0 standard drinks  . Drug use: No  . Sexual activity: Not Currently  Other Topics Concern  . Not on file  Social History Narrative   Patient is right-handed. He is married to same sex partner. He gets little exercise.   Caffeine: 4 glasses of tea max/day   Social Determinants of Health   Financial Resource Strain:   . Difficulty of Paying Living Expenses: Not on file  Food Insecurity:   . Worried About Charity fundraiser in the Last Year: Not on file  . Ran Out of Food in the Last Year: Not on file  Transportation Needs:   . Lack of Transportation (Medical): Not on file  . Lack of Transportation (Non-Medical): Not on file  Physical Activity:   . Days of Exercise per Week: Not  on file  . Minutes of Exercise per Session: Not on file  Stress:   . Feeling of Stress : Not on file  Social Connections:   . Frequency of Communication with Friends and Family: Not on file  . Frequency of Social Gatherings with Friends and Family: Not on file  . Attends Religious Services: Not on file  . Active Member of Clubs or Organizations: Not on file  . Attends Archivist Meetings: Not on file  . Marital Status: Not on file  Intimate Partner Violence:   . Fear of Current or Ex-Partner: Not on file  . Emotionally Abused: Not on file  . Physically Abused: Not on file  . Sexually Abused: Not on file    Past Surgical History:  Procedure Laterality Date  . APPENDECTOMY     . COLONOSCOPY    . POLYPECTOMY    . UPPER GASTROINTESTINAL ENDOSCOPY      Family History  Problem Relation Age of Onset  . Heart disease Mother   . Diabetes Father   . Kidney failure Father   . Kidney disease Father   . Other Sister        GERD  . Colon cancer Neg Hx   . Rectal cancer Neg Hx   . Stomach cancer Neg Hx   . Esophageal cancer Neg Hx   . Headache Neg Hx   . Migraines Neg Hx     Allergies  Allergen Reactions  . Aripiprazole Other (See Comments)    Tardive Dyskinesia  . Invega [Paliperidone Er] Other (See Comments)    Tardive dyskinsia symptoms  . Abilify [Aripiprazole]   . Lamictal [Lamotrigine] Rash  . Lexapro [Escitalopram Oxalate] Other (See Comments)    Doesn't Work    Current Outpatient Medications on File Prior to Visit  Medication Sig Dispense Refill  . bisacodyl (BISACODYL) 5 MG EC tablet TAKE 1 TABLET BY MOUTH EVERY DAY AS NEEDED FOR MODERATE CONSTIPATION 90 tablet 2  . divalproex (DEPAKOTE ER) 250 MG 24 hr tablet 1 bid 60 tablet 5  . famciclovir (FAMVIR) 500 MG tablet Take 1 tablet (500 mg total) by mouth 3 (three) times daily. 21 tablet 0  . lithium 600 MG capsule TAKE 1 CAPSULE BY MOUTH EVERY MORNING AND 2 CAPSULES EVERY NIGHT AT BEDTIME 90 capsule 5  . Melatonin 10 MG SUBL Place 10 mg under the tongue at bedtime.    . methocarbamol (ROBAXIN) 750 MG tablet Take 1 tablet (750 mg total) by mouth every 8 (eight) hours as needed for muscle spasms. 90 tablet 0  . methylPREDNISolone (MEDROL DOSEPAK) 4 MG TBPK tablet 6 day dose pack take a directed (Patient not taking: Reported on 12/24/2019) 21 tablet 0  . nadolol (CORGARD) 40 MG tablet TAKE 1 TABLET BY MOUTH EVERY DAY 30 tablet 0  . Tenofovir Alafenamide Fumarate (VEMLIDY) 25 MG TABS Take by mouth.    . tizanidine (ZANAFLEX) 2 MG capsule Take 1-2 capsules (2-4 mg total) by mouth 3 (three) times daily as needed for muscle spasms. 60 capsule 1   No current facility-administered medications on file prior  to visit.    BP (!) 142/76   Pulse 77   Temp 97.7 F (36.5 C)   Resp 18   Ht 5\' 10"  (1.778 m)   Wt 253 lb 9.6 oz (115 kg)   SpO2 98%   BMI 36.39 kg/m       Objective:   Physical Exam  General Mental Status- Alert. General Appearance-  Not in acute distress.     Chest and Lung Exam Auscultation: Breath Sounds:-Normal.  Cardiovascular Auscultation:Rythm- Regular. Murmurs & Other Heart Sounds:Auscultation of the heart reveals- No Murmurs.  Abdomen Inspection:-Inspeection Normal. Palpation/Percussion:Note:No mass. Palpation and Percussion of the abdomen reveal- Non Tender, Non Distended + BS, no rebound or guarding.   Neurologic Cranial Nerve exam:- CN III-XII intact(No nystagmus), symmetric smile. Strength:- 5/5 equal and symmetric strength both upper and lower extremities.      Assessment & Plan:  Intermittent episodes of diarrhea with associated abdomen pain during the bowel movements.  Overall you describe that your loose stools have gotten some better since stopping Seroquel.  The most frequent see BMs/loose stools seem to be when you are on higher dose Seroquel.  You are now tapered off Seroquel completely since Friday.  1 good BM this morning followed by loose stool later.  Your x-ray of abdomen shows no acute abnormality.  No significant findings particularly no report of any stool burden present.  I think the best way to approach this is to have you start Metamucil 1 rounded tablespoon in 8 ounces of water 3 times daily.  We will go ahead and refer you to gastroenterologist.  Give me an update this Friday as to whether or not you have the stools are worsening.  If so then might consider prescribing IBS-D medication.  Note recent stool studies were negative.  Diabetes diagnosis since A1c is above 7.  Recommend low sugar diet and check sugars twice daily as discussed.  Might consider prescribing low-dose Metformin in the near future but first want you to get over  recent GI symptoms as Metformin can cause loose stools.  Asking my medical assistant to fax over order for glucometer and associated supplies.   Follow-up date to be determined after lab review.  [name], PA-C   Time spent with patient today was  40 minutes which consisted of chart review, discussing diagnosis, work up, treatment, answering questions, placing referral and documentation.

## 2020-01-07 NOTE — Patient Instructions (Signed)
Intermittent episodes of diarrhea with associated abdomen pain during the bowel movements.  Overall you describe that your loose stools have gotten some better since stopping Seroquel.  The most frequent see BMs/loose stools seem to be when you are on higher dose Seroquel.  You are now tapered off Seroquel completely since Friday.  1 good BM this morning followed by loose stool later.  Your x-ray of abdomen shows no acute abnormality.  No significant findings particularly no report of any stool burden present.  I think the best way to approach this is to have you start Metamucil 1 rounded tablespoon in 8 ounces of water 3 times daily.  We will go ahead and refer you to gastroenterologist.  Give me an update this Friday as to whether or not you have the stools are worsening.  If so then might consider prescribing IBS-D medication.  Note recent stool studies were negative.  Diabetes diagnosis since A1c is above 7.  Recommend low sugar diet and check sugars twice daily as discussed.  Might consider prescribing low-dose Metformin in the near future but first want you to get over recent GI symptoms as Metformin can cause loose stools.  Asking my medical assistant to fax over order for glucometer and associated supplies.   Follow-up date to be determined after lab review.

## 2020-01-07 NOTE — Telephone Encounter (Signed)
This does not make sense as this is the number that pt was instructed by his insurance to call to get glucometer. So did you explain  them this?  Do they have some sort of contractual agreement with [employer]? Why did they instruct pt to use [employer].  Pt was not given name of glucometer.  So send accucheck glucometer with supplies for  strips and lancets. Instruction  would be to check sugars tid.

## 2020-01-08 ENCOUNTER — Encounter: Payer: Self-pay | Admitting: Medical

## 2020-01-08 ENCOUNTER — Encounter: Payer: Self-pay | Admitting: Gastroenterology

## 2020-01-08 ENCOUNTER — Telehealth: Payer: Self-pay | Admitting: Medical

## 2020-01-08 LAB — CBC WITH DIFFERENTIAL/PLATELET
Absolute Monocytes: 437 cells/uL (ref 200–950)
Basophils Absolute: 19 cells/uL (ref 0–200)
Basophils Relative: 0.5 %
Eosinophils Absolute: 41 cells/uL (ref 15–500)
Eosinophils Relative: 1.1 %
HCT: 38.5 % (ref 38.5–50.0)
Hemoglobin: 13 g/dL — ABNORMAL LOW (ref 13.2–17.1)
Lymphs Abs: 903 cells/uL (ref 850–3900)
MCH: 31.9 pg (ref 27.0–33.0)
MCHC: 33.8 g/dL (ref 32.0–36.0)
MCV: 94.6 fL (ref 80.0–100.0)
MPV: 11.1 fL (ref 7.5–12.5)
Monocytes Relative: 11.8 %
Neutro Abs: 2301 cells/uL (ref 1500–7800)
Neutrophils Relative %: 62.2 %
Platelets: 80 10*3/uL — ABNORMAL LOW (ref 140–400)
RBC: 4.07 10*6/uL — ABNORMAL LOW (ref 4.20–5.80)
RDW: 12.9 % (ref 11.0–15.0)
Total Lymphocyte: 24.4 %
WBC: 3.7 10*3/uL — ABNORMAL LOW (ref 3.8–10.8)

## 2020-01-08 LAB — LIPASE: Lipase: 40 U/L (ref 7–60)

## 2020-01-08 MED ORDER — ACCU-CHEK AVIVA PLUS W/DEVICE KIT: PACK | 0 refills | Status: DC

## 2020-01-08 MED ORDER — ACCU-CHEK AVIVA PLUS VI STRP: ORAL_STRIP | 1 refills | Status: DC

## 2020-01-08 MED ORDER — HYOSCYAMINE SULFATE 0.125 MG PO TABS: ORAL_TABLET | ORAL | 0 refills | Status: DC

## 2020-01-08 MED ORDER — ACCU-CHEK SOFTCLIX LANCETS MISC: 1 refills | Status: DC

## 2020-01-08 MED ORDER — ACCU-CHEK SOFTCLIX LANCETS MISC
1 refills | Status: DC
Start: 2020-01-08 — End: 2020-01-08

## 2020-01-08 NOTE — Telephone Encounter (Signed)
Script faxed to [facility] .

## 2020-01-08 NOTE — Telephone Encounter (Signed)
[facility]

## 2020-01-08 NOTE — Telephone Encounter (Signed)
Rx levsin sent to pt pharmacy.

## 2020-01-08 NOTE — Telephone Encounter (Signed)
[facility] called stating that they are unable to fill the script for the diabetic kit. He states that referral/script may need to be sent somewhere else. Per the corp office they are unsure of when they will get any more in.

## 2020-01-08 NOTE — Addendum Note (Signed)
Addended by: Jeronimo Greaves on: 01/08/2020 04:07 PM   Modules accepted: Orders

## 2020-01-08 NOTE — Telephone Encounter (Signed)
OPENED IN ERROR

## 2020-01-09 ENCOUNTER — Encounter: Payer: Self-pay | Admitting: Medical

## 2020-01-09 ENCOUNTER — Other Ambulatory Visit: Payer: Self-pay

## 2020-01-09 MED ORDER — ACCU-CHEK AVIVA PLUS VI STRP: ORAL_STRIP | 1 refills | Status: DC

## 2020-01-09 MED ORDER — ACCU-CHEK SOFTCLIX LANCETS MISC: 1 refills | Status: DC

## 2020-01-09 MED ORDER — ACCU-CHEK AVIVA PLUS W/DEVICE KIT: PACK | 0 refills | Status: AC

## 2020-01-10 ENCOUNTER — Encounter: Payer: Self-pay | Admitting: Medical

## 2020-01-10 DIAGNOSIS — B181 Chronic viral hepatitis B without delta-agent: Principal | ICD-10-CM

## 2020-01-10 DIAGNOSIS — K7469 Other cirrhosis of liver: Principal | ICD-10-CM

## 2020-01-12 ENCOUNTER — Telehealth: Payer: BC Managed Care – PPO | Admitting: Nurse Practitioner

## 2020-01-12 DIAGNOSIS — J029 Acute pharyngitis, unspecified: Secondary | ICD-10-CM | POA: Diagnosis not present

## 2020-01-12 NOTE — Progress Notes (Signed)

## 2020-01-13 ENCOUNTER — Ambulatory Visit
Admission: RE | Admit: 2020-01-13 | Discharge: 2020-01-13 | Disposition: A | Discharge status: Home / Self Care | Payer: BC Managed Care – PPO | Source: Ambulatory Visit | Attending: Specialist | Admitting: Specialist

## 2020-01-13 ENCOUNTER — Other Ambulatory Visit: Payer: Self-pay

## 2020-01-13 ENCOUNTER — Encounter: Payer: Self-pay | Admitting: Medical

## 2020-01-13 DIAGNOSIS — K7469 Other cirrhosis of liver: Principal | ICD-10-CM

## 2020-01-13 DIAGNOSIS — B181 Chronic viral hepatitis B without delta-agent: Principal | ICD-10-CM

## 2020-01-13 DIAGNOSIS — M4802 Spinal stenosis, cervical region: Secondary | ICD-10-CM | POA: Diagnosis not present

## 2020-01-13 DIAGNOSIS — M4722 Other spondylosis with radiculopathy, cervical region: Secondary | ICD-10-CM

## 2020-01-13 DIAGNOSIS — M5412 Radiculopathy, cervical region: Secondary | ICD-10-CM

## 2020-01-13 DIAGNOSIS — M542 Cervicalgia: Secondary | ICD-10-CM

## 2020-01-13 DIAGNOSIS — M25511 Pain in right shoulder: Secondary | ICD-10-CM

## 2020-01-13 MED ORDER — VEMLIDY 25 MG TABLET
ORAL_TABLET | Freq: Every day | ORAL | 3 refills | 90.00000 days | Status: CP
Start: 2020-01-13 — End: 2021-01-07

## 2020-01-14 ENCOUNTER — Encounter: Payer: Self-pay | Admitting: Medical

## 2020-01-14 ENCOUNTER — Telehealth: Payer: Self-pay | Admitting: Medical

## 2020-01-14 ENCOUNTER — Ambulatory Visit: Admit: 2020-01-14 | Discharge: 2020-01-15 | Payer: MEDICARE

## 2020-01-14 DIAGNOSIS — K7469 Other cirrhosis of liver: Secondary | ICD-10-CM | POA: Diagnosis not present

## 2020-01-14 DIAGNOSIS — R197 Diarrhea, unspecified: Secondary | ICD-10-CM | POA: Diagnosis not present

## 2020-01-14 DIAGNOSIS — E119 Type 2 diabetes mellitus without complications: Secondary | ICD-10-CM

## 2020-01-14 DIAGNOSIS — K7460 Unspecified cirrhosis of liver: Secondary | ICD-10-CM | POA: Diagnosis not present

## 2020-01-14 DIAGNOSIS — K766 Portal hypertension: Secondary | ICD-10-CM | POA: Diagnosis not present

## 2020-01-14 DIAGNOSIS — B181 Chronic viral hepatitis B without delta-agent: Secondary | ICD-10-CM | POA: Diagnosis not present

## 2020-01-14 NOTE — Telephone Encounter (Signed)
Opened in error

## 2020-01-14 NOTE — Telephone Encounter (Signed)
Pt updated me that he does see new gi locally.  Will see liver specialist tonight.  Regarding st explained looks like I have appointment on Friday. Possible 1:20 pm.  Also pt agreed to endocrinologist and diabetic education.  Also explained to pt Dr. Havery Moros was not aware of details of pt conversation with receptionist staff.

## 2020-01-14 NOTE — Telephone Encounter (Signed)
Patient will go around and try to find rapid test that is in the store.

## 2020-01-14 NOTE — Telephone Encounter (Signed)
Tried twice to talk with pt at lunch. Called both his cell and home no answer. Wanted to discuss my chart message since lengthy.  May try 3rd time as well.

## 2020-01-14 NOTE — Telephone Encounter (Signed)
Referral for diabetes education and endocrinologist place.

## 2020-01-15 ENCOUNTER — Telehealth: Payer: Self-pay | Admitting: Medical

## 2020-01-15 ENCOUNTER — Telehealth: Payer: Self-pay

## 2020-01-15 NOTE — Telephone Encounter (Signed)
[facility] states [facility] do not cover medication    glucose blood (ACCU-CHEK AVIVA PLUS) test strip [[id number]]

## 2020-01-15 NOTE — Telephone Encounter (Signed)
[facility] called stating they don't cover ACCU-CHEK so given Verbal to fill [facility] with same instruction.

## 2020-01-15 NOTE — Telephone Encounter (Signed)
New script sent [facility] unaware if patient picked up script but sent in new script through [facility]

## 2020-01-16 ENCOUNTER — Other Ambulatory Visit: Payer: Self-pay

## 2020-01-16 ENCOUNTER — Telehealth (INDEPENDENT_AMBULATORY_CARE_PROVIDER_SITE_OTHER): Payer: BC Managed Care – PPO | Admitting: Medical

## 2020-01-16 ENCOUNTER — Encounter: Payer: Self-pay | Admitting: Medical

## 2020-01-16 DIAGNOSIS — J029 Acute pharyngitis, unspecified: Secondary | ICD-10-CM

## 2020-01-16 DIAGNOSIS — G4700 Insomnia, unspecified: Secondary | ICD-10-CM | POA: Diagnosis not present

## 2020-01-16 DIAGNOSIS — F319 Bipolar disorder, unspecified: Secondary | ICD-10-CM | POA: Diagnosis not present

## 2020-01-16 DIAGNOSIS — G2401 Drug induced subacute dyskinesia: Secondary | ICD-10-CM | POA: Diagnosis not present

## 2020-01-16 LAB — POCT RAPID STREP A (OFFICE): Rapid Strep A Screen: NEGATIVE

## 2020-01-16 NOTE — Addendum Note (Signed)
Addended by: Jeronimo Greaves on: 01/16/2020 02:45 PM   Modules accepted: Orders

## 2020-01-16 NOTE — Progress Notes (Signed)
° °  Subjective:    Patient ID: Angel [name], male    DOB: 30-Oct-1955, 64 y.o.   MRN: [id number]  HPI  Virtual Visit via Video Note  I connected with [name] on 01/16/20 at  1:20 PM EDT by a video enabled telemedicine application and verified that I am speaking with the correct person using two identifiers.  Location: Patient: car from parking lot. Provider: office.   I discussed the limitations of evaluation and management by telemedicine and the availability of in person appointments. The patient expressed understanding and agreed to proceed.  History of Present Illness:   Pt updates me that he will get egd and colonoscopy on this Monday.   Pt throat pain is gone today. He has no swollen glands. Pt took xycam, airborn and 2-3 halls. He states more than a day and pain has not returned.  Pt never was able to do find rapid [facility] test [facility]. He did find test thru [facility] which he had to pay $ dollars for. Test result is not back.  Pt does not have covid test results.  Pt denies any pnd.   Throat pain the other day did seem to get better with mylanta. Wanted him to come in to actual visualize throat. Did virtual virst then went down stairs to see throat and do rapid.         Observations/Objective: General-no acute distress, pleasant, oriented. Lungs- on inspection lungs appear unlabored.(in person. Went to parking lot. Neck- no tracheal deviation or jvd on inspection. Neuro- gross motor function appears intact. Throat- in person. Mild faint red. No obvious hypertrophy. Did rapid strep.   Assessment and Plan: I do think that your recent sore throat might be reflux related.  You report a couple of times that symptoms resolved with Mylanta.  I did a rapid strep today to see if possible strep.  Doubt that result will be positive.  We will update you when the test is done.  Glad to hear that you are getting the EGD done on Monday.  The give opinion on whether or not  you had recent reflux.  For recent 1 month or more of loose stools you are getting colonoscopy Monday as well.  This will help guide potential treatment.  We will see what gastroenterologist thinks/recommends.  Notify me of your COVID test that was recently sent out.  Results are pending.  Follow-up date to be determined after reviewing GI study reports.  Follow Up Instructions:    I discussed the assessment and treatment plan with the patient. The patient was provided an opportunity to ask questions and all were answered. The patient agreed with the plan and demonstrated an understanding of the instructions.   The patient was advised to call back or seek an in-person evaluation if the symptoms worsen or if the condition fails to improve as anticipated.  I provided 20 minutes of non-face-to-face time during this encounter.   [name], PA-C   Review of Systems     Objective:   Physical Exam        Assessment & Plan:

## 2020-01-16 NOTE — Patient Instructions (Signed)
I do think that your recent sore throat might be reflux related.  You report a couple of times that symptoms resolved with Mylanta.  I did a rapid strep today to see if possible strep.  Doubt that result will be positive.  We will update you when the test is done.  Glad to hear that you are getting the EGD done on Monday.  The give opinion on whether or not you had recent reflux.  For recent 1 month or more of loose stools you are getting colonoscopy Monday as well.  This will help guide potential treatment.  We will see what gastroenterologist thinks/recommends.  Notify me of your COVID test that was recently sent out.  Results are pending.  Follow-up date to be determined after reviewing GI study reports.

## 2020-01-19 ENCOUNTER — Encounter: Payer: Self-pay | Admitting: Medical

## 2020-01-19 DIAGNOSIS — K766 Portal hypertension: Secondary | ICD-10-CM | POA: Diagnosis not present

## 2020-01-19 DIAGNOSIS — K641 Second degree hemorrhoids: Secondary | ICD-10-CM | POA: Diagnosis not present

## 2020-01-19 DIAGNOSIS — K7460 Unspecified cirrhosis of liver: Secondary | ICD-10-CM | POA: Diagnosis not present

## 2020-01-19 DIAGNOSIS — K3189 Other diseases of stomach and duodenum: Secondary | ICD-10-CM | POA: Diagnosis not present

## 2020-01-19 DIAGNOSIS — I8510 Secondary esophageal varices without bleeding: Secondary | ICD-10-CM | POA: Diagnosis not present

## 2020-01-19 DIAGNOSIS — R197 Diarrhea, unspecified: Secondary | ICD-10-CM | POA: Diagnosis not present

## 2020-01-19 DIAGNOSIS — D122 Benign neoplasm of ascending colon: Secondary | ICD-10-CM | POA: Diagnosis not present

## 2020-01-20 ENCOUNTER — Telehealth: Payer: Self-pay | Admitting: Medical

## 2020-01-20 NOTE — Telephone Encounter (Signed)
I don't see any egd or colonoscopy reports. Also would ask pt get specialist note faxed over. Typically the GI MD would be recommending medication at this point if studies negative. They would make the choice on potential ibs-d meds.   I would prefer to defer to them. But if not getting any direction from that office then I might rx new med.

## 2020-01-20 NOTE — Telephone Encounter (Signed)
Report placed in basket.

## 2020-01-20 NOTE — Telephone Encounter (Signed)
Patient states he has spoken with Ed Saguier previously about IBS. He had colonoscopy and endoscopy and has faxed results here. Patient would like RX for IBS, today. He has a mychart visit scheduled for tomorrow but he wants to start the IBS medication today. Please sent to [facility] on [address].

## 2020-01-21 ENCOUNTER — Telehealth (INDEPENDENT_AMBULATORY_CARE_PROVIDER_SITE_OTHER): Payer: BC Managed Care – PPO | Admitting: Medical

## 2020-01-21 ENCOUNTER — Encounter: Payer: Self-pay | Admitting: Medical

## 2020-01-21 ENCOUNTER — Other Ambulatory Visit: Payer: Self-pay

## 2020-01-21 DIAGNOSIS — K580 Irritable bowel syndrome with diarrhea: Secondary | ICD-10-CM

## 2020-01-21 MED ORDER — DICYCLOMINE HCL 10 MG PO CAPS: 10.0000 mg | ORAL_CAPSULE | Freq: Three times a day (TID) | ORAL | 0 refills | Status: DC

## 2020-01-21 NOTE — Patient Instructions (Addendum)
Since your stool studies are negative and no cause found by GI MD, I do think you may have ibs-diarrhea.  Rx Bentyl, use probiotic  over the counter, udate me in one week how you are are doig  And up  GI MD in one week. I want GI opinion on if IBS-D being considered. If not responding to bentyl want you to follow up with GI MD.  Keep appointment with Dr. Kelton Pillar. Asking Angel [name] to follow up on the referral.  Follow up 2 weeks

## 2020-01-21 NOTE — Progress Notes (Signed)
° °  Subjective:    Patient ID: Angel [name], male    DOB: July 19, 1955, 64 y.o.   MRN: [id number]  HPI Virtual Visit via Video Note  I connected with Angel [name] on 01/21/20 at  1:00 PM EDT by a video enabled telemedicine application and verified that I am speaking with the correct person using two identifiers.  Location: Patient: home Provider: office Participants- pt and myself.  Pt did not check vitals today. Before procedure other day. 115/79. Pulse was 91.   I discussed the limitations of evaluation and management by telemedicine and the availability of in person appointments. The patient expressed understanding and agreed to proceed.  History of Present Illness: Pt in for follow up.  Pt had recent daily loose stools for more than a month. Thought to have been originally possible side effect of seroquel. But that has stopped yet still has symptoms.   Egd and colonoscopy. Only 1 polyp and that is being biopsied. Pt is still having loose stools about 3-5 a day. Stool studies have been negative.   I gave levsin pending above studies. Pt states helped with nausea but did not help with diarrhea.    Observations/Objective: General-no acute distress, pleasant, oriented. Lungs- on inspection lungs appear unlabored. Neck- no tracheal deviation or jvd on inspection. Neuro- gross motor function appears intact.  Assessment and Plan: Since your stool studies are negative and no cause found by GI MD, I do think you may have ibs-diarrhea.  Rx Bentyl, use probiotic  over the counter, udate me in one week how you are are doig  And up  GI MD in one week. I want GI opinion on if IBS-D being considered. If not responding to bentyl want you to follow up with GI MD.  Keep appointment with Dr. Kelton Pillar. Asking Sherri to follow up on the referral.  Follow up 2 weeks  [name], PA-C  Follow Up Instructions:    I discussed the assessment and treatment plan with the patient. The  patient was provided an opportunity to ask questions and all were answered. The patient agreed with the plan and demonstrated an understanding of the instructions.   The patient was advised to call back or seek an in-person evaluation if the symptoms worsen or if the condition fails to improve as anticipated.  I provided 25 minutes of non-face-to-face time during this encounter.   [name], PA-C    Review of Systems     Objective:   Physical Exam        Assessment & Plan:

## 2020-01-22 ENCOUNTER — Encounter: Payer: Self-pay | Admitting: Specialist

## 2020-01-22 ENCOUNTER — Other Ambulatory Visit: Payer: Self-pay

## 2020-01-22 ENCOUNTER — Ambulatory Visit (INDEPENDENT_AMBULATORY_CARE_PROVIDER_SITE_OTHER): Payer: BC Managed Care – PPO | Admitting: Specialist

## 2020-01-22 VITALS — BP 169/74 | HR 66 | Ht 70.0 in | Wt 254.0 lb

## 2020-01-22 DIAGNOSIS — M25511 Pain in right shoulder: Secondary | ICD-10-CM

## 2020-01-22 DIAGNOSIS — M4722 Other spondylosis with radiculopathy, cervical region: Secondary | ICD-10-CM

## 2020-01-22 DIAGNOSIS — M542 Cervicalgia: Secondary | ICD-10-CM | POA: Diagnosis not present

## 2020-01-22 DIAGNOSIS — M5412 Radiculopathy, cervical region: Secondary | ICD-10-CM

## 2020-01-22 MED ORDER — GABAPENTIN 100 MG PO CAPS: 100.0000 mg | ORAL_CAPSULE | Freq: Every day | ORAL | 3 refills | Status: DC

## 2020-01-22 NOTE — Patient Instructions (Signed)
Avoid overhead lifting and overhead use of the arms. Do not lift greater than 5 lbs. Adjust head rest in vehicle to prevent hyperextension if rear ended. Take extra precautions to avoid falling.  Handicap license is approved. Dr. Romona Curls secretary/Assistant will call to arrange for epidural steroid injection

## 2020-01-22 NOTE — Progress Notes (Signed)
Office Visit Note   Patient: Angel [name]           Date of Birth: [birth year]           MRN: [id number] Visit Date: 01/22/2020              Requested by: [name], PA-C [employer] [place],   [id number] PCP: [name], PA-C   Assessment & Plan: Visit Diagnoses:  1. Other spondylosis with radiculopathy, cervical region   2. Cervicalgia   3. Radiculopathy, cervical region   4. Right shoulder pain, unspecified chronicity     Plan: Avoid overhead lifting and overhead use of the arms. Do not lift greater than 5 lbs. Adjust head rest in vehicle to prevent hyperextension if rear ended. Take extra precautions to avoid falling.  Handicap license is approved. Dr. Romona Curls secretary/Assistant will call to arrange for epidural steroid injection   Follow-Up Instructions: Return in about 3 weeks (around 02/12/2020).   Orders:  No orders of the defined types were placed in this encounter.  No orders of the defined types were placed in this encounter.     Procedures: No procedures performed   Clinical Data: No additional findings.   Subjective: Chief Complaint  Patient presents with  . Neck - Follow-up    MRI Review--    64 year old male with history of neck pain with radiation into the right arm and weakness into the right arm. He is dropping items with the right arm and also has fallen in the past. He is having difficutly with diarrhea scheduled for endoscopy done this past Monday. No bladder difficulty. There is no pain with coughing or sneezing. He is exercising, walking up to 1/2 mile daily and building up  Distance. Took seraquel but thought it caused hypoglycemia. Glucose 132.    Review of Systems  Constitutional: Negative.   HENT: Negative.   Eyes: Negative.  Negative for photophobia, pain, discharge, redness and itching.  Respiratory: Negative.  Negative for apnea, cough, choking, chest tightness, shortness of breath and  stridor.   Cardiovascular: Negative.  Negative for chest pain, palpitations and leg swelling.  Gastrointestinal: Positive for abdominal distention, abdominal pain and diarrhea. Negative for anal bleeding, blood in stool and constipation.  Endocrine: Negative.  Negative for cold intolerance, heat intolerance, polydipsia, polyphagia and polyuria.  Genitourinary: Negative.  Negative for difficulty urinating, discharge, dysuria, enuresis, flank pain, frequency, genital sores, hematuria and penile swelling.  Musculoskeletal: Positive for back pain, neck pain and neck stiffness. Negative for gait problem, joint swelling and myalgias.  Skin: Negative.  Negative for color change, pallor, rash and wound.  Allergic/Immunologic: Negative.  Negative for environmental allergies, food allergies and immunocompromised state.  Neurological: Positive for weakness and numbness. Negative for dizziness, tremors, seizures, syncope, facial asymmetry, speech difficulty, light-headedness and headaches.  Hematological: Negative.  Negative for adenopathy. Does not bruise/bleed easily.  Psychiatric/Behavioral: Negative.  Negative for agitation, behavioral problems, confusion, decreased concentration, dysphoric mood, hallucinations, self-injury, sleep disturbance and suicidal ideas. The patient is not nervous/anxious and is not hyperactive.      Objective: Vital Signs: BP (!) 169/74 (BP Location: Left Arm, Patient Position: Sitting)   Pulse 66   Ht 5\' 10"  (1.778 m)   Wt 254 lb (115.2 kg)   BMI 36.45 kg/m   Physical Exam Musculoskeletal:     Lumbar back: Positive right straight leg raise test and positive left straight leg raise test.     Back  Exam   Tenderness  The patient is experiencing tenderness in the cervical.  Range of Motion  Extension: abnormal  Flexion: abnormal  Lateral bend right: abnormal  Lateral bend left: abnormal  Rotation right: abnormal  Rotation left: abnormal   Muscle Strength    Right Quadriceps:  5/5  Left Quadriceps:  5/5  Right Hamstrings:  5/5  Left Hamstrings:  5/5   Tests  Straight leg raise right: positive Straight leg raise left: positive  Other  Toe walk: normal Heel walk: normal Sensation: normal Gait: normal  Erythema: no back redness Scars: absent      Specialty Comments:  No specialty comments available.  Imaging: No results found.   PMFS History: Patient Active Problem List   Diagnosis Date Noted  . Cervicogenic headache 12/09/2019  . Occipital neuralgia of left side 04/10/2019  . Hyperglycemia 10/07/2013  . Hepatic cirrhosis ([place]) 10/03/2013  . Bipolar I disorder, most recent episode (or current) depressed, unspecified 05/21/2013  . Hepatitis B 03/21/2013  . Esophageal reflux 03/21/2013  . Personal history of colonic polyps 03/21/2013  . Bipolar disorder, unspecified ([place]) 09/03/2011   Past Medical History:  Diagnosis Date  . Bipolar disorder ([place])   . 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 Depression   . GERD (gastroesophageal reflux disease)   . Headache   . Hepatitis B    Hepatitis E. antigen positive by history, status post treatment with Hepsera and baraclude   . Hepatitis B carrier ([place])   . Personal history of colonic polyps    Adenomas polyp 2008 and 2010, 2015   . Weight loss    Pt. has lost 10 lbs since pre-visit, intentionally with diet and walking    Family History  Problem Relation Age of Onset  . Heart disease Mother   . Diabetes Father   . Kidney failure Father   . Kidney disease Father   . Other Sister        GERD  . Colon cancer Neg Hx   . Rectal cancer Neg Hx   . Stomach cancer Neg Hx   . Esophageal cancer Neg Hx   . Headache Neg Hx   . Migraines Neg Hx     Past Surgical History:  Procedure Laterality Date  . APPENDECTOMY    . COLONOSCOPY    . POLYPECTOMY    . UPPER GASTROINTESTINAL ENDOSCOPY     Social History   Occupational History  . Occupation: retired     Fish farm manager: NOT EMPLOYED  Tobacco Use  . Smoking status: Former Smoker    Types: Cigarettes    Quit date: 05/08/2009    Years since quitting: 10.7  . Smokeless tobacco: Never Used  Vaping Use  . Vaping Use: Never used  Substance and Sexual Activity  . Alcohol use: No    Alcohol/week: 0.0 standard drinks  . Drug use: No  . Sexual activity: Not Currently

## 2020-01-23 NOTE — Telephone Encounter (Signed)
Patient is scheduled with Dr. Kelton Pillar as a new patient on 02/10/2020.

## 2020-01-26 DIAGNOSIS — B181 Chronic viral hepatitis B without delta-agent: Principal | ICD-10-CM

## 2020-01-26 DIAGNOSIS — K766 Portal hypertension: Principal | ICD-10-CM

## 2020-01-26 DIAGNOSIS — K7469 Other cirrhosis of liver: Principal | ICD-10-CM

## 2020-01-26 DIAGNOSIS — I8510 Secondary esophageal varices without bleeding: Principal | ICD-10-CM

## 2020-01-28 DIAGNOSIS — F319 Bipolar disorder, unspecified: Secondary | ICD-10-CM | POA: Diagnosis not present

## 2020-01-28 DIAGNOSIS — E139 Other specified diabetes mellitus without complications: Secondary | ICD-10-CM | POA: Insufficient documentation

## 2020-01-28 DIAGNOSIS — K7460 Unspecified cirrhosis of liver: Secondary | ICD-10-CM | POA: Diagnosis not present

## 2020-01-28 DIAGNOSIS — B181 Chronic viral hepatitis B without delta-agent: Secondary | ICD-10-CM | POA: Diagnosis not present

## 2020-01-28 DIAGNOSIS — Z1289 Encounter for screening for malignant neoplasm of other sites: Secondary | ICD-10-CM | POA: Diagnosis not present

## 2020-01-29 DIAGNOSIS — R140 Abdominal distension (gaseous): Secondary | ICD-10-CM | POA: Diagnosis not present

## 2020-01-29 DIAGNOSIS — K7469 Other cirrhosis of liver: Secondary | ICD-10-CM | POA: Diagnosis not present

## 2020-01-29 DIAGNOSIS — R197 Diarrhea, unspecified: Secondary | ICD-10-CM | POA: Diagnosis not present

## 2020-01-29 DIAGNOSIS — I8510 Secondary esophageal varices without bleeding: Secondary | ICD-10-CM | POA: Diagnosis not present

## 2020-01-31 DIAGNOSIS — R197 Diarrhea, unspecified: Secondary | ICD-10-CM | POA: Diagnosis not present

## 2020-01-31 DIAGNOSIS — E119 Type 2 diabetes mellitus without complications: Secondary | ICD-10-CM | POA: Diagnosis not present

## 2020-01-31 DIAGNOSIS — G4700 Insomnia, unspecified: Secondary | ICD-10-CM | POA: Diagnosis not present

## 2020-01-31 DIAGNOSIS — F319 Bipolar disorder, unspecified: Secondary | ICD-10-CM | POA: Diagnosis not present

## 2020-02-10 ENCOUNTER — Other Ambulatory Visit: Payer: Self-pay

## 2020-02-10 ENCOUNTER — Encounter: Payer: Self-pay | Admitting: Internal Medicine

## 2020-02-10 ENCOUNTER — Ambulatory Visit (INDEPENDENT_AMBULATORY_CARE_PROVIDER_SITE_OTHER): Payer: BC Managed Care – PPO | Admitting: Internal Medicine

## 2020-02-10 VITALS — BP 134/80 | HR 54 | Ht 70.0 in | Wt 247.0 lb

## 2020-02-10 DIAGNOSIS — E1142 Type 2 diabetes mellitus with diabetic polyneuropathy: Secondary | ICD-10-CM

## 2020-02-10 DIAGNOSIS — R739 Hyperglycemia, unspecified: Secondary | ICD-10-CM | POA: Diagnosis not present

## 2020-02-10 DIAGNOSIS — E781 Pure hyperglyceridemia: Secondary | ICD-10-CM | POA: Diagnosis not present

## 2020-02-10 LAB — POCT GLYCOSYLATED HEMOGLOBIN (HGB A1C): Hemoglobin A1C: 6.7 % — AB (ref 4.0–5.6)

## 2020-02-10 MED ORDER — LANCING DEVICE MISC: 1.0000 | 1 refills | Status: DC

## 2020-02-10 MED ORDER — GLUCOSE BLOOD VI STRP: 1.0000 | ORAL_STRIP | Freq: Two times a day (BID) | 12 refills | Status: DC

## 2020-02-10 NOTE — Patient Instructions (Signed)
-   Stop by the lab today  - Please discuss with your liver specialist , if statins ( cholesterol lowering medications ) are ok

## 2020-02-10 NOTE — Progress Notes (Signed)
Name: Angel [name]  MRN/ DOB: [id number], 1955/12/08   Age/ Sex: 64 y.o., male    PCP: Elise Benne   Reason for Endocrinology Evaluation: Type 2 Diabetes Mellitus     Date of Initial Endocrinology Visit: 02/10/2020     PATIENT IDENTIFIER: Angel [name] is a 64 y.o. male with a past medical history of Hep B carrier, T2DM, Bipolar disorder and Cirrhosis. The patient presented for initial endocrinology clinic visit on 02/10/2020 for consultative assistance with his diabetes management.    HPI: Angel [name] is accompanied by  Angel [name]    Diagnosed with DM in 02/2020 Prior Medications tried/Intolerance: N/A  Currently checking blood sugars occasionally , fasting  Hypoglycemia episodes : no       Hemoglobin A1c has ranged from 6.7% in 02/2020, peaking at 7.1 %  in 12/2019. Patient required assistance for hypoglycemia: no Patient has required hospitalization within the last 1 year from hyper or hypoglycemia: no  In terms of diet, the patient  Avoids sugar- sweetened beverages. 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 Bipolar disorder ([place])   . 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 Depression   . GERD (gastroesophageal reflux disease)   . Headache   . Hepatitis B    Hepatitis E. antigen positive by history, status post treatment with Hepsera and baraclude   . Hepatitis B carrier ([place])   . Personal history of colonic polyps     Adenomas polyp 2008 and 2010, 2015   . Weight loss    Pt. has lost 10 lbs since pre-visit, intentionally with diet and walking   Past Surgical History:  Past Surgical History:  Procedure Laterality Date  . APPENDECTOMY    . COLONOSCOPY    . POLYPECTOMY    . UPPER GASTROINTESTINAL ENDOSCOPY        Social History:  reports that he quit smoking about 10 years ago. His smoking use included cigarettes. He has never used smokeless tobacco. He reports that he does not drink alcohol and does not use drugs. Family History:  Family History  Problem Relation Age of Onset  . Heart disease Mother   . Diabetes Father   . Kidney failure Father   . Kidney disease Father   . Other Sister        GERD  . Colon cancer Neg Hx   . Rectal cancer Neg Hx   . Stomach cancer Neg Hx   . Esophageal cancer Neg Hx   . Headache Neg Hx   . Migraines Neg Hx      HOME MEDICATIONS: Allergies as of 02/10/2020      Reactions   Aripiprazole Other (See Comments)   Tardive Dyskinesia   Invega [paliperidone Er] Other (See Comments)   Tardive dyskinsia symptoms   Quetiapine Diarrhea, Hives, Itching, Nausea And Vomiting, Nausea Only, Shortness Of Breath   Abilify [aripiprazole]    Lamictal [lamotrigine] Rash  Lexapro [escitalopram Oxalate] Other (See Comments)   Doesn't Work      Medication List       Accurate as of February 10, 2020 10:27 AM. If you have any questions, ask your nurse or doctor.        STOP taking these medications   methylPREDNISolone 4 MG Tbpk tablet Commonly known as: MEDROL DOSEPAK Stopped by: Dorita Sciara, MD     TAKE these medications   Accu-Chek Aviva Plus test strip Generic drug: glucose blood Check sugar TID . Dx code:R73.9   Accu-Chek Aviva Plus w/Device Kit Check sugar TID . Dx code:R73.9   Accu-Chek Softclix Lancets lancets Check sugar TID . Dx code:R73.9   ALPRAZolam 0.25 MG tablet Commonly known as: XANAX Take 0.25 mg by mouth at bedtime.   CREON  PO Take by mouth. 2400 units 8 times a day   dicyclomine 10 MG capsule Commonly known as: Bentyl Take 1 capsule (10 mg total) by mouth 3 (three) times daily before meals.   divalproex 250 MG 24 hr tablet Commonly known as: Depakote ER 1 bid   famciclovir 500 MG tablet Commonly known as: FAMVIR Take 1 tablet (500 mg total) by mouth 3 (three) times daily.   gabapentin 100 MG capsule Commonly known as: NEURONTIN Take 1 capsule (100 mg total) by mouth at bedtime.   lithium 600 MG capsule TAKE 1 CAPSULE BY MOUTH EVERY MORNING AND 2 CAPSULES EVERY NIGHT AT BEDTIME   Melatonin 10 MG Subl Place 10 mg under the tongue at bedtime.   methocarbamol 750 MG tablet Commonly known as: ROBAXIN Take 1 tablet (750 mg total) by mouth every 8 (eight) hours as needed for muscle spasms.   nadolol 40 MG tablet Commonly known as: CORGARD TAKE 1 TABLET BY MOUTH EVERY DAY   tizanidine 2 MG capsule Commonly known as: ZANAFLEX Take 1-2 capsules (2-4 mg total) by mouth 3 (three) times daily as needed for muscle spasms.   Vemlidy 25 MG Tabs Generic drug: Tenofovir Alafenamide Fumarate Take by mouth.        ALLERGIES: Allergies  Allergen Reactions  . Aripiprazole Other (See Comments)    Tardive Dyskinesia  . Invega [Paliperidone Er] Other (See Comments)    Tardive dyskinsia symptoms  . Quetiapine Diarrhea, Hives, Itching, Nausea And Vomiting, Nausea Only and Shortness Of Breath  . Abilify [Aripiprazole]   . Lamictal [Lamotrigine] Rash  . Lexapro [Escitalopram Oxalate] Other (See Comments)    Doesn't Work     REVIEW OF SYSTEMS: A comprehensive ROS was conducted with the patient and is negative except as per HPI    OBJECTIVE:   VITAL SIGNS: BP 134/80   Pulse (!) 54   Ht $R'5\' 10"'wt$  (1.778 m)   Wt 247 lb (112 kg)   SpO2 96%   BMI 35.44 kg/m    PHYSICAL EXAM:  General: Pt appears well and is in NAD  Neck: General: Supple without adenopathy or carotid bruits. Thyroid: Thyroid size  normal.  No goiter or nodules appreciated. No thyroid bruit.  Lungs: Clear with good BS bilat with no rales, rhonchi, or wheezes  Heart: RRR with normal S1 and S2 and no gallops; no murmurs; no rub  Abdomen: Normoactive bowel sounds, soft, nontender, without masses or organomegaly palpable  Extremities:  Lower extremities - No pretibial edema. No lesions.  Skin: Normal texture and temperature to palpation.   Neuro: MS is good with appropriate affect, pt is alert and Ox3    DM foot  exam: 02/10/2020  The skin of the feet is without sores or ulcerations. The pedal pulses are 1+ on right and 1+ on left. The sensation is decreased  to a screening 5.07, 10 gram monofilament bilaterally   DATA REVIEWED:  Lab Results  Component Value Date   HGBA1C 6.7 (A) 02/10/2020   HGBA1C 6.3 02/28/2019   HGBA1C 5.8 03/14/2018   Lab Results  Component Value Date   LDLCALC 45 09/28/2016   CREATININE 0.81 11/28/2019   No results found for: [facility]  Lab Results  Component Value Date   CHOL 106 11/09/2016   HDL 29 (L) 11/09/2016   LDLCALC 45 09/28/2016   TRIG 79 11/09/2016   CHOLHDL 3.7 11/09/2016       Results for ZACHARI, ALBERTA (MRN [id number]) as of 02/11/2020 15:39  Ref. Range 02/10/2020 10:54  Total CHOL/HDL Ratio Latest Ref Range: <5.0 (calc) 5.4 (H)  Cholesterol Latest Ref Range: <200 mg/dL 830  HDL Cholesterol Latest Ref Range: > OR = 40 mg/dL 29 (L)  LDL Cholesterol (Calc) Latest Units: mg/dL (calc) 93  MICROALB/CREAT RATIO Latest Ref Range: <30 mcg/mg creat 8  Non-HDL Cholesterol (Calc) Latest Ref Range: <130 mg/dL (calc) 528  Triglycerides Latest Ref Range: <150 mg/dL 188 (H)  Microalb, Ur Latest Units: mg/dL 0.4  Creatinine, Urine Latest Ref Range: 20 - 320 mg/dL 51   ASSESSMENT / PLAN / RECOMMENDATIONS:   1) Type 2 Diabetes Mellitus, newly diagnosed , With neuropathic complications - Most recent A1c of 6.7%. Goal A1c < 7.0 %.   Plan: GENERAL: I have discussed with the  patient the pathophysiology of diabetes. We went over the natural progression of the disease. We talked about both insulin resistance and insulin deficiency. We stressed the importance of lifestyle changes including diet and exercise. I explained the complications associated with diabetes including retinopathy, nephropathy, neuropathy as well as increased risk of cardiovascular disease. We went over the benefit seen with glycemic control.    I explained to the patient that diabetic patients are at higher than normal risk for amputations.   Patient tells me he had an A1c of approximately 7.1%, I cannot see a record of this in his chart.  But his A1c today 6.7%, I have praised the patient on dietary modifications, he has an upcoming appointment with a registered dietitian.  I will not be starting him on any medication at this time, he is concerned about his excessive diarrhea.  MEDICATIONS:  N/A  EDUCATION / INSTRUCTIONS:  BG monitoring instructions: Patient is instructed to check his blood sugars 2 times a day, fasting and bedtime.  Call [facility] if: BG persistently < 70  . I reviewed the Rule of 15 for the treatment of hypoglycemia in detail with the patient. Literature supplied.   2) Diabetic complications:   Eye: Does not have known diabetic retinopathy.   Neuro/ Feet: Does have known diabetic peripheral neuropathy.  Renal: Patient does not have known baseline CKD. He is not on an ACEI/ARB at present.Check urine albumin/creatinine ratio yearly starting at time of diagnosis.  3) Dyslipidemia: Patient is not on a statin.  Given his history of cirrhosis, I have asked him to get clearance from his hepatologist  next month. The first line of lipid lowering agents in patient with diabetes is statins  In the meantime he will be advised to start OTC fish oil    Fish oil 1200 mg BID      Signed electronically by: Lyndle Herrlich, MD  [facility]  Endocrinology  [facility] [address] [place], [place] [address] Phone: [phone] FAX: [phone]   CC: Elise Benne [address] [place] [address] Phone: [phone]  Fax: [phone]    Return to Endocrinology clinic as below: Future Appointments  Date Time Provider [place]  02/16/2020  9:30 AM Magnus Sinning, MD [facility] None  03/03/2020 10:00 AM Jessy Oto, MD [facility] None  03/05/2020 11:00 AM Short, Max Sane, RD [facility] NDM

## 2020-02-11 ENCOUNTER — Encounter: Payer: Self-pay | Admitting: Internal Medicine

## 2020-02-11 DIAGNOSIS — E1142 Type 2 diabetes mellitus with diabetic polyneuropathy: Secondary | ICD-10-CM | POA: Insufficient documentation

## 2020-02-11 DIAGNOSIS — E119 Type 2 diabetes mellitus without complications: Secondary | ICD-10-CM | POA: Insufficient documentation

## 2020-02-11 DIAGNOSIS — E781 Pure hyperglyceridemia: Secondary | ICD-10-CM | POA: Insufficient documentation

## 2020-02-11 DIAGNOSIS — E1165 Type 2 diabetes mellitus with hyperglycemia: Secondary | ICD-10-CM | POA: Insufficient documentation

## 2020-02-11 LAB — MICROALBUMIN / CREATININE URINE RATIO
Creatinine, Urine: 51 mg/dL (ref 20–320)
Microalb Creat Ratio: 8 mcg/mg creat (ref <30)
Microalb, Ur: 0.4 mg/dL

## 2020-02-11 LAB — LIPID PANEL
Cholesterol: 156 mg/dL (ref <200)
HDL: 29 mg/dL — ABNORMAL LOW (ref >=40)
LDL Cholesterol (Calc): 93 mg/dL (calc)
Non-HDL Cholesterol (Calc): 127 mg/dL (calc) (ref <130)
Total CHOL/HDL Ratio: 5.4 (calc) — ABNORMAL HIGH (ref <5.0)
Triglycerides: 258 mg/dL — ABNORMAL HIGH (ref <150)

## 2020-02-13 DIAGNOSIS — K7469 Other cirrhosis of liver: Principal | ICD-10-CM

## 2020-02-13 DIAGNOSIS — B181 Chronic viral hepatitis B without delta-agent: Principal | ICD-10-CM

## 2020-02-13 MED ORDER — VEMLIDY 25 MG TABLET
ORAL_TABLET | Freq: Every day | ORAL | 3 refills | 90.00000 days | Status: CP
Start: 2020-02-13 — End: 2021-02-07

## 2020-02-16 ENCOUNTER — Ambulatory Visit (INDEPENDENT_AMBULATORY_CARE_PROVIDER_SITE_OTHER): Payer: BC Managed Care – PPO | Admitting: Physical Medicine and Rehabilitation

## 2020-02-16 ENCOUNTER — Other Ambulatory Visit: Payer: Self-pay

## 2020-02-16 ENCOUNTER — Ambulatory Visit: Payer: Self-pay

## 2020-02-16 ENCOUNTER — Encounter: Payer: Self-pay | Admitting: Physical Medicine and Rehabilitation

## 2020-02-16 VITALS — BP 127/81 | HR 56

## 2020-02-16 DIAGNOSIS — M5010 Cervical disc disorder with radiculopathy, unspecified cervical region: Secondary | ICD-10-CM

## 2020-02-16 DIAGNOSIS — R197 Diarrhea, unspecified: Secondary | ICD-10-CM | POA: Diagnosis not present

## 2020-02-16 DIAGNOSIS — R110 Nausea: Secondary | ICD-10-CM | POA: Diagnosis not present

## 2020-02-16 MED ORDER — METHYLPREDNISOLONE ACETATE 80 MG/ML IJ SUSP
80.0000 mg | Freq: Once | INTRAMUSCULAR | Status: AC
  Administered 2020-02-16: 80 mg

## 2020-02-16 NOTE — Progress Notes (Signed)
Angel [name] - 64 y.o. male MRN [id number]  Date of birth: [birth year]  Office Visit Note: Visit Date: 02/16/2020 PCP: [name], PA-C Referred by: [name], PA-C  Subjective: Chief Complaint  Patient presents with  . Neck - Pain  . Right Shoulder - Pain  . Right Arm - Pain   HPI:  Angel [name] is a 64 y.o. male who comes in today at the request of Dr. Basil Dess for planned Right C7-T1 Cervical epidural steroid injection with fluoroscopic guidance.  The patient has failed conservative care including home exercise, medications, time and activity modification.  This injection will be diagnostic and hopefully therapeutic.  Please see requesting physician notes for further details and justification.  MRI reviewed with images and spine model.  MRI reviewed in the note below.   ROS Otherwise per HPI.  Assessment & Plan: Visit Diagnoses:  1. Cervical disc disorder with radiculopathy     Plan: No additional findings.   Meds & Orders:  Meds ordered this encounter  Medications  . methylPREDNISolone acetate (DEPO-MEDROL) injection 80 mg    Orders Placed This Encounter  Procedures  . XR C-ARM NO REPORT  . Epidural Steroid injection    Follow-up: Return if symptoms worsen or fail to improve.   Procedures: No procedures performed  Cervical Epidural Steroid Injection - Interlaminar Approach with Fluoroscopic Guidance  Patient: Angel [name]      Date of Birth: Jun 14, 1955 MRN: [id number] PCP: [name], PA-C      Visit Date: 02/16/2020   Universal Protocol:    Date/Time: 02/15/2110:29 AM  Consent Given By: the patient  Position: PRONE  Additional Comments: Vital signs were monitored before and after the procedure. Patient was prepped and draped in the usual sterile fashion. The correct patient, procedure, and site was verified.   Injection Procedure Details:   Procedure diagnoses:  1. Cervical disc disorder with radiculopathy      Meds  Administered:  Meds ordered this encounter  Medications  . methylPREDNISolone acetate (DEPO-MEDROL) injection 80 mg     Laterality: Right  Location/Site: C7-T1  Needle size: 20 G  Needle type: Touhy  Needle Placement: Paramedian epidural space  Findings:  -Comments: Excellent flow of contrast into the epidural space.  Procedure Details: Using a paramedian approach from the side mentioned above, the region overlying the inferior lamina was localized under fluoroscopic visualization and the soft tissues overlying this structure were infiltrated with 4 ml. of 1% Lidocaine without Epinephrine. A # 20 gauge, Tuohy needle was inserted into the epidural space using a paramedian approach.  The epidural space was localized using loss of resistance along with contralateral oblique bi-planar fluoroscopic views.  After negative aspirate for air, blood, and CSF, a 2 ml. volume of Isovue-250 was injected into the epidural space and the flow of contrast was observed. Radiographs were obtained for documentation purposes.   The injectate was administered into the level noted above.  Additional Comments:  The patient tolerated the procedure well Dressing: 2 x 2 sterile gauze and Band-Aid    Post-procedure details: Patient was observed during the procedure. Post-procedure instructions were reviewed.  Patient left the clinic in stable condition.     Clinical History: MRI CERVICAL SPINE WITHOUT CONTRAST  TECHNIQUE: Multiplanar, multisequence MR imaging of the cervical spine was performed. No intravenous contrast was administered.  COMPARISON:  None available.  FINDINGS: Alignment: Straightening of the normal cervical lordosis. No listhesis.  Vertebrae: Vertebral body heights are maintained without evidence for acute or  chronic fracture. Bone marrow signal intensity within normal limits. Benign hemangioma noted within the T1 vertebral body. Prominent reactive endplate changes  present about the C5-6 interspace. No worrisome osseous lesions.  Cord: Signal intensity within the cervical spinal cord is normal.  Posterior Fossa, vertebral arteries, paraspinal tissues: Visualized brain and posterior fossa within normal limits. Craniocervical junction normal. Paraspinous and prevertebral soft tissues normal. Normal intravascular flow voids present within the vertebral arteries bilaterally.  Disc levels:  C2-C3: Mild uncovertebral hypertrophy. Right C2-3 facets appear fused. No significant stenosis.  C3-C4: Left eccentric disc bulge with left greater than right uncovertebral hypertrophy. Right greater than left facet hypertrophy. No significant spinal stenosis. Mild bilateral C4 foraminal narrowing.  C4-C5: Diffuse disc bulge with bilateral uncovertebral hypertrophy. Posterior disc osteophyte flattens the ventral thecal sac results in mild spinal stenosis. Moderate left with mild right C5 foraminal narrowing.  C5-C6: Chronic circumferential disc osteophyte complex with intervertebral disc space narrowing. Broad posterior component flattens the ventral thecal sac with mild spinal stenosis. Moderate to severe right with moderate left C6 foraminal narrowing.  C6-C7: Mild disc bulge with uncovertebral hypertrophy. No significant stenosis.  C7-T1:  Minimal facet hypertrophy on the left.  No stenosis.  Visualized upper thoracic spine within normal limits.  IMPRESSION: 1. Degenerative disc osteophyte at C4-5 and C5-6 with resultant mild spinal stenosis. 2. Multifactorial degenerative changes with resultant multilevel foraminal narrowing as above. Notable findings include moderate left C5 foraminal stenosis, with moderate to severe right and moderate left C6 foraminal narrowing.   Electronically Signed   By: Jeannine Boga M.D.   On: 10/28/2017 06:12     Objective:  VS:  HT:    WT:   BMI:     BP:127/81  HR:(!) 56bpm  TEMP: ( )   RESP:  Physical Exam Vitals and nursing note reviewed.  Constitutional:      General: He is not in acute distress.    Appearance: Normal appearance. He is not ill-appearing.  HENT:     Head: Normocephalic and atraumatic.     Right Ear: External ear normal.     Left Ear: External ear normal.  Eyes:     Extraocular Movements: Extraocular movements intact.  Cardiovascular:     Rate and Rhythm: Normal rate.     Pulses: Normal pulses.  Abdominal:     General: There is no distension.     Palpations: Abdomen is soft.  Musculoskeletal:        General: No signs of injury.     Cervical back: Neck supple. Tenderness present. No rigidity.     Right lower leg: No edema.     Left lower leg: No edema.     Comments: Patient has good strength in the upper extremities with 5 out of 5 strength in wrist extension long finger flexion APB.  No intrinsic hand muscle atrophy.  Negative Hoffmann's test.  Lymphadenopathy:     Cervical: No cervical adenopathy.  Skin:    Findings: No erythema or rash.  Neurological:     General: No focal deficit present.     Mental Status: He is alert and oriented to person, place, and time.     Sensory: No sensory deficit.     Motor: No weakness or abnormal muscle tone.     Coordination: Coordination normal.  Psychiatric:        Mood and Affect: Mood normal.        Behavior: Behavior normal.      Imaging: XR C-ARM  NO REPORT  Result Date: 02/16/2020 Please see Notes tab for imaging impression.

## 2020-02-16 NOTE — Progress Notes (Signed)
Pt state neck pain the travels down his right shoulder and arm. Pt state while driving it hurts when he has to turn his neck and lifting a glass off his night stand makes the pain worse. Pt state he use the wall up the wall excise to ease the pain. Pt has hx of inj on 12/08/19 Pt state it worked but not for a long time.  Numeric Pain Rating Scale and Functional Assessment Average Pain 4   In the last MONTH (on 0-10 scale) has pain interfered with the following?  1. General activity like being  able to carry out your everyday physical activities such as walking, climbing stairs, carrying groceries, or moving a chair?  Rating(9)   +Driver, -BT, -Dye Allergies.

## 2020-02-16 NOTE — Procedures (Signed)
Cervical Epidural Steroid Injection - Interlaminar Approach with Fluoroscopic Guidance  Patient: Angel [name]      Date of Birth: 01-08-1956 MRN: [id number] PCP: [name], PA-C      Visit Date: 02/16/2020   Universal Protocol:    Date/Time: 02/15/2110:29 AM  Consent Given By: the patient  Position: PRONE  Additional Comments: Vital signs were monitored before and after the procedure. Patient was prepped and draped in the usual sterile fashion. The correct patient, procedure, and site was verified.   Injection Procedure Details:   Procedure diagnoses:  1. Cervical disc disorder with radiculopathy      Meds Administered:  Meds ordered this encounter  Medications  . methylPREDNISolone acetate (DEPO-MEDROL) injection 80 mg     Laterality: Right  Location/Site: C7-T1  Needle size: 20 G  Needle type: Touhy  Needle Placement: Paramedian epidural space  Findings:  -Comments: Excellent flow of contrast into the epidural space.  Procedure Details: Using a paramedian approach from the side mentioned above, the region overlying the inferior lamina was localized under fluoroscopic visualization and the soft tissues overlying this structure were infiltrated with 4 ml. of 1% Lidocaine without Epinephrine. A # 20 gauge, Tuohy needle was inserted into the epidural space using a paramedian approach.  The epidural space was localized using loss of resistance along with contralateral oblique bi-planar fluoroscopic views.  After negative aspirate for air, blood, and CSF, a 2 ml. volume of Isovue-250 was injected into the epidural space and the flow of contrast was observed. Radiographs were obtained for documentation purposes.   The injectate was administered into the level noted above.  Additional Comments:  The patient tolerated the procedure well Dressing: 2 x 2 sterile gauze and Band-Aid    Post-procedure details: Patient was observed during the  procedure. Post-procedure instructions were reviewed.  Patient left the clinic in stable condition.

## 2020-02-17 ENCOUNTER — Encounter: Payer: Self-pay | Admitting: Medical

## 2020-02-23 ENCOUNTER — Encounter: Payer: Self-pay | Admitting: Physical Medicine and Rehabilitation

## 2020-02-23 DIAGNOSIS — G2401 Drug induced subacute dyskinesia: Secondary | ICD-10-CM | POA: Diagnosis not present

## 2020-02-23 DIAGNOSIS — F319 Bipolar disorder, unspecified: Secondary | ICD-10-CM | POA: Diagnosis not present

## 2020-02-23 DIAGNOSIS — G4700 Insomnia, unspecified: Secondary | ICD-10-CM | POA: Diagnosis not present

## 2020-02-23 DIAGNOSIS — K8681 Exocrine pancreatic insufficiency: Secondary | ICD-10-CM | POA: Diagnosis not present

## 2020-02-26 ENCOUNTER — Ambulatory Visit (INDEPENDENT_AMBULATORY_CARE_PROVIDER_SITE_OTHER): Payer: BC Managed Care – PPO

## 2020-02-26 ENCOUNTER — Other Ambulatory Visit: Payer: Self-pay

## 2020-02-26 DIAGNOSIS — Z23 Encounter for immunization: Secondary | ICD-10-CM | POA: Diagnosis not present

## 2020-03-01 ENCOUNTER — Encounter: Payer: Self-pay | Admitting: Specialist

## 2020-03-02 ENCOUNTER — Encounter: Payer: Self-pay | Admitting: Medical

## 2020-03-03 ENCOUNTER — Other Ambulatory Visit: Payer: Self-pay

## 2020-03-03 ENCOUNTER — Encounter: Payer: Self-pay | Admitting: Medical

## 2020-03-03 ENCOUNTER — Ambulatory Visit: Payer: BC Managed Care – PPO | Admitting: Specialist

## 2020-03-04 ENCOUNTER — Ambulatory Visit: Payer: BC Managed Care – PPO | Admitting: Surgery

## 2020-03-04 ENCOUNTER — Encounter: Payer: Self-pay | Admitting: Medical

## 2020-03-04 ENCOUNTER — Telehealth: Payer: Self-pay | Admitting: Medical

## 2020-03-04 ENCOUNTER — Telehealth (INDEPENDENT_AMBULATORY_CARE_PROVIDER_SITE_OTHER): Payer: BC Managed Care – PPO | Admitting: Medical

## 2020-03-04 DIAGNOSIS — K047 Periapical abscess without sinus: Secondary | ICD-10-CM | POA: Diagnosis not present

## 2020-03-04 DIAGNOSIS — R059 Cough, unspecified: Secondary | ICD-10-CM

## 2020-03-04 DIAGNOSIS — R0989 Other specified symptoms and signs involving the circulatory and respiratory systems: Secondary | ICD-10-CM | POA: Diagnosis not present

## 2020-03-04 MED ORDER — FLUTICASONE PROPIONATE 50 MCG/ACT NA SUSP: 2.0000 | Freq: Every day | NASAL | 1 refills | Status: DC

## 2020-03-04 MED ORDER — AMOXICILLIN-POT CLAVULANATE 875-125 MG PO TABS: 1.0000 | ORAL_TABLET | Freq: Two times a day (BID) | ORAL | 0 refills | Status: DC

## 2020-03-04 MED ORDER — HYDROCODONE-HOMATROPINE 5-1.5 MG/5ML PO SYRP: 5.0000 mL | ORAL_SOLUTION | Freq: Four times a day (QID) | ORAL | 0 refills | Status: DC | PRN

## 2020-03-04 NOTE — Telephone Encounter (Signed)
Patient call in reference to status of prescription.

## 2020-03-04 NOTE — Patient Instructions (Addendum)
For recent severe cough, will prescribe hycodan cough syrup. Also recommend flonase nasal spray.   If cough persists, if productive cough or chest congestion then notify us.  Also would recommend chest xray if signs/symptoms persist.  Also recent tooth abscesses. Will go ahead and augmentin.  Recommend against getting covid booster presently. Wait until you are better from the above.  Follow up 7-10 days or as needed

## 2020-03-04 NOTE — Telephone Encounter (Signed)
Patient states fluticasone (FLONASE) 50 MCG/ACT nasal spray [[id number]]  Is cheaper at [employer].  Can you please send rx to [facility]?  [facility] [place], [address] Phone:  [phone]  Fax:  [phone]

## 2020-03-04 NOTE — Progress Notes (Addendum)
° °  Subjective:    Patient ID: Angel [name], male    DOB: 10-27-1955, 64 y.o.   MRN: [id number]  HPI  Virtual Visit via Video Note  I connected with Angel [name] on 03/04/20 at 10:20 AM EDT by a video enabled telemedicine application and verified that I am speaking with the correct person using two identifiers.  Pt vitals signs not taken today.  Location: Patient: home Provider: office  Participants- Pt and myself.   I discussed the limitations of evaluation and management by telemedicine and the availability of in person appointments. The patient expressed understanding and agreed to proceed.  History of Present Illness: Pt states he developed dry hacking persistent cough. He states cough is so severe he can't sleep. He was coughing so much yesterday that he dry heaved/vomited up some mucus.  Pt felt some initial chills on Sunday and then recurrent on Wednesday.  Pt does not have any body aches.   Pt has no sneezing or obvious pnd.   Does have mild runny nose.   Pt is sheduled covid booster this afternoon. He got moderna series in April.   Pt was trying to get tested for covid. Delay in insurance sending out test. He can get rapid test otc.   Observations/Objective: General-no acute distress, pleasant, oriented.(intermittent dry cough) Lungs- on inspection lungs appear unlabored. Neck- no tracheal deviation or jvd on inspection. Neuro- gross motor function appears intact.  Assessment and Plan: For recent severe cough, will prescribe hycodan cough syrup. Also recommend flonase nasal spray.   If cough persists, if productive cough or chest congestion then notify us.  Also would recommend chest xray if signs/symptoms persist.  Also recent tooth abscesses. Will go ahead and augmentin.  Recommend against getting covid booster presently. Wait until you are better from the above.  Follow up 7-10 days or as needed  [name], PA-C   Time spent with patient  today was 30  minutes which consisted of chart review, discussing diagnosis, work up, treatment and documentation.  Follow Up Instructions:    I discussed the assessment and treatment plan with the patient. The patient was provided an opportunity to ask questions and all were answered. The patient agreed with the plan and demonstrated an understanding of the instructions.   The patient was advised to call back or seek an in-person evaluation if the symptoms worsen or if the condition fails to improve as anticipated.     [name], PA-C    Review of Systems  Constitutional: Negative for chills, diaphoresis and fever.  HENT: Positive for rhinorrhea. Negative for congestion and postnasal drip.   Respiratory: Positive for cough. Negative for chest tightness, shortness of breath and wheezing.   Cardiovascular: Negative for chest pain and palpitations.  Gastrointestinal: Negative for abdominal pain, nausea and vomiting.  Musculoskeletal: Negative for back pain.  Skin: Negative for rash.  Neurological: Negative for dizziness, syncope, weakness, numbness and headaches.  Hematological: Negative for adenopathy. Does not bruise/bleed easily.  Psychiatric/Behavioral: Negative for behavioral problems, decreased concentration and dysphoric mood.       Objective:   Physical Exam        Assessment & Plan:

## 2020-03-05 ENCOUNTER — Ambulatory Visit: Payer: BC Managed Care – PPO | Admitting: Dietician

## 2020-03-05 ENCOUNTER — Ambulatory Visit: Payer: BC Managed Care – PPO | Admitting: Medical

## 2020-03-05 ENCOUNTER — Encounter: Payer: Self-pay | Admitting: Medical

## 2020-03-05 ENCOUNTER — Telehealth: Payer: Self-pay | Admitting: Medical

## 2020-03-05 MED ORDER — BENZONATATE 100 MG PO CAPS: 100.0000 mg | ORAL_CAPSULE | Freq: Three times a day (TID) | ORAL | 0 refills | Status: DC | PRN

## 2020-03-05 NOTE — Telephone Encounter (Signed)
Allergy updated.

## 2020-03-05 NOTE — Telephone Encounter (Signed)
Will you put down hycodan/hydrocodone allergy for pt.

## 2020-03-05 NOTE — Telephone Encounter (Signed)
Patient is communicating with Angel [name] via [employer]

## 2020-03-06 ENCOUNTER — Encounter: Payer: Self-pay | Admitting: Medical

## 2020-03-07 ENCOUNTER — Encounter: Payer: Self-pay | Admitting: Medical

## 2020-03-08 MED ORDER — FLUTICASONE PROPIONATE 50 MCG/ACT NA SUSP
2.0000 | Freq: Every day | NASAL | 1 refills | Status: DC
Start: 2020-03-08 — End: 2020-09-07

## 2020-03-09 ENCOUNTER — Ambulatory Visit (INDEPENDENT_AMBULATORY_CARE_PROVIDER_SITE_OTHER): Payer: BC Managed Care – PPO | Admitting: Medical

## 2020-03-09 ENCOUNTER — Ambulatory Visit (HOSPITAL_BASED_OUTPATIENT_CLINIC_OR_DEPARTMENT_OTHER)
Admission: RE | Admit: 2020-03-09 | Discharge: 2020-03-09 | Disposition: A | Discharge status: Home / Self Care | Payer: BC Managed Care – PPO | Source: Ambulatory Visit | Attending: Medical | Admitting: Medical

## 2020-03-09 ENCOUNTER — Other Ambulatory Visit: Payer: Self-pay

## 2020-03-09 ENCOUNTER — Telehealth: Payer: Self-pay

## 2020-03-09 ENCOUNTER — Encounter: Payer: Self-pay | Admitting: Medical

## 2020-03-09 ENCOUNTER — Telehealth: Payer: Self-pay | Admitting: Medical

## 2020-03-09 VITALS — BP 144/91 | HR 90 | Temp 98.4°F | Resp 18 | Ht 70.0 in | Wt 242.0 lb

## 2020-03-09 DIAGNOSIS — I8510 Secondary esophageal varices without bleeding: Principal | ICD-10-CM

## 2020-03-09 DIAGNOSIS — B181 Chronic viral hepatitis B without delta-agent: Principal | ICD-10-CM

## 2020-03-09 DIAGNOSIS — K766 Portal hypertension: Principal | ICD-10-CM

## 2020-03-09 DIAGNOSIS — K7469 Other cirrhosis of liver: Principal | ICD-10-CM

## 2020-03-09 DIAGNOSIS — K047 Periapical abscess without sinus: Secondary | ICD-10-CM

## 2020-03-09 DIAGNOSIS — R0982 Postnasal drip: Secondary | ICD-10-CM

## 2020-03-09 DIAGNOSIS — F419 Anxiety disorder, unspecified: Secondary | ICD-10-CM | POA: Diagnosis not present

## 2020-03-09 DIAGNOSIS — R059 Cough, unspecified: Secondary | ICD-10-CM

## 2020-03-09 DIAGNOSIS — R918 Other nonspecific abnormal finding of lung field: Secondary | ICD-10-CM

## 2020-03-09 DIAGNOSIS — J948 Other specified pleural conditions: Secondary | ICD-10-CM

## 2020-03-09 MED ORDER — NADOLOL 40 MG TABLET
ORAL_TABLET | Freq: Every day | ORAL | 4 refills | 90.00000 days | Status: CP
Start: 2020-03-09 — End: 2020-03-23

## 2020-03-09 MED ORDER — LORATADINE 10 MG PO TABS: 10.0000 mg | ORAL_TABLET | Freq: Every day | ORAL | 0 refills | Status: DC

## 2020-03-09 NOTE — Telephone Encounter (Signed)
[facility] radiology called stating patient's chest xray shows possible mass like density .. and CT with contrast is recommended .

## 2020-03-09 NOTE — Telephone Encounter (Signed)
Ct of chest with contrast placed. Can we get this prior authorized and get test done by end of the week.

## 2020-03-09 NOTE — Progress Notes (Signed)
Subjective:    Patient ID: Angel [name], male    DOB: 05-30-1955, 64 y.o.   MRN: [id number]  HPI    Pt in for follow up.  Pt states his cough has improved a lot. Pt states it helps him sleep. Benzonatate makes him excessively drowsy.  Pt covid test came back negative.   History of chronic diarrhea. GI gave him lomotil.   Rt side lower teeth pain intermittently. Rt submandibular node was swollen yesterday. Saw dentist and told abscess but did not give antibiotic. Since then he got lymph node swelling. Pt sent me message about tooth. So I prescribed augmentin.    Review of Systems  Constitutional: Negative for chills, fatigue and fever.  HENT: Positive for postnasal drip. Negative for congestion, ear pain, sinus pressure and sinus pain.   Respiratory: Negative for cough, chest tightness, shortness of breath and wheezing.   Cardiovascular: Negative for chest pain and palpitations.  Gastrointestinal: Negative for abdominal pain and blood in stool.  Musculoskeletal: Negative for back pain, gait problem and joint swelling.  Skin: Negative for rash.  Neurological: Negative for dizziness, speech difficulty, weakness, numbness and headaches.  Hematological: Negative for adenopathy. Does not bruise/bleed easily.  Psychiatric/Behavioral: Negative for behavioral problems, confusion, hallucinations and suicidal ideas. The patient is not nervous/anxious.     Past Medical History:  Diagnosis Date  . Bipolar disorder ([place])   . 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 Depression   . GERD (gastroesophageal reflux disease)   . Headache   . Hepatitis B    Hepatitis E. antigen positive by history, status post treatment with Hepsera and baraclude   . 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 Personal history of colonic polyps    Adenomas polyp 2008 and 2010, 2015   . Weight loss    Pt. has lost 10 lbs since pre-visit, intentionally with diet and walking     Social History   Socioeconomic  History  . Marital status: Married    Spouse name: [name]  . Number of children: 0  . Years of education: Not on file  . Highest education level: Master's degree (e.g., MA, MS, MEng, MEd, MSW, MBA)  Occupational History  . Occupation: retired    Fish farm manager: NOT EMPLOYED  Tobacco Use  . Smoking status: Former Smoker    Types: Cigarettes    Quit date: 05/08/2009    Years since quitting: 10.8  . Smokeless tobacco: Never Used  Vaping Use  . Vaping Use: Never used  Substance and Sexual Activity  . Alcohol use: No    Alcohol/week: 0.0 standard drinks  . Drug use: No  . Sexual activity: Not Currently  Other Topics Concern  . Not on file  Social History Narrative   Patient is right-handed. He is married to same sex partner. He gets little exercise.   Caffeine: 4 glasses of tea max/day   Social Determinants of Health   Financial Resource Strain:   . Difficulty of Paying Living Expenses: Not on file  Food Insecurity:   . Worried About Charity fundraiser in the Last Year: Not on file  . Ran Out of Food in the Last Year: Not on file  Transportation Needs:   . Lack of Transportation (Medical): Not on file  . Lack of Transportation (Non-Medical): Not on file  Physical Activity:   . Days of Exercise per Week: Not on file  . Minutes of Exercise per Session: Not on file  Stress:   . Feeling of Stress : Not on file  Social Connections:   . Frequency of Communication with Friends and Family: Not on file  . Frequency of Social Gatherings with Friends and Family: Not on file  . Attends Religious Services: Not on file  . Active Member of Clubs or Organizations: Not on file  . Attends Archivist Meetings: Not on file  . Marital Status: Not on file  Intimate Partner Violence:   . Fear of Current or Ex-Partner: Not on file  . Emotionally Abused: Not on file  . Physically Abused: Not on file  . Sexually Abused: Not on file    Past Surgical History:  Procedure Laterality  Date  . APPENDECTOMY    . COLONOSCOPY    . POLYPECTOMY    . UPPER GASTROINTESTINAL ENDOSCOPY      Family History  Problem Relation Age of Onset  . Heart disease Mother   . Diabetes Father   . Kidney failure Father   . Kidney disease Father   . Other Sister        GERD  . Colon cancer Neg Hx   . Rectal cancer Neg Hx   . Stomach cancer Neg Hx   . Esophageal cancer Neg Hx   . Headache Neg Hx   . Migraines Neg Hx     Allergies  Allergen Reactions  . Aripiprazole Other (See Comments)    Tardive Dyskinesia  . Invega [Paliperidone Er] Other (See Comments)    Tardive dyskinsia symptoms  . Quetiapine Diarrhea, Hives, Itching, Nausea And Vomiting, Nausea Only and Shortness Of Breath  . Abilify [Aripiprazole]   . Hycodan [Hydrocodone-Homatropine]   . Hydrocodone   . Lamictal [Lamotrigine] Rash  . Lexapro [Escitalopram Oxalate] Other (See Comments)    Doesn't Work    Current Outpatient Medications on File Prior to Visit  Medication Sig Dispense Refill  . Accu-Chek Softclix Lancets lancets Check sugar TID . Dx code:R73.9 300 each 1  . ALPRAZolam (XANAX) 0.25 MG tablet Take 0.25 mg by mouth at bedtime.    . Blood Glucose Monitoring Suppl (ACCU-CHEK AVIVA PLUS) w/Device KIT Check sugar TID . Dx code:R73.9 1 kit 0  . diphenoxylate-atropine (LOMOTIL) 2.5-0.025 MG tablet Take by mouth.    . fluticasone (FLONASE) 50 MCG/ACT nasal spray Place 2 sprays into both nostrils daily. 16 g 1  . glucose blood test strip 1 each by Other route 2 (two) times daily. 100 each 12  . Lancet Devices (LANCING DEVICE) MISC 1 Device by Does not apply route as directed. 1 each 1  . lithium 600 MG capsule TAKE 1 CAPSULE BY MOUTH EVERY MORNING AND 2 CAPSULES EVERY NIGHT AT BEDTIME 90 capsule 5  . Melatonin 10 MG SUBL Place 10 mg under the tongue at bedtime.    . nadolol (CORGARD) 40 MG tablet TAKE 1 TABLET BY MOUTH EVERY DAY 30 tablet 0  . Pancrelipase, Lip-Prot-Amyl, (CREON PO) Take by mouth. 2400 units 8  times a day    . Tenofovir Alafenamide Fumarate (VEMLIDY) 25 MG TABS Take by mouth.    . tizanidine (ZANAFLEX) 2 MG capsule Take 1-2 capsules (2-4 mg total) by mouth 3 (three) times daily as needed for muscle spasms. 60 capsule 1  . amoxicillin-clavulanate (AUGMENTIN) 875-125 MG tablet Take 1 tablet by mouth 2 (two) times daily. (Patient not taking: Reported on 03/09/2020) 20 tablet 0  . benzonatate (TESSALON) 100 MG capsule Take 1 capsule (100 mg total) by mouth 3 (  three) times daily as needed for cough. (Patient not taking: Reported on 03/09/2020) 30 capsule 0  . methocarbamol (ROBAXIN) 750 MG tablet Take 1 tablet (750 mg total) by mouth every 8 (eight) hours as needed for muscle spasms. 90 tablet 0  . ondansetron (ZOFRAN) 4 MG tablet Take 4 mg by mouth every 8 (eight) hours as needed. (Patient not taking: Reported on 03/09/2020)     No current facility-administered medications on file prior to visit.    BP (!) 144/91   Pulse 90   Temp 98.4 F (36.9 C) (Oral)   Resp 18   Ht $R'5\' 10"'vl$  (1.778 m)   Wt 242 lb (109.8 kg)   SpO2 96%   BMI 34.72 kg/m       Objective:   Physical Exam   General- No acute distress. Pleasant patient. Neck- Full range of motion, no jvd Lungs- Clear, even and unlabored. Heart- regular rate and rhythm. Neurologic- CNII- XII grossly intact. Mouth- rt side lower teeth poor dentition. Mild rt side submandibular node swollen.      Assessment & Plan:  Recent severe cough with some postnasal drainage.  Cough eventually responded very well to benzonatate.  Though benzonatate causes patient excessive drowsiness during the day.  So pt is ongoing take benzonatate at night and will use Flonase and Claritin.  Hopefully this will stop postnasal drainage and help with cough.  Did get chest x-ray today due to describes severe cough.  Radiologist report mention possible pleural mass and recommended getting CTA chest with contrast.  That order was placed and I did ask my staff  to get prior Auth for that study.  Notify patient via MyChart and try to call directly as well regarding need for CT imaging.  History of anxiety.  Recommend getting back on the Xanax.  This was held briefly while he tried hydrocodone-based cough syrup.  Tooth infection right lower side.  Continue with Augmentin and get second opinion from other dentist since not satisfied with first recommendation.  Follow-up in 10 days or as needed.

## 2020-03-09 NOTE — Patient Instructions (Signed)
Recent severe cough with some postnasal drainage.  Cough eventually responded very well to benzonatate.  Though benzonatate causes patient excessive drowsiness during the day.  So pt is ongoing take benzonatate at night and will use Flonase and Claritin.  Hopefully this will stop postnasal drainage and help with cough.  Did get chest x-ray today due to describes severe cough.  Radiologist report mention possible pleural mass and recommended getting CTA chest with contrast.  That order was placed and I did ask my staff to get prior Auth for that study.  Notify patient via MyChart and try to call directly as well regarding need for CT imaging.  History of anxiety.  Recommend getting back on the Xanax.  This was held briefly while he tried hydrocodone-based cough syrup.  Tooth infection right lower side.  Continue with Augmentin and get second opinion from other dentist since not satisfied with first recommendation.  Follow-up in 10 days or as needed.

## 2020-03-10 ENCOUNTER — Other Ambulatory Visit: Payer: Self-pay | Admitting: *Deleted

## 2020-03-10 ENCOUNTER — Other Ambulatory Visit: Payer: Self-pay

## 2020-03-10 ENCOUNTER — Telehealth: Payer: Self-pay | Admitting: Medical

## 2020-03-10 ENCOUNTER — Ambulatory Visit: Payer: Medicare Other | Admitting: Medical

## 2020-03-10 ENCOUNTER — Other Ambulatory Visit: Payer: BC Managed Care – PPO

## 2020-03-10 ENCOUNTER — Encounter: Payer: Self-pay | Admitting: Medical

## 2020-03-10 ENCOUNTER — Encounter: Admit: 2020-03-10 | Discharge: 2020-03-11 | Payer: MEDICARE

## 2020-03-10 DIAGNOSIS — K7469 Other cirrhosis of liver: Principal | ICD-10-CM

## 2020-03-10 DIAGNOSIS — B181 Chronic viral hepatitis B without delta-agent: Principal | ICD-10-CM

## 2020-03-10 DIAGNOSIS — I8510 Secondary esophageal varices without bleeding: Principal | ICD-10-CM

## 2020-03-10 DIAGNOSIS — K766 Portal hypertension: Principal | ICD-10-CM

## 2020-03-10 DIAGNOSIS — R739 Hyperglycemia, unspecified: Secondary | ICD-10-CM

## 2020-03-10 DIAGNOSIS — R918 Other nonspecific abnormal finding of lung field: Secondary | ICD-10-CM

## 2020-03-10 LAB — BASIC METABOLIC PANEL
BUN: 11 mg/dL (ref 7–25)
CO2: 32 mmol/L (ref 20–32)
Calcium: 9 mg/dL (ref 8.6–10.3)
Chloride: 106 mmol/L (ref 98–110)
Creat: 1 mg/dL (ref 0.70–1.25)
Glucose, Bld: 204 mg/dL — ABNORMAL HIGH (ref 65–99)
Potassium: 4.8 mmol/L (ref 3.5–5.3)
Sodium: 144 mmol/L (ref 135–146)

## 2020-03-10 NOTE — Progress Notes (Unsigned)
Pt has CT scheduled tomorrow and needs CMP stat per radiology.  Pt has lab appt scheduled for today.  Future order placed.

## 2020-03-10 NOTE — Telephone Encounter (Signed)
Referral placed. Imaging will call him today for an appt.

## 2020-03-10 NOTE — Telephone Encounter (Signed)
Cmp stat order placed.

## 2020-03-11 ENCOUNTER — Encounter: Payer: Self-pay | Admitting: Medical

## 2020-03-11 ENCOUNTER — Telehealth: Payer: Self-pay

## 2020-03-11 ENCOUNTER — Ambulatory Visit (HOSPITAL_BASED_OUTPATIENT_CLINIC_OR_DEPARTMENT_OTHER)
Admission: RE | Admit: 2020-03-11 | Discharge: 2020-03-11 | Disposition: A | Discharge status: Home / Self Care | Payer: BC Managed Care – PPO | Source: Ambulatory Visit | Attending: Medical | Admitting: Medical

## 2020-03-11 DIAGNOSIS — R918 Other nonspecific abnormal finding of lung field: Secondary | ICD-10-CM | POA: Insufficient documentation

## 2020-03-11 MED ORDER — IOHEXOL 300 MG/ML  SOLN
100.0000 mL | Freq: Once | INTRAMUSCULAR | Status: AC | PRN
  Administered 2020-03-11: 75 mL via INTRAVENOUS

## 2020-03-11 NOTE — Telephone Encounter (Signed)
Noted. Saw ct report.

## 2020-03-11 NOTE — Telephone Encounter (Signed)
[facility] imaging called reporting abnormal CT chest. Dense Consolidation RLL likely Pneumonia. Follow up CT chest without contrast recommended after antibiotic therapy.   See imaging report from 03/11/2020

## 2020-03-12 ENCOUNTER — Telehealth: Payer: Self-pay | Admitting: Medical

## 2020-03-12 ENCOUNTER — Telehealth: Payer: Self-pay | Admitting: *Deleted

## 2020-03-12 MED ORDER — AZITHROMYCIN 250 MG PO TABS: ORAL_TABLET | ORAL | 0 refills | Status: DC

## 2020-03-12 NOTE — Telephone Encounter (Signed)
Patient wanted to know if he should get the repeat CT soon since he did not hold his breath which may have caused a blockage(?)?  Do you want him to still wait to get his Moderna booster vaccine?

## 2020-03-12 NOTE — Telephone Encounter (Signed)
Rx azithromycin sent to pt pharmacy. 

## 2020-03-12 NOTE — Telephone Encounter (Signed)
Spoke with patient this morning

## 2020-03-12 NOTE — Telephone Encounter (Signed)
Patient would like to speak to you in regard to ct results

## 2020-03-13 NOTE — Telephone Encounter (Signed)
No need to repeat ct presentl/immediate. But do recommend repeating ct after you finish azithromycin. Repeat ct in about 10 days.  Recommend getting booster in about 10-14 days.

## 2020-03-14 ENCOUNTER — Telehealth: Payer: Self-pay | Admitting: Medical

## 2020-03-14 DIAGNOSIS — R918 Other nonspecific abnormal finding of lung field: Secondary | ICD-10-CM

## 2020-03-14 DIAGNOSIS — R519 Headache, unspecified: Secondary | ICD-10-CM

## 2020-03-14 DIAGNOSIS — M542 Cervicalgia: Secondary | ICD-10-CM

## 2020-03-14 MED ORDER — ONDANSETRON 4 MG PO TBDP: 4.0000 mg | ORAL_TABLET | Freq: Three times a day (TID) | ORAL | 0 refills | Status: DC | PRN

## 2020-03-14 NOTE — Telephone Encounter (Signed)
Referral to new neurologist.

## 2020-03-14 NOTE — Telephone Encounter (Signed)
Future chest ct placed. Send to get prior auth

## 2020-03-14 NOTE — Telephone Encounter (Signed)
zofran rx sen to pharmacy.

## 2020-03-15 ENCOUNTER — Encounter: Payer: Self-pay | Admitting: Medical

## 2020-03-15 ENCOUNTER — Telehealth: Payer: Self-pay | Admitting: Medical

## 2020-03-15 DIAGNOSIS — R197 Diarrhea, unspecified: Secondary | ICD-10-CM

## 2020-03-15 NOTE — Telephone Encounter (Signed)
Future stool panel order placed.

## 2020-03-16 ENCOUNTER — Encounter: Payer: Self-pay | Admitting: Medical

## 2020-03-16 ENCOUNTER — Other Ambulatory Visit: Payer: BC Managed Care – PPO

## 2020-03-16 ENCOUNTER — Other Ambulatory Visit: Payer: Self-pay

## 2020-03-16 ENCOUNTER — Ambulatory Visit (INDEPENDENT_AMBULATORY_CARE_PROVIDER_SITE_OTHER): Payer: BC Managed Care – PPO

## 2020-03-16 DIAGNOSIS — R197 Diarrhea, unspecified: Secondary | ICD-10-CM

## 2020-03-16 MED ORDER — CEFTRIAXONE SODIUM 1 G IJ SOLR
1.0000 g | Freq: Once | INTRAMUSCULAR | Status: AC
  Administered 2020-03-16: 1 g via INTRAMUSCULAR

## 2020-03-16 NOTE — Progress Notes (Addendum)
Patient here for Rocephin 1g. Medication given in right upper outer quadrant. Patient tolerated well. He states he has been experiencing diarrhea-possibly medication related. He would like to know what to take for it? I mentioned imodium but he said "that has magnesium in it" so he can not take it. I am unaware of his history involving magnesium.    Please advise on anti-diarrhea medication patient can take.   I am seeing pt tomorrow/on Friday. Will discuss with him.  Agree with administration of rocephin.  [name], PA-C

## 2020-03-16 NOTE — Telephone Encounter (Signed)
Told pt at nurse visit

## 2020-03-18 DIAGNOSIS — K7469 Other cirrhosis of liver: Secondary | ICD-10-CM | POA: Diagnosis not present

## 2020-03-18 DIAGNOSIS — K769 Liver disease, unspecified: Secondary | ICD-10-CM | POA: Diagnosis not present

## 2020-03-18 DIAGNOSIS — R918 Other nonspecific abnormal finding of lung field: Secondary | ICD-10-CM | POA: Diagnosis not present

## 2020-03-18 DIAGNOSIS — Z79899 Other long term (current) drug therapy: Secondary | ICD-10-CM | POA: Diagnosis not present

## 2020-03-18 DIAGNOSIS — I8500 Esophageal varices without bleeding: Secondary | ICD-10-CM | POA: Diagnosis not present

## 2020-03-18 DIAGNOSIS — B181 Chronic viral hepatitis B without delta-agent: Secondary | ICD-10-CM | POA: Diagnosis not present

## 2020-03-19 ENCOUNTER — Other Ambulatory Visit: Payer: Self-pay

## 2020-03-19 ENCOUNTER — Encounter: Payer: Self-pay | Admitting: Medical

## 2020-03-19 ENCOUNTER — Ambulatory Visit (INDEPENDENT_AMBULATORY_CARE_PROVIDER_SITE_OTHER): Payer: BC Managed Care – PPO | Admitting: Medical

## 2020-03-19 VITALS — BP 113/40 | HR 76 | Resp 18 | Ht 70.0 in | Wt 246.4 lb

## 2020-03-19 DIAGNOSIS — F3130 Bipolar disorder, current episode depressed, mild or moderate severity, unspecified: Secondary | ICD-10-CM | POA: Diagnosis not present

## 2020-03-19 DIAGNOSIS — J189 Pneumonia, unspecified organism: Secondary | ICD-10-CM

## 2020-03-19 DIAGNOSIS — G4700 Insomnia, unspecified: Secondary | ICD-10-CM

## 2020-03-19 DIAGNOSIS — R197 Diarrhea, unspecified: Secondary | ICD-10-CM

## 2020-03-19 NOTE — Patient Instructions (Signed)
Diarrhea gradually improving now that you are off antbiotic. Hx of some loose stools even before antibiotics but now appearing get back to baseline. Continue lomotil. Discuss with your GI MD if taking ibs-D meds indicated.   For pneumonia vs lung mass get ct scan of chest now that you are done with antibiotics.Ct scheduled for next Thursday.  For insomnia continue melatonin and low dose zanax 0.25 mg at night. You may sleep better in near future when on respiridal. Also recommend that you see if psychiatrist would consider titrating up on xanax dosage at night.  For tooth abscess follow up with dentist. If recurrent tooth pain before then let me know. Presently don't want to give more antibiotics in light of your recent loose stools.  Follow up next Friday or as needed.

## 2020-03-19 NOTE — Progress Notes (Signed)
Subjective:    Patient ID: Angel [name], male    DOB: [birth year], 64 y.o.   MRN: [id number]  HPI  Pt in for follow up.  Pt has rt tooth abscess. Pt has upcoming appointment 03-30-2020. No tooth pain. Had been on antibiotics  Pt had diarrhea has improved. Seemed to occur with antibiotics. Gradually getting better. He is still on lomotil that his GI MD rx'd. Pt will follow up with GI MD on Apr 19, 2020. He may get same cancellation appointment if needed during the interim. Recent stool studies have come back negative. But o and p still pending.  Pt has some insomnia severe in the past. Pt was told by not by liver specialist to take naldolol at nihgt. In past tried melatonin 30 mg at night. Used to be trazadone 3 tab at night.  That was stopped. He may be put on respiradol injection by psychiatrist for mood disorder. He is currently on 0.25 mg xanax at night. He tried [place] but sleep walked and had to stop.  Possible pneumonia on xray vs dense consolidation. Need to rule out/make sure no underlying neoplasm per radiologist after antibiotics which have now been finished.    Review of Systems  Constitutional: Negative for chills and fatigue.  HENT:       No teeth pain presently.  Respiratory: Negative for cough, chest tightness, shortness of breath and wheezing.   Cardiovascular: Negative for chest pain and palpitations.  Gastrointestinal: Positive for diarrhea. Negative for abdominal pain, blood in stool, nausea and vomiting.       Now off antibitoic gradually getting better.  Genitourinary: Negative for dysuria, flank pain and frequency.  Musculoskeletal: Negative for back pain.  Skin: Negative for rash.  Neurological: Negative for dizziness.  Hematological: Negative for adenopathy. Does not bruise/bleed easily.  Psychiatric/Behavioral: Positive for sleep disturbance. Negative for behavioral problems, hallucinations, self-injury and suicidal ideas.    Past Medical History:    Diagnosis Date  . Bipolar disorder ([place])   . 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 Depression   . GERD (gastroesophageal reflux disease)   . Headache   . Hepatitis B    Hepatitis E. antigen positive by history, status post treatment with Hepsera and baraclude   . Hepatitis B carrier ([place])   . Personal history of colonic polyps    Adenomas polyp 2008 and 2010, 2015   . Weight loss    Pt. has lost 10 lbs since pre-visit, intentionally with diet and walking     Social History   Socioeconomic History  . Marital status: Married    Spouse name: Angel [name]  . Number of children: 0  . Years of education: Not on file  . Highest education level: Master's degree (e.g., MA, MS, MEng, MEd, MSW, MBA)  Occupational History  . Occupation: retired    Fish farm manager: NOT EMPLOYED  Tobacco Use  . Smoking status: Former Smoker    Types: Cigarettes    Quit date: 05/08/2009    Years since quitting: 10.8  . Smokeless tobacco: Never Used  Vaping Use  . Vaping Use: Never used  Substance and Sexual Activity  . Alcohol use: No    Alcohol/week: 0.0 standard drinks  . Drug use: No  . Sexual activity: Not Currently  Other Topics Concern  . Not on file  Social History Narrative   Patient is right-handed. He is married to same sex partner. He gets little exercise.   Caffeine: 4 glasses of  tea max/day   Social Determinants of Health   Financial Resource Strain:   . Difficulty of Paying Living Expenses: Not on file  Food Insecurity:   . Worried About Charity fundraiser in the Last Year: Not on file  . Ran Out of Food in the Last Year: Not on file  Transportation Needs:   . Lack of Transportation (Medical): Not on file  . Lack of Transportation (Non-Medical): Not on file  Physical Activity:   . Days of Exercise per Week: Not on file  . Minutes of Exercise per Session: Not on file  Stress:   . Feeling of Stress : Not on file  Social Connections:   . Frequency of Communication with  Friends and Family: Not on file  . Frequency of Social Gatherings with Friends and Family: Not on file  . Attends Religious Services: Not on file  . Active Member of Clubs or Organizations: Not on file  . Attends Archivist Meetings: Not on file  . Marital Status: Not on file  Intimate Partner Violence:   . Fear of Current or Ex-Partner: Not on file  . Emotionally Abused: Not on file  . Physically Abused: Not on file  . Sexually Abused: Not on file    Past Surgical History:  Procedure Laterality Date  . APPENDECTOMY    . COLONOSCOPY    . POLYPECTOMY    . UPPER GASTROINTESTINAL ENDOSCOPY      Family History  Problem Relation Age of Onset  . Heart disease Mother   . Diabetes Father   . Kidney failure Father   . Kidney disease Father   . Other Sister        GERD  . Colon cancer Neg Hx   . Rectal cancer Neg Hx   . Stomach cancer Neg Hx   . Esophageal cancer Neg Hx   . Headache Neg Hx   . Migraines Neg Hx     Allergies  Allergen Reactions  . Aripiprazole Other (See Comments)    Tardive Dyskinesia  . Invega [Paliperidone Er] Other (See Comments)    Tardive dyskinsia symptoms  . Quetiapine Diarrhea, Hives, Itching, Nausea And Vomiting, Nausea Only and Shortness Of Breath  . Abilify [Aripiprazole]   . Hycodan [Hydrocodone-Homatropine]   . Hydrocodone   . Lamictal [Lamotrigine] Rash  . Lexapro [Escitalopram Oxalate] Other (See Comments)    Doesn't Work    Current Outpatient Medications on File Prior to Visit  Medication Sig Dispense Refill  . Accu-Chek Softclix Lancets lancets Check sugar TID . Dx code:R73.9 300 each 1  . ALPRAZolam (XANAX) 0.25 MG tablet Take 0.25 mg by mouth at bedtime.    . Blood Glucose Monitoring Suppl (ACCU-CHEK AVIVA PLUS) w/Device KIT Check sugar TID . Dx code:R73.9 1 kit 0  . diphenoxylate-atropine (LOMOTIL) 2.5-0.025 MG tablet Take by mouth.    . fluticasone (FLONASE) 50 MCG/ACT nasal spray Place 2 sprays into both nostrils daily.  16 g 1  . glucose blood test strip 1 each by Other route 2 (two) times daily. 100 each 12  . Lancet Devices (LANCING DEVICE) MISC 1 Device by Does not apply route as directed. 1 each 1  . lithium 600 MG capsule TAKE 1 CAPSULE BY MOUTH EVERY MORNING AND 2 CAPSULES EVERY NIGHT AT BEDTIME 90 capsule 5  . loratadine (CLARITIN) 10 MG tablet Take 1 tablet (10 mg total) by mouth daily. 30 tablet 0  . Melatonin 10 MG SUBL Place 10 mg under  the tongue at bedtime.    . nadolol (CORGARD) 40 MG tablet TAKE 1 TABLET BY MOUTH EVERY DAY 30 tablet 0  . ondansetron (ZOFRAN ODT) 4 MG disintegrating tablet Take 1 tablet (4 mg total) by mouth every 8 (eight) hours as needed for nausea or vomiting. 20 tablet 0  . Pancrelipase, Lip-Prot-Amyl, (CREON PO) Take by mouth. 2400 units 8 times a day    . Tenofovir Alafenamide Fumarate (VEMLIDY) 25 MG TABS Take by mouth.    . tizanidine (ZANAFLEX) 2 MG capsule Take 1-2 capsules (2-4 mg total) by mouth 3 (three) times daily as needed for muscle spasms. 60 capsule 1  . amoxicillin-clavulanate (AUGMENTIN) 875-125 MG tablet Take 1 tablet by mouth 2 (two) times daily. (Patient not taking: Reported on 03/09/2020) 20 tablet 0  . azithromycin (ZITHROMAX) 250 MG tablet Take 2 tablets by mouth on day 1, followed by 1 tablet by mouth daily for 4 days. (Patient not taking: Reported on 03/19/2020) 6 tablet 0  . benzonatate (TESSALON) 100 MG capsule Take 1 capsule (100 mg total) by mouth 3 (three) times daily as needed for cough. (Patient not taking: Reported on 03/09/2020) 30 capsule 0  . methocarbamol (ROBAXIN) 750 MG tablet Take 1 tablet (750 mg total) by mouth every 8 (eight) hours as needed for muscle spasms. 90 tablet 0  . ondansetron (ZOFRAN) 4 MG tablet Take 4 mg by mouth every 8 (eight) hours as needed. (Patient not taking: Reported on 03/09/2020)     No current facility-administered medications on file prior to visit.    BP (!) 113/40   Pulse 76   Resp 18   Ht $R'5\' 10"'XI$  (1.778 m)    Wt 246 lb 6.4 oz (111.8 kg)   SpO2 94%   BMI 35.35 kg/m       Objective:   Physical Exam  General- No acute distress. Pleasant patient. Neck- Full range of motion, no jvd Lungs- Clear, even and unlabored. Heart- regular rate and rhythm. Neurologic- CNII- XII grossly intact.       Assessment & Plan:  Diarrhea gradually improving now that you are off antbiotic. Hx of some loose stools even before antibiotics but now appearing get back to baseline. Continue lomotil. Discuss with your GI MD if taking ibs-D meds indicated.   For pneumonia vs lung mass get ct scan of chest now that you are done with antibiotics.Ct scheduled for next Thursday.  For insomnia continue melatonin and low dose zanax 0.25 mg at night. You may sleep better in near future when on respiridal. Also recommend that you see if psychiatrist would consider titrating up on xanax dosage at night.  For tooth abscess follow up with dentist. If recurrent tooth pain before then let me know. Presently don't want to give more antibiotics in light of your recent loose stools.  Follow up next Friday or as needed.  Time spent with patient today was 40  minutes which consisted of chart review, discussing diagnosis, work up, treatment, answering all questions and documentation.

## 2020-03-20 ENCOUNTER — Encounter: Payer: Self-pay | Admitting: Medical

## 2020-03-20 ENCOUNTER — Encounter: Payer: Self-pay | Admitting: Physical Therapy

## 2020-03-22 ENCOUNTER — Encounter: Payer: Self-pay | Admitting: Medical

## 2020-03-22 DIAGNOSIS — B181 Chronic viral hepatitis B without delta-agent: Principal | ICD-10-CM

## 2020-03-22 DIAGNOSIS — K7460 Unspecified cirrhosis of liver: Principal | ICD-10-CM

## 2020-03-22 DIAGNOSIS — Z1289 Encounter for screening for malignant neoplasm of other sites: Principal | ICD-10-CM

## 2020-03-23 LAB — SALMONELLA/SHIGELLA CULT, CAMPY EIA AND SHIGA TOXIN RFL ECOLI
MICRO NUMBER: 11179631
MICRO NUMBER:: 11179632
MICRO NUMBER:: 11179634
Result:: NOT DETECTED
SHIGA RESULT:: NOT DETECTED
SPECIMEN QUALITY: ADEQUATE
SPECIMEN QUALITY:: ADEQUATE
SPECIMEN QUALITY:: ADEQUATE

## 2020-03-23 LAB — OVA AND PARASITE EXAMINATION
CONCENTRATE RESULT:: NONE SEEN
MICRO NUMBER:: 11179633
SPECIMEN QUALITY:: ADEQUATE
TRICHROME RESULT:: NONE SEEN

## 2020-03-23 LAB — CLOSTRIDIUM DIFFICILE TOXIN B, QUALITATIVE, REAL-TIME PCR: Toxigenic C. Difficile by PCR: NOT DETECTED

## 2020-03-23 MED ORDER — NADOLOL 20 MG TABLET
ORAL_TABLET | Freq: Every day | ORAL | 3 refills | 90.00000 days | Status: CP
Start: 2020-03-23 — End: 2020-05-13

## 2020-03-24 ENCOUNTER — Telehealth: Payer: Self-pay | Admitting: Medical

## 2020-03-24 DIAGNOSIS — G4700 Insomnia, unspecified: Secondary | ICD-10-CM | POA: Diagnosis not present

## 2020-03-24 DIAGNOSIS — M542 Cervicalgia: Secondary | ICD-10-CM

## 2020-03-24 DIAGNOSIS — F319 Bipolar disorder, unspecified: Secondary | ICD-10-CM | POA: Diagnosis not present

## 2020-03-24 NOTE — Telephone Encounter (Signed)
Opened to refer to PT.

## 2020-03-25 ENCOUNTER — Ambulatory Visit (HOSPITAL_BASED_OUTPATIENT_CLINIC_OR_DEPARTMENT_OTHER)
Admission: RE | Admit: 2020-03-25 | Discharge: 2020-03-25 | Disposition: A | Discharge status: Home / Self Care | Payer: BC Managed Care – PPO | Source: Ambulatory Visit | Attending: Medical | Admitting: Medical

## 2020-03-25 ENCOUNTER — Other Ambulatory Visit: Payer: Self-pay

## 2020-03-25 ENCOUNTER — Encounter: Payer: Self-pay | Admitting: Medical

## 2020-03-25 DIAGNOSIS — R918 Other nonspecific abnormal finding of lung field: Secondary | ICD-10-CM | POA: Diagnosis not present

## 2020-03-25 DIAGNOSIS — J181 Lobar pneumonia, unspecified organism: Secondary | ICD-10-CM | POA: Diagnosis not present

## 2020-03-25 DIAGNOSIS — Q766 Other congenital malformations of ribs: Secondary | ICD-10-CM | POA: Diagnosis not present

## 2020-03-26 ENCOUNTER — Telehealth (INDEPENDENT_AMBULATORY_CARE_PROVIDER_SITE_OTHER): Payer: BC Managed Care – PPO | Admitting: Medical

## 2020-03-26 DIAGNOSIS — K047 Periapical abscess without sinus: Secondary | ICD-10-CM

## 2020-03-26 DIAGNOSIS — M542 Cervicalgia: Secondary | ICD-10-CM | POA: Diagnosis not present

## 2020-03-26 DIAGNOSIS — R197 Diarrhea, unspecified: Secondary | ICD-10-CM

## 2020-03-26 DIAGNOSIS — F3130 Bipolar disorder, current episode depressed, mild or moderate severity, unspecified: Secondary | ICD-10-CM

## 2020-03-26 DIAGNOSIS — B180 Chronic viral hepatitis B with delta-agent: Secondary | ICD-10-CM | POA: Diagnosis not present

## 2020-03-26 DIAGNOSIS — R9389 Abnormal findings on diagnostic imaging of other specified body structures: Secondary | ICD-10-CM

## 2020-03-26 DIAGNOSIS — K7460 Unspecified cirrhosis of liver: Secondary | ICD-10-CM | POA: Diagnosis not present

## 2020-03-26 NOTE — Progress Notes (Signed)
Subjective:    Patient ID: Angel [name], male    DOB: [birth year], 64 y.o.   MRN: [id number]  HPI  Virtual Visit via Video Note  I connected with Angel [name] on 03/26/20 at  1:00 PM EST by a video enabled telemedicine application and verified that I am speaking with the correct person using two identifiers.  Location: Patient: home Provider: office   I discussed the limitations of evaluation and management by telemedicine and the availability of in person appointments. The patient expressed understanding and agreed to proceed.  History of Present Illness: Pt has hx of neck pain associated with headache. Pt has been scheduled for dry needling in December.   Pt is seeing dentist this coming Tuesday for tooth infection/abscess.   Pt diarrhea has resolved as he finished antibiotic.  Pt also will see [facility] next week.  Pt has bipolar. May start respiradol injection.  His psychiatrist double his dosage of xanax for anxiety/insomnia as well.  May add doxepin if xanax does not help him sleep.  Pt had CT scan of chest yesterday. Results pending.   Observations/Objective:  General-no acute distress, pleasant, oriented. Lungs- on inspection lungs appear unlabored. Neck- no tracheal deviation or jvd on inspection. Neuro- gross motor function appears intact.  Assessment and Plan: You do have history of neck pain associated with headaches.  Needling has helped in the past.  I did place referral to physical therapy and glad to hear you are scheduled for December.  Recent tooth infection/abscess.  Keep appointment with dentist on Tuesday.  History of intermittent chronic diarrhea.  Resolved recently since she stopped antibiotic for tooth infection.  Continue with Lomotil as her GI doctor prescribed and attend upcoming appointment.  Would recommend that you get opinion from GI MD whether or not they think you might have IBS-D.  History of bipolar and upcoming appointment with  psychiatry to determine whether or not she will get Risperdal injection.  For anxiety and insomnia Xanax dose increased recently.  Hopefully that will be adequate.  If not I do agree that doxepin might be a good add-on option.  For history of abnormal CT chest recently CT was repeated yesterday.  Radiology report was not back yet.  I did call over and talk with reception staff.  They are going to get the CT read within the hour and then I will review it.  We will update you once that is done.  Follow-up in 6 to 8 weeks or sooner if needed.  [name], PA-C   Time spent with patient today was   minutes which consisted of chart revdiew, discussing diagnosis, work up treatment and documentation.  Follow Up Instructions:    I discussed the assessment and treatment plan with the patient. The patient was provided an opportunity to ask questions and all were answered. The patient agreed with the plan and demonstrated an understanding of the instructions.   The patient was advised to call back or seek an in-person evaluation if the symptoms worsen or if the condition fails to improve as anticipated.  Time spent with patient today was 30  minutes which consisted of chart revdiew, discussing diagnosis, work up treatment and documentation.   [name], PA-C   Review of Systems  Constitutional: Negative for chills, fatigue and fever.  HENT: Negative for congestion.   Respiratory: Negative for cough, chest tightness, shortness of breath and wheezing.   Cardiovascular: Negative for chest pain and palpitations.  Gastrointestinal: Negative for  abdominal pain, blood in stool, constipation and diarrhea.  Genitourinary: Negative for enuresis, genital sores, testicular pain and urgency.  Musculoskeletal: Negative for back pain, joint swelling and neck pain.  Skin: Negative for rash.  Neurological: Negative for dizziness, syncope, facial asymmetry, speech difficulty, weakness, numbness and  headaches.  Hematological: Negative for adenopathy. Does not bruise/bleed easily.  Psychiatric/Behavioral: Negative for behavioral problems, confusion, dysphoric mood, self-injury and suicidal ideas. The patient is not nervous/anxious.        Objective:   Physical Exam        Assessment & Plan:

## 2020-03-26 NOTE — Patient Instructions (Signed)
You do have history of neck pain associated with headaches.  Needling has helped in the past.  I did place referral to physical therapy and glad to hear you are scheduled for December.  Recent tooth infection/abscess.  Keep appointment with dentist on Tuesday.  History of intermittent chronic diarrhea.  Resolved recently since she stopped antibiotic for tooth infection.  Continue with Lomotil as her GI doctor prescribed and attend upcoming appointment.  Would recommend that you get opinion from GI MD whether or not they think you might have IBS-D.  History of bipolar and upcoming appointment with psychiatry to determine whether or not she will get Risperdal injection.  For anxiety and insomnia Xanax dose increased recently.  Hopefully that will be adequate.  If not I do agree that doxepin might be a good add-on option.  For history of abnormal CT chest recently CT was repeated yesterday.  Radiology report was not back yet.  I did call over and talk with reception staff.  They are going to get the CT read within the hour and then I will review it.  We will update you once that is done.  Follow-up in 6 to 8 weeks or sooner if needed.

## 2020-03-27 ENCOUNTER — Telehealth: Payer: Self-pay | Admitting: Medical

## 2020-03-27 DIAGNOSIS — R197 Diarrhea, unspecified: Secondary | ICD-10-CM | POA: Diagnosis not present

## 2020-03-27 DIAGNOSIS — R918 Other nonspecific abnormal finding of lung field: Secondary | ICD-10-CM

## 2020-03-27 NOTE — Telephone Encounter (Signed)
Referral to pulmonologist placed. 

## 2020-03-29 ENCOUNTER — Telehealth: Payer: Self-pay

## 2020-03-29 ENCOUNTER — Encounter: Payer: Self-pay | Admitting: Medical

## 2020-03-29 NOTE — Telephone Encounter (Signed)
Patient called stating he is not unable to see pulmonology until the end of jan.   And wants a referral to [facility] [place] [facility] [phone].

## 2020-03-30 ENCOUNTER — Other Ambulatory Visit: Payer: Self-pay | Admitting: *Deleted

## 2020-03-30 ENCOUNTER — Ambulatory Visit: Payer: Medicare Other | Admitting: Skilled Nursing Facility1

## 2020-03-30 ENCOUNTER — Telehealth: Payer: Self-pay | Admitting: Medical

## 2020-03-30 ENCOUNTER — Encounter: Payer: Self-pay | Admitting: *Deleted

## 2020-03-30 DIAGNOSIS — R918 Other nonspecific abnormal finding of lung field: Secondary | ICD-10-CM

## 2020-03-30 MED ORDER — TIZANIDINE HCL 2 MG PO CAPS
2.0000 mg | ORAL_CAPSULE | Freq: Three times a day (TID) | ORAL | 0 refills | Status: DC | PRN
Start: 2020-03-30 — End: 2020-04-12

## 2020-03-30 NOTE — Telephone Encounter (Signed)
This is the 3 rd referral. Sorry pt keeps changing. This would be his preference now to significant cost savings. Asking him to give you direct number to call.

## 2020-03-30 NOTE — Telephone Encounter (Signed)
We received a request from [facility] for Tizanidine 4 mg. However that had previously been d/c'd and switched to the 2 mg tablet per previous mychart discussion between Dr Jaynee Eagles and patient. I spoke with Dr Jaynee Eagles and she advised to refill the 2 mg tablets.

## 2020-03-30 NOTE — Telephone Encounter (Signed)
Referral to pulmonlogist placed.

## 2020-03-30 NOTE — Telephone Encounter (Signed)
New referral to pulmonologist at [facility]. Please resend as internal referral delayed appointment. Pt request earlier/december appointment.

## 2020-03-31 ENCOUNTER — Encounter: Payer: Self-pay | Admitting: *Deleted

## 2020-03-31 NOTE — Telephone Encounter (Signed)
[place] [phone]. Patient would like referral to be STAT

## 2020-04-05 ENCOUNTER — Other Ambulatory Visit: Payer: Self-pay

## 2020-04-05 ENCOUNTER — Ambulatory Visit: Payer: BC Managed Care – PPO | Admitting: Specialist

## 2020-04-05 ENCOUNTER — Ambulatory Visit: Admit: 2020-04-05 | Discharge: 2020-04-06 | Payer: MEDICARE

## 2020-04-05 DIAGNOSIS — R9389 Abnormal findings on diagnostic imaging of other specified body structures: Principal | ICD-10-CM

## 2020-04-05 NOTE — Telephone Encounter (Signed)
Fax number included within [employer]

## 2020-04-06 ENCOUNTER — Encounter: Payer: Self-pay | Admitting: Medical

## 2020-04-06 ENCOUNTER — Telehealth: Payer: Self-pay | Admitting: Neurology

## 2020-04-06 ENCOUNTER — Ambulatory Visit: Payer: BC Managed Care – PPO | Admitting: Specialist

## 2020-04-06 DIAGNOSIS — G4700 Insomnia, unspecified: Secondary | ICD-10-CM | POA: Diagnosis not present

## 2020-04-06 DIAGNOSIS — F319 Bipolar disorder, unspecified: Secondary | ICD-10-CM | POA: Diagnosis not present

## 2020-04-06 NOTE — Telephone Encounter (Signed)
I received a heath and safety notice about prescribing tizanidine for patient as follows: "Drug Safety Consideration: TIZANIDINE HCL and HEPATIC DYSFUNCTION Our claims record suggests that your patient is taking TIZANIDINE HCL and has a history of HEPATIC DYSFUNCTION. Labeling warns that tizanidine should be avoided or only be used with extreme caution in patients with impaired hepatic function". I was not aware of any significant hepatic dysfunction but due to this, at this time I would like to defer to his primary care on any further tizanidine prescriptions, please let patient know and I will add Dr. Harvie Heck to this for his fyi

## 2020-04-07 ENCOUNTER — Telehealth: Payer: Self-pay | Admitting: Medical

## 2020-04-07 ENCOUNTER — Encounter: Payer: Self-pay | Admitting: Medical

## 2020-04-07 NOTE — Telephone Encounter (Signed)
Angel [name],  Below is the message pt sent me. Do you think this is realistic to give in our office? I think you would be go to person and probably would not work out since you can't be obligated to be on site every 2 weeks.      My phychatrist is contemplating putting me on Respirdol Consta sometime around December 13-15th, 2021. A shot has to be given every 2 weeks in the hip by a certified nurse that may need to be an RN, I am not sure, but be comfortable with mixing the shot ( I used to pick it up from the pharmacy that keeps it refrigerated so it does not have to be stored there).  This would prevent me from traveling to [facility] every 2 weeks.      The other alternative MAY be to have it dispensed and administered at [facility].  A new law passed this month that allows shots to be given by the nurse at the pharmacy if they meet certain criteria but not all pharmacies are up to par on this yet. Would your office be amenable to performing this procedure? Thanks, [name]

## 2020-04-08 ENCOUNTER — Ambulatory Visit: Payer: Medicare Other | Admitting: Dietician

## 2020-04-08 ENCOUNTER — Other Ambulatory Visit: Payer: Self-pay | Admitting: Medical

## 2020-04-08 NOTE — Telephone Encounter (Signed)
Will discuss with pt tomorrow on video visit.

## 2020-04-08 NOTE — Telephone Encounter (Signed)
We should be able to accommodate patient in clinic.  Package insert states to be given by healthcare professional, therefore [place] would be able to administer.  I will plan to administer if/when I am in office.   Please clarify how strict is every 2 week injection schedule.  If we plan to proceed, Wednesday-Friday are best days for nurse visit.   Keep me posted and I'll be happy to attempt to schedule on the days I am here.

## 2020-04-09 ENCOUNTER — Telehealth (INDEPENDENT_AMBULATORY_CARE_PROVIDER_SITE_OTHER): Payer: BC Managed Care – PPO | Admitting: Medical

## 2020-04-09 ENCOUNTER — Other Ambulatory Visit: Payer: Self-pay

## 2020-04-09 DIAGNOSIS — R918 Other nonspecific abnormal finding of lung field: Secondary | ICD-10-CM | POA: Diagnosis not present

## 2020-04-09 DIAGNOSIS — F3130 Bipolar disorder, current episode depressed, mild or moderate severity, unspecified: Secondary | ICD-10-CM | POA: Diagnosis not present

## 2020-04-09 NOTE — Patient Instructions (Signed)
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  Did refer to [facility] pulmonology.  It appears that he was eventually referred to oncology.  On review of chart it appears that interventional radiology did evaluate the images.  I did call [facility] system and had to leave message with Investment banker, operational.  I believe her last name was Dowdy.  I left message asking for her to call me back.  I explained would like referral to be expedited.  Note it appears that the department I called if interventional radiology, interventional pulmonology and oncology.  For history of bipolar, asked patient to clarify with psychiatrist if a medical assistant can give the Risperdal Consta injection.  If not then I do think it is best for patient to get injections through the pharmacist if possible.  Follow-up date to be determined after specialist appointment for pulmonary mass.

## 2020-04-09 NOTE — Progress Notes (Addendum)
   Subjective:    Patient ID: Angel [name], male    DOB: 12/14/1955, 64 y.o.   MRN: [id number]  HPI  Virtual Visit via Video Note  I connected with [name] on 04/09/20 at 11:00 AM EST by a video enabled telemedicine application and verified that I am speaking with the correct person using two identifiers.  Location: Patient: home Provider: office   I discussed the limitations of evaluation and management by telemedicine and the availability of in person appointments. The patient expressed understanding and agreed to proceed.  History of Present Illness: Pt follow up on possible pulmonary mass.   Pt states [facility] radiologist states he may have old pneumonia scar or partially collapsed lung. Pt states pulmonologist never reviewed scans as far as he knows.  Pt states he was referred to oncologist.   I had referred him to various locations to attempt to have him see a pulmonologist.  In the end he decided he wanted to be seen by [facility] since that would be better covered/less expensive.  Also has history of bipolar.  His psychiatrist is considering giving him Risperdal Consta.  He was told that injection had to be given by RN.  I think that might be a problem as I discussed with him that our RN is not always in the office and if she were to be sick or on vacation then we might not be able to give the injection.  Was considering talking with his pharmacist to see if they can give the injection.  Patient mood is stable presently but he is anxious about possible pulmonary mass.       Observations/Objective: General-no acute distress, pleasant, oriented. Lungs- on inspection lungs appear unlabored. Neck- no tracheal deviation or jvd on inspection. Neuro- gross motor function appears intact.  Assessment and Plan: History of possible pulmonary mass.  Did refer to [facility] pulmonology.  It appears that he was eventually referred to oncology.  On review of chart it appears that interventional  radiology did evaluate the images.  I did call [facility] system and had to leave message with Investment banker, operational.  I believe her last name was Dowdy.  I left message asking for her to call me back.  I explained would like referral to be expedited.  Note it appears that the department I called if interventional radiology, interventional pulmonology and oncology.  For history of bipolar, asked patient to clarify with psychiatrist if a medical assistant can give the Risperdal Consta injection.  If not then I do think it is best for patient to get injections through the pharmacist if possible.  Follow-up date to be determined after specialist appointment for pulmonary mass.  Follow Up Instructions:    I discussed the assessment and treatment plan with the patient. The patient was provided an opportunity to ask questions and all were answered. The patient agreed with the plan and demonstrated an understanding of the instructions.   The patient was advised to call back or seek an in-person evaluation if the symptoms worsen or if the condition fails to improve as anticipated.  Time spent with patient today was 30  minutes which consisted of chart review, discussing diagnosis, work up, treatment and documentation.   [facility] 5.0 (2)  Doctor [address] [phone]   [name], PA-C   Review of Systems     Objective:   Physical Exam        Assessment & Plan:

## 2020-04-12 ENCOUNTER — Ambulatory Visit: Payer: BC Managed Care – PPO | Attending: Medical | Admitting: Physical Therapy

## 2020-04-12 ENCOUNTER — Other Ambulatory Visit: Payer: Self-pay

## 2020-04-12 ENCOUNTER — Encounter: Payer: Self-pay | Admitting: *Deleted

## 2020-04-12 ENCOUNTER — Telehealth: Payer: Self-pay | Admitting: Medical

## 2020-04-12 ENCOUNTER — Encounter: Payer: Self-pay | Admitting: Physical Therapy

## 2020-04-12 ENCOUNTER — Encounter: Payer: Self-pay | Admitting: Medical

## 2020-04-12 DIAGNOSIS — M5481 Occipital neuralgia: Secondary | ICD-10-CM | POA: Insufficient documentation

## 2020-04-12 DIAGNOSIS — R293 Abnormal posture: Secondary | ICD-10-CM | POA: Diagnosis not present

## 2020-04-12 DIAGNOSIS — M542 Cervicalgia: Secondary | ICD-10-CM | POA: Insufficient documentation

## 2020-04-12 DIAGNOSIS — R29898 Other symptoms and signs involving the musculoskeletal system: Secondary | ICD-10-CM | POA: Insufficient documentation

## 2020-04-12 MED ORDER — METHOCARBAMOL 750 MG PO TABS: 750.0000 mg | ORAL_TABLET | Freq: Three times a day (TID) | ORAL | 0 refills | Status: DC | PRN

## 2020-04-12 NOTE — Telephone Encounter (Signed)
Rx limited number of robaxin sent to pt pharmacy.

## 2020-04-12 NOTE — Telephone Encounter (Signed)
You're welcome!

## 2020-04-12 NOTE — Telephone Encounter (Signed)
THANK YOU

## 2020-04-12 NOTE — Telephone Encounter (Signed)
Dr. Jaynee Eagles,  Appreciate update and thanks for helping out with our patients. Is sent message to pt by my chart asking him to stop. Talked with our pharmacist at med center. He states no issue with robaxin.  [name], PA-C

## 2020-04-12 NOTE — Therapy (Signed)
[facility] [place] [address]  [place], [place], [address] Phone: [phone]   Fax:  [phone]  Physical Therapy Evaluation  Patient Details  Name: Angel [name] MRN: [id number] Date of Birth: 05/21/1955 Referring Provider (PT): [name], [place]   Encounter Date: 04/12/2020   PT End of Session - 04/12/20 1359    Visit Number 1    Number of Visits 12    Date for PT Re-Evaluation 05/24/20    Authorization Type Medicare, [employer] & [employer]    PT Start Time 5053    PT Stop Time 1456    PT Time Calculation (min) 57 min           Past Medical History:  Diagnosis Date  . Bipolar disorder ([place])   . 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 Depression   . GERD (gastroesophageal reflux disease)   . Headache   . Hepatitis B    Hepatitis E. antigen positive by history, status post treatment with Hepsera and baraclude   . Hepatitis B carrier ([place])   . Personal history of colonic polyps    Adenomas polyp 2008 and 2010, 2015   . Weight loss    Pt. has lost 10 lbs since pre-visit, intentionally with diet and walking    Past Surgical History:  Procedure Laterality Date  . APPENDECTOMY    . COLONOSCOPY    . POLYPECTOMY    . UPPER GASTROINTESTINAL ENDOSCOPY      There were no vitals filed for this visit.    Subjective Assessment - 04/12/20 1407    Subjective Pt reports increasing neck pain. Has had 2 epidurals (ESI) with tremendous relief noted for 6 mos to 1 yr. Notes pain makes turning head difficult so has had to add convex mirrrors to his rear-view mirrors when driving. Tries to keep TV level to avoid having to look up or down. Not doing any exercise currently.    Patient Stated Goals "to have pain go down in intensity and have more movement in neck so it's easier to drive"    Currently in Pain? Yes    Pain Score 5     Pain Location Neck    Pain Orientation Right;Left;Mid;Lateral    Pain Descriptors  / Indicators Throbbing   "stiff"   Pain Type Chronic pain    Pain Radiating Towards n/a - denies radicular pain, numbness or tingling    Pain Onset More than a month ago   ~4 months   Pain Frequency Intermittent    Aggravating Factors  movement, esp turning head fast    Pain Relieving Factors cold pack, topical analgesics    Effect of Pain on Daily Activities makes driving difficult              [facility] PT Assessment - 04/12/20 1359      Assessment   Medical Diagnosis Neck pain    Referring Provider (PT) [name], PA-C    Onset Date/Surgical Date --   ~4 months   Hand Dominance Right    Next MD Visit none scheduled      [place] residence    Living Arrangements Spouse/significant other    Type of [place] to enter   front entrance only; threshold only from garage   Home Layout Two level;Bed/bath upstairs      Prior Function   Level of Independence  Independent    Vocation Retired    Hospital doctor; 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      Cognition   Overall Cognitive Status Within Functional Limits for tasks assessed      Observation/Other Assessments   Focus on Therapeutic Outcomes (FOTO)  Neck - 44% (56% limitation); Predicted 57% (43% limitation)      Posture/Postural Control   Posture/Postural Control Postural limitations    Postural Limitations Forward head;Rounded Shoulders    Posture Comments elevated shoulders      AROM   Overall AROM  Within functional limits for tasks performed;Deficits;Due to pain   B shoulders WFL - pain L>R   Cervical Flexion 36 - pain    Cervical Extension 30 - pain    Cervical - Right Side Bend 22 - pain    Cervical - Left Side Bend 10 - pain (greater L than R)    Cervical - Right Rotation 40 - pain    Cervical - Left Rotation 26 - pain (greater L than R)      Palpation   Palpation comment ttp with increased muscle tension t/o cervical paraspinals, lateral cervical  muscles and UT; increased muscle tension in B pecs (L>R) but denies ttp                      Objective measurements completed on examination: See above findings.       [facility] Adult PT Treatment/Exercise - 04/12/20 1359      Exercises   Exercises Neck      Neck Exercises: Seated   Neck Retraction 10 reps;5 secs    Other Seated Exercise Scapular retraction + depression 10 x 5"       Neck Exercises: Stretches   Upper Trapezius Stretch Right;Left;30 seconds;1 rep    Upper Trapezius Stretch Limitations hands anchored on chair     Levator Stretch Right;Left;30 seconds;1 rep    Levator Stretch Limitations cues to keep hand behind/under buttock to maintain neutral shoulder    Corner Stretch 30 seconds;3 reps    Corner Stretch Limitations 3-way doorway stretch - pt noting more of shoulder stretch than chest stretch with mid position (uncomfortable, therefore deferred from HEP with instructions only for low & high positions)                  PT Education - 04/12/20 1450    Education Details PT eval findings, anticipated POC & initial HEP    Person(s) Educated Patient    Methods Explanation;Demonstration;Verbal cues;Handout    Comprehension Verbalized understanding;Verbal cues required;Returned demonstration;Need further instruction            PT Short Term Goals - 04/12/20 1514      PT SHORT TERM GOAL #1   Title Patient to be independent with initial HEP.    Status New    Target Date 05/03/20      PT SHORT TERM GOAL #2   Title Patient will verbalize/demonstrate good awareness of neutral spine posture and proper body mechanics for daily tasks including computer usage and driving.    Status New    Target Date 05/03/20             PT Long Term Goals - 04/12/20 1515      PT LONG TERM GOAL #1   Title Patient will be independent with ongoing/advanced HEP for self-management at home    Status New    Target Date 05/24/20      PT LONG  TERM GOAL #2    Title Improve posture and alignment with patient to demonstrate improved upright posture with posterior shoulder girdle engaged    Status New    Target Date 05/24/20      PT LONG TERM GOAL #3   Title Patient to improve cervical AROM to [place] without pain provocation to allow for increased ease of driving.    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 He completed a prior PT episode for L cervical myofascial pain and occipital neuralgia from 05/26/19 - 07/07/19 with good relief of pain and improved motion but has not kept up with his HEP. He notes relief from [facility] usually lasting 6 mos to 1 yr. Pain currently most problematic with driving but also exacerbated by prolonged time on the computer. Deficits include poor posture with a forward head and rounded shoulder posture along with elevated shoulders; increased muscle tension noted throughout B cervical paraspinals, lateral cervical musculature, upper traps and pectoralis muscles; and limited and painful cervical AROM in all planes. Bilateral shoulder ROM WNL but painful L>R. Angel [name] will benefit from skilled PT to address above deficits and current functional limitations as well as to decrease pain interference with daily activities.    Personal Factors and Comorbidities Comorbidity 3+;Time since onset of injury/illness/exacerbation;Fitness;Past/Current Experience    Comorbidities lung nodules awaiting further  diagnostic testing; bipolar disorder, hepatitis B, hepatic cirrhosis, GERD, h/o falls    Examination-Activity Limitations Sleep;Reach Overhead    Examination-Participation Restrictions Community Activity;Driving    Stability/Clinical Decision Making Evolving/Moderate complexity    Clinical Decision Making Moderate    Rehab Potential Good    PT Frequency 2x / week    PT Duration 6 weeks    PT Treatment/Interventions ADLs/Self Care Home Management;Cryotherapy;Electrical Stimulation;Moist Heat;Traction;Ultrasound;Functional mobility training;Therapeutic activities;Therapeutic exercise;Balance training;Neuromuscular re-education;Patient/family education;Manual techniques;Passive range of motion;Dry needling;Taping;Spinal Manipulations;Joint Manipulations    PT Next Visit Plan Review initial HEP, brief review of posture and body mechanics; postural stretching and strengthening; manual therapy including DN as indicated for increased muscle tension    Consulted and Agree with Plan of Care Patient           Patient will benefit from skilled therapeutic intervention in order to improve the following deficits and impairments:  Decreased activity tolerance, Decreased balance, Decreased endurance, Decreased knowledge of precautions, Decreased mobility, Decreased range of motion, Decreased strength, Difficulty walking, Increased fascial restricitons, Increased muscle spasms, Impaired perceived functional ability, Impaired flexibility, Impaired UE functional use, Improper body mechanics, Postural dysfunction, Pain  Visit Diagnosis: Cervicalgia  Abnormal posture  Other symptoms and signs involving the musculoskeletal system     Problem List Patient Active Problem List   Diagnosis Date Noted  . Hypertriglyceridemia 02/11/2020  . Diabetes  mellitus without complication ([place]) 74/45/1460  . Type 2 diabetes mellitus with diabetic polyneuropathy, without long-term current use of insulin ([place]) 02/11/2020   . 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 Cervicogenic headache 12/09/2019  . Occipital neuralgia of left side 04/10/2019  . 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 Hyperglycemia 10/07/2013  . Hepatic cirrhosis ([place]) 10/03/2013  . Bipolar I disorder, most recent episode depressed ([place]) 05/21/2013  . Hepatitis B 03/21/2013  . Esophageal reflux 03/21/2013  . Personal history of colonic polyps 03/21/2013  . Bipolar disorder, unspecified ([place]) 09/03/2011    Percival Spanish, PT, MPT 04/12/2020, 3:24 PM  [facility] [address]  [place], [place], [address] Phone: [phone]   Fax:  [phone]  Name: Angel [name] MRN: [id number] Date of Birth: [birth year]

## 2020-04-12 NOTE — Patient Instructions (Signed)
    Home exercise program created by Loren Vicens, PT.  For questions, please contact Pessy Delamar via phone at [phone] or email at [email or web address]   [facility] [place] [facility] [address]  [address] [place], [place], [address] Phone: [phone]   Fax:  [phone]    

## 2020-04-13 DIAGNOSIS — K7460 Unspecified cirrhosis of liver: Principal | ICD-10-CM

## 2020-04-15 DIAGNOSIS — E1142 Type 2 diabetes mellitus with diabetic polyneuropathy: Secondary | ICD-10-CM | POA: Diagnosis not present

## 2020-04-15 DIAGNOSIS — G4486 Cervicogenic headache: Secondary | ICD-10-CM | POA: Diagnosis not present

## 2020-04-19 ENCOUNTER — Institutional Professional Consult (permissible substitution): Payer: BC Managed Care – PPO | Admitting: Emergency Medicine

## 2020-04-19 NOTE — Telephone Encounter (Signed)
I dont see the medication he is requesting on his med list

## 2020-04-20 ENCOUNTER — Ambulatory Visit: Admit: 2020-04-20 | Discharge: 2020-04-21 | Payer: MEDICARE

## 2020-04-20 DIAGNOSIS — R9389 Abnormal findings on diagnostic imaging of other specified body structures: Principal | ICD-10-CM

## 2020-04-20 DIAGNOSIS — R918 Other nonspecific abnormal finding of lung field: Principal | ICD-10-CM

## 2020-04-20 DIAGNOSIS — R0600 Dyspnea, unspecified: Principal | ICD-10-CM

## 2020-04-21 ENCOUNTER — Ambulatory Visit: Payer: BC Managed Care – PPO

## 2020-04-22 ENCOUNTER — Other Ambulatory Visit: Payer: Self-pay

## 2020-04-22 ENCOUNTER — Encounter: Payer: Self-pay | Admitting: Physical Therapy

## 2020-04-22 ENCOUNTER — Ambulatory Visit: Payer: BC Managed Care – PPO | Admitting: Physical Therapy

## 2020-04-22 DIAGNOSIS — M542 Cervicalgia: Secondary | ICD-10-CM

## 2020-04-22 DIAGNOSIS — R29898 Other symptoms and signs involving the musculoskeletal system: Secondary | ICD-10-CM | POA: Diagnosis not present

## 2020-04-22 DIAGNOSIS — R293 Abnormal posture: Secondary | ICD-10-CM

## 2020-04-22 DIAGNOSIS — M5481 Occipital neuralgia: Secondary | ICD-10-CM | POA: Diagnosis not present

## 2020-04-22 NOTE — Therapy (Signed)
[facility] [address]  [place], [place], [address] Phone: [phone]   Fax:  [phone]  Physical Therapy Treatment  Patient Details  Name: Angel [name] MRN: [id number] Date of Birth: 1955/07/28 Referring Provider (PT): [name], [place]   Encounter Date: 04/22/2020   PT End of Session - 04/22/20 1406    Visit Number 2    Number of Visits 12    Date for PT Re-Evaluation 05/24/20    Authorization Type Medicare, [employer] & [employer]    PT Start Time 1406    PT Stop Time 1447    PT Time Calculation (min) 41 min    Activity Tolerance Patient tolerated treatment well    Behavior During Therapy [facility] for tasks assessed/performed           Past Medical History:  Diagnosis Date   Bipolar disorder ([place])    Cirrhosis of liver without mention of alcohol    Depression    GERD (gastroesophageal reflux disease)    Headache    Hepatitis B    Hepatitis E. antigen positive by history, status post treatment with Hepsera and baraclude    Hepatitis B carrier ([place])    Personal history of colonic polyps    Adenomas polyp 2008 and 2010, 2015    Weight loss    Pt. has lost 10 lbs since pre-visit, intentionally with diet and walking    Past Surgical History:  Procedure Laterality Date   APPENDECTOMY     COLONOSCOPY     POLYPECTOMY     UPPER GASTROINTESTINAL ENDOSCOPY      There were no vitals filed for this visit.   Subjective Assessment - 04/22/20 1421    Subjective Pt reports he has only attempted HEP 1x since eval, but denies any problems.    Patient Stated Goals "to have pain go down in intensity and have more movement in neck so it's easier to drive"    Currently in Pain? Yes    Pain Score 2     Pain Location Neck    Pain Orientation Right;Left;Mid;Lateral    Pain Descriptors / Indicators Constant    Pain Type Chronic pain    Pain Onset --   ~4 months   Pain Frequency Constant                              [facility] Adult PT Treatment/Exercise - 04/22/20 1406      Exercises   Exercises Neck      Neck Exercises: Machines for Strengthening   UBE (Upper Arm Bike) L2.0 x 6 min (3' fwd/3' back)      Manual Therapy   Manual Therapy Soft tissue mobilization;Myofascial release;Passive ROM    Manual therapy comments skilled palpation and monitoring during DN    Soft tissue mobilization STM/DTM to B UT, LS (R>L) & scalenes (L>R); gentle STM to B cervical paraspinals (R>L)    Myofascial Release manual TPR to R>L UT, LS, scalenes and cervical paraspinals; pin & stretch to R UT/LS    Passive ROM manual B shoulder depression & retraction            Trigger Point Dry Needling - 04/22/20 1406    Consent Given? Yes    Education Handout Provided Previously provided    Muscles Treated Head and Neck Upper trapezius;Levator scapulae;Scalenes;Splenius capitus   Bilateral   Upper Trapezius Response Twitch reponse elicited;Palpable  increased muscle length    Levator Scapulae Response Twitch response elicited;Palpable increased muscle length    Scalenes Response Twitch reponse elicited;Palpable increased muscle length    Splenius capitus Response Twitch reponse elicited;Palpable increased muscle length                PT Education - 04/22/20 1428    Education Details review of DN rational, procedures, outcomes, potential side effects, and recommended post-treatment exercises/activity    Person(s) Educated Patient    Methods Explanation   pt declined handout - has had DN previously   Comprehension Verbalized understanding            PT Short Term Goals - 04/22/20 1447      PT SHORT TERM GOAL #1   Title Patient to be independent with initial HEP.    Status On-going    Target Date 05/03/20      PT SHORT TERM GOAL #2   Title Patient will verbalize/demonstrate good awareness of neutral spine posture and proper body mechanics for daily tasks including computer  usage and driving.    Status On-going    Target Date 05/03/20             PT Long Term Goals - 04/22/20 1609      PT LONG TERM GOAL #1   Title Patient will be independent with ongoing/advanced HEP for self-management at home    Status On-going    Target Date 05/24/20      PT LONG TERM GOAL #2   Title Improve posture and alignment with patient to demonstrate improved upright posture with posterior shoulder girdle engaged    Status On-going    Target Date 05/24/20      PT LONG TERM GOAL #3   Title Patient to improve cervical AROM to [facility] without pain provocation to allow for increased ease of driving.    Status On-going    Target Date 05/24/20      PT LONG TERM GOAL #4   Title Decrease neck pain and stiffness by 50-75% allowing patient increased ease of turning head while driving    Status On-going    Target Date 05/24/20      PT LONG TERM GOAL #5   Title Patient will report ability to drive in grocery store parking lot w/o limitation due to neck pain or stiffness    Status On-going    Target Date 05/24/20                 Plan - 04/22/20 1423    Clinical Impression Statement Javion reports very limited compliance with HEP as he has been busy trying to sell his house - HEP only attempted once since eval, but pt denies any issues/concerns with HEP. He requested DN today for neck muscles having noted benefit from DN in prior PT episodes. After review of DN rational, procedures, outcomes and potential side effects, patient verbalized consent to DN treatment in conjunction with manual STM/DTM and TPR to reduce ttp/muscle tension. Muscles treated include B UT, LS, scalenes and cervical paraspinals. DN produced normal response with good twitches elicited resulting in palpable reduction in pain/ttp and muscle tension with pt noting increased ease of neck motion. Pt educated to expect mild to moderate muscle soreness for up to 24-48 hrs and instructed to continue prescribed home  exercise program and current activity level with pt verbalizing understanding of theses instructions.    Comorbidities lung nodules awaiting further diagnostic testing; bipolar disorder, hepatitis B, hepatic  cirrhosis, GERD, h/o falls    Rehab Potential Good    PT Frequency 2x / week    PT Duration 6 weeks    PT Treatment/Interventions ADLs/Self Care Home Management;Cryotherapy;Electrical Stimulation;Moist Heat;Traction;Ultrasound;Functional mobility training;Therapeutic activities;Therapeutic exercise;Balance training;Neuromuscular re-education;Patient/family education;Manual techniques;Passive range of motion;Dry needling;Taping;Spinal Manipulations;Joint Manipulations    PT Next Visit Plan Review initial HEP, brief review of posture and body mechanics; postural stretching and strengthening; manual therapy including DN as indicated for increased muscle tension    Consulted and Agree with Plan of Care Patient           Patient will benefit from skilled therapeutic intervention in order to improve the following deficits and impairments:  Decreased activity tolerance,Decreased balance,Decreased endurance,Decreased knowledge of precautions,Decreased mobility,Decreased range of motion,Decreased strength,Difficulty walking,Increased fascial restricitons,Increased muscle spasms,Impaired perceived functional ability,Impaired flexibility,Impaired UE functional use,Improper body mechanics,Postural dysfunction,Pain  Visit Diagnosis: Cervicalgia  Abnormal posture  Other symptoms and signs involving the musculoskeletal system  Occipital neuralgia of left side     Problem List Patient Active Problem List   Diagnosis Date Noted   Hypertriglyceridemia 02/11/2020   Diabetes mellitus without complication ([place]) 16/02/9603   Type 2 diabetes mellitus with diabetic polyneuropathy, without long-term current use of insulin ([place]) 02/11/2020   Diabetes 1.5, managed as type 1 ([place]) 01/28/2020    Cervicogenic headache 12/09/2019   Occipital neuralgia of left side 04/10/2019   Epidural abscess 05/09/2017   Hyperglycemia 10/07/2013   Hepatic cirrhosis ([place]) 10/03/2013   Bipolar I disorder, most recent episode depressed ([place]) 05/21/2013   Hepatitis B 03/21/2013   Esophageal reflux 03/21/2013   Personal history of colonic polyps 03/21/2013   Bipolar disorder, unspecified ([place]) 09/03/2011    Percival Spanish, PT, MPT 04/22/2020, 4:10 PM  [facility] [address]  [place], [place], [address] Phone: [phone]   Fax:  [phone]  Name: Huel Centola MRN: [id number] Date of Birth: 03/08/1956

## 2020-04-24 DIAGNOSIS — Z79899 Other long term (current) drug therapy: Secondary | ICD-10-CM | POA: Diagnosis not present

## 2020-04-24 DIAGNOSIS — F319 Bipolar disorder, unspecified: Secondary | ICD-10-CM | POA: Diagnosis not present

## 2020-04-24 DIAGNOSIS — G4700 Insomnia, unspecified: Secondary | ICD-10-CM | POA: Diagnosis not present

## 2020-05-01 ENCOUNTER — Other Ambulatory Visit: Payer: Self-pay | Admitting: Medical

## 2020-05-03 ENCOUNTER — Ambulatory Visit: Payer: BC Managed Care – PPO | Admitting: Physical Therapy

## 2020-05-04 DIAGNOSIS — I8510 Secondary esophageal varices without bleeding: Principal | ICD-10-CM

## 2020-05-04 DIAGNOSIS — K7469 Other cirrhosis of liver: Principal | ICD-10-CM

## 2020-05-04 DIAGNOSIS — K766 Portal hypertension: Principal | ICD-10-CM

## 2020-05-04 DIAGNOSIS — B181 Chronic viral hepatitis B without delta-agent: Principal | ICD-10-CM

## 2020-05-06 ENCOUNTER — Other Ambulatory Visit: Payer: Self-pay

## 2020-05-06 ENCOUNTER — Ambulatory Visit: Payer: BC Managed Care – PPO

## 2020-05-06 DIAGNOSIS — M542 Cervicalgia: Secondary | ICD-10-CM | POA: Diagnosis not present

## 2020-05-06 DIAGNOSIS — R293 Abnormal posture: Secondary | ICD-10-CM

## 2020-05-06 DIAGNOSIS — M5481 Occipital neuralgia: Secondary | ICD-10-CM | POA: Diagnosis not present

## 2020-05-06 DIAGNOSIS — R29898 Other symptoms and signs involving the musculoskeletal system: Secondary | ICD-10-CM

## 2020-05-06 NOTE — Therapy (Signed)
[facility] [address]  [place], [place], [address] Phone: [phone]   Fax:  [phone]  Physical Therapy Treatment  Patient Details  Name: Angel [name] MRN: [id number] Date of Birth: 05-21-1955 Referring Provider (PT): [name], [place]   Encounter Date: 05/06/2020   PT End of Session - 05/06/20 1405    Visit Number 3    Number of Visits 12    Date for PT Re-Evaluation 05/24/20    Authorization Type Medicare, [employer] & [employer]    PT Start Time 6073    PT Stop Time 1443    PT Time Calculation (min) 45 min    Activity Tolerance Patient tolerated treatment well    Behavior During Therapy [facility] for tasks assessed/performed           Past Medical History:  Diagnosis Date  . Bipolar disorder ([place])   . 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 Depression   . GERD (gastroesophageal reflux disease)   . Headache   . Hepatitis B    Hepatitis E. antigen positive by history, status post treatment with Hepsera and baraclude   . Hepatitis B carrier ([place])   . Personal history of colonic polyps    Adenomas polyp 2008 and 2010, 2015   . Weight loss    Pt. has lost 10 lbs since pre-visit, intentionally with diet and walking    Past Surgical History:  Procedure Laterality Date  . APPENDECTOMY    . COLONOSCOPY    . POLYPECTOMY    . UPPER GASTROINTESTINAL ENDOSCOPY      There were no vitals filed for this visit.   Subjective Assessment - 05/06/20 1404    Subjective Pt. doing ok.  Pt. noting DN has been helping him.    Patient Stated Goals "to have pain go down in intensity and have more movement in neck so it's easier to drive"    Currently in Pain? Yes    Pain Score 2     Pain Location Neck    Pain Orientation Right;Left;Mid    Pain Descriptors / Indicators Constant    Pain Type Chronic pain    Multiple Pain Sites No                             [facility] Adult PT  Treatment/Exercise - 05/06/20 0001      Self-Care   Self-Care Posture    Posture reviewed proper desk position with computer to reduce cervical strain      Neck Exercises: Machines for Strengthening   UBE (Upper Arm Bike) L2.0 x 6 min (3' fwd/3' back)      Neck Exercises: Seated   Neck Retraction 10 reps;5 secs   Heavy cueing for retraction technique   Other Seated Exercise Scapular retraction + depression 10 x 5"    cues for scap. depression     Neck Exercises: Stretches   Upper Trapezius Stretch Right;Left;30 seconds;1 rep    Upper Trapezius Stretch Limitations hands anchored on chair     Levator Stretch Right;Left;30 seconds;1 rep    Levator Stretch Limitations cues to keep hand behind/under buttock to maintain neutral shoulder    Corner Stretch 30 seconds;3 reps                  PT Education - 05/06/20 1525    Education Details Instruction in proper desk setup to reduce  cervical strain    Person(s) Educated Patient    Methods Explanation;Demonstration;Verbal cues;Handout    Comprehension Verbalized understanding;Returned demonstration;Verbal cues required            PT Short Term Goals - 05/06/20 1410      PT SHORT TERM GOAL #1   Title Patient to be independent with initial HEP.    Status Achieved    Target Date 05/03/20      PT SHORT TERM GOAL #2   Title Patient will verbalize/demonstrate good awareness of neutral spine posture and proper body mechanics for daily tasks including computer usage and driving.    Status Achieved    Target Date 05/03/20             PT Long Term Goals - 04/22/20 1609      PT LONG TERM GOAL #1   Title Patient will be independent with ongoing/advanced HEP for self-management at home    Status On-going    Target Date 05/24/20      PT LONG TERM GOAL #2   Title Improve posture and alignment with patient to demonstrate improved upright posture with posterior shoulder girdle engaged    Status On-going    Target Date 05/24/20       PT LONG TERM GOAL #3   Title Patient to improve cervical AROM to [facility] without pain provocation to allow for increased ease of driving.    Status On-going    Target Date 05/24/20      PT LONG TERM GOAL #4   Title Decrease neck pain and stiffness by 50-75% allowing patient increased ease of turning head while driving    Status On-going    Target Date 05/24/20      PT LONG TERM GOAL #5   Title Patient will report ability to drive in grocery store parking lot w/o limitation due to neck pain or stiffness    Status On-going    Target Date 05/24/20                 Plan - 05/06/20 1406    Clinical Impression Statement Pt. reporting he has performed HEP once since issue last session.  Reviewed HEP with pt. today with mod cueing required for proper posture with cervical/doorway stretching along with proper motion with chin tuck activity.  Instructed pt. in proper desk positioning to reduce cervical strain as pt. still sitting in "armchair" at desktop computer.  Ended session with handout issued to pt. for desk positioning.    Comorbidities lung nodules awaiting further diagnostic testing; bipolar disorder, hepatitis B, hepatic cirrhosis, GERD, h/o falls    Rehab Potential Good    PT Frequency 2x / week    PT Duration 6 weeks    PT Treatment/Interventions ADLs/Self Care Home Management;Cryotherapy;Electrical Stimulation;Moist Heat;Traction;Ultrasound;Functional mobility training;Therapeutic activities;Therapeutic exercise;Balance training;Neuromuscular re-education;Patient/family education;Manual techniques;Passive range of motion;Dry needling;Taping;Spinal Manipulations;Joint Manipulations    PT Next Visit Plan Postural stretching and strengthening; manual therapy including DN as indicated for increased muscle tension    Consulted and Agree with Plan of Care Patient           Patient will benefit from skilled therapeutic intervention in order to improve the following deficits and  impairments:  Decreased activity tolerance,Decreased balance,Decreased endurance,Decreased knowledge of precautions,Decreased mobility,Decreased range of motion,Decreased strength,Difficulty walking,Increased fascial restricitons,Increased muscle spasms,Impaired perceived functional ability,Impaired flexibility,Impaired UE functional use,Improper body mechanics,Postural dysfunction,Pain  Visit Diagnosis: Cervicalgia  Abnormal posture  Other symptoms and signs involving the musculoskeletal system  Occipital neuralgia  of left side     Problem List Patient Active Problem List   Diagnosis Date Noted  . Hypertriglyceridemia 02/11/2020  . Diabetes mellitus without complication (HCC) 02/11/2020  . Type 2 diabetes mellitus with diabetic polyneuropathy, without long-term current use of insulin (HCC) 02/11/2020  . Diabetes 1.5, managed as type 1 (HCC) 01/28/2020  . Cervicogenic headache 12/09/2019  . Occipital neuralgia of left side 04/10/2019  . 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 Hyperglycemia 10/07/2013  . Hepatic cirrhosis (HCC) 10/03/2013  . Bipolar I disorder, most recent episode depressed (HCC) 05/21/2013  . Hepatitis B 03/21/2013  . Esophageal reflux 03/21/2013  . Personal history of colonic polyps 03/21/2013  . Bipolar disorder, unspecified (HCC) 09/03/2011    Kermit Balo, PTA 05/06/20 5:15 PM   [facility] [address]  [address] [place], [place], [address] Phone: [phone]   Fax:  [phone]  Name: Angel [name] MRN: [id number] Date of Birth: April 09, 1956

## 2020-05-07 ENCOUNTER — Encounter: Payer: Self-pay | Admitting: Medical

## 2020-05-10 ENCOUNTER — Telehealth: Payer: Self-pay | Admitting: Medical

## 2020-05-10 ENCOUNTER — Ambulatory Visit: Payer: BC Managed Care – PPO | Admitting: Specialist

## 2020-05-10 ENCOUNTER — Ambulatory Visit: Payer: BC Managed Care – PPO

## 2020-05-10 DIAGNOSIS — Z13820 Encounter for screening for osteoporosis: Secondary | ICD-10-CM

## 2020-05-10 NOTE — Telephone Encounter (Signed)
Placed dexa scan order.

## 2020-05-10 NOTE — Telephone Encounter (Signed)
I put in dexa scan order.

## 2020-05-11 ENCOUNTER — Ambulatory Visit: Payer: BC Managed Care – PPO | Admitting: Internal Medicine

## 2020-05-13 ENCOUNTER — Ambulatory Visit: Payer: BC Managed Care – PPO | Attending: Medical | Admitting: Physical Therapy

## 2020-05-13 ENCOUNTER — Other Ambulatory Visit: Payer: Self-pay

## 2020-05-13 ENCOUNTER — Encounter: Payer: Self-pay | Admitting: Physical Therapy

## 2020-05-13 DIAGNOSIS — R29898 Other symptoms and signs involving the musculoskeletal system: Secondary | ICD-10-CM | POA: Diagnosis not present

## 2020-05-13 DIAGNOSIS — M5481 Occipital neuralgia: Secondary | ICD-10-CM | POA: Insufficient documentation

## 2020-05-13 DIAGNOSIS — M542 Cervicalgia: Secondary | ICD-10-CM | POA: Diagnosis not present

## 2020-05-13 DIAGNOSIS — R293 Abnormal posture: Secondary | ICD-10-CM | POA: Diagnosis not present

## 2020-05-13 MED ORDER — NADOLOL 20 MG TABLET
ORAL_TABLET | Freq: Every day | ORAL | 1 refills | 90 days | Status: CP
Start: 2020-05-13 — End: 2020-11-09

## 2020-05-13 NOTE — Therapy (Signed)
[facility] [address]  [place], [place], [address] Phone: [phone]   Fax:  [phone]  Physical Therapy Treatment  Patient Details  Name: Angel [name] MRN: [id number] Date of Birth: 10-Nov-1955 Referring Provider (PT): [name], [place]   Encounter Date: 05/13/2020   PT End of Session - 05/13/20 1355    Visit Number 4    Number of Visits 12    Date for PT Re-Evaluation 05/24/20    Authorization Type Medicare, [employer] & [employer]    PT Start Time [id number]    PT Stop Time 1439    PT Time Calculation (min) 44 min    Activity Tolerance Patient tolerated treatment well    Behavior During Therapy [facility] for tasks assessed/performed           Past Medical History:  Diagnosis Date  . Bipolar disorder ([place])   . 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 Depression   . GERD (gastroesophageal reflux disease)   . Headache   . Hepatitis B    Hepatitis E. antigen positive by history, status post treatment with Hepsera and baraclude   . Hepatitis B carrier ([place])   . Personal history of colonic polyps    Adenomas polyp 2008 and 2010, 2015   . Weight loss    Pt. has lost 10 lbs since pre-visit, intentionally with diet and walking    Past Surgical History:  Procedure Laterality Date  . APPENDECTOMY    . COLONOSCOPY    . POLYPECTOMY    . UPPER GASTROINTESTINAL ENDOSCOPY      There were no vitals filed for this visit.   Subjective Assessment - 05/13/20 1359    Subjective Pt reports he fell asleep on the sofa this morning and his neck is stiff now.    Patient Stated Goals "to have pain go down in intensity and have more movement in neck so it's easier to drive"    Currently in Pain? Yes    Pain Score 3     Pain Location Neck    Pain Orientation Lower;Right    Pain Descriptors / Indicators Constant    Pain Type Chronic pain    Pain Frequency Constant                             [facility]  Adult PT Treatment/Exercise - 05/13/20 1355      Exercises   Exercises Neck      Neck Exercises: Machines for Strengthening   UBE (Upper Arm Bike) L2.5 x 6 min (3' fwd/3' back)      Neck Exercises: Theraband   Shoulder External Rotation 10 reps;Red    Shoulder External Rotation Limitations standing with back along doorframe to promote scap retraction    Horizontal ABduction 10 reps;Red    Horizontal ABduction Limitations standing with back along doorframe to promote scap retraction    Other Theraband Exercises Rec TB alt UE diagonals 10 x 3-5"; standing with back along doorframe to promote scap retraction      Manual Therapy   Manual Therapy Soft tissue mobilization;Myofascial release    Manual therapy comments skilled palpation and monitoring during DN    Soft tissue mobilization STM/DTM to B UT, LS (R>L) & scalenes (L>R); gentle STM to B cervical paraspinals (R>L)    Myofascial Release manual TPR to R>L UT, R LS, scalenes; pin & stretch to R UT/LS  Neck Exercises: Stretches   Upper Trapezius Stretch Right;Left;30 seconds;1 rep    Upper Trapezius Stretch Limitations hands anchored on chair     Levator Stretch Right;Left;30 seconds;1 rep    Levator Stretch Limitations cues to keep hand behind/under buttock to maintain neutral shoulder    Corner Stretch 30 seconds;3 reps    Corner Stretch Limitations 3-way doorway stretch - pt noting more of shoulder stretch than chest stretch with mid position (uncomfortable, therefore deferred from HEP with instructions only for low & high positions)            Trigger Point Dry Needling - 05/13/20 1355    Consent Given? Yes    Muscles Treated Head and Neck Upper trapezius;Levator scapulae;Scalenes    Upper Trapezius Response Twitch reponse elicited;Palpable increased muscle length   Bilateral   Levator Scapulae Response Twitch response elicited;Palpable increased muscle length   Rt   Scalenes Response Twitch reponse elicited;Palpable  increased muscle length   Rt                 PT Short Term Goals - 05/13/20 1404      PT SHORT TERM GOAL #1   Title Patient to be independent with initial HEP.    Status Achieved   05/06/20     PT SHORT TERM GOAL #2   Title Patient will verbalize/demonstrate good awareness of neutral spine posture and proper body mechanics for daily tasks including computer usage and driving.    Status Achieved   05/06/20            PT Long Term Goals - 04/22/20 1609      PT LONG TERM GOAL #1   Title Patient will be independent with ongoing/advanced HEP for self-management at home    Status On-going    Target Date 05/24/20      PT LONG TERM GOAL #2   Title Improve posture and alignment with patient to demonstrate improved upright posture with posterior shoulder girdle engaged    Status On-going    Target Date 05/24/20      PT LONG TERM GOAL #3   Title Patient to improve cervical AROM to [facility] without pain provocation to allow for increased ease of driving.    Status On-going    Target Date 05/24/20      PT LONG TERM GOAL #4   Title Decrease neck pain and stiffness by 50-75% allowing patient increased ease of turning head while driving    Status On-going    Target Date 05/24/20      PT LONG TERM GOAL #5   Title Patient will report ability to drive in grocery store parking lot w/o limitation due to neck pain or stiffness    Status On-going    Target Date 05/24/20                 Plan - 05/13/20 1440    Clinical Impression Statement Angel [name] reports increased pain/stiffness after falling asleep on the sofa watching TV this morning. Addressed muscle tension/tightness with MT and DN with pt reporting pain down to 0/10 afterward. Remainder of session focusing review of postural stretching and strengthening to further promote normalization of muscle tension with pt able to perform good return demonstration.    Comorbidities lung nodules awaiting further diagnostic testing; bipolar  disorder, hepatitis B, hepatic cirrhosis, GERD, h/o falls    Rehab Potential Good    PT Frequency 2x / week    PT Duration 6 weeks  PT Treatment/Interventions ADLs/Self Care Home Management;Cryotherapy;Electrical Stimulation;Moist Heat;Traction;Ultrasound;Functional mobility training;Therapeutic activities;Therapeutic exercise;Balance training;Neuromuscular re-education;Patient/family education;Manual techniques;Passive range of motion;Dry needling;Taping;Spinal Manipulations;Joint Manipulations    PT Next Visit Plan Postural stretching and strengthening; manual therapy including DN as indicated for increased muscle tension    Consulted and Agree with Plan of Care Patient           Patient will benefit from skilled therapeutic intervention in order to improve the following deficits and impairments:  Decreased activity tolerance,Decreased balance,Decreased endurance,Decreased knowledge of precautions,Decreased mobility,Decreased range of motion,Decreased strength,Difficulty walking,Increased fascial restricitons,Increased muscle spasms,Impaired perceived functional ability,Impaired flexibility,Impaired UE functional use,Improper body mechanics,Postural dysfunction,Pain  Visit Diagnosis: Cervicalgia  Abnormal posture  Other symptoms and signs involving the musculoskeletal system     Problem List Patient Active Problem List   Diagnosis Date Noted  . Hypertriglyceridemia 02/11/2020  . Diabetes mellitus without complication ([place]) [id number]  . Type 2 diabetes mellitus with diabetic polyneuropathy, without long-term current use of insulin ([place]) 02/11/2020  . Diabetes 1.5, managed as type 1 ([place]) 01/28/2020  . Cervicogenic headache 12/09/2019  . Occipital neuralgia of left side 04/10/2019  . 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 Hyperglycemia 10/07/2013  . Hepatic cirrhosis ([place]) 10/03/2013  . Bipolar I disorder, most recent episode depressed ([place]) 05/21/2013  . Hepatitis B 03/21/2013  .  Esophageal reflux 03/21/2013  . Personal history of colonic polyps 03/21/2013  . Bipolar disorder, unspecified ([place]) 09/03/2011    Percival Spanish ,PT, MPT 05/13/2020, 2:43 PM  [facility] [address]  [place], [place], [address] Phone: [phone]   Fax:  [phone]  Name: Angel [name] MRN: [id number] Date of Birth: Apr 26, 1956

## 2020-05-14 ENCOUNTER — Ambulatory Visit: Payer: BC Managed Care – PPO | Admitting: Physical Therapy

## 2020-05-14 ENCOUNTER — Ambulatory Visit (HOSPITAL_BASED_OUTPATIENT_CLINIC_OR_DEPARTMENT_OTHER)
Admission: RE | Admit: 2020-05-14 | Discharge: 2020-05-14 | Disposition: A | Discharge status: Home / Self Care | Payer: BC Managed Care – PPO | Source: Ambulatory Visit | Attending: Medical | Admitting: Medical

## 2020-05-14 DIAGNOSIS — K7460 Unspecified cirrhosis of liver: Secondary | ICD-10-CM | POA: Insufficient documentation

## 2020-05-14 DIAGNOSIS — Z13820 Encounter for screening for osteoporosis: Secondary | ICD-10-CM | POA: Diagnosis not present

## 2020-05-14 DIAGNOSIS — E119 Type 2 diabetes mellitus without complications: Secondary | ICD-10-CM | POA: Diagnosis not present

## 2020-05-14 DIAGNOSIS — M859 Disorder of bone density and structure, unspecified: Secondary | ICD-10-CM | POA: Diagnosis not present

## 2020-05-17 ENCOUNTER — Ambulatory Visit: Admit: 2020-05-17 | Discharge: 2020-05-18 | Payer: MEDICARE

## 2020-05-17 DIAGNOSIS — R0601 Orthopnea: Principal | ICD-10-CM

## 2020-05-17 DIAGNOSIS — R0600 Dyspnea, unspecified: Principal | ICD-10-CM

## 2020-05-18 ENCOUNTER — Encounter: Payer: Self-pay | Admitting: Physical Therapy

## 2020-05-18 ENCOUNTER — Other Ambulatory Visit: Payer: Self-pay

## 2020-05-18 ENCOUNTER — Ambulatory Visit: Payer: BC Managed Care – PPO | Admitting: Physical Therapy

## 2020-05-18 DIAGNOSIS — R293 Abnormal posture: Secondary | ICD-10-CM | POA: Diagnosis not present

## 2020-05-18 DIAGNOSIS — M5481 Occipital neuralgia: Secondary | ICD-10-CM | POA: Diagnosis not present

## 2020-05-18 DIAGNOSIS — M542 Cervicalgia: Secondary | ICD-10-CM | POA: Diagnosis not present

## 2020-05-18 DIAGNOSIS — R29898 Other symptoms and signs involving the musculoskeletal system: Secondary | ICD-10-CM

## 2020-05-18 NOTE — Therapy (Signed)
[facility] [address]  [place], [place], [address] Phone: [phone]   Fax:  [phone]  Physical Therapy Treatment  Patient Details  Name: Angel [name] MRN: [id number] Date of Birth: May 26, 1955 Referring Provider (PT): [name], [place]   Encounter Date: 05/18/2020   PT End of Session - 05/18/20 1015    Visit Number 5    Number of Visits 12    Date for PT Re-Evaluation 05/24/20    Authorization Type Medicare, [employer] & [employer]    PT Start Time 1015    PT Stop Time 1100    PT Time Calculation (min) 45 min    Activity Tolerance Patient tolerated treatment well    Behavior During Therapy [facility] for tasks assessed/performed           Past Medical History:  Diagnosis Date  . Bipolar disorder ([place])   . 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 Depression   . GERD (gastroesophageal reflux disease)   . Headache   . Hepatitis B    Hepatitis E. antigen positive by history, status post treatment with Hepsera and baraclude   . Hepatitis B carrier ([place])   . Personal history of colonic polyps    Adenomas polyp 2008 and 2010, 2015   . Weight loss    Pt. has lost 10 lbs since pre-visit, intentionally with diet and walking    Past Surgical History:  Procedure Laterality Date  . APPENDECTOMY    . COLONOSCOPY    . POLYPECTOMY    . UPPER GASTROINTESTINAL ENDOSCOPY      There were no vitals filed for this visit.   Subjective Assessment - 05/18/20 1018    Subjective Pt reports good relief from DN last session which seems to have lasted.    Patient Stated Goals "to have pain go down in intensity and have more movement in neck so it's easier to drive"    Currently in Pain? No/denies              [facility] PT Assessment - 05/18/20 1015      AROM   Cervical Flexion 51 - pulling    Cervical Extension 40 - tight    Cervical - Right Side Bend 30    Cervical - Left Side Bend 24    Cervical - Right  Rotation 44    Cervical - Left Rotation 50                         [facility] Adult PT Treatment/Exercise - 05/18/20 1015      Neck Exercises: Machines for Strengthening   UBE (Upper Arm Bike) L3.0 x 6 min (3' fwd/3' back)                  PT Education - 05/18/20 1100    Education Details HEP update - cervical extension & rotation SNAGs; red TB scap retraction + horiz ABD, UE diagonals & ER    Person(s) Educated Patient    Methods Explanation;Demonstration;Verbal cues;Handout    Comprehension Verbalized understanding;Verbal cues required;Returned demonstration;Need further instruction            PT Short Term Goals - 05/13/20 1404      PT SHORT TERM GOAL #1   Title Patient to be independent with initial HEP.    Status Achieved   05/06/20     PT SHORT TERM GOAL #2   Title  Patient will verbalize/demonstrate good awareness of neutral spine posture and proper body mechanics for daily tasks including computer usage and driving.    Status Achieved   05/06/20            PT Long Term Goals - 05/18/20 1023      PT LONG TERM GOAL #1   Title Patient will be independent with ongoing/advanced HEP for self-management at home    Status Partially Met    Target Date 05/24/20      PT LONG TERM GOAL #2   Title Improve posture and alignment with patient to demonstrate improved upright posture with posterior shoulder girdle engaged    Status Partially Met    Target Date 05/24/20      PT LONG TERM GOAL #3   Title Patient to improve cervical AROM to [facility] without pain provocation to allow for increased ease of driving.    Status On-going    Target Date 05/24/20      PT LONG TERM GOAL #4   Title Decrease neck pain and stiffness by 50-75% allowing patient increased ease of turning head while driving    Status Achieved   05/18/20 - pt reports 70% improvement     PT LONG TERM GOAL #5   Title Patient will report ability to drive in grocery store parking lot w/o limitation  due to neck pain or stiffness    Status On-going    Target Date 05/24/20                 Plan - 05/18/20 1100    Clinical Impression Statement Angel [name] reports no pain since DN last session and ~70% improvement in pain since start of PT. He continues to be inconsistent with postural awareness, therefore reviewed ideal desk/sitting posture including recommended style of chair +/- lumbar support. He reports no issues with current HEP and was able to tolerate progression of scapular retraction exercises to upright position w/o problems. HEP updated to include exercise progression as well as adding cervical rotation SNAGs to address his remaining tightness noted with rotation. Pt able to provide good return demonstration with all exercises. Anticipate Angel [name] should be ready to transition to HEP as of next visit following final goal assessment and HEP review as needed, with possible 30-day hold as indicated.    Comorbidities lung nodules awaiting further diagnostic testing; bipolar disorder, hepatitis B, hepatic cirrhosis, GERD, h/o falls    Rehab Potential Good    PT Frequency 2x / week    PT Duration 6 weeks    PT Treatment/Interventions ADLs/Self Care Home Management;Cryotherapy;Electrical Stimulation;Moist Heat;Traction;Ultrasound;Functional mobility training;Therapeutic activities;Therapeutic exercise;Balance training;Neuromuscular re-education;Patient/family education;Manual techniques;Passive range of motion;Dry needling;Taping;Spinal Manipulations;Joint Manipulations    PT Next Visit Plan Goal assessment with anticipated transition to HEP +/- 30-day hold    Consulted and Agree with Plan of Care Patient           Patient will benefit from skilled therapeutic intervention in order to improve the following deficits and impairments:  Decreased activity tolerance,Decreased balance,Decreased endurance,Decreased knowledge of precautions,Decreased mobility,Decreased range of motion,Decreased  strength,Difficulty walking,Increased fascial restricitons,Increased muscle spasms,Impaired perceived functional ability,Impaired flexibility,Impaired UE functional use,Improper body mechanics,Postural dysfunction,Pain  Visit Diagnosis: Cervicalgia  Abnormal posture  Other symptoms and signs involving the musculoskeletal system  Occipital neuralgia of left side     Problem List Patient Active Problem List   Diagnosis Date Noted  . Hypertriglyceridemia 02/11/2020  . Diabetes mellitus without complication ([place]) 64/15/8309  . Type 2 diabetes mellitus with  diabetic polyneuropathy, without long-term current use of insulin ([place]) 02/11/2020  . Diabetes 1.5, managed as type 1 ([place]) 01/28/2020  . Cervicogenic headache 12/09/2019  . Occipital neuralgia of left side 04/10/2019  . 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 Hyperglycemia 10/07/2013  . Hepatic cirrhosis ([place]) 10/03/2013  . Bipolar I disorder, most recent episode depressed ([place]) 05/21/2013  . Hepatitis B 03/21/2013  . Esophageal reflux 03/21/2013  . Personal history of colonic polyps 03/21/2013  . Bipolar disorder, unspecified ([place]) 09/03/2011    Percival Spanish, PT, MPT 05/18/2020, 12:12 PM  [facility] [address]  [place], [place], [address] Phone: [phone]   Fax:  [phone]  Name: Danie Diehl MRN: [id number] Date of Birth: 01/29/1956

## 2020-05-18 NOTE — Patient Instructions (Signed)
    Home exercise program created by JoAnne Kreis, PT.  For questions, please contact JoAnne via phone at [phone] or email at [email or web address]  [facility] [address]  [address] [place], , [address] Phone: [phone]   Fax:  [phone]    

## 2020-05-20 ENCOUNTER — Ambulatory Visit: Payer: Medicare Other | Admitting: Dietician

## 2020-05-21 ENCOUNTER — Encounter: Payer: Self-pay | Admitting: Physical Therapy

## 2020-05-21 ENCOUNTER — Other Ambulatory Visit: Payer: Self-pay

## 2020-05-21 ENCOUNTER — Ambulatory Visit: Payer: BC Managed Care – PPO | Admitting: Physical Therapy

## 2020-05-21 DIAGNOSIS — M5481 Occipital neuralgia: Secondary | ICD-10-CM | POA: Diagnosis not present

## 2020-05-21 DIAGNOSIS — R293 Abnormal posture: Secondary | ICD-10-CM

## 2020-05-21 DIAGNOSIS — M542 Cervicalgia: Secondary | ICD-10-CM | POA: Diagnosis not present

## 2020-05-21 DIAGNOSIS — R29898 Other symptoms and signs involving the musculoskeletal system: Secondary | ICD-10-CM | POA: Diagnosis not present

## 2020-05-21 NOTE — Therapy (Signed)
[facility] [address]  [place], [place], [address] Phone: [phone]   Fax:  [phone]  Physical Therapy Treatment / Discharge Summary  Patient Details  Name: Angel [name] MRN: [id number] Date of Birth: [birth year] Referring Provider (PT): [name], [place]   Encounter Date: 05/21/2020   PT End of Session - 05/21/20 0930    Visit Number 6    Number of Visits 12    Date for PT Re-Evaluation 05/24/20    Authorization Type Medicare, [employer] & [employer]    PT Start Time 0930    PT Stop Time 1014    PT Time Calculation (min) 44 min    Activity Tolerance Patient tolerated treatment well    Behavior During Therapy [facility] for tasks assessed/performed           Past Medical History:  Diagnosis Date  . Bipolar disorder ([place])   . 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 Depression   . GERD (gastroesophageal reflux disease)   . Headache   . Hepatitis B    Hepatitis E. antigen positive by history, status post treatment with Hepsera and baraclude   . Hepatitis B carrier ([place])   . Personal history of colonic polyps    Adenomas polyp 2008 and 2010, 2015   . Weight loss    Pt. has lost 10 lbs since pre-visit, intentionally with diet and walking    Past Surgical History:  Procedure Laterality Date  . APPENDECTOMY    . COLONOSCOPY    . POLYPECTOMY    . UPPER GASTROINTESTINAL ENDOSCOPY      There were no vitals filed for this visit.   Subjective Assessment - 05/21/20 0936    Subjective Pt reports HEP is going well, but requests breathing exercises to help with his endurance due to his collapsed lung (identified as possibility on chest CT from Nov)..    Patient Stated Goals "to have pain go down in intensity and have more movement in neck so it's easier to drive"    Currently in Pain? No/denies              [facility] PT Assessment - 05/21/20 0930      Assessment   Medical Diagnosis Neck pain     Referring Provider (PT) [name], PA-C    Next MD Visit none scheduled      Observation/Other Assessments   Focus on Therapeutic Outcomes (FOTO)  Neck - 63% (37% limitation)      AROM   Cervical Flexion 52    Cervical Extension 46    Cervical - Right Side Bend 34    Cervical - Left Side Bend 24    Cervical - Right Rotation 55    Cervical - Left Rotation 48                         [facility] Adult PT Treatment/Exercise - 05/21/20 0930      Exercises   Other Exercises  Provided instruction and demonstration in basal expansion/rib cage and diaphragmatic breathing exercises utilizing pursed lip breathing, as well as verbal instruction in use of an incentive spirometer at pt request.      Neck Exercises: Machines for Strengthening   UBE (Upper Arm Bike) L3.0 x 8 min (4' fwd/4' back)                  PT Education - 05/21/20 1013  Education Details Final HEP review + instruction in breathing exercises - [employer] Accesss Code: [email or web address]    Person(s) Educated Patient    Methods Explanation;Demonstration;Verbal cues;Handout    Comprehension Verbalized understanding;Verbal cues required;Returned demonstration            PT Short Term Goals - 05/13/20 1404      PT SHORT TERM GOAL #1   Title Patient to be independent with initial HEP.    Status Achieved   05/06/20     PT SHORT TERM GOAL #2   Title Patient will verbalize/demonstrate good awareness of neutral spine posture and proper body mechanics for daily tasks including computer usage and driving.    Status Achieved   05/06/20            PT Long Term Goals - 05/21/20 0957      PT LONG TERM GOAL #1   Title Patient will be independent with ongoing/advanced HEP for self-management at home    Status Achieved   05/21/20     PT LONG TERM GOAL #2   Title Improve posture and alignment with patient to demonstrate improved upright posture with posterior shoulder girdle engaged    Status Achieved   05/21/20      PT LONG TERM GOAL #3   Title Patient to improve cervical AROM to [facility] without pain provocation to allow for increased ease of driving.    Status Achieved   05/21/20     PT LONG TERM GOAL #4   Title Decrease neck pain and stiffness by 50-75% allowing patient increased ease of turning head while driving    Status Achieved   05/18/20 - pt reports 70% improvement     PT LONG TERM GOAL #5   Title Patient will report ability to drive in grocery store parking lot w/o limitation due to neck pain or stiffness    Status Achieved   05/21/20                Plan - 05/21/20 1014    Clinical Impression Statement Angel [name] remains pain free today and reports >70% improvement in pain and neck tightness since start of PT. His cervical ROM is now essentially at his baseline from prior PT episodes and [facility] for his normal activities including being able to turn his head adequately to maneuver in/out of parking spaces while at the grocery store. All goals now met with FOTO predictions exceeded and Angel [name] ready to transition to his HEP at this time, however he did inquire about breathing exercises to help with walking pneumonia and collapsed lung reportedly identified on CT scan back in Nov as he states he notes he gets more winded with activity especially climbing stairs - provided instruction and demonstration in basal expansion/rib cage and diaphragmatic breathing exercises as well as verbal instruction in use of an incentive spirometer at pt request with HEP instructions provided. As all goals related to neck pain episode now met, will proceed with discharge from PT for this episode.    Comorbidities lung nodules awaiting further diagnostic testing; bipolar disorder, hepatitis B, hepatic cirrhosis, GERD, h/o falls    Rehab Potential Good    PT Treatment/Interventions ADLs/Self Care Home Management;Cryotherapy;Electrical Stimulation;Moist Heat;Traction;Ultrasound;Functional mobility training;Therapeutic  activities;Therapeutic exercise;Balance training;Neuromuscular re-education;Patient/family education;Manual techniques;Passive range of motion;Dry needling;Taping;Spinal Manipulations;Joint Manipulations    PT Next Visit Plan Discharge with transition to HEP    Consulted and Agree with Plan of Care Patient           Patient will  benefit from skilled therapeutic intervention in order to improve the following deficits and impairments:  Decreased activity tolerance,Decreased balance,Decreased endurance,Decreased knowledge of precautions,Decreased mobility,Decreased range of motion,Decreased strength,Difficulty walking,Increased fascial restricitons,Increased muscle spasms,Impaired perceived functional ability,Impaired flexibility,Impaired UE functional use,Improper body mechanics,Postural dysfunction,Pain  Visit Diagnosis: Cervicalgia  Abnormal posture  Other symptoms and signs involving the musculoskeletal system     Problem List Patient Active Problem List   Diagnosis Date Noted  . Hypertriglyceridemia 02/11/2020  . Diabetes mellitus without complication ([employer]) 97/74/1423  . Type 2 diabetes mellitus with diabetic polyneuropathy, without long-term current use of insulin ([place]) 02/11/2020  . Diabetes 1.5, managed as type 1 ([place]) 01/28/2020  . Cervicogenic headache 12/09/2019  . Occipital neuralgia of left side 04/10/2019  . 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 Hyperglycemia 10/07/2013  . Hepatic cirrhosis ([place]) 10/03/2013  . Bipolar I disorder, most recent episode depressed ([place]) 05/21/2013  . Hepatitis B 03/21/2013  . Esophageal reflux 03/21/2013  . Personal history of colonic polyps 03/21/2013  . Bipolar disorder, unspecified ([place]) 09/03/2011     PHYSICAL THERAPY DISCHARGE SUMMARY  Visits from Start of Care: 6  Current functional level related to goals / functional outcomes:   Refer to above clinical impression and goal assessment.   Remaining deficits:   As above.    Education / Equipment:   Cervical HEP; instruction in breathing exercises at pt request  Plan: Patient agrees to discharge.  Patient goals were met. Patient is being discharged due to meeting the stated rehab goals.  ?????      Percival Spanish, PT, MPT 05/21/2020, 12:18 PM  [facility] [address]  [place], [place], [address] Phone: [phone]   Fax:  [phone]  Name: Angel [name] MRN: [id number] Date of Birth: 1956-02-12

## 2020-06-01 ENCOUNTER — Ambulatory Visit: Payer: BC Managed Care – PPO | Admitting: Internal Medicine

## 2020-06-02 DIAGNOSIS — F319 Bipolar disorder, unspecified: Secondary | ICD-10-CM | POA: Diagnosis not present

## 2020-06-02 DIAGNOSIS — Z79899 Other long term (current) drug therapy: Secondary | ICD-10-CM | POA: Diagnosis not present

## 2020-06-02 DIAGNOSIS — G4700 Insomnia, unspecified: Secondary | ICD-10-CM | POA: Diagnosis not present

## 2020-06-02 DIAGNOSIS — G2401 Drug induced subacute dyskinesia: Secondary | ICD-10-CM | POA: Diagnosis not present

## 2020-06-22 ENCOUNTER — Ambulatory Visit: Admit: 2020-06-22 | Discharge: 2020-06-22 | Payer: MEDICARE

## 2020-06-22 DIAGNOSIS — R918 Other nonspecific abnormal finding of lung field: Principal | ICD-10-CM

## 2020-06-23 DIAGNOSIS — I8510 Secondary esophageal varices without bleeding: Secondary | ICD-10-CM | POA: Diagnosis not present

## 2020-06-23 DIAGNOSIS — K8681 Exocrine pancreatic insufficiency: Secondary | ICD-10-CM | POA: Diagnosis not present

## 2020-06-23 DIAGNOSIS — K7469 Other cirrhosis of liver: Secondary | ICD-10-CM | POA: Diagnosis not present

## 2020-06-23 DIAGNOSIS — K529 Noninfective gastroenteritis and colitis, unspecified: Secondary | ICD-10-CM | POA: Diagnosis not present

## 2020-06-29 DIAGNOSIS — G4700 Insomnia, unspecified: Secondary | ICD-10-CM | POA: Diagnosis not present

## 2020-06-29 DIAGNOSIS — F319 Bipolar disorder, unspecified: Secondary | ICD-10-CM | POA: Diagnosis not present

## 2020-07-14 ENCOUNTER — Encounter: Payer: Self-pay | Admitting: Medical

## 2020-07-15 DIAGNOSIS — R140 Abdominal distension (gaseous): Secondary | ICD-10-CM | POA: Diagnosis not present

## 2020-07-15 DIAGNOSIS — R197 Diarrhea, unspecified: Secondary | ICD-10-CM | POA: Diagnosis not present

## 2020-07-26 DIAGNOSIS — E119 Type 2 diabetes mellitus without complications: Secondary | ICD-10-CM | POA: Diagnosis not present

## 2020-07-26 DIAGNOSIS — G4700 Insomnia, unspecified: Secondary | ICD-10-CM | POA: Diagnosis not present

## 2020-07-26 DIAGNOSIS — B180 Chronic viral hepatitis B with delta-agent: Secondary | ICD-10-CM | POA: Diagnosis not present

## 2020-07-26 DIAGNOSIS — F319 Bipolar disorder, unspecified: Secondary | ICD-10-CM | POA: Diagnosis not present

## 2020-07-27 ENCOUNTER — Telehealth: Payer: Self-pay | Admitting: Medical

## 2020-07-27 NOTE — Telephone Encounter (Signed)
Forms faxed in to front office  Placed into [place] folder up front

## 2020-08-02 ENCOUNTER — Telehealth: Payer: Self-pay | Admitting: Medical

## 2020-08-02 NOTE — Telephone Encounter (Signed)
I just got form last week for fmla for spouse to be off for [place] [facility] conditions. I have not seen Angel [name] in 4 months. Will you get him scheduled for follow up. Angel [name] attending appointment would be helpful. Can you get him scheduled this week. I won't be in town next seek. So get scheduled this week. Can fill out form on that day. Need up date on present condition.

## 2020-08-03 ENCOUNTER — Telehealth (INDEPENDENT_AMBULATORY_CARE_PROVIDER_SITE_OTHER): Payer: BC Managed Care – PPO | Admitting: Medical

## 2020-08-03 ENCOUNTER — Other Ambulatory Visit: Payer: Self-pay

## 2020-08-03 ENCOUNTER — Telehealth: Payer: Self-pay | Admitting: Medical

## 2020-08-03 DIAGNOSIS — R197 Diarrhea, unspecified: Secondary | ICD-10-CM

## 2020-08-03 DIAGNOSIS — E1142 Type 2 diabetes mellitus with diabetic polyneuropathy: Secondary | ICD-10-CM | POA: Diagnosis not present

## 2020-08-03 DIAGNOSIS — B1910 Unspecified viral hepatitis B without hepatic coma: Secondary | ICD-10-CM | POA: Diagnosis not present

## 2020-08-03 DIAGNOSIS — F3130 Bipolar disorder, current episode depressed, mild or moderate severity, unspecified: Secondary | ICD-10-CM | POA: Diagnosis not present

## 2020-08-03 DIAGNOSIS — Z0279 Encounter for issue of other medical certificate: Secondary | ICD-10-CM

## 2020-08-03 DIAGNOSIS — R600 Localized edema: Secondary | ICD-10-CM

## 2020-08-03 NOTE — Patient Instructions (Addendum)
For bipolar with more depressive feature per description recommend follow up with your psychiatrist office. With your complicated med regimen would defer to psychiatrist. If mood worsens or change let us know. If severe change such as thought of harm to self or other then be seen in ED.   For hx of hep b, recurrent diarrhea and pancreatic insufficiency follow up with GI MD. During interim get stools studies later in week when you come in for future labs. If your are running out or lomotil and gi MD has not responded to refill request let me know.  For diabetes history putting in future P3X with metabolic panel.  For pedal edema and wt gain placed future bnp and cxr.  Will fill out form so [name] can be off to help with medical issues.  Follow up date to be determined after lab and xray review.

## 2020-08-03 NOTE — Telephone Encounter (Signed)
Spoke with patient via mychart.

## 2020-08-03 NOTE — Telephone Encounter (Signed)
Pt called and lvm to return call 

## 2020-08-03 NOTE — Telephone Encounter (Signed)
Leave of abscence forms filled out. Please fax. Placed on  Your desk. File/save for future please.

## 2020-08-03 NOTE — Progress Notes (Signed)
Subjective:    Patient ID: Jill Ruppe, male    DOB: 1955/06/10, 65 y.o.   MRN: [id number]  HPI  Virtual Visit via Video Note  I connected with [name] on 08/03/20 at  2:40 PM EDT by a video enabled telemedicine application and verified that I am speaking with the correct person using two identifiers.  Location: Patient: home Provider: office.     I discussed the limitations of evaluation and management by telemedicine and the availability of in person appointments. The patient expressed understanding and agreed to proceed.  History of Present Illness:   Pt has hx of depression/bipolar.  Pt states since death of his dog his mood worsened. Recently sold house and moved. So a lot of stress and he has been crying intermittent. [name] states he is not having mania. He appears more severely. Pt is on lithium 600 mg 1 in am and 2 tab at night. About to loose his psychiatrist and will need  Just recently also his diarrhea has flared. It has worsened recently. Pt has seen GI MD about 40 days ago. They are waiting on call back from GI MD. Pt also has been given Creon. Hx of some hep b. With move had trouble refilling his hep b med. Cost him $500 dollars. Filling med issues since changing pharmacies and address.  [name] states he has gained weight.   They just moved to [facility]  Pt has gained weight up to 260 lb. Pt states some legs swelling back in December. If he puts feet up at night feet will go down.     Observations/Objective:  General-no acute distress, pleasant, oriented. Lungs- on inspection lungs appear unlabored. Neck- no tracheal deviation or jvd on inspection. Neuro- gross motor function appears intact.   Assessment and Plan: For bipolar with more depressive feature per description recommend follow up with your psychiatrist office. With your complicated med regimen would defer to psychiatrist. If mood worsens or change let us know. If severe change such as  thought of harm to self or other then be seen in ED.   For hx of hep b, recurrent diarrhea and pancreatic insufficiency follow up with GI MD. During interim get stools studies later in week when you come in for future labs. If your are running out or lomotil and gi MD has not responded to refill request let me know.  For diabetes history putting in future C1E with metabolic panel.  For pedal edema and wt gain placed future bnp and cxr.  Will fill out form so [name] can be off to help with medical issues.  Follow up date to be determined after lab and xray review.  [name], PA-C   Time spent with patient today was  40+  minutes which consisted of chart review, discussing diagnosis, work up, treatment and documentation.   Follow Up Instructions:    I discussed the assessment and treatment plan with the patient. The patient was provided an opportunity to ask questions and all were answered. The patient agreed with the plan and demonstrated an understanding of the instructions.   The patient was advised to call back or seek an in-person evaluation if the symptoms worsen or if the condition fails to improve as anticipated.  Time spent with patient today was 30 minutes which consisted of chart revdiew, discussing diagnosis, work up treatment and documentation.   [name], PA-C   Review of Systems  Constitutional: Negative for chills, fatigue and fever.  HENT:  Negative for congestion and dental problem.   Respiratory: Negative for chest tightness, shortness of breath and wheezing.   Cardiovascular: Negative for chest pain and palpitations.  Gastrointestinal: Positive for diarrhea. Negative for abdominal pain, constipation, nausea, rectal pain and vomiting.  Genitourinary: Negative for dysuria, flank pain and frequency.  Musculoskeletal: Negative for back pain, myalgias and neck pain.  Skin: Negative for rash.  Neurological: Negative for dizziness, speech difficulty,  weakness, numbness and headaches.  Hematological: Negative for adenopathy. Does not bruise/bleed easily.  Psychiatric/Behavioral: Positive for dysphoric mood. Negative for behavioral problems, confusion, hallucinations and suicidal ideas. The patient is not nervous/anxious.        Objective:   Physical Exam        Assessment & Plan:

## 2020-08-04 NOTE — Telephone Encounter (Signed)
LOA form faxed. Copy was placed in [place] office for charge. Original forms left on Hannahs desk for scanning. -Jma

## 2020-08-06 ENCOUNTER — Encounter: Payer: Self-pay | Admitting: Medical

## 2020-08-06 ENCOUNTER — Telehealth: Payer: Self-pay | Admitting: Medical

## 2020-08-06 DIAGNOSIS — E1142 Type 2 diabetes mellitus with diabetic polyneuropathy: Secondary | ICD-10-CM

## 2020-08-06 MED ORDER — DIPHENOXYLATE-ATROPINE 2.5-0.025 MG PO TABS: 1.0000 | ORAL_TABLET | Freq: Four times a day (QID) | ORAL | 0 refills | Status: DC | PRN

## 2020-08-06 MED ORDER — DIPHENOXYLATE-ATROPINE 2.5-0.025 MG PO TABS: 1.0000 | ORAL_TABLET | Freq: Four times a day (QID) | ORAL | 0 refills | Status: AC | PRN

## 2020-08-06 NOTE — Telephone Encounter (Signed)
Lomotil sent to pharmacy.

## 2020-08-06 NOTE — Telephone Encounter (Signed)
Pharmacy loaded

## 2020-08-06 NOTE — Telephone Encounter (Signed)
Would you call and cancel out pt lomotil sent to [facility].

## 2020-08-06 NOTE — Telephone Encounter (Signed)
Called stayed on hold for 15 mins

## 2020-08-09 ENCOUNTER — Other Ambulatory Visit: Payer: Self-pay | Admitting: Medical

## 2020-08-09 DIAGNOSIS — E1142 Type 2 diabetes mellitus with diabetic polyneuropathy: Secondary | ICD-10-CM | POA: Diagnosis not present

## 2020-08-10 ENCOUNTER — Other Ambulatory Visit: Payer: Self-pay | Admitting: Family Medicine

## 2020-08-10 ENCOUNTER — Other Ambulatory Visit: Payer: Self-pay

## 2020-08-10 ENCOUNTER — Telehealth: Payer: Self-pay | Admitting: *Deleted

## 2020-08-10 ENCOUNTER — Ambulatory Visit (HOSPITAL_BASED_OUTPATIENT_CLINIC_OR_DEPARTMENT_OTHER)
Admission: RE | Admit: 2020-08-10 | Discharge: 2020-08-10 | Disposition: A | Discharge status: Home / Self Care | Payer: BC Managed Care – PPO | Source: Ambulatory Visit | Attending: Medical | Admitting: Medical

## 2020-08-10 DIAGNOSIS — R600 Localized edema: Secondary | ICD-10-CM | POA: Diagnosis not present

## 2020-08-10 DIAGNOSIS — R9389 Abnormal findings on diagnostic imaging of other specified body structures: Secondary | ICD-10-CM

## 2020-08-10 LAB — COMPREHENSIVE METABOLIC PANEL
ALT: 38 IU/L (ref 0–44)
AST: 27 IU/L (ref 0–40)
Albumin/Globulin Ratio: 1.7 (ref 1.2–2.2)
Albumin: 4.3 g/dL (ref 3.8–4.8)
Alkaline Phosphatase: 78 IU/L (ref 44–121)
BUN/Creatinine Ratio: 13 (ref 10–24)
BUN: 11 mg/dL (ref 8–27)
Bilirubin Total: 0.4 mg/dL (ref 0.0–1.2)
CO2: 23 mmol/L (ref 20–29)
Calcium: 9.4 mg/dL (ref 8.6–10.2)
Chloride: 105 mmol/L (ref 96–106)
Creatinine, Ser: 0.83 mg/dL (ref 0.76–1.27)
Globulin, Total: 2.6 g/dL (ref 1.5–4.5)
Glucose: 113 mg/dL — ABNORMAL HIGH (ref 65–99)
Potassium: 4.6 mmol/L (ref 3.5–5.2)
Sodium: 143 mmol/L (ref 134–144)
Total Protein: 6.9 g/dL (ref 6.0–8.5)
eGFR: 97 mL/min/{1.73_m2} (ref >59)

## 2020-08-10 LAB — HGB A1C W/O EAG: Hgb A1c MFr Bld: 8.4 % — ABNORMAL HIGH (ref 4.8–5.6)

## 2020-08-10 LAB — SPECIMEN STATUS REPORT

## 2020-08-10 MED ORDER — AZITHROMYCIN 250 MG PO TABS: ORAL_TABLET | ORAL | 0 refills | Status: DC

## 2020-08-10 NOTE — Telephone Encounter (Signed)
Spoke with patient.  He has been having some SOB lately.  Antibiotic sent in to [place], [place].  He did have a CT in February.  He will call and follow up with Pulmonologist at [facility].  CXR faxed over to [facility] Dr. Cornell Barman.  CT cancelled since he will be following up with pulmonologist.

## 2020-08-10 NOTE — Telephone Encounter (Signed)
Persistent pneumonia?   ----- call in z pak #1  as directed  Order CT and f/u with edward next week

## 2020-08-10 NOTE — Telephone Encounter (Signed)
[place]- called about a stat report on the CXR.    Report is in epic can you please review?

## 2020-08-11 ENCOUNTER — Encounter: Payer: Self-pay | Admitting: Medical

## 2020-08-11 NOTE — Telephone Encounter (Signed)
Will you call pt and find out when he is going to see pulmonologist. In light of this xray finding and his complaint would want his next visit with me to be in office.  Let me now when his appointment with pulmonologist is. I think they should see him relatively quickly?

## 2020-08-12 NOTE — Telephone Encounter (Signed)
Ok. Reviewed the note. Thanks for getting him scheduled for Monday. Should be in office visit.

## 2020-08-12 NOTE — Telephone Encounter (Signed)
He sent a mychart about what the pulmonologist said and I also got him set up to see you Monday.

## 2020-08-12 NOTE — Telephone Encounter (Addendum)
Left with [name] for patient to call back

## 2020-08-13 ENCOUNTER — Telehealth: Payer: Self-pay | Admitting: *Deleted

## 2020-08-13 DIAGNOSIS — R197 Diarrhea, unspecified: Secondary | ICD-10-CM | POA: Diagnosis not present

## 2020-08-13 NOTE — Addendum Note (Signed)
Addended by: Kem Boroughs D on: 08/13/2020 02:16 PM   Modules accepted: Orders

## 2020-08-13 NOTE — Telephone Encounter (Addendum)
We got a call from [facility] per Sherri and they stated that they spoke with someone earlier about needing lab orders for stool specimens and never got them.  I advised Sherri to let them know that I will reprint and send orders over.  Fax number was given, [phone]  Two of the test are usually sent to [facility] and one is usually sent to [facility].  I changed the [facility] one (o & p) and faxed all the orders over to [facility] at [phone]

## 2020-08-16 ENCOUNTER — Ambulatory Visit (HOSPITAL_BASED_OUTPATIENT_CLINIC_OR_DEPARTMENT_OTHER)
Admission: RE | Admit: 2020-08-16 | Discharge: 2020-08-16 | Disposition: A | Discharge status: Home / Self Care | Payer: BC Managed Care – PPO | Source: Ambulatory Visit | Attending: Medical | Admitting: Medical

## 2020-08-16 ENCOUNTER — Other Ambulatory Visit: Payer: Self-pay

## 2020-08-16 ENCOUNTER — Ambulatory Visit (INDEPENDENT_AMBULATORY_CARE_PROVIDER_SITE_OTHER): Payer: BC Managed Care – PPO | Admitting: Medical

## 2020-08-16 ENCOUNTER — Encounter: Payer: Self-pay | Admitting: Medical

## 2020-08-16 VITALS — BP 129/64 | HR 78 | Resp 22 | Ht 70.0 in | Wt 268.4 lb

## 2020-08-16 DIAGNOSIS — J189 Pneumonia, unspecified organism: Secondary | ICD-10-CM

## 2020-08-16 DIAGNOSIS — R600 Localized edema: Secondary | ICD-10-CM

## 2020-08-16 DIAGNOSIS — M7989 Other specified soft tissue disorders: Secondary | ICD-10-CM

## 2020-08-16 DIAGNOSIS — R0600 Dyspnea, unspecified: Secondary | ICD-10-CM | POA: Diagnosis not present

## 2020-08-16 DIAGNOSIS — R197 Diarrhea, unspecified: Secondary | ICD-10-CM

## 2020-08-16 LAB — BRAIN NATRIURETIC PEPTIDE: Brain Natriuretic Peptide: 12 pg/mL (ref <100)

## 2020-08-16 MED ORDER — DOXYCYCLINE HYCLATE 100 MG PO TABS: 100.0000 mg | ORAL_TABLET | Freq: Two times a day (BID) | ORAL | 0 refills | Status: DC

## 2020-08-16 NOTE — Patient Instructions (Addendum)
Hx of possible pneumonia based on chest  xray and recent symptoms. Will give additional 7 days of antibiotic but this time rx doxycycline. Then repeat chest xray in one week. Follow xray and then decide on repeating ct chest. In February ct showed nearly resolved right lower lobe consolidation with only residual linear atelectasis/scarring persisting. But if chest xray not clear then will repeat ct.  Some dyspnea recently. No dvt's, no cardiomegaly and no wheezing. Maybe deconditioning as pt has gained a lot of weight since last visit.  Hx of chronic diarrhea with recent flare. Follow up with GI MD. Lomotil rx filled by GI. Use probiotics while on antibiotics. Stool panel studies pending.  Follow up date to be determined after review of chest xray repeat in one week.

## 2020-08-16 NOTE — Progress Notes (Signed)
Subjective:    Patient ID: Angel [name], male    DOB: 13-Nov-1955, 65 y.o.   MRN: [id number]  HPI  Pt in for follow up.  Pt had recent azithromycin antibiotic after chest xray done. CXR done as work up for his pedal edema. Dr. Etter Sjogren called in zpack last week. Post antibiotic some subjective fever, dypsnea on exertion but no significant cough.   EXAM: CHEST - 2 VIEW  COMPARISON:  March 09, 2020 chest radiograph and chest CT March 25, 2020  FINDINGS: Subtle ill-defined opacity is noted in the periphery of the right mid lung. There is mild right base atelectasis. Lungs elsewhere are clear. Heart size and pulmonary vascularity are normal. No adenopathy. There is mild degenerative change in the thoracic spine.  IMPRESSION: Subtle ill-defined opacity in the periphery of the right mid lung. Prior opacity in this area noted. There may be scarring in this area. Recurrent or persistent degree of pneumonia in this area is possible. There is right base atelectasis. Lungs otherwise clear. Heart size normal.  Given the potential persistence of opacity in the periphery the right mid lung, correlation with noncontrast enhanced chest CT may be advisable to further assess.    06/22/2020 ct of chest from [facility] showed.  TECHNIQUE: A helical CT scan was obtained without IV contrast from the thoracic inlet through the hemidiaphragms. Images were reconstructed in the axial plane. Coronal and sagittal reformatted images of the chest were also provided for further evaluation of the lung parenchyma.   FINDINGS:   AIRWAYS, LUNGS, PLEURA: Clear central tracheobronchial tree. Markedly improved to nearly resolved right lower lobe consolidation with only residual linear atelectasis/scarring persisting (reference 2:67). Calcified granulomas No pleural effusion.   MEDIASTINUM: Normal heart size. No pericardial effusion. Normal caliber thoracic aorta. No mediastinal lymphadenopathy. Right hilar  lymph node is decreased now borderline.   IMAGED ABDOMEN: Nodular contour of the liver. Splenomegaly splenomegaly1   SOFT TISSUES: Unremarkable.   BONES: No aggressive osseous lesions. Mild multilevel discogenic degenerative changes.  Our office contacted pulmonologist office at [facility] and they advised to follow up with [place].     Chronic diarrhea. Stool studies pending. Pt has refill of his immodium from gi md.   Review of Systems  Constitutional: Negative for chills, fatigue and fever.  Respiratory: Positive for shortness of breath. Negative for cough, choking, chest tightness and wheezing.   Cardiovascular: Negative for chest pain and palpitations.  Gastrointestinal: Negative for abdominal pain, blood in stool, nausea and vomiting.  Musculoskeletal: Negative for back pain and neck pain.  Skin: Negative for rash.  Neurological: Negative for dizziness, seizures, syncope, weakness and headaches.  Hematological: Negative for adenopathy. Does not bruise/bleed easily.  Psychiatric/Behavioral: The patient is not nervous/anxious.     Past Medical History:  Diagnosis Date  . Bipolar disorder ([place])   . 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 Depression   . GERD (gastroesophageal reflux disease)   . Headache   . Hepatitis B    Hepatitis E. antigen positive by history, status post treatment with Hepsera and baraclude   . Hepatitis B carrier ([place])   . Personal history of colonic polyps    Adenomas polyp 2008 and 2010, 2015   . Weight loss    Pt. has lost 10 lbs since pre-visit, intentionally with diet and walking     Social History   Socioeconomic History  . Marital status: Single    Spouse name: [name]  . Number of children:  0  . Years of education: Not on file  . Highest education level: Master's degree (e.g., MA, MS, MEng, MEd, MSW, MBA)  Occupational History  . Occupation: retired    Fish farm manager: NOT EMPLOYED  Tobacco Use  . Smoking status: Former Smoker     Types: Cigarettes    Quit date: 05/08/2009    Years since quitting: 11.2  . Smokeless tobacco: Never Used  Vaping Use  . Vaping Use: Never used  Substance and Sexual Activity  . Alcohol use: No    Alcohol/week: 0.0 standard drinks  . Drug use: No  . Sexual activity: Not Currently  Other Topics Concern  . Not on file  Social History Narrative   Patient is right-handed. He is married to same sex partner. He gets little exercise.   Caffeine: 4 glasses of tea max/day   Social Determinants of Health   Financial Resource Strain: Not on file  Food Insecurity: Not on file  Transportation Needs: Not on file  Physical Activity: Not on file  Stress: Not on file  Social Connections: Not on file  Intimate Partner Violence: Not on file    Past Surgical History:  Procedure Laterality Date  . APPENDECTOMY    . COLONOSCOPY    . POLYPECTOMY    . UPPER GASTROINTESTINAL ENDOSCOPY      Family History  Problem Relation Age of Onset  . Heart disease Mother   . Diabetes Father   . Kidney failure Father   . Kidney disease Father   . Other Sister        GERD  . Colon cancer Neg Hx   . Rectal cancer Neg Hx   . Stomach cancer Neg Hx   . Esophageal cancer Neg Hx   . Headache Neg Hx   . Migraines Neg Hx     Allergies  Allergen Reactions  . Aripiprazole Other (See Comments)    Tardive Dyskinesia  . Invega [Paliperidone Er] Other (See Comments)    Tardive dyskinsia symptoms  . Quetiapine Diarrhea, Hives, Itching, Nausea And Vomiting, Nausea Only and Shortness Of Breath  . Abilify [Aripiprazole]   . Hycodan [Hydrocodone-Homatropine]   . Hydrocodone   . Lamictal [Lamotrigine] Rash  . Lexapro [Escitalopram Oxalate] Other (See Comments)    Doesn't Work    Current Outpatient Medications on File Prior to Visit  Medication Sig Dispense Refill  . Accu-Chek Softclix Lancets lancets Check sugar TID . Dx code:R73.9 300 each 1  . ALPRAZolam (XANAX) 0.25 MG tablet Take 0.25 mg by mouth at  bedtime.    . Blood Glucose Monitoring Suppl (ACCU-CHEK AVIVA PLUS) w/Device KIT Check sugar TID . Dx code:R73.9 1 kit 0  . diphenoxylate-atropine (LOMOTIL) 2.5-0.025 MG tablet Take 1 tablet by mouth 4 (four) times daily as needed for diarrhea or loose stools. 20 tablet 0  . Doxepin HCl 3 MG TABS Take 1 tablet by mouth at bedtime.    Marland Kitchen glucose blood test strip 1 each by Other route 2 (two) times daily. 100 each 12  . Lancet Devices (LANCING DEVICE) MISC 1 Device by Does not apply route as directed. 1 each 1  . lithium 600 MG capsule TAKE 1 CAPSULE BY MOUTH EVERY MORNING AND 2 CAPSULES EVERY NIGHT AT BEDTIME 90 capsule 5  . Melatonin 10 MG SUBL Place 10 mg under the tongue at bedtime.    . methocarbamol (ROBAXIN) 750 MG tablet Take 1 tablet (750 mg total) by mouth every 8 (eight) hours as needed for muscle  spasms. 30 tablet 0  . nadolol (CORGARD) 40 MG tablet TAKE 1 TABLET BY MOUTH EVERY DAY 30 tablet 0  . Pancrelipase, Lip-Prot-Amyl, (CREON PO) Take by mouth. 2400 units 8 times a day    . fluticasone (FLONASE) 50 MCG/ACT nasal spray Place 2 sprays into both nostrils daily. 16 g 1  . loratadine (CLARITIN) 10 MG tablet TAKE 1 TABLET BY MOUTH DAILY 30 tablet 0  . ondansetron (ZOFRAN ODT) 4 MG disintegrating tablet Take 1 tablet (4 mg total) by mouth every 8 (eight) hours as needed for nausea or vomiting. 20 tablet 0   No current facility-administered medications on file prior to visit.    BP 129/64   Pulse 78   Resp (!) 22   Ht 5' 10" (1.778 m)   Wt 268 lb 6.4 oz (121.7 kg)   SpO2 97%   BMI 38.51 kg/m       Objective:   Physical Exam  General Mental Status- Alert. General Appearance- Not in acute distress.   Skin General: Color- Normal Color. Moisture- Normal Moisture.  Neck Carotid Arteries- Normal color. Moisture- Normal Moisture. No carotid bruits. No JVD.  Chest and Lung Exam Auscultation: Breath Sounds:-Normal.  Cardiovascular Auscultation:Rythm- Regular. Murmurs &  Other Heart Sounds:Auscultation of the heart reveals- No Murmurs.  Abdomen Inspection:-Inspeection Normal. Palpation/Percussion:Note:No mass. Palpation and Percussion of the abdomen reveal- Non Tender, Non Distended + BS, no rebound or guarding.   Neurologic Cranial Nerve exam:- CN III-XII intact(No nystagmus), symmetric smile. Strength:- 5/5 equal and symmetric strength both upper and lower extremities.      Assessment & Plan:  Hx of possible pneumonia based on chest  xray and recent symptoms. Will give additional 7 days of antibiotic but this time rx doxycycline. Then repeat chest xray in one week. Follow xray and then decide on repeating ct chest. In February ct showed nearly resolved right lower lobe consolidation with only residual linear atelectasis/scarring persisting. But if chest xray not clear then will repeat ct.  Some dyspnea recently. No dvt's, no cardiomegaly and no wheezing. Maybe deconditioning as pt has gained a lot of weight since last visit.  Hx of chronic diarrhea. Follow up with GI MD. Lomotil rx filled by GI. Use probiotics while on antibiotics. Stool panel studies pending.  Follow up date to be determined after review of chest xray repeat in one week.  [name], PA-C

## 2020-08-17 LAB — CBC WITH DIFFERENTIAL/PLATELET
Basophils Absolute: 0 10*3/uL (ref 0.0–0.1)
Basophils Relative: 0.7 % (ref 0.0–3.0)
Eosinophils Absolute: 0.1 10*3/uL (ref 0.0–0.7)
Eosinophils Relative: 1.7 % (ref 0.0–5.0)
HCT: 40.9 % (ref 39.0–52.0)
Hemoglobin: 13.6 g/dL (ref 13.0–17.0)
Lymphocytes Relative: 24.3 % (ref 12.0–46.0)
Lymphs Abs: 1 10*3/uL (ref 0.7–4.0)
MCHC: 33.3 g/dL (ref 30.0–36.0)
MCV: 93.6 fl (ref 78.0–100.0)
Monocytes Absolute: 0.4 10*3/uL (ref 0.1–1.0)
Monocytes Relative: 10.5 % (ref 3.0–12.0)
Neutro Abs: 2.5 10*3/uL (ref 1.4–7.7)
Neutrophils Relative %: 62.8 % (ref 43.0–77.0)
Platelets: 75 10*3/uL — ABNORMAL LOW (ref 150.0–400.0)
RBC: 4.37 Mil/uL (ref 4.22–5.81)
RDW: 14.7 % (ref 11.5–15.5)
WBC: 4.1 10*3/uL (ref 4.0–10.5)

## 2020-08-17 LAB — STOOL CULTURE: E coli, Shiga toxin Assay: NEGATIVE

## 2020-08-17 LAB — COMPREHENSIVE METABOLIC PANEL
ALT: 39 U/L (ref 0–53)
AST: 21 U/L (ref 0–37)
Albumin: 3.9 g/dL (ref 3.5–5.2)
Alkaline Phosphatase: 76 U/L (ref 39–117)
BUN: 15 mg/dL (ref 6–23)
CO2: 28 mEq/L (ref 19–32)
Calcium: 9.5 mg/dL (ref 8.4–10.5)
Chloride: 105 mEq/L (ref 96–112)
Creatinine, Ser: 1.11 mg/dL (ref 0.40–1.50)
GFR: 69.71 mL/min (ref >60.00)
Glucose, Bld: 229 mg/dL — ABNORMAL HIGH (ref 70–99)
Potassium: 4.4 mEq/L (ref 3.5–5.1)
Sodium: 141 mEq/L (ref 135–145)
Total Bilirubin: 0.4 mg/dL (ref 0.2–1.2)
Total Protein: 6.6 g/dL (ref 6.0–8.3)

## 2020-08-17 LAB — OVA AND PARASITE EXAMINATION

## 2020-08-18 ENCOUNTER — Telehealth: Payer: Self-pay | Admitting: Medical

## 2020-08-18 NOTE — Telephone Encounter (Signed)
OpenedOp

## 2020-08-19 LAB — CLOSTRIDIUM DIFFICILE BY PCR: Toxigenic C. Difficile by PCR: NEGATIVE

## 2020-08-23 DIAGNOSIS — F319 Bipolar disorder, unspecified: Secondary | ICD-10-CM | POA: Diagnosis not present

## 2020-09-07 ENCOUNTER — Other Ambulatory Visit: Payer: Self-pay

## 2020-09-07 ENCOUNTER — Encounter: Payer: Self-pay | Admitting: Nurse Practitioner

## 2020-09-07 ENCOUNTER — Ambulatory Visit (INDEPENDENT_AMBULATORY_CARE_PROVIDER_SITE_OTHER): Payer: BC Managed Care – PPO | Admitting: Nurse Practitioner

## 2020-09-07 VITALS — BP 130/74 | HR 78 | Ht 70.0 in | Wt 270.0 lb

## 2020-09-07 DIAGNOSIS — E782 Mixed hyperlipidemia: Secondary | ICD-10-CM

## 2020-09-07 DIAGNOSIS — E1165 Type 2 diabetes mellitus with hyperglycemia: Secondary | ICD-10-CM | POA: Diagnosis not present

## 2020-09-07 NOTE — Patient Instructions (Signed)
Diabetes Mellitus and Nutrition, Adult When you have diabetes, or diabetes mellitus, it is very important to have healthy eating habits because your blood sugar (glucose) levels are greatly affected by what you eat and drink. Eating healthy foods in the right amounts, at about the same times every day, can help you:  Control your blood glucose.  Lower your risk of heart disease.  Improve your blood pressure.  Reach or maintain a healthy weight. What can affect my meal plan? Every person with diabetes is different, and each person has different needs for a meal plan. Your health care provider may recommend that you work with a dietitian to make a meal plan that is best for you. Your meal plan may vary depending on factors such as:  The calories you need.  The medicines you take.  Your weight.  Your blood glucose, blood pressure, and cholesterol levels.  Your activity level.  Other health conditions you have, such as heart or kidney disease. How do carbohydrates affect me? Carbohydrates, also called carbs, affect your blood glucose level more than any other type of food. Eating carbs naturally raises the amount of glucose in your blood. Carb counting is a method for keeping track of how many carbs you eat. Counting carbs is important to keep your blood glucose at a healthy level, especially if you use insulin or take certain oral diabetes medicines. It is important to know how many carbs you can safely have in each meal. This is different for every person. Your dietitian can help you calculate how many carbs you should have at each meal and for each snack. How does alcohol affect me? Alcohol can cause a sudden decrease in blood glucose (hypoglycemia), especially if you use insulin or take certain oral diabetes medicines. Hypoglycemia can be a life-threatening condition. Symptoms of hypoglycemia, such as sleepiness, dizziness, and confusion, are similar to symptoms of having too much  alcohol.  Do not drink alcohol if: ? Your health care provider tells you not to drink. ? You are pregnant, may be pregnant, or are planning to become pregnant.  If you drink alcohol: ? Do not drink on an empty stomach. ? Limit how much you use to:  0-1 drink a day for women.  0-2 drinks a day for men. ? Be aware of how much alcohol is in your drink. In the [place] one drink equals one 12 oz bottle of beer (355 mL), one 5 oz glass of wine (148 mL), or one 1 oz glass of hard liquor (44 mL). ? Keep yourself hydrated with water, diet soda, or unsweetened iced tea.  Keep in mind that regular soda, juice, and other mixers may contain a lot of sugar and must be counted as carbs. What are tips for following this plan? Reading food labels  Start by checking the serving size on the "Nutrition Facts" label of packaged foods and drinks. The amount of calories, carbs, fats, and other nutrients listed on the label is based on one serving of the item. Many items contain more than one serving per package.  Check the total grams (g) of carbs in one serving. You can calculate the number of servings of carbs in one serving by dividing the total carbs by 15. For example, if a food has 30 g of total carbs per serving, it would be equal to 2 servings of carbs.  Check the number of grams (g) of saturated fats and trans fats in one serving. Choose foods that have   a low amount or none of these fats.  Check the number of milligrams (mg) of salt (sodium) in one serving. Most people should limit total sodium intake to less than 2,300 mg per day.  Always check the nutrition information of foods labeled as "low-fat" or "nonfat." These foods may be higher in added sugar or refined carbs and should be avoided.  Talk to your dietitian to identify your daily goals for nutrients listed on the label. Shopping  Avoid buying canned, pre-made, or processed foods. These foods tend to be high in fat, sodium, and added  sugar.  Shop around the outside edge of the grocery store. This is where you will most often find fresh fruits and vegetables, bulk grains, fresh meats, and fresh dairy. Cooking  Use low-heat cooking methods, such as baking, instead of high-heat cooking methods like deep frying.  Cook using healthy oils, such as olive, canola, or sunflower oil.  Avoid cooking with butter, cream, or high-fat meats. Meal planning  Eat meals and snacks regularly, preferably at the same times every day. Avoid going long periods of time without eating.  Eat foods that are high in fiber, such as fresh fruits, vegetables, beans, and whole grains. Talk with your dietitian about how many servings of carbs you can eat at each meal.  Eat 4-6 oz (112-168 g) of lean protein each day, such as lean meat, chicken, fish, eggs, or tofu. One ounce (oz) of lean protein is equal to: ? 1 oz (28 g) of meat, chicken, or fish. ? 1 egg. ?  cup (62 g) of tofu.  Eat some foods each day that contain healthy fats, such as avocado, nuts, seeds, and fish.   What foods should I eat? Fruits Berries. Apples. Oranges. Peaches. Apricots. Plums. Grapes. Mango. Papaya. Pomegranate. Kiwi. Cherries. Vegetables Lettuce. Spinach. Leafy greens, including kale, chard, collard greens, and mustard greens. Beets. Cauliflower. Cabbage. Broccoli. Carrots. Green beans. Tomatoes. Peppers. Onions. Cucumbers. Brussels sprouts. Grains Whole grains, such as whole-wheat or whole-grain bread, crackers, tortillas, cereal, and pasta. Unsweetened oatmeal. Quinoa. Brown or wild rice. Meats and other proteins Seafood. Poultry without skin. Lean cuts of poultry and beef. Tofu. Nuts. Seeds. Dairy Low-fat or fat-free dairy products such as milk, yogurt, and cheese. The items listed above may not be a complete list of foods and beverages you can eat. Contact a dietitian for more information. What foods should I avoid? Fruits Fruits canned with  syrup. Vegetables Canned vegetables. Frozen vegetables with butter or cream sauce. Grains Refined white flour and flour products such as bread, pasta, snack foods, and cereals. Avoid all processed foods. Meats and other proteins Fatty cuts of meat. Poultry with skin. Breaded or fried meats. Processed meat. Avoid saturated fats. Dairy Full-fat yogurt, cheese, or milk. Beverages Sweetened drinks, such as soda or iced tea. The items listed above may not be a complete list of foods and beverages you should avoid. Contact a dietitian for more information. Questions to ask a health care provider  Do I need to meet with a diabetes educator?  Do I need to meet with a dietitian?  What number can I call if I have questions?  When are the best times to check my blood glucose? Where to find more information:  American Diabetes Association: diabetes.org  Academy of Nutrition and Dietetics: www.eatright.org  National Institute of Diabetes and Digestive and Kidney Diseases: www.niddk.nih.gov  Association of Diabetes Care and Education Specialists: www.diabeteseducator.org Summary  It is important to have healthy eating   habits because your blood sugar (glucose) levels are greatly affected by what you eat and drink.  A healthy meal plan will help you control your blood glucose and maintain a healthy lifestyle.  Your health care provider may recommend that you work with a dietitian to make a meal plan that is best for you.  Keep in mind that carbohydrates (carbs) and alcohol have immediate effects on your blood glucose levels. It is important to count carbs and to use alcohol carefully. This information is not intended to replace advice given to you by your health care provider. Make sure you discuss any questions you have with your health care provider. Document Revised: 04/01/2019 Document Reviewed: 04/01/2019 Elsevier Patient Education  2021 [employer]  

## 2020-09-07 NOTE — Progress Notes (Signed)
Endocrinology Consult Note       09/07/2020, 11:25 AM   Subjective:    Patient ID: Angel [name], male    DOB: Sep 23, 1955.  Angel [name] is being seen in consultation for management of currently uncontrolled symptomatic diabetes requested by  [name], PA-C.   Past Medical History:  Diagnosis Date  . Bipolar disorder ([place])   . 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 Depression   . GERD (gastroesophageal reflux disease)   . Headache   . Hepatitis B    Hepatitis E. antigen positive by history, status post treatment with Hepsera and baraclude   . 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 Personal history of colonic polyps    Adenomas polyp 2008 and 2010, 2015   . Weight loss    Pt. has lost 10 lbs since pre-visit, intentionally with diet and walking    Past Surgical History:  Procedure Laterality Date  . APPENDECTOMY    . COLONOSCOPY    . POLYPECTOMY    . UPPER GASTROINTESTINAL ENDOSCOPY      Social History   Socioeconomic History  . Marital status: Single    Spouse name: [name]  . Number of children: 0  . Years of education: Not on file  . Highest education level: Master's degree (e.g., MA, MS, MEng, MEd, MSW, MBA)  Occupational History  . Occupation: retired    Fish farm manager: NOT EMPLOYED  Tobacco Use  . Smoking status: Former Smoker    Types: Cigarettes    Quit date: 05/08/2009    Years since quitting: 11.3  . Smokeless tobacco: Never Used  Vaping Use  . Vaping Use: Never used  Substance and Sexual Activity  . Alcohol use: No    Alcohol/week: 0.0 standard drinks  . Drug use: No  . Sexual activity: Not Currently  Other Topics Concern  . Not on file  Social History Narrative   Patient is right-handed. He is married to same sex partner. He gets little exercise.   Caffeine: 4 glasses of tea max/day   Social Determinants of Health   Financial Resource Strain: Not on file  Food  Insecurity: Not on file  Transportation Needs: Not on file  Physical Activity: Not on file  Stress: Not on file  Social Connections: Not on file    Family History  Problem Relation Age of Onset  . Heart disease Mother   . Diabetes Father   . Kidney failure Father   . Kidney disease Father   . Other Sister        GERD  . Colon cancer Neg Hx   . Rectal cancer Neg Hx   . Stomach cancer Neg Hx   . Esophageal cancer Neg Hx   . Headache Neg Hx   . Migraines Neg Hx     Outpatient Encounter Medications as of 09/07/2020  Medication Sig  . Accu-Chek Softclix Lancets lancets Check sugar TID . Dx code:R73.9  . ALPRAZolam (XANAX) 0.25 MG tablet Take 0.25 mg by mouth at bedtime.  . Blood Glucose Monitoring Suppl (ACCU-CHEK AVIVA PLUS) w/Device KIT Check sugar TID . Dx code:R73.9  . diphenoxylate-atropine (LOMOTIL) 2.5-0.025 MG tablet Take 1  tablet by mouth 4 (four) times daily as needed for diarrhea or loose stools.  . Doxepin HCl 3 MG TABS Take 1 tablet by mouth at bedtime.  Marland Kitchen glucose blood test strip 1 each by Other route 2 (two) times daily.  Elmore Guise Devices (LANCING DEVICE) MISC 1 Device by Does not apply route as directed.  . lithium 600 MG capsule TAKE 1 CAPSULE BY MOUTH EVERY MORNING AND 2 CAPSULES EVERY NIGHT AT BEDTIME  . Melatonin 10 MG SUBL Place 10 mg under the tongue at bedtime.  . methocarbamol (ROBAXIN) 750 MG tablet Take 1 tablet (750 mg total) by mouth every 8 (eight) hours as needed for muscle spasms.  . nadolol (CORGARD) 40 MG tablet TAKE 1 TABLET BY MOUTH EVERY DAY (Patient taking differently: 20 mg daily.)  . ondansetron (ZOFRAN ODT) 4 MG disintegrating tablet Take 1 tablet (4 mg total) by mouth every 8 (eight) hours as needed for nausea or vomiting.  . Pancrelipase, Lip-Prot-Amyl, (CREON PO) Take by mouth. 2400 units 8 times a day  . [DISCONTINUED] doxycycline (VIBRA-TABS) 100 MG tablet Take 1 tablet (100 mg total) by mouth 2 (two) times daily.  . [DISCONTINUED]  fluticasone (FLONASE) 50 MCG/ACT nasal spray Place 2 sprays into both nostrils daily.  . [DISCONTINUED] loratadine (CLARITIN) 10 MG tablet TAKE 1 TABLET BY MOUTH DAILY   No facility-administered encounter medications on file as of 09/07/2020.    ALLERGIES: Allergies  Allergen Reactions  . Aripiprazole Other (See Comments)    Tardive Dyskinesia  . Invega [Paliperidone Er] Other (See Comments)    Tardive dyskinsia symptoms  . Quetiapine Diarrhea, Hives, Itching, Nausea And Vomiting, Nausea Only and Shortness Of Breath  . Abilify [Aripiprazole]   . Hycodan [Hydrocodone Bit-Homatrop Mbr]   . Hydrocodone   . Lamictal [Lamotrigine] Rash  . Lexapro [Escitalopram Oxalate] Other (See Comments)    Doesn't Work    VACCINATION STATUS: Immunization History  Administered Date(s) Administered  . Influenza Split 04/11/2011  . Influenza,inj,Quad PF,6+ Mos 04/09/2014, 01/19/2015, 03/27/2016, 02/27/2017, 03/14/2018, 01/02/2019, 02/26/2020  . Influenza,inj,quad, With Preservative 03/21/2013  . Influenza,trivalent, recombinat, inj, PF 04/11/2011  . Pneumococcal Polysaccharide-23 03/21/2013  . Tdap 10/07/2013  . Zoster 08/12/2015  . Zoster Recombinat (Shingrix) 01/02/2019, 03/04/2019    Diabetes He presents for his initial diabetic visit. He has type 2 diabetes mellitus. The initial diagnosis of diabetes was made 2 months (Diagnosed approx 2 months ago) ago. His disease course has been worsening. There are no hypoglycemic associated symptoms. Associated symptoms include blurred vision, fatigue, foot paresthesias, polydipsia and polyuria. Pertinent negatives for diabetes include no foot ulcerations and no weight loss. There are no hypoglycemic complications. Symptoms are stable. Diabetic complications include nephropathy and peripheral neuropathy. Risk factors for coronary artery disease include diabetes mellitus, dyslipidemia, family history, male sex, obesity and sedentary lifestyle. Current diabetic  treatment includes diet. He is compliant with treatment most of the time. His weight is increasing rapidly (per partner, has gained 30+ lbs since last year). He is following a generally healthy diet. Meal planning includes avoidance of concentrated sweets. He participates in exercise intermittently. (He presents today, accompanied by his partner, for his consultation, with no meter or logs to review.  His previsit A1c was 8.4%, increasing from previous reading of 6.7%.  He is not currently on any medications for his diabetes.  He does not routinely monitor glucose at home, has all of his testing supplies in a storage facility due to him being in temporary housing.  They  plan to move into new place of residence within the next 2 weeks.  He also admits to eating out most meals due to his housing issues as well.  He admits to drinking sugar free twist drinks as well as unsweet tea (sweetened with artifical sugars).  He routinely eats 3 meals per day and 1 snack.  He has a consultation with GI in the coming weeks to discuss his frequent, persistent diarrhea (which started in August of 2021).  He has also been on multiple rounds of antibiotics for dental abscesses, now with false upper teeth, and presumed pneumonia.  He denies any s/s of hypoglycemia.) An ACE inhibitor/angiotensin II receptor blocker is not being taken. He does not see a podiatrist.Eye exam is current.  Hyperlipidemia This is a chronic problem. The current episode started more than 1 year ago. The problem is uncontrolled. Recent lipid tests were reviewed and are variable. Exacerbating diseases include chronic renal disease, diabetes, liver disease and obesity. Factors aggravating his hyperlipidemia include beta blockers and fatty foods. He is currently on no antihyperlipidemic treatment. Compliance problems include adherence to exercise.  Risk factors for coronary artery disease include diabetes mellitus, dyslipidemia, family history, obesity, male sex  and a sedentary lifestyle.     Review of systems  Constitutional: + rapidly increasing body weight,  current Body mass index is 38.74 kg/m. , + fatigue, no subjective hyperthermia, no subjective hypothermia Eyes: no blurry vision, no xerophthalmia ENT: no sore throat, no nodules palpated in throat, no dysphagia/odynophagia, no hoarseness Cardiovascular: no chest pain, no shortness of breath, no palpitations, + leg swelling L>R Respiratory: no cough, no shortness of breath Gastrointestinal: no nausea/vomiting/, + persistent diarrhea (on creon for pancreatic exocrine insufficiency) Musculoskeletal: no muscle/joint aches Skin: no rashes, no hyperemia Neurological: no tremors, + numbness/tingling to BLE, no dizziness Psychiatric: no depression, no anxiety  Objective:     BP 130/74   Pulse 78   Ht $R'5\' 10"'OB$  (1.778 m)   Wt 270 lb (122.5 kg)   BMI 38.74 kg/m   Wt Readings from Last 3 Encounters:  09/07/20 270 lb (122.5 kg)  08/16/20 268 lb 6.4 oz (121.7 kg)  03/19/20 246 lb 6.4 oz (111.8 kg)     BP Readings from Last 3 Encounters:  09/07/20 130/74  08/16/20 129/64  03/19/20 (!) 113/40     Physical Exam- Limited  Constitutional:  Body mass index is 38.74 kg/m. , not in acute distress, normal state of mind Eyes:  EOMI, no exophthalmos Neck: Supple Cardiovascular: RRR, no murmers, rubs, or gallops, mild nonpitting edema to BLE (L>R) Respiratory: Adequate breathing efforts, no crackles, rales, rhonchi, or wheezing Musculoskeletal: no gross deformities, strength intact in all four extremities, no gross restriction of joint movements Skin:  no rashes, no hyperemia Neurological: no tremor with outstretched hands   Foot exam:   No rashes, ulcers, cuts, + calluses, + onychodystrophy.   Decreased pulses bilat.  Good sensation to 10 g monofilament bilat.   CMP ( most recent) CMP     Component Value Date/Time   NA 141 08/16/2020 1633   NA 143 08/09/2020 1551   K 4.4  08/16/2020 1633   CL 105 08/16/2020 1633   CO2 28 08/16/2020 1633   GLUCOSE 229 (H) 08/16/2020 1633   BUN 15 08/16/2020 1633   BUN 11 08/09/2020 1551   CREATININE 1.11 08/16/2020 1633   CREATININE 1.00 03/10/2020 1011   CALCIUM 9.5 08/16/2020 1633   PROT 6.6 08/16/2020 1633   PROT 6.9  08/09/2020 1551   ALBUMIN 3.9 08/16/2020 1633   ALBUMIN 4.3 08/09/2020 1551   AST 21 08/16/2020 1633   ALT 39 08/16/2020 1633   ALKPHOS 76 08/16/2020 1633   BILITOT 0.4 08/16/2020 1633   BILITOT 0.4 08/09/2020 1551   GFRNONAA 94 05/06/2019 1324   GFRNONAA 92 02/09/2017 1527   GFRAA 109 05/06/2019 1324   GFRAA 106 02/09/2017 1527     Diabetic Labs (most recent): Lab Results  Component Value Date   HGBA1C 8.4 (H) 08/09/2020   HGBA1C 6.7 (A) 02/10/2020   HGBA1C 6.3 02/28/2019     Lipid Panel ( most recent) Lipid Panel     Component Value Date/Time   CHOL 156 02/10/2020 1054   TRIG 258 (H) 02/10/2020 1054   HDL 29 (L) 02/10/2020 1054   CHOLHDL 5.4 (H) 02/10/2020 1054   VLDL 32.8 09/28/2016 1659   LDLCALC 93 02/10/2020 1054      Lab Results  Component Value Date   TSH 0.72 11/28/2019   TSH 3.160 05/06/2019   TSH 2.43 10/11/2017   TSH 3.955 04/22/2015   TSH 2.87 10/07/2013           Assessment & Plan:   1) Uncontrolled Type 2 Diabetes with hyperglycemia  He presents today, accompanied by his partner, for his consultation, with no meter or logs to review.  His previsit A1c was 8.4%, increasing from previous reading of 6.7%.  He is not currently on any medications for his diabetes.  He does not routinely monitor glucose at home, has all of his testing supplies in a storage facility due to him being in temporary housing.  They plan to move into new place of residence within the next 2 weeks.  He also admits to eating out most meals due to his housing issues as well.  He admits to drinking sugar free twist drinks as well as unsweet tea (sweetened with artifical sugars).  He routinely  eats 3 meals per day and 1 snack.  He has a consultation with GI in the coming weeks to discuss his frequent, persistent diarrhea (which started in August of 2021).  He has also been on multiple rounds of antibiotics for dental abscesses, now with false upper teeth, and presumed pneumonia.  He denies any s/s of hypoglycemia.  - Angel [name] has currently uncontrolled symptomatic type 2 DM since 65 years of age (recently diagnosed approx 2 months ago), with most recent A1c of 8.4 %.   -Recent labs reviewed.  - I had a long discussion with him about the progressive nature of diabetes and the pathology behind its complications. -his diabetes is complicated by peripheral neuropathy and mild CKD and he remains at a high risk for more acute and chronic complications which include CAD, CVA, and retinopathy. These are all discussed in detail with him.  - I have counseled him on diet  and weight management by adopting a carbohydrate restricted/protein rich diet. Patient is encouraged to switch to unprocessed or minimally processed complex starch and increased protein intake (animal or plant source), fruits, and vegetables. -  he is advised to stick to a routine mealtimes to eat 3 meals a day and avoid unnecessary snacks (to snack only to correct hypoglycemia).   - he acknowledges that there is a room for improvement in his food and drink choices. - Suggestion is made for him to avoid simple carbohydrates  from his diet including Cakes, Sweet Desserts, Ice Cream, Soda (diet and regular), Sweet Tea, Candies, Chips,  Cookies, Store Bought Juices, Alcohol in Excess of  1-2 drinks a day, Artificial Sweeteners, Coffee Creamer, and "Sugar-free" Products. This will help patient to have more stable blood glucose profile and potentially avoid unintended weight gain.  - he will be scheduled with Jearld Fenton, RDN, CDE for diabetes education.  - I have approached him with the following individualized plan to manage   his diabetes and patient agrees:   -He is not currently on any medications at this time.  We discussed the potential need to start basal insulin at next visit depending on his glucose trend over the next 2 weeks.  It is likely he may have an insulin production issue since he already has exocrine pancreatic enzyme problems.  Insulin may be his best choice for treatment.  He has consultation with his GI doctor next week and a follow up with his dentist tomorrow. It is a possibility that ongoing infections could be contributing to his high glucose readings and vice versa.  -he is encouraged to start monitoring glucose 4 times daily, before meals and before bed, to log their readings on the clinic sheets provided, and bring them to review at follow up appointment in 2 weeks.  Sample meter provided from office today.  We worked together to ensure understanding on how to use device and lancets properly.  - Specific targets for  A1c;  LDL, HDL,  and Triglycerides were discussed with the patient.  2) Blood Pressure /Hypertension:  his blood pressure is controlled to target.   he is advised to continue his current medications including Nadolol 20 mg p.o. daily with breakfast.  3) Lipids/Hyperlipidemia:    Review of his recent lipid panel from 02/10/20 showed controlled  LDL at 93 and elevated triglycerides of 258.  he is not currently on any lipid lowering medications.  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   4)  Weight/Diet:  his Body mass index is 38.74 kg/m.  -  clearly complicating his diabetes care.   he is a candidate for weight loss. I discussed with him the fact that loss of 5 - 10% of his  current body weight will have the most impact on his diabetes management.  Exercise, and detailed carbohydrates information provided  -  detailed on discharge instructions.  5) Chronic Care/Health Maintenance: -he not on ACEI/ARB or Statin medications and is encouraged to initiate  and continue to follow up with Ophthalmology, Dentist, Podiatrist at least yearly or according to recommendations, and advised to stay away from smoking. I have recommended yearly flu vaccine and pneumonia vaccine at least every 5 years; moderate intensity exercise for up to 150 minutes weekly; and sleep for at least 7 hours a day.  - he is advised to maintain close follow up with Sagu[place], Percell Miller, PA-C for primary care needs, as well as his other providers for optimal and coordinated care.   - Time spent in this patient care: 60 min, of which > 50% was spent in counseling him about his diabetes and the rest reviewing his blood glucose logs, discussing his hypoglycemia and hyperglycemia episodes, reviewing his current and previous labs/studies (including abstraction from other facilities) and medications doses and developing a long term treatment plan based on the latest standards of care/guidelines; and documenting his care.    Please refer to Patient Instructions for Blood Glucose Monitoring and Insulin/Medications Dosing Guide" in media tab for additional information. Please also refer to "Patient Self Inventory" in the Media tab for reviewed  elements of pertinent patient history.  [name] participated in the discussions, expressed understanding, and voiced agreement with the above plans.  All questions were answered to his satisfaction. he is encouraged to contact clinic should he have any questions or concerns prior to his return visit.   Follow up plan: - Return in about 2 weeks (around 09/21/2020) for Diabetes F/U, Bring meter and logs, ABI next visit.  Rayetta Pigg, [facility] [address] [place], [place] [address] Phone: [phone] Fax: [phone]  09/07/2020, 11:25 AM

## 2020-09-09 ENCOUNTER — Encounter: Payer: Self-pay | Admitting: Medical

## 2020-09-15 DIAGNOSIS — K529 Noninfective gastroenteritis and colitis, unspecified: Secondary | ICD-10-CM | POA: Diagnosis not present

## 2020-09-15 DIAGNOSIS — K8681 Exocrine pancreatic insufficiency: Secondary | ICD-10-CM | POA: Diagnosis not present

## 2020-09-21 ENCOUNTER — Ambulatory Visit: Payer: Medicare HMO | Admitting: Nurse Practitioner

## 2020-09-24 ENCOUNTER — Encounter: Payer: Self-pay | Admitting: Dietician

## 2020-09-24 ENCOUNTER — Encounter: Payer: BC Managed Care – PPO | Attending: Nurse Practitioner | Admitting: Dietician

## 2020-09-24 ENCOUNTER — Other Ambulatory Visit: Payer: Self-pay

## 2020-09-24 ENCOUNTER — Ambulatory Visit (INDEPENDENT_AMBULATORY_CARE_PROVIDER_SITE_OTHER): Payer: BC Managed Care – PPO | Admitting: Nurse Practitioner

## 2020-09-24 ENCOUNTER — Encounter: Payer: Self-pay | Admitting: Nurse Practitioner

## 2020-09-24 VITALS — BP 151/81 | HR 61 | Ht 70.0 in | Wt 267.6 lb

## 2020-09-24 DIAGNOSIS — E782 Mixed hyperlipidemia: Secondary | ICD-10-CM | POA: Diagnosis not present

## 2020-09-24 DIAGNOSIS — E1165 Type 2 diabetes mellitus with hyperglycemia: Secondary | ICD-10-CM

## 2020-09-24 DIAGNOSIS — E1142 Type 2 diabetes mellitus with diabetic polyneuropathy: Secondary | ICD-10-CM | POA: Diagnosis present

## 2020-09-24 MED ORDER — ONETOUCH VERIO VI STRP: ORAL_STRIP | 12 refills | Status: DC

## 2020-09-24 MED ORDER — GLIPIZIDE ER 5 MG PO TB24: 5.0000 mg | ORAL_TABLET | Freq: Every day | ORAL | 3 refills | Status: DC

## 2020-09-24 MED ORDER — ONETOUCH ULTRASOFT LANCETS MISC: 12 refills | Status: DC

## 2020-09-24 NOTE — Patient Instructions (Addendum)
Work towards eating three meals a day, about 5-6 hours apart!  Begin to recognize carbohydrates in your food choices!  Have 3 carb choices at each meal (45 g).   Begin to build your meals using the proportions of the Balanced Plate. . First, select your carb choice(s) for the meal, and determine how much you should have to equal 3 carb choices (45 g). . Next, select your source of protein to pair with your carb choice(s). . Finally, complete the remaining half of your meal with a variety of non-starchy vegetables.  Eat your meals slowly to reduce over-consumption  Continue your walks and look into "Arm Chair Exercises" for ways to stay active if your feet bother you!

## 2020-09-24 NOTE — Progress Notes (Signed)
Endocrinology Consult Note       09/24/2020, 11:35 AM   Subjective:    Patient ID: Angel [name], male    DOB: Aug 23, 1955.  Angel [name] is being seen in consultation for management of currently uncontrolled symptomatic diabetes requested by  [name], PA-C.   Past Medical History:  Diagnosis Date  . Bipolar disorder ([place])   . 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 Depression   . GERD (gastroesophageal reflux disease)   . Headache   . Hepatitis B    Hepatitis E. antigen positive by history, status post treatment with Hepsera and baraclude   . Hepatitis B carrier ([place])   . Personal history of colonic polyps    Adenomas polyp 2008 and 2010, 2015   . Weight loss    Pt. has lost 10 lbs since pre-visit, intentionally with diet and walking    Past Surgical History:  Procedure Laterality Date  . APPENDECTOMY    . COLONOSCOPY    . POLYPECTOMY    . UPPER GASTROINTESTINAL ENDOSCOPY      Social History   Socioeconomic History  . Marital status: Single    Spouse name: [name]  . Number of children: 0  . Years of education: Not on file  . Highest education level: Master's degree (e.g., MA, MS, MEng, MEd, MSW, MBA)  Occupational History  . Occupation: retired    Fish farm manager: NOT EMPLOYED  Tobacco Use  . Smoking status: Former Smoker    Types: Cigarettes    Quit date: 05/08/2009    Years since quitting: 11.3  . Smokeless tobacco: Never Used  Vaping Use  . Vaping Use: Never used  Substance and Sexual Activity  . Alcohol use: No    Alcohol/week: 0.0 standard drinks  . Drug use: No  . Sexual activity: Not Currently  Other Topics Concern  . Not on file  Social History Narrative   Patient is right-handed. He is married to same sex partner. He gets little exercise.   Caffeine: 4 glasses of tea max/day   Social Determinants of Health   Financial Resource Strain: Not on file  Food  Insecurity: Not on file  Transportation Needs: Not on file  Physical Activity: Not on file  Stress: Not on file  Social Connections: Not on file    Family History  Problem Relation Age of Onset  . Heart disease Mother   . Diabetes Father   . Kidney failure Father   . Kidney disease Father   . Other Sister        GERD  . Colon cancer Neg Hx   . Rectal cancer Neg Hx   . Stomach cancer Neg Hx   . Esophageal cancer Neg Hx   . Headache Neg Hx   . Migraines Neg Hx     Outpatient Encounter Medications as of 09/24/2020  Medication Sig  . glipiZIDE (GLUCOTROL XL) 5 MG 24 hr tablet Take 1 tablet (5 mg total) by mouth daily with breakfast.  . glucose blood (ONETOUCH VERIO) test strip Use as instructed to monitor glucose twice daily, before breakfast and before bed  . Lancets (ONETOUCH ULTRASOFT) lancets Use as instructed to  monitor glucose twice daily  . ALPRAZolam (XANAX) 0.25 MG tablet Take 0.25 mg by mouth at bedtime.  . Blood Glucose Monitoring Suppl (ACCU-CHEK AVIVA PLUS) w/Device KIT Check sugar TID . Dx code:R73.9  . diphenoxylate-atropine (LOMOTIL) 2.5-0.025 MG tablet Take 1 tablet by mouth 4 (four) times daily as needed for diarrhea or loose stools.  . Doxepin HCl 3 MG TABS Take 1 tablet by mouth at bedtime.  [employer] Gu[employer] Devices (LANCING DEVICE) MISC 1 Device by Does not apply route as directed.  . lithium 600 MG capsule TAKE 1 CAPSULE BY MOUTH EVERY MORNING AND 2 CAPSULES EVERY NIGHT AT BEDTIME  . Melatonin 10 MG SUBL Place 10 mg under the tongue at bedtime.  . methocarbamol (ROBAXIN) 750 MG tablet Take 1 tablet (750 mg total) by mouth every 8 (eight) hours as needed for muscle spasms.  . nadolol (CORGARD) 40 MG tablet TAKE 1 TABLET BY MOUTH EVERY DAY (Patient taking differently: 20 mg daily.)  . Pancrelipase, Lip-Prot-Amyl, (CREON PO) Take by mouth. 2400 units 8 times a day  . [DISCONTINUED] Accu-Chek Softclix Lancets lancets Check sugar TID . Dx code:R73.9  . [DISCONTINUED]  glucose blood test strip 1 each by Other route 2 (two) times daily.   No facility-administered encounter medications on file as of 09/24/2020.    ALLERGIES: Allergies  Allergen Reactions  . Aripiprazole Other (See Comments)    Tardive Dyskinesia  . Invega [Paliperidone Er] Other (See Comments)    Tardive dyskinsia symptoms  . Quetiapine Diarrhea, Hives, Itching, Nausea And Vomiting, Nausea Only and Shortness Of Breath  . Abilify [Aripiprazole]   . Hycodan [Hydrocodone Bit-Homatrop Mbr]   . Hydrocodone   . Lamictal [Lamotrigine] Rash  . Lexapro [Escitalopram Oxalate] Other (See Comments)    Doesn't Work    VACCINATION STATUS: Immunization History  Administered Date(s) Administered  . Influenza Split 04/11/2011  . Influenza,inj,Quad PF,6+ Mos 04/09/2014, 01/19/2015, 03/27/2016, 02/27/2017, 03/14/2018, 01/02/2019, 02/26/2020  . Influenza,inj,quad, With Preservative 03/21/2013  . Influenza,trivalent, recombinat, inj, PF 04/11/2011  . Pneumococcal Polysaccharide-23 03/21/2013  . Tdap 10/07/2013  . Zoster 08/12/2015  . Zoster Recombinat (Shingrix) 01/02/2019, 03/04/2019    Diabetes He presents for his follow-up diabetic visit. He has type 2 diabetes mellitus. The initial diagnosis of diabetes was made 2 months (Diagnosed approx 2 months ago) ago. His disease course has been stable. There are no hypoglycemic associated symptoms. Associated symptoms include blurred vision, fatigue, foot paresthesias, polydipsia and polyuria. Pertinent negatives for diabetes include no foot ulcerations and no weight loss. There are no hypoglycemic complications. Symptoms are stable. Diabetic complications include nephropathy and peripheral neuropathy. Risk factors for coronary artery disease include diabetes mellitus, dyslipidemia, family history, male sex, obesity and sedentary lifestyle. Current diabetic treatment includes diet. He is compliant with treatment most of the time. His weight is fluctuating  minimally (per partner, has gained 30+ lbs since last year). He is following a generally healthy diet. Meal planning includes avoidance of concentrated sweets. He participates in exercise intermittently. His home blood glucose trend is fluctuating minimally. His breakfast blood glucose range is generally 180-200 mg/dl. His lunch blood glucose range is generally 180-200 mg/dl. His dinner blood glucose range is generally 180-200 mg/dl. His bedtime blood glucose range is generally 180-200 mg/dl. (He presents today, accompanied by his partner, with his meter and logs showing stable, above target fasting and postprandial glycemic profile.  There are no episodes of hypoglycemia documented.) An ACE inhibitor/angiotensin II receptor blocker is not being taken. He does not see  a podiatrist.Eye exam is current.  Hyperlipidemia This is a chronic problem. The current episode started more than 1 year ago. The problem is uncontrolled. Recent lipid tests were reviewed and are variable. Exacerbating diseases include chronic renal disease, diabetes, liver disease and obesity. Factors aggravating his hyperlipidemia include beta blockers and fatty foods. He is currently on no antihyperlipidemic treatment. Compliance problems include adherence to exercise.  Risk factors for coronary artery disease include diabetes mellitus, dyslipidemia, family history, obesity, male sex and a sedentary lifestyle.     Review of systems  Constitutional: + steady body weight,  current Body mass index is 38.4 kg/m. , + fatigue, no subjective hyperthermia, no subjective hypothermia Eyes: no blurry vision, no xerophthalmia ENT: no sore throat, no nodules palpated in throat, no dysphagia/odynophagia, no hoarseness Cardiovascular: no chest pain, no shortness of breath, no palpitations, + leg swelling L>R Respiratory: no cough, no shortness of breath Gastrointestinal: no nausea/vomiting/, + persistent diarrhea (on creon for pancreatic exocrine  insufficiency) Musculoskeletal: no muscle/joint aches Skin: no rashes, no hyperemia Neurological: no tremors, + numbness/tingling to BLE, no dizziness Psychiatric: no depression, no anxiety  Objective:     BP (!) 151/81   Pulse 61   Ht _0  (1.778 m)   Wt 267 lb 9.6 oz (121.4 kg)   BMI 38.40 kg/m   Wt Readings from Last 3 Encounters:  09/24/20 267 lb 9.6 oz (121.4 kg)  09/24/20 269 lb 9.6 oz (122.3 kg)  09/07/20 270 lb (122.5 kg)     BP Readings from Last 3 Encounters:  09/24/20 (!) 151/81  09/07/20 130/74  08/16/20 129/64     Physical Exam- Limited  Constitutional:  Body mass index is 38.4 kg/m. , not in acute distress, normal state of mind Eyes:  EOMI, no exophthalmos Neck: Supple Cardiovascular: RRR, no murmers, rubs, or gallops, mild nonpitting edema to BLE (L>R) Respiratory: Adequate breathing efforts, no crackles, rales, rhonchi, or wheezing Musculoskeletal: no gross deformities, strength intact in all four extremities, no gross restriction of joint movements Skin:  no rashes, no hyperemia Neurological: no tremor with outstretched hands   POCT ABI Results 09/24/20   Right ABI:  1.25      Left ABI:  1.26  Right leg systolic / diastolic: 237/62 mmHg Left leg systolic / diastolic: 831/51 mmHg  Arm systolic / diastolic: 761/60 mmHG  Detailed report will be scanned into patient chart.   CMP ( most recent) CMP     Component Value Date/Time   NA 141 08/16/2020 1633   NA 143 08/09/2020 1551   K 4.4 08/16/2020 1633   CL 105 08/16/2020 1633   CO2 28 08/16/2020 1633   GLUCOSE 229 (H) 08/16/2020 1633   BUN 15 08/16/2020 1633   BUN 11 08/09/2020 1551   CREATININE 1.11 08/16/2020 1633   CREATININE 1.00 03/10/2020 1011   CALCIUM 9.5 08/16/2020 1633   PROT 6.6 08/16/2020 1633   PROT 6.9 08/09/2020 1551   ALBUMIN 3.9 08/16/2020 1633   ALBUMIN 4.3 08/09/2020 1551   AST 21 08/16/2020 1633   ALT 39 08/16/2020 1633   ALKPHOS 76 08/16/2020 1633   BILITOT  0.4 08/16/2020 1633   BILITOT 0.4 08/09/2020 1551   GFRNONAA 94 05/06/2019 1324   GFRNONAA 92 02/09/2017 1527   GFRAA 109 05/06/2019 1324   GFRAA 106 02/09/2017 1527     Diabetic Labs (most recent): Lab Results  Component Value Date   HGBA1C 8.4 (H) 08/09/2020   HGBA1C 6.7 (A) 02/10/2020  HGBA1C 6.3 02/28/2019     Lipid Panel ( most recent) Lipid Panel     Component Value Date/Time   CHOL 156 02/10/2020 1054   TRIG 258 (H) 02/10/2020 1054   HDL 29 (L) 02/10/2020 1054   CHOLHDL 5.4 (H) 02/10/2020 1054   VLDL 32.8 09/28/2016 1659   LDLCALC 93 02/10/2020 1054      Lab Results  Component Value Date   TSH 0.72 11/28/2019   TSH 3.160 05/06/2019   TSH 2.43 10/11/2017   TSH 3.955 04/22/2015   TSH 2.87 10/07/2013           Assessment & Plan:   1) Uncontrolled Type 2 Diabetes with hyperglycemia  He presents today, accompanied by his partner, with his meter and logs showing stable, above target fasting and postprandial glycemic profile.  There are no episodes of hypoglycemia documented.  - Angel [name] has currently uncontrolled symptomatic type 2 DM since 65 years of age (recently diagnosed approx 2 months ago), with most recent A1c of 8.4 %.   -Recent labs reviewed.  - I had a long discussion with him about the progressive nature of diabetes and the pathology behind its complications. -his diabetes is complicated by peripheral neuropathy and mild CKD and he remains at a high risk for more acute and chronic complications which include CAD, CVA, and retinopathy. These are all discussed in detail with him.  - Nutritional counseling repeated at each appointment due to patients tendency to fall back in to old habits.  - The patient admits there is a room for improvement in their diet and drink choices. -  Suggestion is made for the patient to avoid simple carbohydrates from their diet including Cakes, Sweet Desserts / Pastries, Ice Cream, Soda (diet and regular), Sweet  Tea, Candies, Chips, Cookies, Sweet Pastries, Store Bought Juices, Alcohol in Excess of 1-2 drinks a day, Artificial Sweeteners, Coffee Creamer, and "Sugar-free" Products. This will help patient to have stable blood glucose profile and potentially avoid unintended weight gain.   - I encouraged the patient to switch to unprocessed or minimally processed complex starch and increased protein intake (animal or plant source), fruits, and vegetables.   - Patient is advised to stick to a routine mealtimes to eat 3 meals a day and avoid unnecessary snacks (to snack only to correct hypoglycemia).  - he has been scheduled with Billey Gosling, RDN for diabetes education.  - I have approached him with the following individualized plan to manage  his diabetes and patient agrees:   -Based on his persistent hyperglycemia, I discussed and initiated low dose Glipizide 5 mg XL daily with breakfast.  May need basal insulin if cannot be controlled alone with oral meds.  -He is encouraged to continue monitoring blood glucose twice daily, before breakfast and before bed, and to call the clinic if he has readings less than 70 or greater than 300 for 3 tests in a row.  - Specific targets for  A1c;  LDL, HDL,  and Triglycerides were discussed with the patient.  2) Blood Pressure /Hypertension:  his blood pressure is controlled to target.   he is advised to continue his current medications including Nadolol 20 mg p.o. daily with breakfast.  3) Lipids/Hyperlipidemia:    Review of his recent lipid panel from 02/10/20 showed controlled  LDL at 93 and elevated triglycerides of 258.  he is not currently on any lipid lowering medications.  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   4)  Weight/Diet:  his Body mass index is 38.4 kg/m.  -  clearly complicating his diabetes care.   he is a candidate for weight loss. I discussed with him the fact that loss of 5 - 10% of his  current body weight will have  the most impact on his diabetes management.  Exercise, and detailed carbohydrates information provided  -  detailed on discharge instructions.  5) Chronic Care/Health Maintenance: -he not on ACEI/ARB or Statin medications and is encouraged to initiate and continue to follow up with Ophthalmology, Dentist, Podiatrist at least yearly or according to recommendations, and advised to stay away from smoking. I have recommended yearly flu vaccine and pneumonia vaccine at least every 5 years; moderate intensity exercise for up to 150 minutes weekly; and sleep for at least 7 hours a day.  - he is advised to maintain close follow up with S[place]ier, Percell Miller, PA-C for primary care needs, as well as his other providers for optimal and coordinated care.     I spent 47 minutes in the care of the patient today including review of labs from [address], Lipids, Thyroid Function, Hematology (current and previous including abstractions from other facilities); face-to-face time discussing  his blood glucose readings/logs, discussing hypoglycemia and hyperglycemia episodes and symptoms, medications doses, his options of short and long term treatment based on the latest standards of care / guidelines;  discussion about incorporating lifestyle medicine;  and documenting the encounter.    Please refer to Patient Instructions for Blood Glucose Monitoring and Insulin/Medications Dosing Guide"  in media tab for additional information. Please  also refer to " Patient Self Inventory" in the Media  tab for reviewed elements of pertinent patient history.  Angel [name] participated in the discussions, expressed understanding, and voiced agreement with the above plans.  All questions were answered to his satisfaction. he is encouraged to contact clinic should he have any questions or concerns prior to his return visit.   Follow up plan: - Return in about 3 months (around 12/25/2020) for Diabetes F/U with A1c in office, Bring meter and logs,  No previsit labs.  Rayetta Pigg, [facility] [address] [place], [place] [address] Phone: [phone] Fax: [phone]  09/24/2020, 11:35 AM

## 2020-09-24 NOTE — Patient Instructions (Signed)

## 2020-09-24 NOTE — Progress Notes (Signed)
Medical Nutrition Therapy  Appointment Start time:  0930  Appointment End time:  13  Primary concerns today: Glycemic Control  Referral diagnosis: E11.65 T2DM with hyperglycemia, without long term insulin use,  E78.2 Mixed Hyperlipidemia Preferred learning style: Visual, Hands on Learning readiness: Change in progress   NUTRITION ASSESSMENT   Anthropometrics  Ht: 5'10" Wt: 269.5 lbs Body mass index is 38.68 kg/m.   Clinical Medical Hx: DM, Hepatitis B, Exocrine Pancreatic insufficiency Medications: Creon Labs: A1c - 8.4  Notable Signs/Symptoms: Central adiposity, lower leg edema  Lifestyle & Dietary Hx Pt has friend Jenny Reichmann present with them at the appointment  Pt just finished moving last night and is only eating meals away from home until they get settled in their new place. Pt is currently checking 4 times a day. Target BG is 140 mg/dL. Pt brought BG log to appointment. FBG ~180-200 and CBG ~200. Only 1 instance of hyperglycemia (356) and pt states it was due to orange shrimp chinese food. Pt has stopped drinking sodas for the last three weeks, and is drinking up to 2 gallons of water a day. Pt also stopped drinking juice with breakfast.  Pt reports going for walks and noticing their BG is lower afterwards. Pt was previously in PT for difficulty standing up and walking. Pt is experiencing some swelling and pain in their feet when walking for long periods of time. Pt has bi-polar and reports it is well controlled with medication. Pt has a temporary upper dental plate implant that has affected their taste. Pt also has a couple abscess teeth due to be removed.     Estimated daily fluid intake: 250 oz Supplements: N/A Sleep: Normal Stress / self-care: Moving stress, loss of a beloved pet Current average weekly physical activity: ADLs, light walk  24-Hr Dietary Recall First Meal: egg, sausage, cheese omelette, grits w/butter, water, biscuit w/sugar free grape jelly Snack:  none Second Meal: none Snack: none Third Meal: Meatloaf w/gravy, baked beans, corn, sugar free Jello Snack: none Beverages: water, unsweet tea   NUTRITION DIAGNOSIS  NB-1.1 Food and nutrition-related knowledge deficit As related to diabetes.  As evidenced by A1c of 8.4, skipping meals, previous over consumption of carbs and SSB, and no prior dietary education.   NUTRITION INTERVENTION  Nutrition education (E-1) on the following topics:  Educated patient on the pathophysiology of diabetes. This includes why our bodies need circulating blood sugar, the relationship between insulin and blood sugar, and the results of insulin resistance and/or pancreatic insufficiency on the development of diabetes. Educated patient on factors that contribute to elevation of blood sugars, such as stress, illness, injury,and food choices. Discussed the role that physical activity plays in lowering blood sugar. Educate patient on the three main macronutrients. Protein, fats, and carbohydrates. Discussed how each of these macronutrients affect blood sugar levels, especially carbohydrate, and the importance of eating a consistent amount of carbohydrate throughout the day. Educated patient on carbohydrate counting, 15g of carbohydrate equals one carb choice. Advised patient on the importance of consistently checking their blood sugar, and recognizing how lifestyle and food choices affect those numbers. Advised patient to eat three meals a day, 4-5 hours apart and consistent carbohydrates. Follow the balanced plate method of 1/2 plate non-starchy vegetables, 1/4 plate lean protein, 1/4 plate starches   Handouts Provided Include   Balanced Plate  Label Reading Tips  Snack Sheet  Yellow Meal Plan Card  Learning Style & Readiness for Change Teaching method utilized: Visual & Auditory  Demonstrated  degree of understanding via: Teach Back  Barriers to learning/adherence to lifestyle change: Moving into new  house  Goals Established by Pt  Work towards eating three meals a day, about 5-6 hours apart!  Begin to recognize carbohydrates in your food choices!  Have 3 carb choices at each meal (45 g).   Begin to build your meals using the proportions of the Balanced Plate.  First, select your carb choice(s) for the meal, and determine how much you should have to equal 3 carb choices (45 g).  Next, select your source of protein to pair with your carb choice(s).  Finally, complete the remaining half of your meal with a variety of non-starchy vegetables.  Eat your meals slowly to reduce over-consumption  Continue your walks and look into "Arm Chair Exercises" for ways to stay active if your feet bother you!   MONITORING & EVALUATION Dietary intake, weekly physical activity, and blood glucose control PRN.  Next Steps  Patient is to follow up with Rayetta Pigg, NP, and RDN.

## 2020-10-06 ENCOUNTER — Ambulatory Visit (INDEPENDENT_AMBULATORY_CARE_PROVIDER_SITE_OTHER): Payer: BC Managed Care – PPO | Admitting: Medical

## 2020-10-06 ENCOUNTER — Other Ambulatory Visit: Payer: Self-pay

## 2020-10-06 ENCOUNTER — Ambulatory Visit (HOSPITAL_BASED_OUTPATIENT_CLINIC_OR_DEPARTMENT_OTHER)
Admission: RE | Admit: 2020-10-06 | Discharge: 2020-10-06 | Disposition: A | Discharge status: Home / Self Care | Payer: BC Managed Care – PPO | Source: Ambulatory Visit | Attending: Medical | Admitting: Medical

## 2020-10-06 VITALS — BP 149/60 | HR 60 | Resp 18 | Ht 70.0 in | Wt 273.0 lb

## 2020-10-06 DIAGNOSIS — R0683 Snoring: Secondary | ICD-10-CM | POA: Diagnosis not present

## 2020-10-06 DIAGNOSIS — Z6838 Body mass index (BMI) 38.0-38.9, adult: Secondary | ICD-10-CM

## 2020-10-06 DIAGNOSIS — R5383 Other fatigue: Secondary | ICD-10-CM

## 2020-10-06 DIAGNOSIS — R2689 Other abnormalities of gait and mobility: Secondary | ICD-10-CM | POA: Diagnosis not present

## 2020-10-06 DIAGNOSIS — R600 Localized edema: Secondary | ICD-10-CM | POA: Diagnosis not present

## 2020-10-06 DIAGNOSIS — E1142 Type 2 diabetes mellitus with diabetic polyneuropathy: Secondary | ICD-10-CM

## 2020-10-06 DIAGNOSIS — F3130 Bipolar disorder, current episode depressed, mild or moderate severity, unspecified: Secondary | ICD-10-CM | POA: Diagnosis not present

## 2020-10-06 DIAGNOSIS — R0600 Dyspnea, unspecified: Secondary | ICD-10-CM | POA: Insufficient documentation

## 2020-10-06 DIAGNOSIS — R5381 Other malaise: Secondary | ICD-10-CM

## 2020-10-06 DIAGNOSIS — E669 Obesity, unspecified: Secondary | ICD-10-CM | POA: Diagnosis not present

## 2020-10-06 NOTE — Patient Instructions (Addendum)
Recent increasing weight/obesity with deconditioning. I think increase weight and central obesity effecting balance. Will refer to both weight loss management clinic and PT.  Some snoring and not feeling well rested. Will refer to pulmonologist for sleep apnea evaluation.  For pedal edema and dyspnea on exertion will get chest xray today.  For fatigue place cbc, cmp, b12, b1, cbc and cmp.  For diabetes continue with glipizide and metformin.   For diarrhea continue with GI MD treatments.   Follow up date to be determined after lab review.

## 2020-10-06 NOTE — Progress Notes (Signed)
Subjective:    Patient ID: Angel [name], male    DOB: [birth year], 65 y.o.   MRN: [id number]  HPI  Pt in for follow up.  Pt update me moved to [place].  Main complaint today is mobility issues. Pt has feel unstable at times when walks. He describes being less flexible.   Pt is seeing endocrinologist and is seeing NP. Pt is glipizide. Sugar level is not controlled.  Pt is still having loose stools. Chronic loose stools and he is seeing specialist/Gi. Pt is on creon and lomotil.  Pt is eating out a lot. He is gaining wt.   Pt is getting ct of your chest. Our office ordered. Pt has hx of pneumonia.   Pt has gained weight about 20 lbs since 12-17-2019.      Review of Systems  Constitutional: Negative for chills, fatigue and fever.  Respiratory: Negative for cough, chest tightness, shortness of breath and wheezing.   Cardiovascular: Negative for chest pain and palpitations.  Gastrointestinal: Negative for abdominal pain, diarrhea, nausea and rectal pain.  Musculoskeletal: Negative for back pain and neck pain.  Skin: Negative for rash.  Neurological: Negative for syncope, facial asymmetry, speech difficulty, weakness and numbness.  Hematological: Negative for adenopathy. Does not bruise/bleed easily.  Psychiatric/Behavioral: Negative for behavioral problems, decreased concentration, sleep disturbance and suicidal ideas. The patient is not nervous/anxious.     Past Medical History:  Diagnosis Date  . Bipolar disorder ([place])   . 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 Depression   . GERD (gastroesophageal reflux disease)   . Headache   . Hepatitis B    Hepatitis E. antigen positive by history, status post treatment with Hepsera and baraclude   . Hepatitis B carrier ([place])   . Personal history of colonic polyps    Adenomas polyp 2008 and 2010, 2015   . Weight loss    Pt. has lost 10 lbs since pre-visit, intentionally with diet and walking     Social History    Socioeconomic History  . Marital status: Single    Spouse name: [name]  . Number of children: 0  . Years of education: Not on file  . Highest education level: Master's degree (e.g., MA, MS, MEng, MEd, MSW, MBA)  Occupational History  . Occupation: retired    Fish farm manager: NOT EMPLOYED  Tobacco Use  . Smoking status: Former Smoker    Types: Cigarettes    Quit date: 05/08/2009    Years since quitting: 11.4  . Smokeless tobacco: Never Used  Vaping Use  . Vaping Use: Never used  Substance and Sexual Activity  . Alcohol use: No    Alcohol/week: 0.0 standard drinks  . Drug use: No  . Sexual activity: Not Currently  Other Topics Concern  . Not on file  Social History Narrative   Patient is right-handed. He is married to same sex partner. He gets little exercise.   Caffeine: 4 glasses of tea max/day   Social Determinants of Health   Financial Resource Strain: Not on file  Food Insecurity: Not on file  Transportation Needs: Not on file  Physical Activity: Not on file  Stress: Not on file  Social Connections: Not on file  Intimate Partner Violence: Not on file    Past Surgical History:  Procedure Laterality Date  . APPENDECTOMY    . COLONOSCOPY    . POLYPECTOMY    . UPPER GASTROINTESTINAL ENDOSCOPY      Family History  Problem  Relation Age of Onset  . Heart disease Mother   . Diabetes Father   . Kidney failure Father   . Kidney disease Father   . Other Sister        GERD  . Colon cancer Neg Hx   . Rectal cancer Neg Hx   . Stomach cancer Neg Hx   . Esophageal cancer Neg Hx   . Headache Neg Hx   . Migraines Neg Hx     Allergies  Allergen Reactions  . Aripiprazole Other (See Comments)    Tardive Dyskinesia  . Invega [Paliperidone Er] Other (See Comments)    Tardive dyskinsia symptoms  . Quetiapine Diarrhea, Hives, Itching, Nausea And Vomiting, Nausea Only and Shortness Of Breath  . Abilify [Aripiprazole]   . Hycodan [Hydrocodone Bit-Homatrop Mbr]   .  Hydrocodone   . Lamictal [Lamotrigine] Rash  . Lexapro [Escitalopram Oxalate] Other (See Comments)    Doesn't Work    Current Outpatient Medications on File Prior to Visit  Medication Sig Dispense Refill  . ALPRAZolam (XANAX) 0.25 MG tablet Take 0.25 mg by mouth at bedtime.    . Blood Glucose Monitoring Suppl (ACCU-CHEK AVIVA PLUS) w/Device KIT Check sugar TID . Dx code:R73.9 1 kit 0  . diphenoxylate-atropine (LOMOTIL) 2.5-0.025 MG tablet Take 1 tablet by mouth 4 (four) times daily as needed for diarrhea or loose stools. 20 tablet 0  . Doxepin HCl 3 MG TABS Take 1 tablet by mouth at bedtime.    Marland Kitchen glipiZIDE (GLUCOTROL XL) 5 MG 24 hr tablet Take 1 tablet (5 mg total) by mouth daily with breakfast. 30 tablet 3  . glucose blood (ONETOUCH VERIO) test strip Use as instructed to monitor glucose twice daily, before breakfast and before bed 100 each 12  . Lancet Devices (LANCING DEVICE) MISC 1 Device by Does not apply route as directed. 1 each 1  . Lancets (ONETOUCH ULTRASOFT) lancets Use as instructed to monitor glucose twice daily 100 each 12  . lithium 600 MG capsule TAKE 1 CAPSULE BY MOUTH EVERY MORNING AND 2 CAPSULES EVERY NIGHT AT BEDTIME 90 capsule 5  . Melatonin 10 MG SUBL Place 10 mg under the tongue at bedtime.    . methocarbamol (ROBAXIN) 750 MG tablet Take 1 tablet (750 mg total) by mouth every 8 (eight) hours as needed for muscle spasms. 30 tablet 0  . nadolol (CORGARD) 40 MG tablet TAKE 1 TABLET BY MOUTH EVERY DAY (Patient taking differently: 20 mg daily.) 30 tablet 0  . Pancrelipase, Lip-Prot-Amyl, (CREON PO) Take by mouth. 2400 units 8 times a day    . VRAYLAR 1.5 MG capsule Take 1.5 mg by mouth daily.     No current facility-administered medications on file prior to visit.    BP (!) 149/60   Pulse 60   Resp 18   Ht _0  (1.778 m)   Wt 273 lb (123.8 kg)   SpO2 92%   BMI 39.17 kg/m      Objective:   Physical Exam   General Mental Status- Alert. General Appearance- Not  in acute distress.   Skin General: Color- Normal Color. Moisture- Normal Moisture.  Neck Carotid Arteries- Normal color. Moisture- Normal Moisture. No carotid bruits. No JVD.  Chest and Lung Exam Auscultation: Breath Sounds:-Normal.  Cardiovascular Auscultation:Rythm- Regular. Murmurs & Other Heart Sounds:Auscultation of the heart reveals- No Murmurs.  Abdomen Inspection:-Inspeection Normal. Palpation/Percussion:Note:No mass. Palpation and Percussion of the abdomen reveal- Non Tender, protuberant obese abdomen,  + BS, no rebound  or guarding.   Neurologic Cranial Nerve exam:- CN III-XII intact(No nystagmus), symmetric smile. Strength:- 5/5 equal and symmetric strength both upper and lower extremities.   Lower ext- symmetric 1+ pedal edema. Negative homans sign.     Assessment & Plan:  Recent increasing weight/obesity with deconditioning. I think increase weight and central obesity effecting balance. Will refer to both weight loss management clinic and PT.  Some snoring and not feeling well rested. Will refer to pulmonologist for sleep apnea evaluation.  For pedal edema and dyspnea on exertion will get chest xray today.  For fatigue place cbc, cmp, b12, b1, cbc and cmp.  For diabetes continue with glipizide and metformin.   For diarrhea continue with GI MD treatments.   Follow up date to be determined after lab review.  Time spent with patient today was  44 minutes which consisted of chart revdiew, discussing diagnosis, work up treatment and documentation.

## 2020-10-07 LAB — CBC WITH DIFFERENTIAL/PLATELET
Basophils Absolute: 0 10*3/uL (ref 0.0–0.1)
Basophils Relative: 0.5 % (ref 0.0–3.0)
Eosinophils Absolute: 0.1 10*3/uL (ref 0.0–0.7)
Eosinophils Relative: 1.7 % (ref 0.0–5.0)
HCT: 39.6 % (ref 39.0–52.0)
Hemoglobin: 13.3 g/dL (ref 13.0–17.0)
Lymphocytes Relative: 24.7 % (ref 12.0–46.0)
Lymphs Abs: 1 10*3/uL (ref 0.7–4.0)
MCHC: 33.5 g/dL (ref 30.0–36.0)
MCV: 96.4 fl (ref 78.0–100.0)
Monocytes Absolute: 0.4 10*3/uL (ref 0.1–1.0)
Monocytes Relative: 10.7 % (ref 3.0–12.0)
Neutro Abs: 2.5 10*3/uL (ref 1.4–7.7)
Neutrophils Relative %: 62.4 % (ref 43.0–77.0)
Platelets: 65 10*3/uL — ABNORMAL LOW (ref 150.0–400.0)
RBC: 4.11 Mil/uL — ABNORMAL LOW (ref 4.22–5.81)
RDW: 14.5 % (ref 11.5–15.5)
WBC: 4 10*3/uL (ref 4.0–10.5)

## 2020-10-07 LAB — BRAIN NATRIURETIC PEPTIDE: Pro B Natriuretic peptide (BNP): 37 pg/mL (ref 0.0–100.0)

## 2020-10-07 LAB — VITAMIN B12: Vitamin B-12: 423 pg/mL (ref 211–911)

## 2020-10-08 ENCOUNTER — Encounter: Payer: Self-pay | Admitting: Medical

## 2020-10-08 LAB — COMPREHENSIVE METABOLIC PANEL
ALT: 49 U/L (ref 0–53)
AST: 37 U/L (ref 0–37)
Albumin: 4.1 g/dL (ref 3.5–5.2)
Alkaline Phosphatase: 71 U/L (ref 39–117)
BUN: 11 mg/dL (ref 6–23)
CO2: 23 mEq/L (ref 19–32)
Calcium: 8.9 mg/dL (ref 8.4–10.5)
Chloride: 111 mEq/L (ref 96–112)
Creatinine, Ser: 0.96 mg/dL (ref 0.40–1.50)
GFR: 82.89 mL/min (ref >60.00)
Glucose, Bld: 119 mg/dL — ABNORMAL HIGH (ref 70–99)
Potassium: 3.8 mEq/L (ref 3.5–5.1)
Sodium: 146 mEq/L — ABNORMAL HIGH (ref 135–145)
Total Bilirubin: 0.3 mg/dL (ref 0.2–1.2)
Total Protein: 6.5 g/dL (ref 6.0–8.3)

## 2020-10-10 LAB — VITAMIN B1: Vitamin B1 (Thiamine): 16 nmol/L (ref 8–30)

## 2020-10-11 ENCOUNTER — Other Ambulatory Visit: Payer: Self-pay

## 2020-10-11 ENCOUNTER — Ambulatory Visit (HOSPITAL_COMMUNITY)
Admission: RE | Admit: 2020-10-11 | Discharge: 2020-10-11 | Disposition: A | Discharge status: Home / Self Care | Payer: BC Managed Care – PPO | Source: Ambulatory Visit | Attending: Family Medicine | Admitting: Family Medicine

## 2020-10-11 DIAGNOSIS — R9389 Abnormal findings on diagnostic imaging of other specified body structures: Secondary | ICD-10-CM

## 2020-10-12 ENCOUNTER — Encounter: Payer: Self-pay | Admitting: Medical

## 2020-11-01 ENCOUNTER — Other Ambulatory Visit: Payer: Self-pay

## 2020-11-04 DIAGNOSIS — K766 Portal hypertension: Principal | ICD-10-CM

## 2020-11-04 DIAGNOSIS — I8510 Secondary esophageal varices without bleeding: Principal | ICD-10-CM

## 2020-11-04 DIAGNOSIS — B181 Chronic viral hepatitis B without delta-agent: Principal | ICD-10-CM

## 2020-11-04 DIAGNOSIS — K7469 Other cirrhosis of liver: Principal | ICD-10-CM

## 2020-11-10 MED ORDER — NADOLOL 20 MG TABLET
ORAL_TABLET | Freq: Every day | ORAL | 1 refills | 90 days | Status: CP
Start: 2020-11-10 — End: 2021-05-09

## 2020-11-15 ENCOUNTER — Other Ambulatory Visit: Payer: Self-pay | Admitting: Nurse Practitioner

## 2020-11-15 ENCOUNTER — Other Ambulatory Visit: Payer: Self-pay

## 2020-11-15 MED ORDER — GLIPIZIDE ER 5 MG PO TB24: 5.0000 mg | ORAL_TABLET | Freq: Every day | ORAL | 1 refills | Status: DC

## 2020-12-15 ENCOUNTER — Ambulatory Visit: Admit: 2020-12-15 | Payer: MEDICARE

## 2020-12-28 DIAGNOSIS — B181 Chronic viral hepatitis B without delta-agent: Principal | ICD-10-CM

## 2020-12-28 DIAGNOSIS — K7290 Hepatic failure, unspecified without coma: Principal | ICD-10-CM

## 2020-12-28 MED ORDER — VEMLIDY 25 MG TABLET
ORAL_TABLET | Freq: Every day | ORAL | 0 refills | 30 days | Status: CP
Start: 2020-12-28 — End: ?

## 2020-12-31 ENCOUNTER — Ambulatory Visit: Payer: Medicare HMO | Admitting: Dietician

## 2020-12-31 ENCOUNTER — Ambulatory Visit: Payer: Medicare HMO | Admitting: Nurse Practitioner

## 2021-01-12 ENCOUNTER — Ambulatory Visit: Payer: BC Managed Care – PPO | Admitting: Nurse Practitioner

## 2021-01-12 ENCOUNTER — Other Ambulatory Visit: Payer: Self-pay

## 2021-01-12 ENCOUNTER — Encounter: Payer: Self-pay | Admitting: Nurse Practitioner

## 2021-01-12 VITALS — BP 97/61 | HR 60 | Ht 70.0 in | Wt 273.4 lb

## 2021-01-12 DIAGNOSIS — E1165 Type 2 diabetes mellitus with hyperglycemia: Secondary | ICD-10-CM

## 2021-01-12 DIAGNOSIS — E559 Vitamin D deficiency, unspecified: Secondary | ICD-10-CM

## 2021-01-12 DIAGNOSIS — E782 Mixed hyperlipidemia: Secondary | ICD-10-CM

## 2021-01-12 LAB — POCT UA - MICROALBUMIN
Albumin/Creatinine Ratio, Urine, POC: <30
Creatinine, POC: 200 mg/dL
Microalbumin Ur, POC: 10 mg/L

## 2021-01-12 LAB — POCT GLYCOSYLATED HEMOGLOBIN (HGB A1C): Hemoglobin A1C: 6.8 % — AB (ref 4.0–5.6)

## 2021-01-12 MED ORDER — ONETOUCH ULTRASOFT LANCETS MISC: 12 refills | Status: DC

## 2021-01-12 MED ORDER — LANCING DEVICE MISC: 1.0000 | 1 refills | Status: AC

## 2021-01-12 NOTE — Addendum Note (Signed)
Addended by: Anibal Henderson on: 01/12/2021 03:07 PM   Modules accepted: Orders

## 2021-01-12 NOTE — Progress Notes (Signed)
Endocrinology Follow Up Note       01/12/2021, 2:59 PM   Subjective:    Patient ID: Angel [name], male    DOB: [birth year].  Angel [name] is being seen in follow up after being seen in consultation for management of currently uncontrolled symptomatic diabetes requested by  [name], PA-C.   Past Medical History:  Diagnosis Date   Bipolar disorder ([place])    Cirrhosis of liver without mention of alcohol    Depression    GERD (gastroesophageal reflux disease)    Headache    Hepatitis B    Hepatitis E. antigen positive by history, status post treatment with Hepsera and baraclude    Hepatitis B carrier ([place])    Personal history of colonic polyps    Adenomas polyp 2008 and 2010, 2015    Weight loss    Pt. has lost 10 lbs since pre-visit, intentionally with diet and walking    Past Surgical History:  Procedure Laterality Date   APPENDECTOMY     COLONOSCOPY     POLYPECTOMY     UPPER GASTROINTESTINAL ENDOSCOPY      Social History   Socioeconomic History   Marital status: Single    Spouse name: [name]   Number of children: 0   Years of education: Not on file   Highest education level: Master's degree (e.g., MA, MS, MEng, MEd, MSW, MBA)  Occupational History   Occupation: retired    Fish farm manager: NOT EMPLOYED  Tobacco Use   Smoking status: Former    Types: Cigarettes    Quit date: 05/08/2009    Years since quitting: 11.6   Smokeless tobacco: Never  Vaping Use   Vaping Use: Never used  Substance and Sexual Activity   Alcohol use: No    Alcohol/week: 0.0 standard drinks   Drug use: No   Sexual activity: Not Currently  Other Topics Concern   Not on file  Social History Narrative   Patient is right-handed. He is married to same sex partner. He gets little exercise.   Caffeine: 4 glasses of tea max/day   Social Determinants of Health   Financial Resource Strain: Not on file  Food  Insecurity: Not on file  Transportation Needs: Not on file  Physical Activity: Not on file  Stress: Not on file  Social Connections: Not on file    Family History  Problem Relation Age of Onset   Heart disease Mother    Diabetes Father    Kidney failure Father    Kidney disease Father    Other Sister        GERD   Colon cancer Neg Hx    Rectal cancer Neg Hx    Stomach cancer Neg Hx    Esophageal cancer Neg Hx    Headache Neg Hx    Migraines Neg Hx     Outpatient Encounter Medications as of 01/12/2021  Medication Sig   ALPRAZolam (XANAX) 0.5 MG tablet Take 0.5 mg by mouth 2 (two) times daily as needed.   BISACODYL 5 MG EC tablet Take 5 mg by mouth daily as needed.   Blood Glucose Monitoring Suppl (ACCU-CHEK AVIVA PLUS) w/Device KIT Check sugar TID .  Dx code:R73.9   diphenoxylate-atropine (LOMOTIL) 2.5-0.025 MG tablet Take 1 tablet by mouth 4 (four) times daily as needed for diarrhea or loose stools.   Doxepin HCl 6 MG TABS Take 1 tablet by mouth at bedtime.   glipiZIDE (GLUCOTROL XL) 5 MG 24 hr tablet TAKE 1 TABLET(5 MG) BY MOUTH DAILY WITH BREAKFAST   glucose blood (ONETOUCH VERIO) test strip Use as instructed to monitor glucose twice daily, before breakfast and before bed   lithium carbonate (ESKALITH) 450 MG CR tablet Take 1,400 mg by mouth daily.   Melatonin 10 MG SUBL Place 10 mg under the tongue at bedtime.   methocarbamol (ROBAXIN) 750 MG tablet Take 1 tablet (750 mg total) by mouth every 8 (eight) hours as needed for muscle spasms.   nadolol (CORGARD) 20 MG tablet Take 20 mg by mouth daily.   Pancrelipase, Lip-Prot-Amyl, (CREON PO) Take by mouth. 2400 units 8 times a day   Pancrelipase, Lip-Prot-Amyl, (PANCREAZE) 37000-97300 units CPEP    Tenofovir Alafenamide Fumarate (VEMLIDY) 25 MG TABS Take by mouth.   VRAYLAR 1.5 MG capsule Take 1.5 mg by mouth daily.   [DISCONTINUED] Lancet Devices (LANCING DEVICE) MISC 1 Device by Does not apply route as directed.   [DISCONTINUED]  Lancets (ONETOUCH ULTRASOFT) lancets Use as instructed to monitor glucose twice daily   Lancet Devices (LANCING DEVICE) MISC 1 Device by Does not apply route as directed. Compatible with onetouch ultrasoft lancets   Lancets (ONETOUCH ULTRASOFT) lancets Use as instructed to monitor glucose twice daily   [DISCONTINUED] ALPRAZolam (XANAX) 0.25 MG tablet Take 0.25 mg by mouth at bedtime. (Patient not taking: Reported on 01/12/2021)   [DISCONTINUED] Doxepin HCl 3 MG TABS Take 1 tablet by mouth at bedtime. (Patient not taking: Reported on 01/12/2021)   [DISCONTINUED] lithium 600 MG capsule TAKE 1 CAPSULE BY MOUTH EVERY MORNING AND 2 CAPSULES EVERY NIGHT AT BEDTIME (Patient not taking: Reported on 01/12/2021)   [DISCONTINUED] nadolol (CORGARD) 40 MG tablet TAKE 1 TABLET BY MOUTH EVERY DAY (Patient not taking: Reported on 01/12/2021)   No facility-administered encounter medications on file as of 01/12/2021.    ALLERGIES: Allergies  Allergen Reactions   Aripiprazole Other (See Comments)    Tardive Dyskinesia   Invega [Paliperidone Er] Other (See Comments)    Tardive dyskinsia symptoms   Quetiapine Diarrhea, Hives, Itching, Nausea And Vomiting, Nausea Only and Shortness Of Breath   Abilify [Aripiprazole]    Eszopiclone Other (See Comments)    Sleep walking   Hydrocodone Itching   Hydrocodone Bit-Homatrop Mbr Rash   Lamictal [Lamotrigine] Rash   Lexapro [Escitalopram Oxalate] Other (See Comments)    Doesn't Work    VACCINATION STATUS: Immunization History  Administered Date(s) Administered   Influenza Split 04/11/2011   Influenza,inj,Quad PF,6+ Mos 04/09/2014, 01/19/2015, 03/27/2016, 02/27/2017, 03/14/2018, 01/02/2019, 02/26/2020   Influenza,inj,quad, With Preservative 03/21/2013   Influenza,trivalent, recombinat, inj, PF 04/11/2011   Pneumococcal Polysaccharide-23 03/21/2013   Tdap 10/07/2013   Zoster Recombinat (Shingrix) 01/02/2019, 03/04/2019   Zoster, Live 08/12/2015    Diabetes He  presents for his follow-up diabetic visit. He has type 2 diabetes mellitus. The initial diagnosis of diabetes was made 2 months (Diagnosed approx 2 months ago) ago. His disease course has been stable. There are no hypoglycemic associated symptoms. Associated symptoms include blurred vision, fatigue, foot paresthesias, polydipsia and polyuria. Pertinent negatives for diabetes include no foot ulcerations and no weight loss. There are no hypoglycemic complications. Symptoms are stable. Diabetic complications include nephropathy and peripheral neuropathy. Risk  factors for coronary artery disease include diabetes mellitus, dyslipidemia, family history, male sex, obesity and sedentary lifestyle. Current diabetic treatment includes diet. He is compliant with treatment most of the time. His weight is stable. He is following a generally healthy diet. Meal planning includes avoidance of concentrated sweets. He participates in exercise intermittently. His home blood glucose trend is fluctuating minimally. His breakfast blood glucose range is generally 140-180 mg/dl. His bedtime blood glucose range is generally 140-180 mg/dl. His overall blood glucose range is 140-180 mg/dl. (He presents today, accompanied by his partner, with his logs, no meter, showing stable near target fasting and postprandial glycemic profile.  His POCT A1c today is 6.8%, improving from last visit of 8.4%.  His weight is stable.  He denies any hypoglycemia.  He is working on diet still, could be better he says because they continue to eat out since they do not have a working stove at home.) An ACE inhibitor/angiotensin II receptor blocker is not being taken. He does not see a podiatrist.Eye exam is current.  Hyperlipidemia This is a chronic problem. The current episode started more than 1 year ago. The problem is uncontrolled. Recent lipid tests were reviewed and are variable. Exacerbating diseases include chronic renal disease, diabetes, liver disease  and obesity. Factors aggravating his hyperlipidemia include beta blockers and fatty foods. He is currently on no antihyperlipidemic treatment. Compliance problems include adherence to exercise.  Risk factors for coronary artery disease include diabetes mellitus, dyslipidemia, family history, obesity, male sex and a sedentary lifestyle.    Review of systems  Constitutional: + stable body weight,  current Body mass index is 39.23 kg/m. , + fatigue-improved slightly, no subjective hyperthermia, no subjective hypothermia Eyes: no blurry vision, no xerophthalmia ENT: no sore throat, no nodules palpated in throat, no dysphagia/odynophagia, no hoarseness Cardiovascular: no chest pain, no shortness of breath, no palpitations, + leg swelling L>R-continued Respiratory: no cough, no shortness of breath Gastrointestinal: no nausea/vomiting/, + persistent diarrhea (on creon for pancreatic exocrine insufficiency) Musculoskeletal: no muscle/joint aches Skin: no rashes, no hyperemia Neurological: no tremors, + numbness/tingling to BLE, no dizziness Psychiatric: no depression, no anxiety  Objective:     BP 97/61   Pulse 60   Ht _0  (1.778 m)   Wt 273 lb 6.4 oz (124 kg)   BMI 39.23 kg/m   Wt Readings from Last 3 Encounters:  01/12/21 273 lb 6.4 oz (124 kg)  10/06/20 273 lb (123.8 kg)  09/24/20 267 lb 9.6 oz (121.4 kg)     BP Readings from Last 3 Encounters:  01/12/21 97/61  10/06/20 (!) 149/60  09/24/20 (!) 151/81      Physical Exam- Limited  Constitutional:  Body mass index is 39.23 kg/m. , not in acute distress, normal state of mind Eyes:  EOMI, no exophthalmos Neck: Supple Cardiovascular: RRR, no murmurs, rubs, or gallops, + edema to BLE Respiratory: Adequate breathing efforts, no crackles, rales, rhonchi, or wheezing Musculoskeletal: no gross deformities, strength intact in all four extremities, no gross restriction of joint movements Skin:  no rashes, no hyperemia Neurological:  no tremor with outstretched hands    CMP ( most recent) CMP     Component Value Date/Time   NA 146 (H) 10/06/2020 1552   NA 143 08/09/2020 1551   K 3.8 10/06/2020 1552   CL 111 10/06/2020 1552   CO2 23 10/06/2020 1552   GLUCOSE 119 (H) 10/06/2020 1552   BUN 11 10/06/2020 1552   BUN 11 08/09/2020 1551  CREATININE 0.96 10/06/2020 1552   CREATININE 1.00 03/10/2020 1011   CALCIUM 8.9 10/06/2020 1552   PROT 6.5 10/06/2020 1552   PROT 6.9 08/09/2020 1551   ALBUMIN 4.1 10/06/2020 1552   ALBUMIN 4.3 08/09/2020 1551   AST 37 10/06/2020 1552   ALT 49 10/06/2020 1552   ALKPHOS 71 10/06/2020 1552   BILITOT 0.3 10/06/2020 1552   BILITOT 0.4 08/09/2020 1551   GFRNONAA 94 05/06/2019 1324   GFRNONAA 92 02/09/2017 1527   GFRAA 109 05/06/2019 1324   GFRAA 106 02/09/2017 1527     Diabetic Labs (most recent): Lab Results  Component Value Date   HGBA1C 8.4 (H) 08/09/2020   HGBA1C 6.7 (A) 02/10/2020   HGBA1C 6.3 02/28/2019     Lipid Panel ( most recent) Lipid Panel     Component Value Date/Time   CHOL 156 02/10/2020 1054   TRIG 258 (H) 02/10/2020 1054   HDL 29 (L) 02/10/2020 1054   CHOLHDL 5.4 (H) 02/10/2020 1054   VLDL 32.8 09/28/2016 1659   LDLCALC 93 02/10/2020 1054      Lab Results  Component Value Date   TSH 0.72 11/28/2019   TSH 3.160 05/06/2019   TSH 2.43 10/11/2017   TSH 3.955 04/22/2015   TSH 2.87 10/07/2013           Assessment & Plan:   1) Uncontrolled Type 2 Diabetes with hyperglycemia  He presents today, accompanied by his partner, with his logs, no meter, showing stable near target fasting and postprandial glycemic profile.  His POCT A1c today is 6.8%, improving from last visit of 8.4%.  His weight is stable.  He denies any hypoglycemia.  He is working on diet still, could be better he says because they continue to eat out since they do not have a working stove at home.  - Galileo Colello has currently uncontrolled symptomatic type 2 DM since 65 years  of age.  -Recent labs reviewed.  - I had a long discussion with him about the progressive nature of diabetes and the pathology behind its complications. -his diabetes is complicated by peripheral neuropathy and mild CKD and he remains at a high risk for more acute and chronic complications which include CAD, CVA, and retinopathy. These are all discussed in detail with him.  - Nutritional counseling repeated at each appointment due to patients tendency to fall back in to old habits.  - The patient admits there is a room for improvement in their diet and drink choices. -  Suggestion is made for the patient to avoid simple carbohydrates from their diet including Cakes, Sweet Desserts / Pastries, Ice Cream, Soda (diet and regular), Sweet Tea, Candies, Chips, Cookies, Sweet Pastries, Store Bought Juices, Alcohol in Excess of 1-2 drinks a day, Artificial Sweeteners, Coffee Creamer, and "Sugar-free" Products. This will help patient to have stable blood glucose profile and potentially avoid unintended weight gain.   - I encouraged the patient to switch to unprocessed or minimally processed complex starch and increased protein intake (animal or plant source), fruits, and vegetables.   - Patient is advised to stick to a routine mealtimes to eat 3 meals a day and avoid unnecessary snacks (to snack only to correct hypoglycemia).  - he has been scheduled with Jearld Fenton, RDE for diabetes education.  - I have approached him with the following individualized plan to manage  his diabetes and patient agrees:   -Based on his stable glycemic profile, he is advised to continue Glipizide 5 mg XL  daily with breakfast. May need basal insulin if cannot be controlled alone with oral meds.   -He is encouraged to continue monitoring blood glucose twice daily, before breakfast and before bed, and to call the clinic if he has readings less than 70 or greater than 300 for 3 tests in a row.  - Specific targets for   A1c;  LDL, HDL,  and Triglycerides were discussed with the patient.  2) Blood Pressure /Hypertension:  his blood pressure is controlled to target.   he is advised to continue his current medications including Nadolol 20 mg p.o. daily with breakfast.  3) Lipids/Hyperlipidemia:    Review of his recent lipid panel from 02/10/20 showed controlled  LDL at 93 and elevated triglycerides of 258.  he is not currently on any lipid lowering medications.  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  Will recheck lipid panel prior to next visit.  4)  Weight/Diet:  his Body mass index is 39.23 kg/m.  -  clearly complicating his diabetes care.   he is a candidate for weight loss. I discussed with him the fact that loss of 5 - 10% of his  current body weight will have the most impact on his diabetes management.  Exercise, and detailed carbohydrates information provided  -  detailed on discharge instructions.  5) Chronic Care/Health Maintenance: -he not on ACEI/ARB or Statin medications and is encouraged to initiate and continue to follow up with Ophthalmology, Dentist, Podiatrist at least yearly or according to recommendations, and advised to stay away from smoking. I have recommended yearly flu vaccine and pneumonia vaccine at least every 5 years; moderate intensity exercise for up to 150 minutes weekly; and sleep for at least 7 hours a day.  - he is advised to maintain close follow up with [place], Percell Miller, PA-C for primary care needs, as well as his other providers for optimal and coordinated care.       I spent 36 minutes in the care of the patient today including review of labs from [facility], Lipids, Thyroid Function, Hematology (current and previous including abstractions from other facilities); face-to-face time discussing  his blood glucose readings/logs, discussing hypoglycemia and hyperglycemia episodes and symptoms, medications doses, his options of short and long term treatment  based on the latest standards of care / guidelines;  discussion about incorporating lifestyle medicine;  and documenting the encounter.    Please refer to Patient Instructions for Blood Glucose Monitoring and Insulin/Medications Dosing Guide"  in media tab for additional information. Please  also refer to " Patient Self Inventory" in the Media  tab for reviewed elements of pertinent patient history.  [name] participated in the discussions, expressed understanding, and voiced agreement with the above plans.  All questions were answered to his satisfaction. he is encouraged to contact clinic should he have any questions or concerns prior to his return visit.   Follow up plan: - Return in about 4 months (around 05/14/2021) for Diabetes F/U with A1c in office, Previsit labs, Bring meter and logs.   Rayetta Pigg, [facility] [address] [place], [place] [address] Phone: [phone] Fax: [phone]  01/12/2021, 2:59 PM

## 2021-01-12 NOTE — Patient Instructions (Signed)

## 2021-01-17 ENCOUNTER — Other Ambulatory Visit: Payer: Self-pay

## 2021-01-17 ENCOUNTER — Telehealth: Payer: Self-pay | Admitting: Nurse Practitioner

## 2021-01-17 MED ORDER — ONETOUCH DELICA LANCETS 33G MISC: 3 refills | Status: DC

## 2021-01-17 NOTE — Telephone Encounter (Signed)
Pt states the wrong lancets were sent in, he is needing [facility] [place], [place] AT [place] Phone:  [phone]  Fax:  [phone]

## 2021-01-17 NOTE — Telephone Encounter (Signed)
I have sent the correct lancets to the patients pharmacy.

## 2021-01-19 ENCOUNTER — Telehealth: Admit: 2021-01-19 | Discharge: 2021-01-20 | Payer: MEDICARE

## 2021-01-19 DIAGNOSIS — B181 Chronic viral hepatitis B without delta-agent: Principal | ICD-10-CM

## 2021-01-19 DIAGNOSIS — K7460 Unspecified cirrhosis of liver: Principal | ICD-10-CM

## 2021-01-19 MED ORDER — NADOLOL 20 MG TABLET
ORAL_TABLET | Freq: Every day | ORAL | 1 refills | 90.00000 days | Status: CP
Start: 2021-01-19 — End: 2021-07-18

## 2021-01-21 DIAGNOSIS — B181 Chronic viral hepatitis B without delta-agent: Principal | ICD-10-CM

## 2021-01-21 MED ORDER — TENOFOVIR ALAFENAMIDE 25 MG TABLET
ORAL_TABLET | Freq: Every day | ORAL | 5 refills | 30 days | Status: CP
Start: 2021-01-21 — End: ?
  Filled 2021-01-27: qty 30, 30d supply, fill #0

## 2021-01-21 NOTE — Unmapped (Signed)
Opened in error

## 2021-01-21 NOTE — Unmapped (Signed)
Hepatology Clinic Pharmacist Note    Primary Hepatologist provider: Vallery Sa, NP  Diagnosis: Chronic hep B  Fibrosis: F4  Current regimen: Vemlidy 25mg  daily with food  Pharmacy: [facility] [phone] option #4    Pt was recently approved for [facility] Patient Assistance Program (MPAP) and decided to fill from [facility]. Conference called [employer] to obtain billing information:  ID: [id number]  GRP: [id number]  BIN: [id number]  PCN: [id number]    Forwarded information to [facility] for onboarding. Pt has <2 week supply left.     Pt also inquired about assistance with Vralar. Advised that's not on [facility] PAP formulary but is listed under [facility] Assist program. Advised to reach out to his psych office in assistance with approval. Pt verbalized understanding and thanked me for the call.    Park Breed, Pharm D., BCPS, BCGP, CPP  [facility]  [address]  [place], [place] [address]  [phone]

## 2021-01-21 NOTE — Unmapped (Signed)
Writer received call from patient and scheduled with Morris on 09-26 at 2 PM x 60 minutes.    Patient/parent verbalized understanding:  [x] Clinician type   [] resident/fellow   [x] faculty/non-teaching  [x] Appointment reason   [x] medication/diagnostic clarity(no testing/no therapy)  [] therapy(no meds)    [x] Needs to be in [place] during video visits  [x] Video platform to be used and how to connect    [] doximity   [x] mychart  [x] If clinician has not started the video visit 15 minutes past the scheduled appointment time, call [phone] option # 1  [x] Insurance will be filed for the visit, co-pays/deductible will apply  [x] No-show/cancel policy    Appointment Cancellation and Missed Appointment Policy:  If you need to cancel your appointment, we ask that you call [phone] option 3 at least 24 hours before you are scheduled.  This will provide [place] with enough time to offer the slot to someone waiting for an appointment.    ??? If you miss an appointment and do not call to cancel it, you will receive a mychart message or text notifying that you missed an appointment and asking you to call and reschedule it.  We will not automatically reschedule missed appointments.    ??? If you miss three (3) appointments without letting us know at least 24 hours in advance, you may be terminated from clinic and referred to a provider in your community.  Missed appointments prevent others from obtaining care.

## 2021-01-21 NOTE — Unmapped (Signed)
[facility] Specialty Medication Onboarding    Specialty Medication: VEMLIDY 25 mg Tab tablet (tenofovir alafenamide)  Prior Authorization: Not Required   Financial Assistance: Yes - copay card approved as secondary   Final Copay/Day Supply: $0 / 30    Insurance Restrictions: None     Notes to Pharmacist: n/a    The triage team has completed the benefits investigation and has determined that the patient is able to fill this medication at [facility]. Please contact the patient to complete the onboarding or follow up with the prescribing physician as needed.

## 2021-01-25 NOTE — Unmapped (Signed)
[facility]   Patient Onboarding/Medication Counseling    Nicholas [name] is a 65 y.o. male with Hepatitis B who I am counseling today on continuation of therapy.  I am speaking to the patient.    Was a Nurse, learning disability used for this call? No    Verified patient's date of birth / HIPAA.    Specialty medication(s) to be sent: Infectious Disease: Vemlidy      Non-specialty medications/supplies to be sent: n/a      Medications not needed at this time: n/a         Vemlidy (tenofovir alafenamide) 25mg     The patient declined counseling on medication administration, missed dose instructions, goals of therapy, side effects and monitoring parameters, warnings and precautions, drug/food interactions and storage, handling precautions, and disposal because they have taken the medication previously. The information in the declined sections below are for informational purposes only and was not discussed with patient.       Medication & Administration     Dosage: Take one tablet by mouth daily    Administration: Take with food    Adherence/Missed dose instructions: take missed dose as soon as you remember. If it is close to the time of your next dose, skip the dose and resume with your next scheduled dose. Avoid missing doses as it can result in development of resistance.    Goals of Therapy     To keep HBV DNA levels undetectable  To decrease the morbidity and mortality related to chronic hepatitis B.    Side Effects & Monitoring Parameters     ?? Headache  ?? Abdominal pain.  ?? Fatigue  ?? Cough.   ?? Upset stomach.   ?? Diarrhea.    The following side effects should be reported to the provider:    ?? Signs of an allergic reaction, like rash; hives; itching; red, swollen, blistered, or peeling skin with or without fever; wheezing; tightness in the chest or throat; trouble breathing, swallowing, or talking; unusual hoarseness; or swelling of the mouth, face, lips, tongue, or throat.   ?? Signs of kidney problems like unable to pass urine, change in how much urine is passed, blood in the urine, or a big weight gain.  ?? Signs of liver problems like dark urine, feeling tired, not hungry, upset stomach or stomach pain, light-colored stools, throwing up, or yellow skin or eyes.  ?? Signs of too much lactic acid in the blood (lactic acidosis) like fast breathing, fast heartbeat, a heartbeat that does not feel normal, very bad upset stomach or throwing up, feeling very sleepy, shortness of breath, feeling very tired or weak, very bad dizziness, feeling cold, or muscle pain or cramps    Monitoring Parameters:     ?? HBV e antigen, HBV e antibody, and HBV DNA: every 3-6 months or as clinically indicated  ?? HBV surface antigen, HBV surface Antibody: as clinically indicated   ?? INR: every 3 to 6 months as clinically indicated  ?? Serum creatinine, serum phosphorus, urine glucose, and urine protein prior to initiation of therapy and as clinically indicated during therapy.  ?? Perform HIV testing prior to initiation.   ?? Obtain liver function tests (AST, ALT, Alk Phos, albumin, and bilirubin) as clinically indicated    Contraindications, Warnings, & Precautions     ?? Lactic acidosis/hepatomegaly: Lactic acidosis and severe hepatomegaly with steatosis, sometimes fatal, have been reported with the use of nucleoside analogs, alone or in combination with other antiretrovirals. Suspend  treatment in any patient who develops clinical or laboratory findings suggestive of lactic acidosis or pronounced hepatotoxicity (marked transaminase elevation may/may not accompany hepatomegaly and steatosis).  ?? Hepatic impairment: Use is not recommended in patients with Child-Pugh class B or C hepatic impairment.  ?? Hepatitis B acute exacerbation: [US Boxed Warning]: Discontinuation of anti-hepatitis B therapy may result in severe acute exacerbations of hepatitis B. Monitor clinical and laboratory data closely for several months after treatment discontinuation. If clinically indicated, anti-hepatitis B therapy may be resumed.  ?? HIV-1 and HBV coinfection: Should not be used as a single agent for the treatment of HIV-1 due to resistance development risk.  ?? Renal impairment: Use is not recommended in patients with CrCl <15 mL/minute who are not receiving hemodialysis.  ?? Renal toxicity: Cases of acute renal failure and/or Fanconi syndrome have been reported with use of tenofovir prodrugs; patients with preexisting renal impairment and those taking nephrotoxic agents (including NSAIDs) are at increased risk. Prior to initiation of therapy and during therapy, assess serum creatinine, estimated CrCl, urine protein, and urine glucose in all patients as clinically appropriate; also assess serum phosphorus in patients with chronic kidney disease. Discontinue therapy in patients that develop clinically significant decreases in renal function or evidence of Fanconi syndrome.    Drug/Food Interactions     ??? Medication list reviewed in Epic. The patient was instructed to inform the care team before taking any new medications or supplements. No drug interactions identified.      Storage, Handling Precautions, & Disposal     ??? Store this medication at room temperature.  ??? Store in the original container   ??? Keep lid tightly closed.  ??? Store in a dry place. Do not store in a bathroom.  ??? Keep all drugs in a safe place. Keep all drugs out of the reach of children and pets.  ??? Throw away unused or expired drugs. Do not flush down a toilet or pour down a drain unless you are told to do so. Check with your pharmacist if you have questions about the best way to throw out drugs. There may be drug take-back programs in your area.      Current Medications (including OTC/herbals), Comorbidities and Allergies     Current Outpatient Medications   Medication Sig Dispense Refill   ??? diphenoxylate-atropine (LOMOTIL) 2.5-0.025 mg per tablet Take 1 tablet by mouth four (4) times a day as needed for diarrhea.     ??? glipiZIDE (GLUCOTROL XL) 5 MG 24 hr tablet Take 5 mg by mouth in the morning.     ??? lipase-protease-amylase (PANCREAZE) 37,000-97,300- 149,900 unit CpDR 3 caps with each meal and 2 with snacks     ??? lithium (ESKALITH CR) 450 MG ER tablet Take 1,350 mg by mouth in the morning. 450mg  am and 900mg  in the pm.     ??? ALPRAZolam (XANAX) 0.5 MG tablet Take 0.5 mg by mouth two (2) times a day as needed.     ??? doxepin 6 mg Tab      ??? melatonin 10 mg cap Take 10 mg by mouth nightly.     ??? nadoloL (CORGARD) 20 MG tablet Take 1 tablet (20 mg total) by mouth daily. 90 tablet 1   ??? tenofovir alafenamide (VEMLIDY) 25 mg Tab tablet Take 1 tablet (25 mg total) by mouth daily. Take with food. 30 tablet 5   ??? VRAYLAR 1.5 mg capsule Take 3 mg by mouth daily.  No current facility-administered medications for this visit.       Allergies   Allergen Reactions   ??? Invega [Paliperidone] Other (See Comments)     Tardive dyskinsia symptoms   ??? Aripiprazole Other (See Comments)     Tardive Dyskinesia   ??? Escitalopram Other (See Comments)     Doesn't Work   ??? Hydrocodone Itching   ??? Lamotrigine Rash   ??? Quetiapine Rash       Patient Active Problem List   Diagnosis   ??? HBV (hepatitis B virus) infection   ??? Bipolar disorder, unspecified   ??? Bipolar I disorder, most recent episode depressed (CMS-HCC)   ??? Hepatic cirrhosis (CMS-HCC)   ??? Gastroesophageal reflux disease   ??? Hepatitis B virus infection   ??? Hyperglycemia   ??? History of colonic polyps       Reviewed and up to date in Epic.    Appropriateness of Therapy     Acute infections noted within Epic:  No active infections  Patient reported infection: None    Is medication and dose appropriate based on diagnosis and infection status? Yes    Prescription has been clinically reviewed: Yes      Baseline Quality of Life Assessment      How many days over the past month did your Hepatitis B  keep you from your normal activities? For example, brushing your teeth or getting up in the morning. 0    Financial Information     Medication Assistance provided: Copay Assistance    Anticipated copay of $0.00 reviewed with patient. Verified delivery address.    Delivery Information     Scheduled delivery date: 01/28/21    Expected start date: continuation of existing therapy    Medication will be delivered via UPS to the prescription address in [facility].  This shipment will not require a signature.      Explained the services we provide at [facility] and that each month we would call to set up refills.  Stressed importance of returning phone calls so that we could ensure they receive their medications in time each month.  Informed patient that we should be setting up refills 7-10 days prior to when they will run out of medication.  A pharmacist will reach out to perform a clinical assessment periodically.  Informed patient that a welcome packet, containing information about our pharmacy and other support services, a Notice of Privacy Practices, and a drug information handout will be sent.      The patient or caregiver noted above participated in the development of this care plan and knows that they can request review of or adjustments to the care plan at any time.      Patient or caregiver verbalized understanding of the above information as well as how to contact the pharmacy at [phone] option 4 with any questions/concerns.  The pharmacy is open Monday through Friday 8:30am-4:30pm.  A pharmacist is available 24/7 via pager to answer any clinical questions they may have.    Patient Specific Needs     - Does the patient have any physical, cognitive, or cultural barriers? No    - Does the patient have adequate living arrangements? (i.e. the ability to store and take their medication appropriately) Yes    - Did you identify any home environmental safety or security hazards? No    - Patient prefers to have medications discussed with  Patient     - Is the  patient or caregiver able to read and understand education materials at a high school level or above? Yes    - Patient's primary language is  English     - Is the patient high risk? No    - Does the patient require physician intervention or other additional services (i.e. dietary/nutrition, smoking cessation, social work)? No      Roderic Palau  [facility] Specialty Pharmacist

## 2021-01-31 ENCOUNTER — Telehealth
Admit: 2021-01-31 | Discharge: 2021-02-01 | Payer: MEDICARE | Attending: Student in an Organized Health Care Education/Training Program | Primary: Student in an Organized Health Care Education/Training Program

## 2021-01-31 DIAGNOSIS — Z79899 Other long term (current) drug therapy: Principal | ICD-10-CM

## 2021-01-31 MED ORDER — LITHIUM CARBONATE ER 450 MG TABLET,EXTENDED RELEASE
ORAL_TABLET | 0 refills | 0 days | Status: CP
Start: 2021-01-31 — End: ?

## 2021-01-31 NOTE — Unmapped (Signed)
Follow-up instructions:  -- Please continue taking your medications as prescribed for your mental health.   -- Do not make changes to your medications, including taking more or less than prescribed, unless under the supervision of your physician. Be aware that some medications may make you feel worse if abruptly stopped  -- Please refrain from using illicit substances, as these can affect your mood and could cause anxiety or other concerning symptoms.   -- Seek further medical care for any increase in symptoms or new symptoms such as thoughts of wanting to hurt yourself or hurt others.     Contact info:  Life-threatening emergencies: call 911 or go to the nearest ER for medical or psychiatric attention.     Issues that need urgent attention but are not life threatening: call the clinic outpatient frontdesk at [phone] for assistance.     Non-urgent routine concerns, questions, and refill requests: send a mychart message or call the front desk at [phone] and I will get back to you within 2 business days.     Regarding appointments:  - If you need to cancel your appointment, we ask that you call [phone] at least 24 hours before your scheduled appointment.  - If for any reason you arrive 15 minutes later than your scheduled appointment time, you may not be seen and your visit may be rescheduled.  - Please remember that we will not automatically reschedule missed appointments.  - If you miss two (2) appointments without letting us know in advance, you will likely be referred to a provider in your community.  - We will do our best to be on time. Sometimes an emergency will arise that might cause your clinician to be late. We will try to inform you of this when you check in for your appointment. If you wait more than 15 minutes past your appointment time without such notice, please speak with the front desk staff.    In the event of bad weather, the clinic staff will attempt to contact you, should your appointment need to be rescheduled. Additionally, you can call the Patient Weather Line [phone] for system-wide clinic status    For more information and reminders regarding clinic policies (these were provided when you were admitted to the clinic), please ask the front desk.

## 2021-01-31 NOTE — Unmapped (Signed)
[facility]  Psychiatry   Comprehensive New Patient Clinical Assessment - Outpatient      Assessment / Case Formulation:     Nicholas [name] presents for establishment of care. He has previously been seen by Dr. Maricela [name], but would like to transition care as his previous provider is no longer accepting his insurance. He has a history or Bipolar I Disorder that was diagnosed in 2003. He is currently prescribed vraylar, lithium, doxepin, and xanax with fairly good efficacy and tolerability. Today, we reviewed his current symptoms, psychiatric history, past treatment and medical history. At this time, he reports that his mood is euthymic and denies significant symptoms of depression or mania at present. He does note some sleep disturbance 2/2 getting up multiple times each night to urinate or drink liquids. He has tried to limit liquids and reports this has been ongoing for ~3 years. Given overall good symptomatic control and tolerability of current medication regimen, will plan to continue medications as currently prescribed for the time being. Will work on getting collateral and/or records from previous psychiatric provider. Will also get updated lithium and antipsychotic monitoring labs. Given his age and risk for adverse effects, we did discuss possibility of working towards minimizing or eliminating benzodiazepine use in the future. Would also likely consider limiting or discontinuing doxepin use given risk of cognitive issues w/ anticholinergic medication use (may also be contributing to dry mouth). We will plan to meet again in about ~1 month in person. May need to get in touch before then for help with financial assistance for vraylar.    Risk Assessment:  A suicide and violence risk assessment was performed as part of this evaluation. There patient is deemed to be at chronic elevated risk for self-harm/suicide given the following factors: male age >65, recent onset of serious medical condition, chronic severe medical condition and chronic mental illness > 5 years. The patient is deemed to be at chronic elevated risk for violence given the following factors: male gender. These risk factors are mitigated by the following factors:lack of active SI/HI, no history of previous suicide attempts , utilization of positive coping skills, supportive family, presence of a significant relationship, presence of an available support system, enjoyment of leisure actvities, current treatment compliance, effective problem solving skills, safe housing, support system in agreement with treatment recommendations and presence of a safety plan with follow-up care. There is no acute risk for suicide or violence at this time. The patient was educated about relevant modifiable risk factors including following recommendations for treatment of psychiatric illness and abstaining from substance abuse.   While future psychiatric events cannot be accurately predicted, the patient does not currently require  acute inpatient psychiatric care and does not currently meet [facility] involuntary commitment criteria.         Plan (including recommendations for additional assessments, services, or support):  #Bipolar I Disorder:  --Continue Lithium ER 450 mg QAM, 900 mg at bedtime. Will recheck level with labs this Thursday  --Continue Vraylar 3 mg nightly  --Continue Xanax 0.5 mg at bedtime PRN, consider tapering in the future given risks. Will defer for now. Reviewed PDMP today w/ appropriate fills. Does not need refill today. Taking primarily for sleep.  --Continue Doxepin 6 mg nightly, also possibly one to taper     #Medication monitoring:   --reviewed most recent Cr, HbA1c today  --Does not have recent lipid panel, Lithium level or TSH, ordered today.    Patient appears to have  the ability and capacity to respond to treatment, including patient or guardian understanding the treatment plan.  Patient has been given this writer's contact information as well as the [facility] Psychiatry urgent line number. They have been instructed to call 911 for emergencies.    Subjective:    Psychiatric Chief Concern:  Initial evaluation    HPI: Patient is a 65 y.o., White or Caucasian race, Not Hispanic or Latino ethnicity,  ENGLISH speaking male  with a history of bipolar 1 disorder, non-alcoholic cirrhosis, diabetes, and spinal stenosis.     Partner Nicholas [name] is present during the visit. He states I mostly want to get help w/ the drugs I'm taking and what they're doing and if they are the best ones to help with what I have. He states he is leaving his current psychiatrist because they do not accept insurance. He reports he is part of the [facility] PAP program. He is hoping to get on myabbvieassist for his vraylar. He states that he is taking it for mood stabilization and for sleep. He thinks the vraylar is helping with that.    Current medications: Vraylar 3 mg nightly, Xanax 0.5 mg nightly PRN (only takes at night), doxepin 6 mg QHS, Lithium ER 450 mg, 900 at night, 10 mg nightly.    He states he took venlafaxine in the past for 10 years. He states that he did find it helpful. He states that he has had mania in the past that can last months or up to a year. He does have depression at times. His partner shared that he has not had symptoms of psychosis in the past.     He does have dry mouth and drinks excessively at times. They have tried to cut back on fluid intake. He has had wt gain and has also been less active. They have been trying to eat healthfully.    An states his mood has been pretty good. He doesn't  feel like he is depressed or manic at this time. He states that he will sleep excessively. His partner shares that he thinks his mood has been really good lately. His partner shares that he can be irritable when he is manic. He states that he will wake up 2-3 times during the night, but can go back to sleep easily. He often gets up in the night to urinate or to drink. He gets to sleep easily. He thinks he has been drinking excessively and has been ongoing for ~3 years. He has tried a lot of things to help. Didn't seem to have this issue when he took risperdal. He has cut out sodas. Nicholas [name] said he has had struggled with grief this year d/t their dog dying.     Chart Review: Does not appear to have any contact w/ [facility] psychiatry in the past. PDMP shows recent fills for lomotil. Does have Alprazolam fills from 8/3, 4/28, 2/25, 1/19 for #60 for 30 days. He does have 2x fills within a month in November 2021. Med history includes vraylar 1.5 mg daily with fills as recent as 11/23/20, Doxepin 6 mg tablets (last fill #30 on 11/01/20), Lithium 450 mg ER 3 per day (last filled for 3 day supply on 01/18/21). Past fills for gabapentin, but not since 03/09/20. Effexor filled in 2021 as well. In new patient packet, patient indicated history of bipolar disorder and med list including doxepin 3 mg daily, xanax 0.25 mg daily PRN, lithium 600 mg BID, glipizide 5 mg daily, and lomotil.  Patient's psychiatric provider is Nicholas [name] from [facility]. Indicated psychiatric hospitalization for mania in 2011 (for ~2 months) at [facility] in [place], [place]. He denied any substance use.    Collateral: I attempted to reach patient's previous psychiatrist, Dr. Maricela [name] at [phone]. Will await return call for collateral.     Psychiatric history:   Diagnoses: Bipolar Disorder Type 1 (dx in 2003)  SA/SIB: Denies  Hospitalizations: 2011 for mania (was spending, traveling, giving money away)   Past medication trials: risperidone (worked well for him, helped w/ depression, took [place] and pills, stopped d/t concern it would be bad w/ the liver dz), seroquel (bad reaction to it), venlafaxine, abilify (concern for TD), lamictal (facial rash),   Family psychiatric history: No known, maybe mental illness in grandfather    Social History: [place] is retired. He works on Production designer, theatre/television/film, buys things from [employer], he has 2 dauchsunds he cares for. They moved in February 2021. Sister in [place] who he is close with, his brother is in [place] (not very close with). He spends a lot of time by himself.  Trauma: No    Substance Use:   Alcohol Use: None, has not drank since he was dx with cirrhosis (at least for 15 years), Has never had problems with it in the past.   Other Substances: None    Medical History: Non-alcoholic cirrhosis of the liver, spinal stenosis, diabetes (on glipizide), diarrhea, has had significant dental problems  Medications: Vemlidy 25 mg daily, glipizide 5 mg daily, nadolol 20 mg, pancreaze, lomotil    Medical/Surgical History:  No past medical history on file.    No past surgical history on file.    Social History:  Social History     Socioeconomic History   ??? Marital status: Single   Tobacco Use   ??? Smoking status: Never Smoker   ??? Smokeless tobacco: Never Used   Substance and Sexual Activity   ??? Alcohol use: No     Alcohol/week: 0.0 standard drinks   ??? Drug use: No       Family History:  The patient's family history is not on file..    Objective:     Vitals:   There were no vitals filed for this visit.    Mental Status Exam:  Appearance:    Appears stated age, Well nourished, Well developed and Clean/Neat   Motor:   No abnormal movements   Speech/Language:    Normal rate, volume, tone, fluency and Language intact, well formed   Mood:   OK   Affect:   Euthymic   Thought process:   Logical, linear, clear, coherent, goal directed   Thought content:     Denies SI, seeming optimistic and future oriented on interview. No bizarre or delusional thought content on interview. He does report dealing w/ grief over the death of his dog.   Perceptual disturbances:     Not endorsing any AVH, behavior not concerning for RIS     Orientation:   Oriented to person, place, time, and general circumstances   Attention:   Able to fully attend without fluctuations in consciousness   Concentration:   Able to fully concentrate and attend   Memory:   Immediate, short-term, long-term, and recall grossly intact    Fund of knowledge:    Consistent with level of education and development   Insight:     Intact   Judgment:    Intact   Impulse Control:   Intact  The patient reports they are currently: at home. I spent 65 minutes on the real-time audio and video with the patient on the date of service. I spent an additional 20 minutes on pre- and post-visit activities on the date of service.     The patient was physically located in [place] or a state in which I am permitted to provide care. The patient and/or parent/guardian understood that s/he may incur co-pays and cost sharing, and agreed to the telemedicine visit. The visit was reasonable and appropriate under the circumstances given the patient's presentation at the time.    The patient and/or parent/guardian has been advised of the potential risks and limitations of this mode of treatment (including, but not limited to, the absence of in-person examination) and has agreed to be treated using telemedicine. The patient's/patient's family's questions regarding telemedicine have been answered.     If the visit was completed in an ambulatory setting, the patient and/or parent/guardian has also been advised to contact their provider???s office for worsening conditions, and seek emergency medical treatment and/or call 911 if the patient deems either necessary.        Dennison Bulla, MD

## 2021-02-10 NOTE — Unmapped (Signed)
Updated myAbbVie Assist Patient Assistance form and Building services engineer Encounter notes uploaded to [employer] tab.

## 2021-02-10 NOTE — Unmapped (Signed)
Clinic received a call from a provider a Printmaker to speak with provider to correspond care for patient. They are faxing over office notes from patients visit and would like to speak with provider at the earliest convenience. The contact name is [place] and best contact number is [phone]. Please advise.

## 2021-02-11 NOTE — Unmapped (Signed)
Spoke with [employer] at [facility] (previous providers): She gave some background on Mr. Paradiso. His mood has been down related to life stressors. He has chronic insomnia. Has used a lot of benadryl in the past which caused anticholinergic SE. Currently on doxepin which has helped w/ sleep. He is taking lithium. They had reduced lithium d/t age. Never has had issues w/ lithium. Seroquel caused new onset diabetes. She thinks medicare will pay for [employer]. Wonders if he can get it through insurance with a prior authorization. He has had multiple failed med trials in the past. He has had tardive dyskinesia, but has never found it bothersome so they have never treated it. They can provide some Vraylar samples for a few months if needed. No other major concerns. They have also sent his medical records over.

## 2021-02-14 ENCOUNTER — Ambulatory Visit: Admit: 2021-02-14 | Discharge: 2021-02-15 | Payer: MEDICARE

## 2021-02-14 MED ADMIN — gadobutrol 1 mmol/mL intravenous solution/syringe: .1 mL/kg | INTRAVENOUS | @ 16:00:00 | Stop: 2021-02-14

## 2021-02-17 MED ORDER — ALPRAZOLAM 0.5 MG TABLET
ORAL_TABLET | Freq: Two times a day (BID) | ORAL | 0 refills | 15.00000 days | Status: CP | PRN
Start: 2021-02-17 — End: ?

## 2021-02-18 NOTE — Unmapped (Signed)
[facility] Refill Coordination Note    Specialty Medication(s) to be Shipped:   Infectious Disease: Vemlidy    Other medication(s) to be shipped: No additional medications requested for fill at this time     Nicholas [name], DOB: 10/11/1955  Phone: [phone] (home) [phone] (work)      All above HIPAA information was verified with patient.     Was a Nurse, learning disability used for this call? No    Completed refill call assessment today to schedule patient's medication shipment from the [facility] [phone]).  All relevant notes have been reviewed.     Specialty medication(s) and dose(s) confirmed: Regimen is correct and unchanged.   Changes to medications: Nicholas [name] reports no changes at this time.  Changes to insurance: No  New side effects reported not previously addressed with a pharmacist or physician: None reported  Questions for the pharmacist: No    Confirmed patient received a Conservation officer, historic buildings and a Surveyor, mining with first shipment. The patient will receive a drug information handout for each medication shipped and additional FDA Medication Guides as required.       DISEASE/MEDICATION-SPECIFIC INFORMATION        N/A    SPECIALTY MEDICATION ADHERENCE     Medication Adherence    Patient reported X missed doses in the last month: 0  Specialty Medication: VEMLIDY 25 mg Tab tablet (tenofovir alafenamide)  Patient is on additional specialty medications: No  Patient is on more than two specialty medications: No  Any gaps in refill history greater than 2 weeks in the last 3 months: no  Demonstrates understanding of importance of adherence: yes  Informant: patient  Reliability of informant: reliable  Confirmed plan for next specialty medication refill: delivery by pharmacy  Refills needed for supportive medications: not needed              Were doses missed due to medication being on hold? No    VEMLIDY 25 mg Tab: 8 days of medicine on hand       REFERRAL TO PHARMACIST     Referral to the pharmacist: Not needed      [facility]     Shipping address confirmed in Epic.     Delivery Scheduled: Yes, Expected medication delivery date: 02/24/21.     Medication will be delivered via UPS to the prescription address in Epic WAM.    [name]   [facility] Specialty Technician

## 2021-02-23 MED FILL — VEMLIDY 25 MG TABLET: ORAL | 30 days supply | Qty: 30 | Fill #1

## 2021-03-10 ENCOUNTER — Ambulatory Visit
Admit: 2021-03-10 | Discharge: 2021-03-11 | Payer: MEDICARE | Attending: Student in an Organized Health Care Education/Training Program | Primary: Student in an Organized Health Care Education/Training Program

## 2021-03-10 NOTE — Unmapped (Addendum)
Follow-up instructions:  --Decrease Xanax to 0.25 mg at bedtime  --Get lithium level, lipid level at [facility] lab.  --If you feel it would be helpful, you can look at therapists on psychologytoday.com (should be able to sort by insurance type).   -- Please continue taking your medications as prescribed for your mental health.   -- Do not make changes to your medications, including taking more or less than prescribed, unless under the supervision of your physician. Be aware that some medications may make you feel worse if abruptly stopped  -- Please refrain from using illicit substances, as these can affect your mood and could cause anxiety or other concerning symptoms.   -- Seek further medical care for any increase in symptoms or new symptoms such as thoughts of wanting to hurt yourself or hurt others.     Contact info:  Life-threatening emergencies: call 911 or go to the nearest ER for medical or psychiatric attention.     Issues that need urgent attention but are not life threatening: call the clinic outpatient frontdesk at [phone] for assistance.     Non-urgent routine concerns, questions, and refill requests: please send me a mychart message and I will get back to you within 2 business days. If you prefer to call, please call the front desk at [phone].    Regarding appointments:  - If you need to cancel your appointment, we ask that you call [phone] at least 24 hours before your scheduled appointment.  - If for any reason you arrive 15 minutes later than your scheduled appointment time, you may not be seen and your visit may be rescheduled.  - Please remember that we will not automatically reschedule missed appointments.  - If you miss two (2) appointments without letting us know in advance, you will likely be referred to a provider in your community.  - We will do our best to be on time. Sometimes an emergency will arise that might cause your clinician to be late. We will try to inform you of this when you check in for your appointment. If you wait more than 15 minutes past your appointment time without such notice, please speak with the front desk staff.    In the event of bad weather, the clinic staff will attempt to contact you, should your appointment need to be rescheduled. Additionally, you can call the Patient Weather Line [phone] for system-wide clinic status    For more information and reminders regarding clinic policies (these were provided when you were admitted to the clinic), please ask the front desk.

## 2021-03-10 NOTE — Unmapped (Signed)
[facility]  Psychiatry   Established Patient E&M Service - Outpatient       Assessment:    Nicholas [name] presents for follow-up evaluation. Since his last visit, we have received records from past provider and been able to review them. He presented today with his partner and reports that he is feeling a bit down. He states his mood has been blas?? and he is feeling tired, less energetic, unmotivated, and seems to be enjoying things less. We reviewed med list and discussed the psychosocial factors that seem to be contributing to his depression at present. Some of them are their relatively recent move from [facility], spending the day alone most of the time, feeling isolated/not being plugged in with the community in their new home ([place], [place]). He also notes that one thing that has gotten in the way of being more active/involved has been feeling unsteady on his feet. We discussed ways to increase social connection, physical activity and setting small goals for himself. I also suggested reaching out to PCP for referral to physical therapy to help him with his ambulation. We did discuss possibility of staying psychotherapy to help w/ coping, managing stressors. However, he prefers to defer for now. Patient and partner did inquire about getting Nicholas [name] back onto risperidone which he reportedly did quite well on in the past. We discussed some of the risks/benefits of this (primarily the risk being of destabilizing him given he has done relatively well on current regimen) and pt and partner were agreeable to him continuing Vraylar for now. Of note, would likely want to double check on/potentially discuss safety of risperidone in liver dz w/ pt's liver doctor prior to making the change in the future ([facility] suggests lower max dose in pts with liver dz). Of note, Nicholas [name] is currently sleeping fairly well and may be sleeping in excess. Given this and the long-term risks w/ BZD use (especially given his history of falls), we discussed decreasing dose of Xanax. Pt was open to this. Did also discuss that we would plan for very slow taper given chronicity of BZD use. Might also consider tapering doxepin in the future given that can also have risks for cognitive effects and falls. We will plan for follow-up in 1 month. Discussed need for lithium level and other monitoring labs.     Identifying Information:  Nicholas [name] is a 65 y.o. male with a history of  Bipolar I Disorder that was diagnosed in 2003. He established with this clinic in September 2022. At that time, he was prescribed vraylar, lithium, doxepin, and xanax with fairly good efficacy and tolerability.     Risk Assessment:  A full psychiatric risk assessment was conducted on 01/31/21 and risks do not appear significantly changed from that visit.   While future psychiatric events cannot be accurately predicted, the patient does not currently require acute inpatient psychiatric care and does not currently meet [facility] involuntary commitment criteria.      Plan:    #Bipolar I Disorder:  --Continue Lithium ER 450 mg QAM, 900 mg at bedtime. Will recheck level with labs soon.   --Continue Vraylar 3 mg nightly   --DECREASE Xanax to 0.25 mg at bedtime PRN (d 11/3) Reviewed PDMP today w/ appropriate fills. Would anticipate prolonged tapering given chronicity of BZD use.  --Continue Doxepin 6 mg nightly, also possibly one to taper   --Discussed behavioral activation today, Nicholas [name] planning to try and get more plugged in with his local  community (maybe a church).  --Recommend discussing PT with PCP to help w/ his unsteadiness on feet/hoping this could increase activity level.  --Consider referral for therapy (he is not interested today).  ??  #Medication monitoring:   --reviewed most recent Cr, HbA1c today  --Does not have recent lipid panel, Lithium level or TSH, ordered previously. Discussed today, they will go to [facility] lab to have collected soon.         Psychotherapy provided:  Services provided: 30 minutes of outpatient, in-office, face-to-face, individual supportive therapy and individual motivational enhancement therapy targeting increased physical activity/behavioral activation, assessing contributing factors to current depressive episode.  Patient was actively engaged in these interventions.  Regarding progress towards these treatment goals, patient was receptive.     Patient has been given this writer's contact information as well as the [facility] Psychiatry urgent line number. The patient has been instructed to call 911 for emergencies.    Subjective:    Interval History: States he gets a little depressed sometimes. He has diarrhea a few times per week. He hasn't been too involved in the community. Over a decade ago he was on risperidone (including an LAI). They said he did well on risperidone. He was taken off. He was stable on that.     Nicholas [name] feels like Nicholas [name] is not depressed, but says he is Blase. He is monotone with life. He thinks before they left [place] he was manic.     He does not eat too much. He drinks a lot of liquid. He is not doing very much lately. He is feeling fatigued. He is not feeling very motivated. He is unsteady on his feet when he walks.    Sometimes he will feel lonely during the day. They are going to look at a church in [place].      He sleeps a lot recently. He doesn't sleep deeply. He will sleep 1-2 hours at a time, goes to the bathroom. Gets back to sleep easily when he does get to sleep.     Discussed possibility of therapy.     We discussed POC as detailed in assessment. No other major Q's or concerns today. Planning for f/u via video in 1 month.      Chart Review: Received records from prior psychiatric provider. Notes TD w/ abilify, Lamictal causing hives/rash in the past, Invega causing TD. Note from 10/20/20 with concern for recent depression in context of psychosocial stressors.    Objective:    Mental Status Exam:  Appearance:    Appears stated age, Well nourished, Well developed and Clean/Neat   Motor:   No abnormal movements, some unsteadiness of gait observed, patient walked slowly   Speech/Language:    Normal rate, volume, tone, fluency and Language intact, well formed   Mood:   Blase   Affect:   Constricted and brighter and laughing at times   Thought process and Associations:   Logical, linear, clear, coherent, goal directed   Abnormal/psychotic thought content:     Does not endorse any SI. Just feeling more down, less motivated and enthusiatic. Notes he is sometimes lonely during the day when partner is working. No bizarre or delusional ideation noted on interview.   Perceptual disturbances:     He is not endorsing any AVH. No RIS on interview.     Other:            Visit was completed face to face.  Dennison Bulla, MD

## 2021-03-17 ENCOUNTER — Telehealth: Payer: Self-pay | Admitting: Medical

## 2021-03-17 NOTE — Telephone Encounter (Signed)
I attempted to leave message for patient to call back and schedule Medicare Annual Wellness Visit (AWV) in office. No voice mail.  If not able to come in office, please offer to do virtually or by telephone.  Left office number and my jabber [phone].  AWVI eligible as of 02/05/2021  Please schedule at anytime with Nurse Health Advisor.

## 2021-03-20 MED ORDER — ALPRAZOLAM 0.5 MG TABLET
ORAL_TABLET | Freq: Two times a day (BID) | ORAL | 0 refills | 15 days | PRN
Start: 2021-03-20 — End: ?

## 2021-03-21 MED ORDER — ALPRAZOLAM 0.5 MG TABLET
ORAL_TABLET | Freq: Two times a day (BID) | ORAL | 0 refills | 15 days | Status: CP | PRN
Start: 2021-03-21 — End: ?

## 2021-03-22 NOTE — Unmapped (Signed)
[facility] Pharm[facility] Refill Coordination Note    Specialty Medication(s) to be Shipped:   Infectious Disease: Vemlidy    Other medication(s) to be shipped: No additional medications requested for fill at this time     Nicholas [name], DOB: 1956-04-01  Phone: [phone] (home) [phone] (work)      All above HIPAA information was verified with patient.     Was a Nurse, learning disability used for this call? No    Completed refill call assessment today to schedule patient's medication shipment from the [facility] [phone]).  All relevant notes have been reviewed.     Specialty medication(s) and dose(s) confirmed: Regimen is correct and unchanged.   Changes to medications: [name] reports no changes at this time.  Changes to insurance: No  New side effects reported not previously addressed with a pharmacist or physician: None reported  Questions for the pharmacist: No    Confirmed patient received a Conservation officer, historic buildings and a Surveyor, mining with first shipment. The patient will receive a drug information handout for each medication shipped and additional FDA Medication Guides as required.       DISEASE/MEDICATION-SPECIFIC INFORMATION        N/A    SPECIALTY MEDICATION ADHERENCE     Medication Adherence    Patient reported X missed doses in the last month: 0  Specialty Medication: Vemlidy 25mg   Patient is on additional specialty medications: No        Were doses missed due to medication being on hold? No    Vemlidy 25 mg: 9 days of medicine on hand     REFERRAL TO PHARMACIST     Referral to the pharmacist: Not needed      [facility]     Shipping address confirmed in Epic.     Delivery Scheduled: Yes, Expected medication delivery date: 03/25/2021.     Medication will be delivered via UPS to the prescription address in Epic WAM.    Lorelei Pont [facility] Specialty Technician

## 2021-03-24 MED FILL — VEMLIDY 25 MG TABLET: ORAL | 30 days supply | Qty: 30 | Fill #2

## 2021-03-27 MED ORDER — LITHIUM CARBONATE ER 450 MG TABLET,EXTENDED RELEASE
ORAL_TABLET | 0 refills | 0 days
Start: 2021-03-27 — End: ?

## 2021-03-28 MED ORDER — LITHIUM CARBONATE ER 450 MG TABLET,EXTENDED RELEASE
ORAL_TABLET | 0 refills | 0 days | Status: CP
Start: 2021-03-28 — End: ?

## 2021-03-28 MED ORDER — DOXEPIN 6 MG TABLET
ORAL_TABLET | Freq: Every evening | ORAL | 0 refills | 30 days | Status: CP
Start: 2021-03-28 — End: 2021-04-27

## 2021-04-06 NOTE — Unmapped (Signed)
left msg to see if pt would like to keep 12/5 appt if changed to video w/callback number for confirmation

## 2021-04-11 ENCOUNTER — Telehealth
Admit: 2021-04-11 | Discharge: 2021-04-12 | Payer: MEDICARE | Attending: Student in an Organized Health Care Education/Training Program | Primary: Student in an Organized Health Care Education/Training Program

## 2021-04-11 DIAGNOSIS — F319 Bipolar disorder, unspecified: Secondary | ICD-10-CM | POA: Diagnosis not present

## 2021-04-11 NOTE — Unmapped (Signed)
[facility]  Psychiatry   Established Patient E&M Service - Outpatient       Assessment:    Nicholas [name] presents for follow-up evaluation. Today, Nicholas [name] is fairly stable psychiatrically. Mood is OK. Maybe slightly improved from prior. Sleep is a little worse in the context of decreasing xanax dose, but overall adequate. No other effects identified from lowering xanax dose. Denies any sx of mania. Spoke about behavioral activation again today and encouraged Nicholas [name] to work on engaging in community and consider getting into PT to help w/ mobility. Reports medications are having fair efficacy and tolerability. Given stability, will plan for continuing medications as currently prescribed. Did discuss that ultimately it may be beneficial to continue taper of Xanax and potentially lowering or eliminating doxepin as well given risk for long term SE (inlcuding cognitive impairment). However, will defer for now given some recent sleep worsening and given recent reduction in xanax dose. Did discuss need for lithium monitoring. No other issues today. Will plan for f/u in ~1 month.    Identifying Information:  Nicholas [name] is a 65 y.o. male with a history of  Bipolar I Disorder that was diagnosed in 2003. He established with this clinic in September 2022. At that time, he was prescribed vraylar, lithium, doxepin, and xanax with fairly good efficacy and tolerability.     Risk Assessment:  A full psychiatric risk assessment was conducted on 01/31/21 and risks do not appear significantly changed from that visit.   While future psychiatric events cannot be accurately predicted, the patient does not currently require acute inpatient psychiatric care and does not currently meet [facility] involuntary commitment criteria.      Plan:    #Bipolar I Disorder:  --Continue Lithium ER 450 mg QAM, 900 mg at bedtime. Will recheck level with labs soon.   --Continue Vraylar 3 mg nightly   --Continue Xanax to 0.25 mg at bedtime PRN (d 11/3) Reviewed PDMP today w/ appropriate fills. Would anticipate prolonged tapering given chronicity of BZD use.  --Continue Doxepin 6 mg nightly, also possibly one to taper   --Discussed behavioral activation today, Aaro planning to try and get more plugged in with his local community (maybe a church).  --Recommend discussing PT with PCP to help w/ his unsteadiness on feet/hoping this could increase activity level.  --Consider referral for therapy, previously not interested and did not discuss again today.  ??  #Medication monitoring:   --reviewed most recent Cr, HbA1c today  --Does not have recent lipid panel, Lithium level or TSH, ordered previously. Discussed today, planning to call [facility].     Psychotherapy provided:  No billable psychotherapy service provided but brief supportive therapy was utilized.    Patient has been given this writer's contact information as well as the [facility] Psychiatry urgent line number. The patient has been instructed to call 911 for emergencies.    Subjective:    Interval History: Pt states his mood is pretty good. He had family visit for Thanksgiving. They are looking forward to Christmas. He notes they are still working on their house. He states that his sleep is pretty good. Husband gets up at 4:15 AM, he gets up w/ them and then goes back to sleep. Reports that he has had a little harder time falling asleep since decreasing Xanax. Denies any other changes with decreased Xanax dosage. States that he still feels blah at times. Reports that he is going to [employer] and is looking forward to that.  Feels like his mood has been a little bit better since the last time we met. They are planning to get connected w/ the [employer]. He reports that he hasn't done too much w/ his time. He is planning for Christmas. States that the house being worked on all the time is a stressor. 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 He notes that if he oversleeps he will feel more down.     Doxepin is expensive. Lithium and vraylar are going well. Denies any medication SE.     Diarrhea is still bothering him and can be difficult to control. Notes diarrhea sometimes can get in the way of wanting to go out and do things.     States that memory is OK. He states that sometimes he will feel foggy and less alert at times. Denies any functional impairment from memory concerns.     Still taking melatonin.     States that mood has been stable.     Discussed POC. See assessment above for details. No other Q's or concerns today. Have f/u appts scheduled.  Objective:    Mental Status Exam:  Appearance:    Appears stated age, Well nourished, Well developed and Clean/Neat   Motor:   No abnormal movements   Speech/Language:    Normal rate, volume, tone, fluency and Language intact, well formed   Mood:   OK   Affect:   Constricted and brighter and laughing at times   Thought process and Associations:   Logical, linear, clear, coherent, goal directed   Abnormal/psychotic thought content:     Does not endorse any SI. Mood OK, but motivation still lower. No bizarre or delusional ideation noted on interview.   Perceptual disturbances:     He is not endorsing any AVH. No RIS on interview.     Other:            Visit was completed by video (or phone) and the appropriate disclaimer has been included below.    I personally spent 45 minutes face-to-face and non-face-to-face in the care of this patient, which includes all pre, intra, and post visit time on the date of service.        The patient reports they are currently: at home. I spent 35 minutes on the real-time audio and video with the patient on the date of service. I spent an additional 10 minutes on pre- and post-visit activities on the date of service.     The patient was physically located in [place] or a state in which I am permitted to provide care. The patient and/or parent/guardian understood that s/he may incur co-pays and cost sharing, and agreed to the telemedicine visit. The visit was reasonable and appropriate under the circumstances given the patient's presentation at the time.    The patient and/or parent/guardian has been advised of the potential risks and limitations of this mode of treatment (including, but not limited to, the absence of in-person examination) and has agreed to be treated using telemedicine. The patient's/patient's family's questions regarding telemedicine have been answered.     If the visit was completed in an ambulatory setting, the patient and/or parent/guardian has also been advised to contact their provider???s office for worsening conditions, and seek emergency medical treatment and/or call 911 if the patient deems either necessary.            Dennison Bulla, MD

## 2021-04-19 NOTE — Unmapped (Signed)
[facility] Refill Coordination Note    Specialty Medication(s) to be Shipped:   Infectious Disease: Vemlidy 25mg      Other medication(s) to be shipped: No additional medications requested for fill at this time     Nicholas [name], DOB: July 07, 1955  Phone: [phone] (home) [phone] (work)      All above HIPAA information was verified with patient.     Was a Nurse, learning disability used for this call? No    Completed refill call assessment today to schedule patient's medication shipment from the [facility] [phone]).  All relevant notes have been reviewed.     Specialty medication(s) and dose(s) confirmed: Regimen is correct and unchanged.   Changes to medications: Nicholas [name] reports no changes at this time.  Changes to insurance: No  New side effects reported not previously addressed with a pharmacist or physician: None reported  Questions for the pharmacist: No    Confirmed patient received a Conservation officer, historic buildings and a Surveyor, mining with first shipment. The patient will receive a drug information handout for each medication shipped and additional FDA Medication Guides as required.       DISEASE/MEDICATION-SPECIFIC INFORMATION        N/A    SPECIALTY MEDICATION ADHERENCE     Medication Adherence    Patient reported X missed doses in the last month: 0  Specialty Medication: Vemlidy 25mg   Patient is on additional specialty medications: No  Informant: patient        Were doses missed due to medication being on hold? No    Vemlidy 25 mg: 10 days of medicine on hand     REFERRAL TO PHARMACIST     Referral to the pharmacist: Not needed      [facility]     Shipping address confirmed in Epic.     Delivery Scheduled: Yes, Expected medication delivery date: 04/27/21.     Medication will be delivered via UPS to the prescription address in Epic WAM.    [name]   [facility] Specialty Technician

## 2021-04-26 MED FILL — VEMLIDY 25 MG TABLET: ORAL | 30 days supply | Qty: 30 | Fill #3

## 2021-04-29 ENCOUNTER — Telehealth: Payer: Self-pay | Admitting: Physical Medicine and Rehabilitation

## 2021-04-29 ENCOUNTER — Encounter: Payer: Self-pay | Admitting: Medical

## 2021-04-29 NOTE — Telephone Encounter (Signed)
Patient called. He would like an appointment with Dr. Ernestina Patches. His call back number is [phone]

## 2021-04-30 MED ORDER — DOXEPIN 6 MG TABLET
ORAL_TABLET | 0 refills | 0 days
Start: 2021-04-30 — End: ?

## 2021-05-03 MED ORDER — DOXEPIN 6 MG TABLET
ORAL_TABLET | 0 refills | 0 days | Status: CP
Start: 2021-05-03 — End: ?

## 2021-05-16 ENCOUNTER — Ambulatory Visit: Payer: Medicare HMO | Admitting: Nurse Practitioner

## 2021-05-16 DIAGNOSIS — E1165 Type 2 diabetes mellitus with hyperglycemia: Secondary | ICD-10-CM | POA: Diagnosis not present

## 2021-05-16 DIAGNOSIS — E559 Vitamin D deficiency, unspecified: Secondary | ICD-10-CM | POA: Diagnosis not present

## 2021-05-17 LAB — LIPID PANEL
Chol/HDL Ratio: 6.1 ratio — ABNORMAL HIGH (ref 0.0–5.0)
Cholesterol, Total: 164 mg/dL (ref 100–199)
HDL: 27 mg/dL — ABNORMAL LOW (ref >39)
LDL Chol Calc (NIH): 82 mg/dL (ref 0–99)
Triglycerides: 334 mg/dL — ABNORMAL HIGH (ref 0–149)
VLDL Cholesterol Cal: 55 mg/dL — ABNORMAL HIGH (ref 5–40)

## 2021-05-17 LAB — COMPREHENSIVE METABOLIC PANEL
ALT: 42 IU/L (ref 0–44)
AST: 28 IU/L (ref 0–40)
Albumin/Globulin Ratio: 1.8 (ref 1.2–2.2)
Albumin: 4.2 g/dL (ref 3.8–4.8)
Alkaline Phosphatase: 72 IU/L (ref 44–121)
BUN/Creatinine Ratio: 18 (ref 10–24)
BUN: 15 mg/dL (ref 8–27)
Bilirubin Total: 0.5 mg/dL (ref 0.0–1.2)
CO2: 23 mmol/L (ref 20–29)
Calcium: 9.1 mg/dL (ref 8.6–10.2)
Chloride: 105 mmol/L (ref 96–106)
Creatinine, Ser: 0.83 mg/dL (ref 0.76–1.27)
Globulin, Total: 2.4 g/dL (ref 1.5–4.5)
Glucose: 188 mg/dL — ABNORMAL HIGH (ref 70–99)
Potassium: 4.2 mmol/L (ref 3.5–5.2)
Sodium: 141 mmol/L (ref 134–144)
Total Protein: 6.6 g/dL (ref 6.0–8.5)
eGFR: 97 mL/min/{1.73_m2} (ref >59)

## 2021-05-17 LAB — TSH: TSH: 3.36 u[IU]/mL (ref 0.450–4.500)

## 2021-05-17 LAB — VITAMIN D 25 HYDROXY (VIT D DEFICIENCY, FRACTURES): Vit D, 25-Hydroxy: 56.7 ng/mL (ref 30.0–100.0)

## 2021-05-17 LAB — T4, FREE: Free T4: 0.91 ng/dL (ref 0.82–1.77)

## 2021-05-19 DIAGNOSIS — Z79899 Other long term (current) drug therapy: Principal | ICD-10-CM

## 2021-05-20 MED ORDER — ALPRAZOLAM 0.5 MG TABLET
ORAL_TABLET | Freq: Every evening | ORAL | 0 refills | 60 days | Status: CP | PRN
Start: 2021-05-20 — End: 2021-06-19

## 2021-05-24 NOTE — Unmapped (Signed)
[facility] Refill Coordination Note    Specialty Medication(s) to be Shipped:   Infectious Disease: Vemlidy    Other medication(s) to be shipped: No additional medications requested for fill at this time     Nicholas [name], DOB: June 25, 1955  Phone: [phone] (home) [phone] (work)      All above HIPAA information was verified with patient.     Was a Nurse, learning disability used for this call? No    Completed refill call assessment today to schedule patient's medication shipment from the [facility] [phone]).  All relevant notes have been reviewed.     Specialty medication(s) and dose(s) confirmed: Regimen is correct and unchanged.   Changes to medications: Aadon reports no changes at this time.  Changes to insurance: No  New side effects reported not previously addressed with a pharmacist or physician: None reported  Questions for the pharmacist: No    Confirmed patient received a Conservation officer, historic buildings and a Surveyor, mining with first shipment. The patient will receive a drug information handout for each medication shipped and additional FDA Medication Guides as required.       DISEASE/MEDICATION-SPECIFIC INFORMATION        N/A    SPECIALTY MEDICATION ADHERENCE     Medication Adherence    Patient reported X missed doses in the last month: 0  Specialty Medication: VEMLIDY 25 mg              Were doses missed due to medication being on hold? No    vemlidy 25mg   : 10 days of medicine on hand       REFERRAL TO PHARMACIST     Referral to the pharmacist: Not needed      [facility]     Shipping address confirmed in Epic.     Delivery Scheduled: Yes, Expected medication delivery date: 1/20.     Medication will be delivered via UPS to the prescription address in Epic WAM.    Westley [name]   [facility] Specialty Technician

## 2021-05-26 MED FILL — VEMLIDY 25 MG TABLET: ORAL | 30 days supply | Qty: 30 | Fill #4

## 2021-05-27 ENCOUNTER — Ambulatory Visit: Payer: Medicare HMO | Admitting: Nurse Practitioner

## 2021-05-30 ENCOUNTER — Telehealth: Payer: Self-pay | Admitting: Medical

## 2021-05-30 NOTE — Telephone Encounter (Signed)
Left message for patient to call back and schedule Medicare Annual Wellness Visit (AWV) in office.  ° °If not able to come in office, please offer to do virtually or by telephone.  Left office number and my jabber #[phone]. ° °Due for AWVI ° °Please schedule at anytime with Nurse Health Advisor. °  °

## 2021-06-06 ENCOUNTER — Ambulatory Visit: Admit: 2021-06-06 | Discharge: 2021-06-06 | Payer: MEDICARE

## 2021-06-06 DIAGNOSIS — Z79899 Other long term (current) drug therapy: Principal | ICD-10-CM

## 2021-06-06 LAB — THYROID FUNCTION CASCADE: THYROID STIMULATING HORMONE: 3.535 u[IU]/mL (ref 0.550–4.780)

## 2021-06-06 LAB — LIPID PANEL
CHOLESTEROL/HDL RATIO SCREEN: 4.1 (ref 1.0–4.5)
CHOLESTEROL: 136 mg/dL (ref <=200)
HDL CHOLESTEROL: 33 mg/dL — ABNORMAL LOW (ref 40–60)
LDL CHOLESTEROL CALCULATED: 67 mg/dL (ref 40–100)
NON-HDL CHOLESTEROL: 103 mg/dL (ref 70–130)
TRIGLYCERIDES: 181 mg/dL — ABNORMAL HIGH (ref 0–150)
VLDL CHOLESTEROL CAL: 36.2 mg/dL (ref 12–42)

## 2021-06-06 LAB — LITHIUM LEVEL: LITHIUM LEVEL: 0.8 mmol/L (ref 0.5–1.2)

## 2021-06-07 ENCOUNTER — Encounter: Payer: Self-pay | Admitting: Nurse Practitioner

## 2021-06-07 ENCOUNTER — Other Ambulatory Visit: Payer: Self-pay

## 2021-06-07 ENCOUNTER — Ambulatory Visit: Payer: Medicare HMO | Admitting: Nurse Practitioner

## 2021-06-07 VITALS — BP 101/62 | HR 57 | Ht 70.0 in | Wt 279.0 lb

## 2021-06-07 DIAGNOSIS — E1165 Type 2 diabetes mellitus with hyperglycemia: Secondary | ICD-10-CM

## 2021-06-07 DIAGNOSIS — E559 Vitamin D deficiency, unspecified: Secondary | ICD-10-CM | POA: Diagnosis not present

## 2021-06-07 DIAGNOSIS — E782 Mixed hyperlipidemia: Secondary | ICD-10-CM

## 2021-06-07 LAB — POCT GLYCOSYLATED HEMOGLOBIN (HGB A1C): HbA1c, POC (controlled diabetic range): 7.1 % — AB (ref 0.0–7.0)

## 2021-06-07 MED ORDER — DOXEPIN 6 MG TABLET
ORAL_TABLET | 0 refills | 0 days
Start: 2021-06-07 — End: ?

## 2021-06-07 NOTE — Patient Instructions (Signed)

## 2021-06-07 NOTE — Progress Notes (Signed)
Endocrinology Follow Up Note       06/07/2021, 11:32 AM   Subjective:    Patient ID: Angel [name], male    DOB: 1955-08-03.  Angel [name] is being seen in follow up after being seen in consultation for management of currently uncontrolled symptomatic diabetes requested by  [name], PA-C.   Past Medical History:  Diagnosis Date   Bipolar disorder ([place])    Cirrhosis of liver without mention of alcohol    Depression    GERD (gastroesophageal reflux disease)    Headache    Hepatitis B    Hepatitis E. 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 has lost 10 lbs since pre-visit, intentionally with diet and walking    Past Surgical History:  Procedure Laterality Date   APPENDECTOMY     COLONOSCOPY     POLYPECTOMY     UPPER GASTROINTESTINAL ENDOSCOPY      Social History   Socioeconomic History   Marital status: Single    Spouse name: [name]   Number of children: 0   Years of education: Not on file   Highest education level: Master's degree (e.g., MA, MS, MEng, MEd, MSW, MBA)  Occupational History   Occupation: retired    Fish farm manager: NOT EMPLOYED  Tobacco Use   Smoking status: Former    Types: Cigarettes    Quit date: 05/08/2009    Years since quitting: 12.0   Smokeless tobacco: Never  Vaping Use   Vaping Use: Never used  Substance and Sexual Activity   Alcohol use: No    Alcohol/week: 0.0 standard drinks   Drug use: No   Sexual activity: Not Currently  Other Topics Concern   Not on file  Social History Narrative   Patient is right-handed. He is married to same sex partner. He gets little exercise.   Caffeine: 4 glasses of tea max/day   Social Determinants of Health   Financial Resource Strain: Not on file  Food  Insecurity: Not on file  Transportation Needs: Not on file  Physical Activity: Not on file  Stress: Not on file  Social Connections: Not on file    Family History  Problem Relation Age of Onset   Heart disease Mother    Diabetes Father    Kidney failure Father    Kidney disease Father    Other Sister        GERD   Colon cancer Neg Hx    Rectal cancer Neg Hx    Stomach cancer Neg Hx    Esophageal cancer Neg Hx    Headache Neg Hx    Migraines Neg Hx     Outpatient Encounter Medications as of 06/07/2021  Medication Sig   ALPRAZolam (XANAX) 0.5 MG tablet Take 0.5 mg by mouth 2 (two) times daily as needed.   Blood Glucose Monitoring Suppl (ACCU-CHEK AVIVA PLUS) w/Device KIT Check sugar TID . Dx code:R73.9   diphenoxylate-atropine (LOMOTIL) 2.5-0.025 MG tablet Take 1 tablet by mouth  4 (four) times daily as needed for diarrhea or loose stools.   Doxepin HCl 6 MG TABS Take 1 tablet by mouth at bedtime.   glipiZIDE (GLUCOTROL XL) 5 MG 24 hr tablet TAKE 1 TABLET(5 MG) BY MOUTH DAILY WITH BREAKFAST   glucose blood (ONETOUCH VERIO) test strip Use as instructed to monitor glucose twice daily, before breakfast and before bed   Lancet Devices (LANCING DEVICE) MISC 1 Device by Does not apply route as directed. Compatible with onetouch ultrasoft lancets   lithium carbonate (ESKALITH) 450 MG CR tablet Take 1,400 mg by mouth daily.   Melatonin 10 MG SUBL Place 10 mg under the tongue at bedtime.   methocarbamol (ROBAXIN) 750 MG tablet Take 1 tablet (750 mg total) by mouth every 8 (eight) hours as needed for muscle spasms.   nadolol (CORGARD) 20 MG tablet Take 20 mg by mouth daily.   OneTouch Delica Lancets 16X MISC Use as instructed to monitor glucose twice daily   Pancrelipase, Lip-Prot-Amyl, (CREON PO) Take by mouth. 2400 units 8 times a day   Pancrelipase, Lip-Prot-Amyl, (PANCREAZE) 37000-97300 units CPEP    Tenofovir Alafenamide Fumarate (VEMLIDY) 25 MG TABS Take by mouth.   VRAYLAR 1.5 MG  capsule Take 1.5 mg by mouth daily.   BISACODYL 5 MG EC tablet Take 5 mg by mouth daily as needed. (Patient not taking: Reported on 06/07/2021)   No facility-administered encounter medications on file as of 06/07/2021.    ALLERGIES: Allergies  Allergen Reactions   Aripiprazole Other (See Comments)    Tardive Dyskinesia   Aripiprazole     Other reaction(s): Other (See Comments) Tardive Dyskinesia   Invega [Paliperidone Er] Other (See Comments)    Tardive dyskinsia symptoms   Quetiapine Diarrhea, Hives, Itching, Nausea And Vomiting, Nausea Only, Shortness Of Breath and Rash   Eszopiclone Other (See Comments)    Sleep walking   Escitalopram     Other reaction(s): Other (See Comments) Doesn't Work   Hydrocodone Itching   Hydrocodone Bit-Homatrop Mbr Rash   Lamictal [Lamotrigine] Rash   Lexapro [Escitalopram Oxalate] Other (See Comments)    Doesn't Work    VACCINATION STATUS: Immunization History  Administered Date(s) Administered   Influenza Split 04/11/2011   Influenza,inj,Quad PF,6+ Mos 04/09/2014, 01/19/2015, 03/27/2016, 02/27/2017, 03/14/2018, 01/02/2019, 02/26/2020   Influenza,inj,quad, With Preservative 03/21/2013   Influenza,trivalent, recombinat, inj, PF 04/11/2011   Pneumococcal Polysaccharide-23 03/21/2013   Tdap 10/07/2013   Zoster Recombinat (Shingrix) 01/02/2019, 03/04/2019   Zoster, Live 08/12/2015    Diabetes He presents for his follow-up diabetic visit. He has type 2 diabetes mellitus. The initial diagnosis of diabetes was made 2 months (Diagnosed approx 2 months ago) ago. His disease course has been stable. There are no hypoglycemic associated symptoms. Associated symptoms include foot paresthesias. Pertinent negatives for diabetes include no blurred vision, no fatigue, no foot ulcerations, no polydipsia, no polyuria and no weight loss. There are no hypoglycemic complications. Symptoms are stable. Diabetic complications include nephropathy and peripheral  neuropathy. Risk factors for coronary artery disease include diabetes mellitus, dyslipidemia, family history, male sex, obesity and sedentary lifestyle. Current diabetic treatment includes diet and oral agent (monotherapy). He is compliant with treatment most of the time. His weight is stable. He is following a generally healthy diet. Meal planning includes avoidance of concentrated sweets. He participates in exercise intermittently. His home blood glucose trend is fluctuating minimally. His breakfast blood glucose range is generally 130-140 mg/dl. His bedtime blood glucose range is generally 140-180 mg/dl. His overall blood  glucose range is 130-140 mg/dl. (He presents today, accompanied by his partner, with his logs and meter showing stable, near target fasting and postprandial glycemic profile.  His POCT A1c today is 7.1%, increasing slightly from last visit of 6.8%.  He denies any s/s of hypoglycemia.  He is eating well balanced meals and is exercising more but are concerned about his continued slow weight gain.  Analysis of his meter shows 7-day average of 135, 14-day average of 165, 30-day average of 171, 90-day average of 171.) An ACE inhibitor/angiotensin II receptor blocker is not being taken. He does not see a podiatrist.Eye exam is current.  Hyperlipidemia This is a chronic problem. The current episode started more than 1 year ago. The problem is uncontrolled. Recent lipid tests were reviewed and are variable. Exacerbating diseases include chronic renal disease, diabetes, liver disease and obesity. Factors aggravating his hyperlipidemia include beta blockers and fatty foods. He is currently on no antihyperlipidemic treatment. Compliance problems include adherence to exercise.  Risk factors for coronary artery disease include diabetes mellitus, dyslipidemia, family history, obesity, male sex and a sedentary lifestyle.    Review of systems  Constitutional: + slow increase in body weight,  current Body  mass index is 40.03 kg/m. , no fatigue, no subjective hyperthermia, no subjective hypothermia Eyes: no blurry vision, no xerophthalmia ENT: no sore throat, no nodules palpated in throat, no dysphagia/odynophagia, no hoarseness Cardiovascular: no chest pain, no shortness of breath, no palpitations Respiratory: no cough, no shortness of breath Gastrointestinal: no nausea/vomiting/, + persistent diarrhea (on creon for pancreatic exocrine insufficiency) Musculoskeletal: no muscle/joint aches Skin: no rashes, no hyperemia Neurological: no tremors, + numbness/tingling to BLE, no dizziness Psychiatric: no depression, no anxiety  Objective:     BP 101/62    Pulse (!) 57    Ht $R'5\' 10"'ak$  (1.778 m)    Wt 279 lb (126.6 kg)    SpO2 97%    BMI 40.03 kg/m   Wt Readings from Last 3 Encounters:  06/07/21 279 lb (126.6 kg)  01/12/21 273 lb 6.4 oz (124 kg)  10/06/20 273 lb (123.8 kg)     BP Readings from Last 3 Encounters:  06/07/21 101/62  01/12/21 97/61  10/06/20 (!) 149/60      Physical Exam- Limited  Constitutional:  Body mass index is 40.03 kg/m. , not in acute distress, normal state of mind Eyes:  EOMI, no exophthalmos Neck: Supple Cardiovascular: RRR, no murmurs, rubs, or gallops, no edema Respiratory: Adequate breathing efforts, no crackles, rales, rhonchi, or wheezing Musculoskeletal: no gross deformities, strength intact in all four extremities, no gross restriction of joint movements Skin:  no rashes, no hyperemia Neurological: no tremor with outstretched hands    CMP ( most recent) CMP     Component Value Date/Time   NA 141 05/16/2021 1336   K 4.2 05/16/2021 1336   CL 105 05/16/2021 1336   CO2 23 05/16/2021 1336   GLUCOSE 188 (H) 05/16/2021 1336   GLUCOSE 119 (H) 10/06/2020 1552   BUN 15 05/16/2021 1336   CREATININE 0.83 05/16/2021 1336   CREATININE 1.00 03/10/2020 1011   CALCIUM 9.1 05/16/2021 1336   PROT 6.6 05/16/2021 1336   ALBUMIN 4.2 05/16/2021 1336   AST 28  05/16/2021 1336   ALT 42 05/16/2021 1336   ALKPHOS 72 05/16/2021 1336   BILITOT 0.5 05/16/2021 1336   GFRNONAA 94 05/06/2019 1324   GFRNONAA 92 02/09/2017 1527   GFRAA 109 05/06/2019 1324   GFRAA 106 02/09/2017 1527  Diabetic Labs (most recent): Lab Results  Component Value Date   HGBA1C 7.1 (A) 06/07/2021   HGBA1C 6.8 (A) 01/12/2021   HGBA1C 8.4 (H) 08/09/2020     Lipid Panel ( most recent) Lipid Panel     Component Value Date/Time   CHOL 164 05/16/2021 1336   TRIG 334 (H) 05/16/2021 1336   HDL 27 (L) 05/16/2021 1336   CHOLHDL 6.1 (H) 05/16/2021 1336   CHOLHDL 5.4 (H) 02/10/2020 1054   VLDL 32.8 09/28/2016 1659   LDLCALC 82 05/16/2021 1336   LDLCALC 93 02/10/2020 1054   LABVLDL 55 (H) 05/16/2021 1336      Lab Results  Component Value Date   TSH 3.360 05/16/2021   TSH 0.72 11/28/2019   TSH 3.160 05/06/2019   TSH 2.43 10/11/2017   TSH 3.955 04/22/2015   TSH 2.87 10/07/2013   FREET4 0.91 05/16/2021           Assessment & Plan:   1) Uncontrolled Type 2 Diabetes with hyperglycemia  He presents today, accompanied by his partner, with his logs and meter showing stable, near target fasting and postprandial glycemic profile.  His POCT A1c today is 7.1%, increasing slightly from last visit of 6.8%.  He denies any s/s of hypoglycemia.  He is eating well balanced meals and is exercising more but are concerned about his continued slow weight gain.  Analysis of his meter shows 7-day average of 135, 14-day average of 165, 30-day average of 171, 90-day average of 171.  - Angel [name] has currently uncontrolled symptomatic type 2 DM since 66 years of age.  -Recent labs reviewed.  - I had a long discussion with him about the progressive nature of diabetes and the pathology behind its complications. -his diabetes is complicated by peripheral neuropathy and mild CKD and he remains at a high risk for more acute and chronic complications which include CAD, CVA, and  retinopathy. These are all discussed in detail with him.  - Nutritional counseling repeated at each appointment due to patients tendency to fall back in to old habits.  - The patient admits there is a room for improvement in their diet and drink choices. -  Suggestion is made for the patient to avoid simple carbohydrates from their diet including Cakes, Sweet Desserts / Pastries, Ice Cream, Soda (diet and regular), Sweet Tea, Candies, Chips, Cookies, Sweet Pastries, Store Bought Juices, Alcohol in Excess of 1-2 drinks a day, Artificial Sweeteners, Coffee Creamer, and "Sugar-free" Products. This will help patient to have stable blood glucose profile and potentially avoid unintended weight gain.   - I encouraged the patient to switch to unprocessed or minimally processed complex starch and increased protein intake (animal or plant source), fruits, and vegetables.   - Patient is advised to stick to a routine mealtimes to eat 3 meals a day and avoid unnecessary snacks (to snack only to correct hypoglycemia).  - he has been scheduled with Jearld Fenton, RDE for diabetes education.  - I have approached him with the following individualized plan to manage  his diabetes and patient agrees:   -Based on his stable glycemic profile, he is advised to continue Glipizide 5 mg XL daily with breakfast. May need basal insulin if cannot be controlled alone with oral meds.   -He is encouraged to continue monitoring blood glucose twice daily, before breakfast and before bed, and to call the clinic if he has readings less than 70 or greater than 300 for 3 tests in a row.  -  Specific targets for  A1c;  LDL, HDL,  and Triglycerides were discussed with the patient.  2) Blood Pressure /Hypertension:  his blood pressure is controlled to target.   he is advised to continue his current medications including Nadolol 20 mg p.o. daily with breakfast.  3) Lipids/Hyperlipidemia:    Review of his recent lipid panel from  06/06/21 showed controlled LDL at 67 and elevated triglycerides of 181-drastically improved.  he is not currently on any lipid lowering medications.  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    4)  Weight/Diet:  his Body mass index is 40.03 kg/m.  -  clearly complicating his diabetes care.   he is a candidate for weight loss. I discussed with him the fact that loss of 5 - 10% of his  current body weight will have the most impact on his diabetes management.  Exercise, and detailed carbohydrates information provided  -  detailed on discharge instructions.  5) Chronic Care/Health Maintenance: -he not on ACEI/ARB or Statin medications and is encouraged to initiate and continue to follow up with Ophthalmology, Dentist, Podiatrist at least yearly or according to recommendations, and advised to stay away from smoking. I have recommended yearly flu vaccine and pneumonia vaccine at least every 5 years; moderate intensity exercise for up to 150 minutes weekly; and sleep for at least 7 hours a day.  - he is advised to maintain close follow up with [place], Percell Miller, PA-C for primary care needs, as well as his other providers for optimal and coordinated care.       I spent 46 minutes in the care of the patient today including review of labs from [place], Lipids, Thyroid Function, Hematology (current and previous including abstractions from other facilities); face-to-face time discussing  his blood glucose readings/logs, discussing hypoglycemia and hyperglycemia episodes and symptoms, medications doses, his options of short and long term treatment based on the latest standards of care / guidelines;  discussion about incorporating lifestyle medicine;  and documenting the encounter.    Please refer to Patient Instructions for Blood Glucose Monitoring and Insulin/Medications Dosing Guide"  in media tab for additional information. Please  also refer to " Patient Self Inventory" in the Media  tab  for reviewed elements of pertinent patient history.  Angel [name] participated in the discussions, expressed understanding, and voiced agreement with the above plans.  All questions were answered to his satisfaction. he is encouraged to contact clinic should he have any questions or concerns prior to his return visit.   Follow up plan: - Return in about 4 months (around 10/05/2021) for Diabetes F/U with A1c in office, No previsit labs, Bring meter and logs.   Rayetta Pigg, [facility] [address] [place], [place] [address] Phone: [phone] Fax: [phone]  06/07/2021, 11:32 AM

## 2021-06-08 MED ORDER — DOXEPIN 6 MG TABLET
ORAL_TABLET | Freq: Every evening | ORAL | 0 refills | 30 days | Status: CP
Start: 2021-06-08 — End: ?

## 2021-06-10 MED ORDER — LITHIUM CARBONATE ER 450 MG TABLET,EXTENDED RELEASE
ORAL_TABLET | 0 refills | 0 days | Status: CP
Start: 2021-06-10 — End: ?

## 2021-06-16 MED ORDER — ALPRAZOLAM 0.5 MG TABLET
ORAL_TABLET | Freq: Every evening | ORAL | 0 refills | 0.00000 days | PRN
Start: 2021-06-16 — End: 2021-07-16

## 2021-06-17 MED ORDER — ALPRAZOLAM 0.5 MG TABLET
ORAL_TABLET | Freq: Every evening | ORAL | 0 refills | 60.00000 days | PRN
Start: 2021-06-17 — End: 2021-07-17

## 2021-06-20 NOTE — Unmapped (Signed)
[facility] Clinical Assessment & Refill Coordination Note    Nicholas [name], DOB: 1955/11/07  Phone: [phone] (home) [phone] (work)    All above HIPAA information was verified with patient.     Was a Nurse, learning disability used for this call? No    Specialty Medication(s):   Infectious Disease: Vemlidy     Current Outpatient Medications   Medication Sig Dispense Refill   ??? diphenoxylate-atropine (LOMOTIL) 2.5-0.025 mg per tablet Take 1 tablet by mouth four (4) times a day as needed for diarrhea.     ??? doxepin 6 mg Tab Take 1 tablet (6 mg total) by mouth nightly. 30 tablet 0   ??? glipiZIDE (GLUCOTROL XL) 5 MG 24 hr tablet Take 5 mg by mouth in the morning.     ??? lipase-protease-amylase (PANCREAZE) 37,000-97,300- 149,900 unit CpDR 3 caps with each meal and 2 with snacks     ??? lithium (ESKALITH CR) 450 MG ER tablet Take 1 tablet (450mg ) every morning and two tablets (900 mg) every night. 90 tablet 0   ??? melatonin 10 mg cap Take 10 mg by mouth nightly.     ??? nadoloL (CORGARD) 20 MG tablet Take 1 tablet (20 mg total) by mouth daily. 90 tablet 1   ??? tenofovir alafenamide (VEMLIDY) 25 mg Tab tablet Take 1 tablet (25 mg total) by mouth daily. Take with food. 30 tablet 5   ??? VRAYLAR 1.5 mg capsule Take 3 mg by mouth daily.       No current facility-administered medications for this visit.        Changes to medications: Taishawn reports no changes at this time.    Allergies   Allergen Reactions   ??? Aripiprazole Other (See Comments)     Tardive Dyskinesia   ??? Invega [Paliperidone] Other (See Comments)     Tardive dyskinsia symptoms   ??? Escitalopram Other (See Comments)     Doesn't Work   ??? Hydrocodone Itching   ??? Lamotrigine Rash   ??? Quetiapine Rash       Changes to allergies: No    SPECIALTY MEDICATION ADHERENCE     Vemlidy 25 mg: approximately 15 days of medicine on hand       Medication Adherence    Patient reported X missed doses in the last month: 0  Specialty Medication: Vemlidy 25mg   Patient is on additional specialty medications: No  Any gaps in refill history greater than 2 weeks in the last 3 months: no  Demonstrates understanding of importance of adherence: yes  Informant: patient  Provider-estimated medication adherence level: good  Patient is at risk for Non-Adherence: No          Specialty medication(s) dose(s) confirmed: Regimen is correct and unchanged.     Are there any concerns with adherence? No    Adherence counseling provided? Not needed    CLINICAL MANAGEMENT AND INTERVENTION      Clinical Benefit Assessment:    Do you feel the medicine is effective or helping your condition? Yes      HEPATITIS B AND ASSOCIATED LABS: Overdue for labs and an appointment      Lab Results   Component Value Date/Time    HBVDNAQT Not Detected 04/23/2018 11:31 AM    HBVDNAQT Not Detected 08/22/2016 10:51 AM    HBVDNAQT Not Detected 01/03/2016 12:32 PM    HBEAG Positive (A) 04/23/2018 11:31 AM    HBEAG Positive (A) 08/22/2016 10:51 AM    HBEAG Negative  01/03/2016 12:32 PM    ALT 42 10/30/2019 11:15 AM    ALT 65 (H) 05/06/2019 01:33 PM    ALT 25 02/27/2019 04:04 PM    AST 35 10/30/2019 11:15 AM    CREATININE 1.0 01/14/2020 09:14 PM    CREATININE 0.88 10/30/2019 11:15 AM    CREATININE 0.9 10/01/2019 10:00 AM    CREATININE 0.73 (L) 05/06/2019 01:33 PM    CREATININE 1.0 04/15/2019 10:26 AM    CREATININE 1.02 02/27/2019 04:04 PM    CREATININE 0.9 02/28/2018 12:38 PM    CREATININE 0.9 06/05/2014 10:09 AM       Clinical Benefit counseling provided? Not needed    Adverse Effects Assessment:    Are you experiencing any side effects? No    Are you experiencing difficulty administering your medicine? No    Quality of Life Assessment:    How many days over the past month did your Hepatitis B  keep you from your normal activities? For example, brushing your teeth or getting up in the morning. 0    Have you discussed this with your provider? Not needed    Acute Infection Status:    Acute infections noted within Epic:  No active infections  Patient reported infection: None    Therapy Appropriateness:    Is therapy appropriate and patient progressing towards therapeutic goals? Yes, therapy is appropriate and should be continued    DISEASE/MEDICATION-SPECIFIC INFORMATION      N/A    PATIENT SPECIFIC NEEDS     - Does the patient have any physical, cognitive, or cultural barriers? No    - Is the patient high risk? No    - Does the patient require a Care Management Plan? No       SHIPPING     Specialty Medication(s) to be Shipped:   Infectious Disease: Vemlidy    Other medication(s) to be shipped: No additional medications requested for fill at this time     Changes to insurance: No    Delivery Scheduled: Yes, Expected medication delivery date: 06/28/21.     Medication will be delivered via UPS to the confirmed prescription address in [facility].    The patient will receive a drug information handout for each medication shipped and additional FDA Medication Guides as required.  Verified that patient has previously received a Conservation officer, historic buildings and a Surveyor, mining.    The patient or caregiver noted above participated in the development of this care plan and knows that they can request review of or adjustments to the care plan at any time.      All of the patient's questions and concerns have been addressed.    Roderic Palau   [facility] Specialty Pharmacist

## 2021-06-27 ENCOUNTER — Telehealth
Admit: 2021-06-27 | Discharge: 2021-06-28 | Payer: MEDICARE | Attending: Student in an Organized Health Care Education/Training Program | Primary: Student in an Organized Health Care Education/Training Program

## 2021-06-27 DIAGNOSIS — F319 Bipolar disorder, unspecified: Secondary | ICD-10-CM | POA: Diagnosis not present

## 2021-06-27 MED FILL — VEMLIDY 25 MG TABLET: ORAL | 30 days supply | Qty: 30 | Fill #5

## 2021-06-27 NOTE — Unmapped (Signed)
[facility]  Psychiatry   Established Patient E&M Service - Outpatient       Assessment:    Nicholas [name] presents for follow-up evaluation. Today, Nicholas [name] reports ongoing low mood and some anhedonia. Reports this is not as severe as it has been in the past, but is bothersome to him. States that he doesn't do too much with his time. Has been walking more lately which has been helpful for pain/mobility. Does report some sleep disturbance. States he wakes up frequently in the night (epsecially to use the bathroom). Seems to be related to dry mouth and drinking more water as a result. He reports increased urinary frequency has been ongoing for ~6-12 months (pretty sure he was not having this problem before he moved to [facility] last March) and is worse at night. Given dry mouth seems to drive increased fluid intake, will plan to decrease doxepin in hopes of reducing need for fluid intake and improving sleep. Also advised trying to limit sleep prior to bedtime if possible. He was amenable to this. We did also review other possible risks of doxepin use as he ages. We are also ultimately aiming to minimize xanax use, but will defer further taper at this time in favor of doxepin decrease. Given ongoing mild depressive symptoms, we discussed behavioral activation strategies today and discussed possibility of engaging in therapy. Mekhi is interested in trying to improve social connection, build in more activities that bring mastery and pleasure. He is not as sure about therapy, but will let me know if he is interested in a referral. Will plan for f/u in ~3 weeks.     Identifying Information:  Nicholas [name] is a 66 y.o. male with a history of  Bipolar I Disorder that was diagnosed in 2003. He established with this clinic in September 2022. At that time, he was prescribed vraylar, lithium, doxepin, and xanax with fairly good efficacy and tolerability.     Risk Assessment:  A full psychiatric risk assessment was conducted on 01/31/21 and risks do not appear significantly changed from that visit.   While future psychiatric events cannot be accurately predicted, the patient does not currently require acute inpatient psychiatric care and does not currently meet [facility] involuntary commitment criteria.      Plan:    #Bipolar I Disorder:  --Continue Lithium ER 450 mg QAM, 900 mg at bedtime. Recent level of 0.8.  --Continue Vraylar 3 mg nightly   --Continue Xanax to 0.25 mg at bedtime PRN (d 11/3) Reviewed PDMP today w/ appropriate fills. Would anticipate prolonged tapering given chronicity of BZD use.  --DECREASE Doxepin to 3 mg  --Discussed behavioral activation today, Laquentin planning to try and get more plugged in with his local community (maybe a church).  --Recommend discussing PT with PCP to help w/ his unsteadiness on feet/hoping this could increase activity level.  --Consider referral for therapy, not sure at this time.  ??  #Medication monitoring:   --Recent Cr of 0.8 per careeverywhere records  Lab Results   Component Value Date    Cholesterol 136 06/06/2021    LDL Calculated 67 06/06/2021    HDL 33 (L) 06/06/2021    Triglycerides 181 (H) 06/06/2021     Hemoglobin A1C:   Lab Results   Component Value Date    Hemoglobin A1C 6.8 01/12/2021      Fasting Blood Sugar:   Lab Results   Component Value Date    Glucose 93 10/30/2019  Lithium Lvl   Date Value Ref Range Status   06/06/2021 0.8 0.5 - 1.2 mmol/L Final     TSH   Date Value Ref Range Status   06/06/2021 3.535 0.550 - 4.780 uIU/mL Final       Psychotherapy provided:  No billable psychotherapy service provided but brief supportive therapy was utilized.    Patient has been given this writer's contact information as well as the [facility] urgent line number. The patient has been instructed to call 911 for emergencies.    Subjective:    Interval History: He reports that he is pretty good. He reports he has felt a little depressed. He notes he feels a little drugged during the day. States that he feels a littel dizzy during the day. BP was low last time he went in.     He states he has had dizziness for a few weeks. He states that he drinks a lot of fluid d/t dry mouth.     He reports that he has been walking more. He states that has helped w/ mobility/pain some. They haven't gotten plugged in with a church yet.    He reports an ex-partner passed away recently. He is grieving his ex-partner some. This person passed away 5 days ago. They had stayed in touch after the breakup.     Mood is down sometimes. States he doesn't sleep very well. He goes to bed around 10-11 PM, watches TV and then [name] gets up at 4:45 AM. He wakes up every 1.5 hrs. Goes back to sleep from 6-9 AM. He reports he goes to the bathroom frequently in the night (drinks a lot of water). He doesn't cut off fluids in the evening. He did try to stop drinking fluids at 8 PM last night, which did make some difference. Falls asleep quickly w/ his Xanax. He has done OK with 1/2 dose of Xanax. Has urinary frequency during the day as well as overnight. States that this has been ongoing for 6 months to 1 year (didn't have this problem in [employer]).    He states he is enjoying things less lately. He notes that he didn't enjoy things at Christmas as much (like putting the tree up). He would usually really enjoy that, but it didn't enjoy it much this year. He states that motivation can be low. Does get things done eventually. Denies any SI.     Discussed trying to incorporate some things that could increase mastery  and pleasure.     He is not sure about being in therapy. Will talk to [name] and let me know.    Discussed POC (see assessment above for details). No other Q's or concerns today. Will f/u in 3 weeks. Plan to send eric video link at that appt so he can join the visit.     Objective:    Mental Status Exam:  Appearance:    Appears stated age, Well nourished, Well developed and Clean/Neat   Motor:   No abnormal movements   Speech/Language:    Normal rate, volume, tone, fluency and Language intact, well formed   Mood:   a little down   Affect:   Constricted and brighter and laughing at times   Thought process and Associations:   Logical, linear, clear, coherent, goal directed   Abnormal/psychotic thought content:     Denies any SI. Future-oriented. States mood is down at times. Motivation lower and reporting some anhedonia. Not voicing any bizarre or delusional thought  content on interview.   Perceptual disturbances:     He is not endorsing any AVH. No RIS on interview.     Other:            Visit was completed by video (or phone) and the appropriate disclaimer has been included below.    I personally spent 60 minutes face-to-face and non-face-to-face in the care of this patient, which includes all pre, intra, and post visit time on the date of service.          The patient reports they are currently: at home. I spent 40 minutes on the real-time audio and video with the patient on the date of service. I spent an additional 20 minutes on pre- and post-visit activities on the date of service.     The patient was physically located in [place] or a state in which I am permitted to provide care. The patient and/or parent/guardian understood that s/he may incur co-pays and cost sharing, and agreed to the telemedicine visit. The visit was reasonable and appropriate under the circumstances given the patient's presentation at the time.    The patient and/or parent/guardian has been advised of the potential risks and limitations of this mode of treatment (including, but not limited to, the absence of in-person examination) and has agreed to be treated using telemedicine. The patient's/patient's family's questions regarding telemedicine have been answered.     If the visit was completed in an ambulatory setting, the patient and/or parent/guardian has also been advised to contact their provider???s office for worsening conditions, and seek emergency medical treatment and/or call 911 if the patient deems either necessary.    Dennison Bulla, MD

## 2021-06-27 NOTE — Unmapped (Signed)
Follow-up instructions:  --DECREASE doxepin to 3 mg nightly.  --Let me know if interested in referral for therapy  --Work on increasing social connection and other meaningful or enjoyable activities. Try to set small, achievable goals like calling the senior center in your area or attending one church event this week. Build up from there.   -- Please continue taking your medications as prescribed for your mental health.   -- Do not make changes to your medications, including taking more or less than prescribed, unless under the supervision of your physician. Be aware that some medications may make you feel worse if abruptly stopped  -- Please refrain from using illicit substances, as these can affect your mood and could cause anxiety or other concerning symptoms.   -- Seek further medical care for any increase in symptoms or new symptoms such as thoughts of wanting to hurt yourself or hurt others.     Contact info:  Life-threatening emergencies: call 911 or go to the nearest ER for medical or psychiatric attention.     Issues that need urgent attention but are not life threatening: call the clinic outpatient frontdesk at [phone] for assistance.     Non-urgent routine concerns, questions, and refill requests: please send me a mychart message and I will get back to you within 2 business days. If you prefer to call, please call the front desk at [phone].    Regarding appointments:  - If you need to cancel your appointment, we ask that you call [phone] at least 24 hours before your scheduled appointment.  - If for any reason you arrive 15 minutes later than your scheduled appointment time, you may not be seen and your visit may be rescheduled.  - Please remember that we will not automatically reschedule missed appointments.  - If you miss two (2) appointments without letting us know in advance, you will likely be referred to a provider in your community.  - We will do our best to be on time. Sometimes an emergency will arise that might cause your clinician to be late. We will try to inform you of this when you check in for your appointment. If you wait more than 15 minutes past your appointment time without such notice, please speak with the front desk staff.    In the event of bad weather, the clinic staff will attempt to contact you, should your appointment need to be rescheduled. Additionally, you can call the Patient Weather Line [phone] for system-wide clinic status    For more information and reminders regarding clinic policies (these were provided when you were admitted to the clinic), please ask the front desk.

## 2021-07-05 MED ORDER — DOXEPIN 6 MG TABLET
ORAL_TABLET | 0 refills | 0 days
Start: 2021-07-05 — End: ?

## 2021-07-06 MED ORDER — DOXEPIN 3 MG TABLET
ORAL_TABLET | Freq: Every evening | ORAL | 0 refills | 0 days | Status: CP
Start: 2021-07-06 — End: ?

## 2021-07-08 ENCOUNTER — Telehealth: Payer: Self-pay | Admitting: Medical

## 2021-07-08 MED ORDER — DOXEPIN 3 MG TABLET
ORAL_TABLET | 0 refills | 0 days
Start: 2021-07-08 — End: ?

## 2021-07-08 NOTE — Telephone Encounter (Signed)
Left message for patient to call back and schedule Medicare Annual Wellness Visit (AWV) in office.  ° °If not able to come in office, please offer to do virtually or by telephone.  Left office number and my jabber #[phone]. ° °Due for AWVI ° °Please schedule at anytime with Nurse Health Advisor. °  °

## 2021-07-08 NOTE — Unmapped (Signed)
Refill sent on 3/1

## 2021-07-14 ENCOUNTER — Telehealth: Admit: 2021-07-14 | Discharge: 2021-07-15 | Payer: MEDICARE

## 2021-07-14 DIAGNOSIS — K769 Liver disease, unspecified: Principal | ICD-10-CM

## 2021-07-14 DIAGNOSIS — K7460 Unspecified cirrhosis of liver: Principal | ICD-10-CM

## 2021-07-14 DIAGNOSIS — B181 Chronic viral hepatitis B without delta-agent: Principal | ICD-10-CM

## 2021-07-14 NOTE — Unmapped (Signed)
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  Today's visit is being done via video due to COVID-19.  He was last seen in the hepatology clinic on 03/10/2020.  He denies any complications including variceal bleed, ascites or hepatic encephalopathy. He has not been evaluated for transplant secondary low meld score (6). Since his last clinic visit he denies any complications or hospitalizations.  He remains on Vemlidy 25 mg daily.  l.  His most recent MRI was on 01/10/2021 and showed LR-3 nodule in the right hepatic dome which appears similar to prior.  Additional LR-2 perfusional changes are similar to prior.  He  underwent a bone density on 10/31/2018 which was normal.  He was diagnosed with chronic hepatitis B approximately 15 years ago.  Patient has never undergone a liver biopsy.  He was treated with Epivir and Hepsera for approximately 3-4 years but this was discontinued approximately 5 years ago when he was hospitalized for bipolar disorder.  He was never retreated.  He was initially seen in Hepatology clinic on 06/02/2013. He does not use any alcohol products at this time and only uses occasional alcohol in his 20s or 30s. .  As noted before he was diagnosed proximal in 5 years ago with bipolar disorder and is currently well controlled.       Today  he overall feels well and denies any fever, chills, headache, jaundice, chest pain, upper lower GI bleeding, melena or confusion.    CIRRHOSIS CARE:  1. EGD: 01/2020. OSH On nadolol.  2. Imaging: MRI 02/2021. Repeat ordered  3. Vaccination for hepatitis A and B: Immune to A.  4. SBP prophylaxis [h/o SBP, or CP ? 9 w/ bili ? 3, or cr ? 1.2, BUN ? 25, or Na ? 130]:   5. Bone health: 10/31/2018  6. Nutrition: wt & exercise mgmnt, low Na intake and bedtime snack discussed.  7. OTC agents: proper use of acetaminophen and avoidance of NSAIA's & HDS products discussed.  8. Transplant: Not in evaluation secondary low meld score/clinical stability.        PMH:  1.  Chronic hepatitis B with cirrhosis  2.  Bipolar disorder.  3.  Depression    PSH:  1.  Appendectomy    MEDICATIONS:     ALLERGIES:  1.  Lamictal-rash  2.  Lexapro  3.  Abilify  4. Oxycodone-itching      FH: No known liver disease or cancer    SH: He is single and arrives today accompanied by his partner.  He quit tobacco several years ago and does not use any alcohol products.    Imaging  MRI ABDOMEN WITHOUT AND WITH CONTRAST  ??  TECHNIQUE:  Multiplanar multisequence MR imaging of the abdomen was performed  both before and after the administration of intravenous contrast.  ??  CONTRAST:  10 cc Gadavist  ??  COMPARISON:  None.  ??  FINDINGS:  Lower chest: Atelectasis or scarring noted in the lung bases.  ??  Hepatobiliary: Mild loss of signal intensity in the liver parenchyma  on out of phase T1 imaging is compatible underlying mild component  of diffuse fatty deposition. No focal restricted diffusion within  the liver. 10 mm hypervascular lesion identified in the dome of  liver on arterial phase postcontrast image 15 of series 11. This  lesion shows no washout or peripheral rim enhancement. No other  suspicious hypervascular abnormality within the liver parenchyma.  Nodular liver contour compatible with the reported clinical history  of cirrhosis. Gallbladder is nondistended. No intrahepatic or  extrahepatic biliary dilation.  ??  Pancreas: No focal mass lesion. No dilatation of the main duct. No  intraparenchymal cyst. No peripancreatic edema.  ??  Spleen:  Spleen is enlarged at 14.7 cm craniocaudal length.  ??  Adrenals/Urinary Tract: No adrenal nodule or mass. Kidneys  unremarkable.  ??  Stomach/Bowel: Paraesophageal varices evident. Stomach is  unremarkable. No gastric wall thickening. No evidence of outlet  obstruction. Duodenum is normally positioned as is the ligament of  Treitz. No small bowel or colonic dilatation within the visualized  abdomen.  ??  Vascular/Lymphatic: No abdominal aortic aneurysm. There is no  gastrohepatic or hepatoduodenal ligament lymphadenopathy. No  retroperitoneal or mesenteric lymphadenopathy.  ??  Other:  No intraperitoneal free fluid.  ??  Musculoskeletal: No focal suspicious marrow enhancement within the  visualized bony anatomy.  ??  IMPRESSION:  1. 10 mm hypervascular lesion in the dome of liver. Imaging features  compatible with LI-RADS 3 lesion. Follow-up MRI in 3-6 months  recommended to ensure stability.  2. Hepatic cirrhosis. 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  ??      LABS:   Lab Results   Component Value Date    WBC 7.4 10/30/2019    HGB 14.4 10/30/2019    HCT 41.4 10/30/2019    PLT 118 (L) 10/30/2019       Lab Results   Component Value Date    NA 142 10/30/2019    K 4.5 10/30/2019    CL 106 10/30/2019    CO2 25 10/30/2019    BUN 11 10/30/2019    CREATININE 1.0 01/14/2020    GLU 73 08/22/2016    CALCIUM 9.5 10/30/2019    PHOS 4.0 04/23/2018       Lab Results   Component Value Date    BILITOT 0.3 10/30/2019    BILIDIR 0.11 10/30/2019    PROT 7.2 10/30/2019    ALBUMIN 4.2 04/23/2018    ALT 42 10/30/2019    AST 35 10/30/2019    ALKPHOS 79 10/30/2019    GGT 42 12/04/2018       Lab Results   Component Value Date    LABPROT 11.2 10/30/2019    INR 1.0 10/30/2019     Computed MELD-Na score unavailable. Necessary lab results were not found in the last year.  Computed MELD score unavailable. Necessary lab results were not found in the last year.    CP score:6  Childs Class: A    ASSESSMENT:Mr. 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  Today's visit is being done via video..  .  He denies any complications including variceal bleed, ascites or hepatic encephalopathy. He has not been evaluated for transplant secondary  low meld score (6). Since his last clinic visit he denies any complications or hospitalizations.  He remains on Vemlidy 25 mg daily.    Cirrhosis care is up to date.  Today  he overall feels well and denies any fever, chills, headache, jaundice, chest pain, upper lower GI bleeding, melena or confusion.          PLAN:  1.  I have discussed his care with Dr. Pervis Hocking  2.  Check laboratory studies at [facility], same day as MRI  3.  Continue Vemlidy. Decrease Nadolol to 20mg  daily, due to low HR.   4.  MRI at [facility]  5.  I did discuss with patient that is is not in need of transplant evaluation at this time   6.  Plan to see Mr. Kras back in the liver clinic in 6 months.        The patient reports they are currently: at home. I spent 20 minutes on the real-time audio and video visit with the patient on the date of service. I spent an additional 10 minutes on pre- and post-visit activities on the date of service.     The patient was not located and I was located within 250 yards of a hospital based location during the real-time audio and video visit. The patient was physically located in [place] or a state in which I am permitted to provide care. The patient and/or parent/guardian understood that s/he may incur co-pays and cost sharing, and agreed to the telemedicine visit. The visit was reasonable and appropriate under the circumstances given the patient's presentation at the time.    The patient and/or parent/guardian has been advised of the potential risks and limitations of this mode of treatment (including, but not limited to, the absence of in-person examination) and has agreed to be treated using telemedicine. The patient's/patient's family's questions regarding telemedicine have been answered.    If the visit was completed in an ambulatory setting, the patient and/or parent/guardian has also been advised to contact their provider???s office for worsening conditions, and seek emergency medical treatment and/or call 911 if the patient deems either necessary.          Vallery Sa, NP-C  [facility]  [address] [place]  [place], [place]  [address]  Ph: [phone]  Fax: [phone]

## 2021-07-16 ENCOUNTER — Other Ambulatory Visit: Payer: Self-pay | Admitting: Nurse Practitioner

## 2021-07-18 ENCOUNTER — Telehealth
Admit: 2021-07-18 | Discharge: 2021-07-19 | Payer: MEDICARE | Attending: Student in an Organized Health Care Education/Training Program | Primary: Student in an Organized Health Care Education/Training Program

## 2021-07-18 DIAGNOSIS — F319 Bipolar disorder, unspecified: Secondary | ICD-10-CM | POA: Diagnosis not present

## 2021-07-18 MED ORDER — VRAYLAR 1.5 MG CAPSULE
ORAL_CAPSULE | Freq: Every day | ORAL | 0 refills | 30 days | Status: CP
Start: 2021-07-18 — End: 2021-08-17

## 2021-07-18 NOTE — Unmapped (Signed)
[facility]  Psychiatry   Established Patient E&M Service - Outpatient       Assessment:    Nicholas [name] presents for follow-up evaluation. Today, [name] reports he is doing OK. Still struggling to stay asleep at night (especially d/t frequent awakening to use the bathroom). [name] is managing medication so he is not sure if he is on lower dose of doxepin or not. He is not feeling depressed and denies symptoms of mania. Meds are well-tolerated. He does feel like mood is not as good as it could be. Does sound like he is doing more enjoyable activities lately (including grocery shopping, watching TV). However, he is still spending a lot of time in bed and does not have many opportunities for social connection in [place]. We discussed discontinuing Xanax given risks of being on this medication (particularly given his age). Could also consider further taper of doxepin (will need to confirm current dose w/ [name] before making further changes). We continue to discuss behavioral activation and finding ways to get more social connection as well as activities that can improve mastery/pleasure. Have previously discussed referral for therapy, but pt is not interested at this time. He is working on getting a PCP in the local area that could refer him for PT closer to home. I think that will be helpful as well given it may facilitate more activities that are enjoyable and because Branson notes that feeling less able/mobile can sometimes bring his mood down. Will plan for f/u in 1 month.    Identifying Information:  Nicholas [name] is a 66 y.o. male with a history of  Bipolar I Disorder that was diagnosed in 2003. He established with this clinic in September 2022. At that time, he was prescribed vraylar, lithium, doxepin, and xanax with fairly good efficacy and tolerability.     Risk Assessment:  A full psychiatric risk assessment was conducted on 01/31/21 and risks do not appear significantly changed from that visit.   While future psychiatric events cannot be accurately predicted, the patient does not currently require acute inpatient psychiatric care and does not currently meet [facility] involuntary commitment criteria.      Plan:    #Bipolar I Disorder:  --Continue Lithium ER 450 mg QAM, 900 mg at bedtime. Recent level of 0.8.  --Continue Vraylar 3 mg nightly   --Discontinue Xanax   --Continue Doxepin 3 mg (need to confirm current dose w/ [name], may benefit from further taper).  --Discussed behavioral activation today, Oriel planning to try and get more plugged in with his local community (maybe a church).  --Recommend discussing PT with PCP to help w/ his unsteadiness on feet/hoping this could increase activity level.  --Consider referral for therapy, pt has not been interested in this  ??  #Medication monitoring:   --Recent Cr of 0.8 per careeverywhere records  Lab Results   Component Value Date    Cholesterol 136 06/06/2021    LDL Calculated 67 06/06/2021    HDL 33 (L) 06/06/2021    Triglycerides 181 (H) 06/06/2021     Hemoglobin A1C:   Lab Results   Component Value Date    Hemoglobin A1C 6.8 01/12/2021      Fasting Blood Sugar:   Lab Results   Component Value Date    Glucose 93 10/30/2019     Lithium Lvl   Date Value Ref Range Status   06/06/2021 0.8 0.5 - 1.2 mmol/L Final     TSH   Date  Value Ref Range Status   06/06/2021 3.535 0.550 - 4.780 uIU/mL Final       Psychotherapy provided:  No billable psychotherapy service provided but brief supportive therapy was utilized.    Patient has been given this writer's contact information as well as the [facility] Psychiatry urgent line number. The patient has been instructed to call 911 for emergencies.    Subjective:    Interval History:  Pt states that he is doing better. Reports that [name] is taking 2 weeks off in a few weeks. They are going to work on getting some activities for Tajh during that time. They recently visited [place]. He reports that he has been working on his taxes. He has been watching TV again. He states this is a good thing. He has been getting out, going to the grocery store (which he hadn't been doing before). He notes he is spending a lot of time in bed, but he doesn't sleep that well. He reports that he gets to sleep pretty easily, but has trouble with waking up throughout the night. Has been trying to limit liquids at night, but states this is hard d/t dry mouth. He is not sure what dose of doxepin he is currently on [name] gives him the medication). Still having diarrhea and is talking w/ his liver doctor about that. It may be a little better.     Medications seem to be OK. He denies any SE. He reports that Xanax does help him sleep (falls asleep within 15-20 minutes).     Discussed stopping Xanax.     He reports that his mood is fairly stable . States he is not depressed, but mood is not good. He is walking a little more, getting out to get the mail. He feels like his mobility is a bit better. He is trying to walk dogs.     He is working on finding a Radio broadcast assistant. He does want to get physical therapy, but needs to get it locally.     Discussed POC (see assessment above for details). No other Q's or concerns today.       Objective:    Mental Status Exam:  Appearance:    Appears stated age, Well nourished, Well developed and Clean/Neat   Motor:   No abnormal movements   Speech/Language:    Normal rate, volume, tone, fluency and Language intact, well formed   Mood:   Meh   Affect:   Constricted and brighter and laughing at times   Thought process and Associations:   Logical, linear, clear, coherent, goal directed   Abnormal/psychotic thought content:    Mood isn't depressed, but not good. Does enjoy some activities. Not endorsing any SI. Remains future oriented. Not voicing any bizarre or delusional thought content on interview.   Perceptual disturbances:     He is not endorsing any AVH. No RIS on interview.     Other:            Visit was completed by video (or phone) and the appropriate disclaimer has been included below.    I personally spent 40 minutes face-to-face and non-face-to-face in the care of this patient, which includes all pre, intra, and post visit time on the date of service.        The patient reports they are currently: at home. I spent 25 minutes on the real-time audio and video with the patient on the date of service. I spent an additional 15 minutes on pre-  and post-visit activities on the date of service.     The patient was physically located in [place] or a state in which I am permitted to provide care. The patient and/or parent/guardian understood that s/he may incur co-pays and cost sharing, and agreed to the telemedicine visit. The visit was reasonable and appropriate under the circumstances given the patient's presentation at the time.    The patient and/or parent/guardian has been advised of the potential risks and limitations of this mode of treatment (including, but not limited to, the absence of in-person examination) and has agreed to be treated using telemedicine. The patient's/patient's family's questions regarding telemedicine have been answered.     If the visit was completed in an ambulatory setting, the patient and/or parent/guardian has also been advised to contact their provider???s office for worsening conditions, and seek emergency medical treatment and/or call 911 if the patient deems either necessary.      Dennison Bulla, MD

## 2021-07-18 NOTE — Unmapped (Signed)
Follow-up instructions:  --STOP Xanax  --Please send me a message to confirm your doxepin dose after you talk with Minerva Nicholas [name]  -- Please continue taking your medications as prescribed for your mental health.   -- Do not make changes to your medications, including taking more or less than prescribed, unless under the supervision of your physician. Be aware that some medications may make you feel worse if abruptly stopped  -- Please refrain from using illicit substances, as these can affect your mood and could cause anxiety or other concerning symptoms.   -- Seek further medical care for any increase in symptoms or new symptoms such as thoughts of wanting to hurt yourself or hurt others.     Contact info:  Life-threatening emergencies: call 911 or go to the nearest ER for medical or psychiatric attention.     Issues that need urgent attention but are not life threatening: call the clinic outpatient frontdesk at [phone] for assistance.     Non-urgent routine concerns, questions, and refill requests: please send me a mychart message and I will get back to you within 2 business days. If you prefer to call, please call the front desk at [phone].    Regarding appointments:  - If you need to cancel your appointment, we ask that you call [phone] at least 24 hours before your scheduled appointment.  - If for any reason you arrive 15 minutes later than your scheduled appointment time, you may not be seen and your visit may be rescheduled.  - Please remember that we will not automatically reschedule missed appointments.  - If you miss two (2) appointments without letting us know in advance, you will likely be referred to a provider in your community.  - We will do our best to be on time. Sometimes an emergency will arise that might cause your clinician to be late. We will try to inform you of this when you check in for your appointment. If you wait more than 15 minutes past your appointment time without such notice, please speak with the front desk staff.    In the event of bad weather, the clinic staff will attempt to contact you, should your appointment need to be rescheduled. Additionally, you can call the Patient Weather Line [phone] for system-wide clinic status    For more information and reminders regarding clinic policies (these were provided when you were admitted to the clinic), please ask the front desk.

## 2021-07-22 DIAGNOSIS — B181 Chronic viral hepatitis B without delta-agent: Principal | ICD-10-CM

## 2021-07-22 MED ORDER — VEMLIDY 25 MG TABLET
ORAL_TABLET | Freq: Every day | ORAL | 5 refills | 30 days
Start: 2021-07-22 — End: ?

## 2021-07-22 MED ORDER — LITHIUM CARBONATE ER 450 MG TABLET,EXTENDED RELEASE
ORAL_TABLET | 0 refills | 0 days | Status: CP
Start: 2021-07-22 — End: ?

## 2021-07-24 MED ORDER — VEMLIDY 25 MG TABLET
ORAL_TABLET | Freq: Every day | ORAL | 5 refills | 30 days | Status: CP
Start: 2021-07-24 — End: ?
  Filled 2021-07-29: qty 30, 30d supply, fill #0

## 2021-07-24 NOTE — Unmapped (Signed)
Refill request for Vemlidy    LCV 07/14/21 with Roddie Mc, NP (telemed)    Will authorize refill at this time    Lanora Manis ,RN

## 2021-07-25 NOTE — Unmapped (Signed)
[facility] Refill Coordination Note    Specialty Medication(s) to be Shipped:   Infectious Disease: Vemlidy    Other medication(s) to be shipped: No additional medications requested for fill at this time     Nicholas [name], DOB: 1955/07/06  Phone: [phone] (home) [phone] (work)      All above HIPAA information was verified with patient.     Was a Nurse, learning disability used for this call? No    Completed refill call assessment today to schedule patient's medication shipment from the [facility] [phone]).  All relevant notes have been reviewed.     Specialty medication(s) and dose(s) confirmed: Regimen is correct and unchanged.   Changes to medications: Nicholas [name] reports no changes at this time.  Changes to insurance: No  New side effects reported not previously addressed with a pharmacist or physician: None reported  Questions for the pharmacist: No    Confirmed patient received a Conservation officer, historic buildings and a Surveyor, mining with first shipment. The patient will receive a drug information handout for each medication shipped and additional FDA Medication Guides as required.       DISEASE/MEDICATION-SPECIFIC INFORMATION        N/A    SPECIALTY MEDICATION ADHERENCE     Medication Adherence    Patient reported X missed doses in the last month: 0  Specialty Medication: Vemlidy 25mg   Patient is on additional specialty medications: No  Informant: patient              Were doses missed due to medication being on hold? No    Vemlidy 25 mg: 10 days of medicine on hand     REFERRAL TO PHARMACIST     Referral to the pharmacist: Not needed      [facility]     Shipping address confirmed in Epic.     Delivery Scheduled: Yes, Expected medication delivery date: 08/01/21.     Medication will be delivered via UPS to the prescription address in Epic WAM.    Nicholas [name]   [facility] Specialty Technician

## 2021-08-05 ENCOUNTER — Ambulatory Visit: Admit: 2021-08-05 | Discharge: 2021-08-06 | Payer: MEDICARE

## 2021-08-08 MED ORDER — DOXEPIN 3 MG TABLET
ORAL_TABLET | 0 refills | 0 days | Status: CP
Start: 2021-08-08 — End: ?

## 2021-08-15 ENCOUNTER — Telehealth
Admit: 2021-08-15 | Discharge: 2021-08-16 | Payer: MEDICARE | Attending: Student in an Organized Health Care Education/Training Program | Primary: Student in an Organized Health Care Education/Training Program

## 2021-08-15 DIAGNOSIS — Z79899 Other long term (current) drug therapy: Principal | ICD-10-CM

## 2021-08-15 MED ORDER — HYDROXYZINE HCL 25 MG TABLET
ORAL_TABLET | Freq: Two times a day (BID) | ORAL | 0 refills | 15 days | Status: CP | PRN
Start: 2021-08-15 — End: ?

## 2021-08-15 NOTE — Unmapped (Signed)
[name]  Psychiatry   Established Patient E&M Service - Outpatient       Assessment:    Nicholas Nicholas [name] presents for follow-up evaluation. Today, Nicholas Nicholas [name] reports he is continued to be somewhat down recently. Has not felt very motivated or been as interested in certain activities. Did have some improvement in mood in the last week in the context of spending more time with his husband, getting out of the house more. However, in general he has felt somewhat down for about a year. He is still feeling isolated, sleeping a lot, and does not engage as much in enjoyable activities. We have discussed considerations including therapy, behavioral activation on multiple occasions, but there has been some reluctance about these ideas and some difficulty with increasing activity in the context of anhedonia, Nicholas [name] energy and Nicholas [name] motivation of depression. Given this, we discussed possibly restarting a medication for depression.Patient reports good efficacy of effexor for depression when used in the past. Did discuss need for careful monitoring for any mood activation given bipolar diagnosis. Patient and husband report they do not recall any hx of mania while on Effexor, but do not recall why it was discontinued. I did review past records from [facility] (previous psych provider), but there is not documentation of reason for stopping effexor or any adverse effects on this medication. I have called them today to gain some clarity on reasons for stopping effexor and any contraindications to restarting the medication. Nicholas Nicholas [name] does have liver disease, but appears from literature review that effexor should be safe though target dose would be lower given liver disease. I did also reach out to GI provider Nicholas Nicholas [name]) who does not have any concerns for starting effexor. At this time, I will await callback from Nicholas Nicholas [name]. If I am unable to obtain collateral from them in the next few days, I do think it will be reasonable to start Nicholas [name] dose effexor with close monitoring for any activation, insomnia, other signs of mania/hypomania. In addition, we discussed starting PRN hydroxyzine for anxiety. Patient was amenable after discussing risks/benefits. He has had some episodes of significant anxiety for which he has taken PRN xanax since our last visit. May also be reasonable to have small number of xanax available for severe anxiety if hydroxyzine is not effective. We are planning for f/u in 1 month.    Identifying Information:  Nicholas Nicholas [name] is a 66 y.o. male with a history of  Bipolar I Disorder that was diagnosed in 2003. He established with this clinic in September 2022. At that time, he was prescribed vraylar, lithium, doxepin, and xanax with fairly good efficacy and tolerability.     Risk Assessment:  A full psychiatric risk assessment was conducted on 01/31/21 and risks do not appear significantly changed from that visit.   While future psychiatric events cannot be accurately predicted, the patient does not currently require acute inpatient psychiatric care and does not currently meet [facility] involuntary commitment criteria.      Plan:    #Bipolar I Disorder:  --Continue Lithium ER 450 mg QAM, 900 mg at bedtime. Recent level of 0.8.  --Continue Vraylar 3 mg nightly   --Continue Doxepin 3 mg likely plan for continued taper  --START hydroxyzine 25 mg BID PRN. Discussed he can start at 12.5 mg and go up if needed.  --consider restarting effexor - would like to get collateral from previous provider Nicholas Nicholas [name]) before if possible.   --Discussed behavioral activation today, Nicholas Nicholas [name] planning  to try and get more plugged in with his local community (maybe a church).  --Recommend discussing PT with PCP to help w/ his unsteadiness on feet/hoping this could increase activity level.  --Consider referral for therapy, pt has not been interested in this  ??  #Medication monitoring:   --Ordered BMP today  --Recent Cr of 0.8 per careeverywhere records  Lab Results   Component Value Date    Cholesterol 136 06/06/2021    LDL Calculated 67 06/06/2021    HDL 33 (L) 06/06/2021    Triglycerides 181 (H) 06/06/2021     Hemoglobin A1C:   Lab Results   Component Value Date    Hemoglobin A1C 6.8 01/12/2021      Fasting Blood Sugar:   Lab Results   Component Value Date    Glucose 93 10/30/2019     Lithium Lvl   Date Value Ref Range Status   06/06/2021 0.8 0.5 - 1.2 mmol/L Final     TSH   Date Value Ref Range Status   06/06/2021 3.535 0.550 - 4.780 uIU/mL Final       Psychotherapy provided:  No billable psychotherapy service provided but brief supportive therapy was utilized.    Patient has been given this writer's contact information as well as the [facility] Psychiatry urgent line number. The patient has been instructed to call 911 for emergencies.    Subjective:    Interval History:  Pt states that twice since our last visit he had to take a xanax. States that he got really claustrophic in an MRI recently and had to hit the panic button. Patient's partner is here. States that Nicholas Nicholas [name] had some episodes overnight in which he felt very anxious and couldn't go back to sleep Denies nightmares. Reports that there were no specific triggers for those episodes. He was willing to try hydroxyzine. We discussed risks/benefits of that medication.    Reports that recently he has felt blah. States sometimes I get a sinking feeling, just not feeling right or depressed. States that it feels like I've fallen into a black hole. States that this occurs a few times per week. He notes this happens 30 minutes or 1 hr. Per patient's husband, it tends to get better if he gets up and does something it gets better. He is having Nicholas [name] energy, Nicholas [name] motivation. Partner notes his self-esteem is Nicholas [name]. Reports that Nicholas Nicholas [name] has been on vacation for the past 9 days. They have stayed busy, gotten out a lot. They are also keeping a consistent bedtime. Nicholas Nicholas [name] will go back to work today. He reports that he doesn't feel hopeless, but doesn't feel hopeful either. Denies any passive SI. He is not having change in appetite. He notes that he doesn't eat that much. Nicholas [name] appetite is normal. He does not try to focus very much. Nicholas Nicholas [name] states that he doesn't seem as interested in technology as he would usually be. He does still enjoy some things. Sleep is similar to our last visit. Nicholas Nicholas [name] reports that he does nap during the day. He states that Nicholas Nicholas [name] goes back to bed after he goes to work for the day. He reports that he sleeps off and on throughout the day. He is wondering if that is from his pancreatic enzymes.    Reports that he has had increased thirst for 2.5 years. He notes that he thinks this started with starting Pancreaze. He has been on lithium for 10+ years.     Did take effexor in the past. Took for  up to 10 years to help w/ mood. Helped with mood. Does not recall reason for stopping. Denies any history of mania on that medication.    Discussed POC (see assessment above for details). I did initially tell patient and his husband that I would send in Effexor. Would like to defer as detailed above. I attempted to call patient and now have sent mychart message to let them know I am awaiting collateral info.      Objective:    Mental Status Exam:  Appearance:    Appears stated age, Well nourished, Well developed and Clean/Neat   Motor:   No abnormal movements   Speech/Language:    Normal rate, volume, tone, fluency and Language intact, well formed   Mood:   Blah   Affect:   Constricted and brighter and laughing at times   Thought process and Associations:   Logical, linear, clear, coherent, goal directed   Abnormal/psychotic thought content:    Mood isn't depressed, but not good. Does enjoy some activities. Less interested in some things he would usually like. Not endorsing any SI. Remains future oriented. Not voicing any bizarre or delusional thought content on interview.   Perceptual disturbances:     He is not endorsing any AVH. No RIS on interview.     Other:            Visit was completed by video (or phone) and the appropriate disclaimer has been included below.    I personally spent 60 minutes face-to-face and non-face-to-face in the care of this patient, which includes all pre, intra, and post visit time on the date of service.        The patient reports they are currently: at home. I spent 30 minutes on the real-time audio and video with the patient on the date of service. I spent an additional 30 minutes on pre- and post-visit activities on the date of service. Spent time today reviewing information on effexor in liver dysfunction, in coordination of care and reviewing past psychiatric records.    The patient was physically located in [place] or a state in which I am permitted to provide care. The patient and/or parent/guardian understood that s/he may incur co-pays and cost sharing, and agreed to the telemedicine visit. The visit was reasonable and appropriate under the circumstances given the patient's presentation at the time.    The patient and/or parent/guardian has been advised of the potential risks and limitations of this mode of treatment (including, but not limited to, the absence of in-person examination) and has agreed to be treated using telemedicine. The patient's/patient's family's questions regarding telemedicine have been answered.     If the visit was completed in an ambulatory setting, the patient and/or parent/guardian has also been advised to contact their provider???s office for worsening conditions, and seek emergency medical treatment and/or call 911 if the patient deems either necessary.        Dennison Bulla, MD

## 2021-08-15 NOTE — Unmapped (Addendum)
Follow-up instructions:  --START hydroxyzine up to 2 times per day as needed for anxiety. Would recommend trying 1/2 tablet (12.5 mg) to start. If that is not effective for anxiety and you are not having side effects including sedation, dizziness/lightheadedness it is OK to take 1 tablet (25 mg) per dose. Can go up to 2 tablets (50 mg) per dose if needed, but would not recommend taking more than 2 tablets at a time.   -- Please continue taking your medications as prescribed for your mental health.   -- Do not make changes to your medications, including taking more or less than prescribed, unless under the supervision of your physician. Be aware that some medications may make you feel worse if abruptly stopped  -- Please refrain from using illicit substances, as these can affect your mood and could cause anxiety or other concerning symptoms.   -- Seek further medical care for any increase in symptoms or new symptoms such as thoughts of wanting to hurt yourself or hurt others.     Contact info:  Life-threatening emergencies: call 911 or go to the nearest ER for medical or psychiatric attention.     Issues that need urgent attention but are not life threatening: call the clinic outpatient frontdesk at [phone] for assistance.     Non-urgent routine concerns, questions, and refill requests: please send me a mychart message and I will get back to you within 2 business days. If you prefer to call, please call the front desk at [phone].    Regarding appointments:  - If you need to cancel your appointment, we ask that you call [phone] at least 24 hours before your scheduled appointment.  - If for any reason you arrive 15 minutes later than your scheduled appointment time, you may not be seen and your visit may be rescheduled.  - Please remember that we will not automatically reschedule missed appointments.  - If you miss two (2) appointments without letting us know in advance, you will likely be referred to a provider in your community.  - We will do our best to be on time. Sometimes an emergency will arise that might cause your clinician to be late. We will try to inform you of this when you check in for your appointment. If you wait more than 15 minutes past your appointment time without such notice, please speak with the front desk staff.    In the event of bad weather, the clinic staff will attempt to contact you, should your appointment need to be rescheduled. Additionally, you can call the Patient Weather Line [phone] for system-wide clinic status    For more information and reminders regarding clinic policies (these were provided when you were admitted to the clinic), please ask the front desk.

## 2021-08-16 MED ORDER — VENLAFAXINE ER 37.5 MG CAPSULE,EXTENDED RELEASE 24 HR
ORAL_CAPSULE | Freq: Every day | ORAL | 0 refills | 30 days | Status: CP
Start: 2021-08-16 — End: 2021-09-15

## 2021-08-24 DIAGNOSIS — K769 Liver disease, unspecified: Principal | ICD-10-CM

## 2021-08-24 DIAGNOSIS — B181 Chronic viral hepatitis B without delta-agent: Principal | ICD-10-CM

## 2021-08-24 DIAGNOSIS — K7460 Unspecified cirrhosis of liver: Principal | ICD-10-CM

## 2021-08-29 DIAGNOSIS — K7469 Other cirrhosis of liver: Principal | ICD-10-CM

## 2021-08-29 DIAGNOSIS — K766 Portal hypertension: Principal | ICD-10-CM

## 2021-08-29 DIAGNOSIS — I8510 Secondary esophageal varices without bleeding: Principal | ICD-10-CM

## 2021-08-29 DIAGNOSIS — B181 Chronic viral hepatitis B without delta-agent: Principal | ICD-10-CM

## 2021-08-29 MED ORDER — NADOLOL 20 MG TABLET
ORAL_TABLET | 0 refills | 0 days
Start: 2021-08-29 — End: ?

## 2021-08-30 MED ORDER — LITHIUM CARBONATE ER 450 MG TABLET,EXTENDED RELEASE
ORAL_TABLET | 0 refills | 0 days | Status: CP
Start: 2021-08-30 — End: ?

## 2021-08-30 NOTE — Unmapped (Signed)
[facility] Refill Coordination Note    Specialty Medication(s) to be Shipped:   Infectious Disease: Vemlidy    Other medication(s) to be shipped: No additional medications requested for fill at this time     Nicholas [name], DOB: Oct 03, 1955  Phone: [phone] (home) [phone] (work)      All above HIPAA information was verified with patient.     Was a Nurse, learning disability used for this call? No    Completed refill call assessment today to schedule patient's medication shipment from the [facility] [phone]).  All relevant notes have been reviewed.     Specialty medication(s) and dose(s) confirmed: Regimen is correct and unchanged.   Changes to medications: Damico reports no changes at this time.  Changes to insurance: No  New side effects reported not previously addressed with a pharmacist or physician: None reported  Questions for the pharmacist: No    Confirmed patient received a Conservation officer, historic buildings and a Surveyor, mining with first shipment. The patient will receive a drug information handout for each medication shipped and additional FDA Medication Guides as required.       DISEASE/MEDICATION-SPECIFIC INFORMATION        N/A    SPECIALTY MEDICATION ADHERENCE     Medication Adherence    Patient reported X missed doses in the last month: 0  Specialty Medication: vemlidy 25mg   Patient is on additional specialty medications: No  Patient is on more than two specialty medications: No  Any gaps in refill history greater than 2 weeks in the last 3 months: no  Demonstrates understanding of importance of adherence: yes              Were doses missed due to medication being on hold? No    Vemlidy 25 mg: 7  days of medicine on hand     REFERRAL TO PHARMACIST     Referral to the pharmacist: Not needed      [facility]     Shipping address confirmed in Epic.     Delivery Scheduled: Yes, Expected medication delivery date: 09/02/21 .     Medication will be delivered via UPS to the prescription address in Epic WAM.    Nicholas [name]   [facility] Specialty Technician

## 2021-09-01 MED FILL — VEMLIDY 25 MG TABLET: ORAL | 30 days supply | Qty: 30 | Fill #1

## 2021-09-05 MED ORDER — DOXEPIN 3 MG TABLET
ORAL_TABLET | Freq: Every evening | ORAL | 0 refills | 0.00000 days
Start: 2021-09-05 — End: ?

## 2021-09-06 DIAGNOSIS — K766 Portal hypertension: Principal | ICD-10-CM

## 2021-09-06 DIAGNOSIS — B181 Chronic viral hepatitis B without delta-agent: Principal | ICD-10-CM

## 2021-09-06 DIAGNOSIS — I8510 Secondary esophageal varices without bleeding: Principal | ICD-10-CM

## 2021-09-06 DIAGNOSIS — K7469 Other cirrhosis of liver: Principal | ICD-10-CM

## 2021-09-06 MED ORDER — DOXEPIN 3 MG TABLET
ORAL_TABLET | Freq: Every evening | ORAL | 0 refills | 0.00000 days | Status: CP
Start: 2021-09-06 — End: ?

## 2021-09-06 MED ORDER — NADOLOL 20 MG TABLET
ORAL_TABLET | Freq: Every day | ORAL | 0 refills | 0.00000 days | Status: CP
Start: 2021-09-06 — End: 2022-03-05

## 2021-09-06 NOTE — Unmapped (Signed)
Refilled today (5/2) by Vallery Sa, NP

## 2021-09-06 NOTE — Unmapped (Signed)
From: Holli Humbles  To: Earley Abide, ANP  Sent: 09/06/2021 8:55 AM EDT  Subject: Medication Renewal Request    Refills have been requested for the following medications:     nadoloL (CORGARD) 20 MG tablet Murriel Hopper Hali Balgobin]   Patient Comment: [facility] has said they have no refills. I have completely run out of this. [facility] has told me that they have requested 5 times for an update on refills, and I have requested and called, is there something else I should do. I was told not to stop taking this medication without being told to by the doctor and I took my last pill yesterday. Who should I contact for assistance?    Preferred pharmacy: [facility] [phone] - [place], [place] - [address] AT [facility] [address]  Delivery method: Pickup

## 2021-09-07 MED ORDER — DOXEPIN 3 MG TABLET
ORAL_TABLET | Freq: Every evening | ORAL | 0 refills | 0 days
Start: 2021-09-07 — End: ?

## 2021-09-08 MED ORDER — DOXEPIN 3 MG TABLET
ORAL_TABLET | Freq: Every evening | ORAL | 0 refills | 0 days | Status: CP
Start: 2021-09-08 — End: ?

## 2021-09-11 MED ORDER — VENLAFAXINE ER 37.5 MG CAPSULE,EXTENDED RELEASE 24 HR
ORAL_CAPSULE | 0 refills | 0 days
Start: 2021-09-11 — End: ?

## 2021-09-12 MED ORDER — VENLAFAXINE ER 37.5 MG CAPSULE,EXTENDED RELEASE 24 HR
ORAL_CAPSULE | Freq: Every day | ORAL | 0 refills | 30 days | Status: CP
Start: 2021-09-12 — End: ?

## 2021-09-13 MED ORDER — HYDROXYZINE HCL 25 MG TABLET
ORAL_TABLET | 0 refills | 0 days
Start: 2021-09-13 — End: ?

## 2021-09-14 MED ORDER — HYDROXYZINE HCL 25 MG TABLET
ORAL_TABLET | 0 refills | 0 days | Status: CP
Start: 2021-09-14 — End: ?

## 2021-09-15 ENCOUNTER — Telehealth
Admit: 2021-09-15 | Discharge: 2021-09-16 | Payer: MEDICARE | Attending: Student in an Organized Health Care Education/Training Program | Primary: Student in an Organized Health Care Education/Training Program

## 2021-09-15 DIAGNOSIS — F319 Bipolar disorder, unspecified: Secondary | ICD-10-CM | POA: Diagnosis not present

## 2021-09-15 DIAGNOSIS — F419 Anxiety disorder, unspecified: Secondary | ICD-10-CM | POA: Diagnosis not present

## 2021-09-15 MED ORDER — VRAYLAR 1.5 MG CAPSULE
ORAL_CAPSULE | Freq: Every day | ORAL | 0 refills | 30 days | Status: CP
Start: 2021-09-15 — End: 2021-10-15

## 2021-09-15 MED ORDER — HYDROXYZINE HCL 25 MG TABLET
ORAL_TABLET | Freq: Two times a day (BID) | ORAL | 0 refills | 30 days | Status: CP | PRN
Start: 2021-09-15 — End: ?

## 2021-09-15 NOTE — Unmapped (Signed)
Follow-up instructions:  --Try to find some sources of social connection and activities outside the house.  --INCREASE effexor to 75 mg daily (can take 2 of the 37.5 mg tablets until you run out). Let me know if any side effects occur or any signs concerning for mania including sleep disturbance, irritability, etc  --Can take 1/2 (12.5) to 1 (25) mg of hydroxyzine (atarax) as needed for anxiety or for times of increased depression and feeling hollow and empty. Possible that this might give you some relief. You could also consider using paced breathing or progressive muscle relaxation techniques.  --Could consider therapy     -- Please continue taking your medications as prescribed for your mental health.   -- Do not make changes to your medications, including taking more or less than prescribed, unless under the supervision of your physician. Be aware that some medications may make you feel worse if abruptly stopped  -- Please refrain from using illicit substances, as these can affect your mood and could cause anxiety or other concerning symptoms.   -- Seek further medical care for any increase in symptoms or new symptoms such as thoughts of wanting to hurt yourself or hurt others.     Contact info:  Life-threatening emergencies: call 911 or go to the nearest ER for medical or psychiatric attention.     Issues that need urgent attention but are not life threatening: call the clinic outpatient frontdesk at [phone] for assistance.     Non-urgent routine concerns, questions, and refill requests: please send me a mychart message and I will get back to you within 2 business days. If you prefer to call, please call the front desk at [phone].    Regarding appointments:  - If you need to cancel your appointment, we ask that you call [phone] at least 24 hours before your scheduled appointment.  - If for any reason you arrive 15 minutes later than your scheduled appointment time, you may not be seen and your visit may be rescheduled.  - Please remember that we will not automatically reschedule missed appointments.  - If you miss two (2) appointments without letting us know in advance, you will likely be referred to a provider in your community.  - We will do our best to be on time. Sometimes an emergency will arise that might cause your clinician to be late. We will try to inform you of this when you check in for your appointment. If you wait more than 15 minutes past your appointment time without such notice, please speak with the front desk staff.    In the event of bad weather, the clinic staff will attempt to contact you, should your appointment need to be rescheduled. Additionally, you can call the Patient Weather Line [phone] for system-wide clinic status    For more information and reminders regarding clinic policies (these were provided when you were admitted to the clinic), please ask the front desk.

## 2021-09-15 NOTE — Unmapped (Signed)
[facility]  Psychiatry   Established Patient E&M Service - Outpatient       Assessment:    Nicholas [name] presents for follow-up evaluation. Patient messaged me since last appointment to say he was feeling more down. Today, reports that he has not noticed any SE from effexor but has also not had any benefit up to this point. He reports that he is still having similar sx of depression to prior. However, notes that he is now also having periods of 2+ hrs in which he will feel hollow, empty and dark. Denies any anxiety during those times, but states he does feel pressure. Not clear if this is a physical or psychiatric sx (he reports sometimes it feels like the pressure is located in his head). He is denying any sx of mania at present. Denies any irritability. Though Nicholas [name] (prior provider) stated that Nicholas [name] had irritability, patient and husband deny recalling any irritability in the past (aside from when patient was manic). Patient's husband does feel depression is worsening and notes Nicholas [name] has spent more time in bed recently. We did discuss some non-medication options to help w/ depression including increasing social activity, engaging in therapy.  Additionally, will recommend increasing effexor today. Will plan to follow-up in ~1.5 weeks to assess medication tolerability. If he is tolerating well at that time (and not having irritability/sleep disturbance/other mania sx) would then plan to increase again to 150 mg daily. I did also suggest trying hydroxyzine PRN (12.5-25 mg at a time) when these periods of increased depression and feeling hollow come up to see if that may provide some relief. There were no safety concerns today, but will certainly continue monitoring closely given worsening depression.     Identifying Information:  Nicholas [name] is a 66 y.o. male with a history of  Bipolar I Disorder that was diagnosed in 2003. He established with this clinic in September 2022. At that time, he was prescribed vraylar, lithium, doxepin, and xanax with fairly good efficacy and tolerability.     Risk Assessment:  A full psychiatric risk assessment was conducted on 01/31/21 and risks do not appear significantly changed from that visit.   While future psychiatric events cannot be accurately predicted, the patient does not currently require acute inpatient psychiatric care and does not currently meet [facility] involuntary commitment criteria.      Plan:    #Bipolar I Disorder:  --Continue Lithium ER 450 mg QAM, 900 mg at bedtime. Recent level of 0.8.  --Continue Vraylar 3 mg nightly   --Continue Doxepin 3 mg likely plan for continued taper  --Continue hydroxyzine 25 mg BID PRN. Currently taking 25 mg nightly which he notes is helping w/ sleep. Suggested taking 12.5-25 PRN for anxiety or episodes of depression/hollowness.  --INCREASE effexor to 75 mg (s 4/11,i5/12). Likely will increase again next visit if patient tolerating.  --Discussed behavioral activation today, Nicholas [name] planning to try and get more plugged in with his local community (maybe a church).  --Recommend discussing PT with PCP to help w/ his unsteadiness on feet/hoping this could increase activity level.  --Consider referral for therapy, pt has not been interested in this     #Medication monitoring:   --Ordered BMP today  --Recent Cr of 0.8 per careeverywhere records  Lab Results   Component Value Date    Cholesterol 136 06/06/2021    LDL Calculated 67 06/06/2021    HDL 33 (L) 06/06/2021    Triglycerides 181 (H) 06/06/2021  Hemoglobin A1C:   Lab Results   Component Value Date    Hemoglobin A1C 6.8 01/12/2021      Fasting Blood Sugar:   Lab Results   Component Value Date    Glucose 93 10/30/2019     Lithium Lvl   Date Value Ref Range Status   06/06/2021 0.8 0.5 - 1.2 mmol/L Final     TSH   Date Value Ref Range Status   06/06/2021 3.535 0.550 - 4.780 uIU/mL Final       Psychotherapy provided:  No billable psychotherapy service provided but brief supportive therapy was utilized.    Patient has been given this writer's contact information as well as the [facility] urgent line number. The patient has been instructed to call 911 for emergencies.    Subjective:    Interval History:   Pt states that I've been better. States that he is feeling hollow and empty and dark. Reports that this happens on and off during the day. He notes that this is occuring daily now. These started about a week ago. Unclear if there was any trigger for this starting. States that he is having some hot flashes recently. Reports he is doing OK physically. He reports his diabetes management is going well. He reports that he is still having diarrhea, which is being treated w/ lomotil.     Reports periods of feeling down last for hours. Baseline mood is lower than normal. He is taking hydroxyzine 25 mg nightly to help w/ sleep. Denies medication change. Denies missing doses of his medication. He notes that he sometimes has feeling of pressure in my head. Sometimes these down periods sometimes occur when he wakes up in the morning. He states he is not having a lot of anxiety lately. That actually seems to be a lot better than it was before.    He is now back on effexor. Not having any SE. Not noticing any positive effects yet. Denies any irritability recently. Denies any concerns for mania or sleep disturbance.     Denies any recent hopelessness, SI.     He has not been engaging in many acitivities lately. He did well when Nicholas [name] was home and they enjoyed that.     Nicholas [name] joined for the end of the call. He reports that Nicholas [name] has had been depressed. He has had trouble of getting out of bed. He is feeling more sad this week. Nicholas [name] reports that he doesn't recall any past irritability though Nicholas [name] has been agitated in the setting of mania in the past.    Discussed POC (See assessment above for details). No other Q's or concerns today.     Objective:    Mental Status Exam:  Appearance:    Appears stated age, Well nourished, Well developed and Clean/Neat   Motor:   No abnormal movements   Speech/Language:    Normal rate, volume, tone, fluency and Language intact, well formed   Mood:   Low   Affect:   Constricted   Thought process and Associations:   Logical, linear, clear, coherent, goal directed   Abnormal/psychotic thought content:    Mood persistently down, but does have periods where mood is lower, he feels hollow and empty. Not having significant anxiety. Denies SI, hopelessness. Remains future oriented. Not voicing any bizarre or delusional thought content on interview.   Perceptual disturbances:      He is not endorsing any AVH. No RIS on interview.     Other:  Visit was completed by video (or phone) and the appropriate disclaimer has been included below.    I personally spent 40 minutes face-to-face and non-face-to-face in the care of this patient, which includes all pre, intra, and post visit time on the date of service.      The patient reports they are currently: at home. I spent 30 minutes on the real-time audio and video with the patient on the date of service. I spent an additional 10 minutes on pre- and post-visit activities on the date of service.     The patient was physically located in [place] or a state in which I am permitted to provide care. The patient and/or parent/guardian understood that s/he may incur co-pays and cost sharing, and agreed to the telemedicine visit. The visit was reasonable and appropriate under the circumstances given the patient's presentation at the time.    The patient and/or parent/guardian has been advised of the potential risks and limitations of this mode of treatment (including, but not limited to, the absence of in-person examination) and has agreed to be treated using telemedicine. The patient's/patient's family's questions regarding telemedicine have been answered.     If the visit was completed in an ambulatory setting, the patient and/or parent/guardian has also been advised to contact their provider???s office for worsening conditions, and seek emergency medical treatment and/or call 911 if the patient deems either necessary.        Nicholas Bulla, MD

## 2021-09-23 NOTE — Unmapped (Signed)
[facility] Refill Coordination Note    Specialty Medication(s) to be Shipped:   Infectious Disease: Vemlidy    Other medication(s) to be shipped: No additional medications requested for fill at this time     Theodore Virgin, DOB: [birth year]  Phone: [phone] (home) [phone] (work)      All above HIPAA information was verified with patient.     Was a Nurse, learning disability used for this call? No    Completed refill call assessment today to schedule patient's medication shipment from the [facility] [phone]).  All relevant notes have been reviewed.     Specialty medication(s) and dose(s) confirmed: Regimen is correct and unchanged.   Changes to medications: Waldon reports no changes at this time.  Changes to insurance: No  New side effects reported not previously addressed with a pharmacist or physician: None reported  Questions for the pharmacist: No    Confirmed patient received a Conservation officer, historic buildings and a Surveyor, mining with first shipment. The patient will receive a drug information handout for each medication shipped and additional FDA Medication Guides as required.       DISEASE/MEDICATION-SPECIFIC INFORMATION        N/A    SPECIALTY MEDICATION ADHERENCE     Medication Adherence    Patient reported X missed doses in the last month: 0  Specialty Medication: vemlidy 25mg   Patient is on additional specialty medications: No  Any gaps in refill history greater than 2 weeks in the last 3 months: no  Demonstrates understanding of importance of adherence: yes  Informant: patient  Reliability of informant: reliable  Confirmed plan for next specialty medication refill: delivery by pharmacy  Refills needed for supportive medications: not needed              Were doses missed due to medication being on hold? No    Vemlidy 25 mg: 10 days of medicine on hand     REFERRAL TO PHARMACIST     Referral to the pharmacist: Not needed      [facility]     Shipping address confirmed in Epic.     Delivery Scheduled: Yes, Expected medication delivery date: 09/29/21.     Medication will be delivered via UPS to the prescription address in Epic WAM.    Bader Stubblefield D Deidrick Rainey   [facility] Specialty Technician

## 2021-09-25 MED ORDER — LITHIUM CARBONATE ER 450 MG TABLET,EXTENDED RELEASE
ORAL_TABLET | 0 refills | 0 days
Start: 2021-09-25 — End: ?

## 2021-09-26 ENCOUNTER — Telehealth
Admit: 2021-09-26 | Discharge: 2021-09-27 | Payer: MEDICARE | Attending: Student in an Organized Health Care Education/Training Program | Primary: Student in an Organized Health Care Education/Training Program

## 2021-09-26 DIAGNOSIS — F319 Bipolar disorder, unspecified: Secondary | ICD-10-CM | POA: Diagnosis not present

## 2021-09-26 MED ORDER — VENLAFAXINE ER 150 MG CAPSULE,EXTENDED RELEASE 24 HR
ORAL_CAPSULE | Freq: Every day | ORAL | 0 refills | 30 days | Status: CP
Start: 2021-09-26 — End: 2021-10-26

## 2021-09-26 MED ORDER — HYDROXYZINE HCL 25 MG TABLET
ORAL_TABLET | Freq: Two times a day (BID) | ORAL | 0 refills | 30 days | Status: CP | PRN
Start: 2021-09-26 — End: ?

## 2021-09-26 MED ORDER — VENLAFAXINE ER 75 MG CAPSULE,EXTENDED RELEASE 24 HR
ORAL_CAPSULE | Freq: Every day | ORAL | 0 refills | 30 days | Status: CP
Start: 2021-09-26 — End: 2021-09-26

## 2021-09-26 MED ORDER — LITHIUM CARBONATE ER 450 MG TABLET,EXTENDED RELEASE
ORAL_TABLET | 2 refills | 0 days | Status: CP
Start: 2021-09-26 — End: ?

## 2021-09-26 NOTE — Unmapped (Signed)
Addended by: Marlynn Perking B on: 09/26/2021 12:08 PM     Modules accepted: Orders

## 2021-09-26 NOTE — Unmapped (Signed)
[facility]  Psychiatry   Established Patient E&M Service - Outpatient       Assessment:    Nicholas [name] presents for follow-up evaluation. Today, Nicholas [name] is reporting ongoing depressive sx. Sounds like he has been down, unmotivated and struggling w/ low energy. Reports feeling hopeless at times, but denying any SI. He is tolerating increased dose of effexor without any difficulty. Per patient and husband there have been no signs/symptoms of mania. He is sleeping well. Given this, we discussed increasing effexor to 150 mg daily. Will plan to follow-up in 2 weeks. Continuing to monitor closely given diagnosis of bipolar disorder and risk for activation/switch to mania. We did also again discuss potential for therapy referral. Per patient's husband, individual therapy was very helpful to Nicholas [name] in the past. However, he is hesitant at this time and wants to consider further before pursuing therapy referral.     Identifying Information:  Nicholas [name] is a 66 y.o. male with a history of  Bipolar I Disorder that was diagnosed in 2003. He established with this clinic in September 2022. At that time, he was prescribed vraylar, lithium, doxepin, and xanax with fairly good efficacy and tolerability.     Risk Assessment:  A full psychiatric risk assessment was conducted on 01/31/21 and risks do not appear significantly changed from that visit.   While future psychiatric events cannot be accurately predicted, the patient does not currently require acute inpatient psychiatric care and does not currently meet [facility] involuntary commitment criteria.      Plan:    #Bipolar I Disorder:  --Continue Lithium ER 450 mg QAM, 900 mg at bedtime. Recent level of 0.8.  --Continue Vraylar 3 mg nightly   --Continue Doxepin 3 mg likely plan for continued taper  --Continue hydroxyzine 25 mg BID PRN. Finding helpful.  --INCREASE effexor to 150 mg (s 4/11,i5/12, I5/22).   --Discussed behavioral activation today, [place] planning to try and get more plugged in with his local community (maybe a church).  --Recommend discussing PT with PCP to help w/ his unsteadiness on feet/hoping this could increase activity level.  --Consider referral for therapy     #Medication monitoring:   --Ordered BMP  --Recent Cr of 0.8 per careeverywhere records  Lab Results   Component Value Date    Cholesterol 136 06/06/2021    LDL Calculated 67 06/06/2021    HDL 33 (L) 06/06/2021    Triglycerides 181 (H) 06/06/2021     Hemoglobin A1C:   Lab Results   Component Value Date    Hemoglobin A1C 6.8 01/12/2021      Fasting Blood Sugar:   Lab Results   Component Value Date    Glucose 93 10/30/2019     Lithium Lvl   Date Value Ref Range Status   06/06/2021 0.8 0.5 - 1.2 mmol/L Final     TSH   Date Value Ref Range Status   06/06/2021 3.535 0.550 - 4.780 uIU/mL Final       Psychotherapy provided:  No billable psychotherapy service provided but brief supportive therapy was utilized.    Patient has been given this writer's contact information as well as the [facility] Psychiatry urgent line number. The patient has been instructed to call 911 for emergencies.    Subjective:    Interval History:   Nicholas [name] is present for video visit as well. States that he is between a 4 and 6 on the depression scale. Nicholas [name] reports that patient seems to be more  down when he is out. He reports they have been working on trying to get him doing more during the day. Patient is not eating well. He has lost ~ 8 lbs recently. He is eating a small lunch. He reports that his appetite is low. His mood has been low. Doesn't have very much motivation. Energy is low. [name] comments that energy level is close to none.  He is getting sleep at night. He reports that he does wake up 3-4 times in the night to use the bathroom. That is consistent w/ his baseline. Denies feeling hopeless. States he is somewhere in the middle. Denies any SI. Nicholas [name] reports that they have discussed safety planning. He report sometimes he feels hopeless about getting better. Nicholas [name] reports that he does worry about his overall health and gets upset by physical sx (like his frequent diarrhea).     Denies any new life stressors.     Reports that he is feeling less motivated to do self-care (like showering, shaving). He is still showering.     Still taking meds consistently. Denies any SE. He is taking effexor 75 mg dose. He is not reporting significant SE on this dose. He is not noticing any irritability, sleep disturbance.    He has had therapy in the past. Notes he has had good experiences w/ therapy in the past. He has done better w/ individual therapy than with group therapy. Nicholas [name] feels like it was really helpful in the past.     He would like to consider virtual therapy. Will let me know.     Discussed POC (see assessment above for details). No other Q's or concerns today.     Objective:    Mental Status Exam:  Appearance:    Appears stated age, Well nourished, Well developed and Clean/Neat   Motor:   No abnormal movements   Speech/Language:    Normal rate, volume, tone, fluency and Language intact, well formed   Mood:   Low, 4-6/10 if 10 is the worst depression   Affect:   Constricted   Thought process and Associations:   Logical, linear, clear, coherent, goal directed   Abnormal/psychotic thought content:    Mood is low, low motivation, low energy. Not reporting significant anxiety. Denies SI, persistent hopelessness. Does report intermittently feeling hopeless. Remains future oriented. Not voicing any bizarre or delusional thought content on interview.   Perceptual disturbances:      He is not endorsing any AVH. No RIS on interview.     Other:        PHQ-9 PHQ-9 TOTAL SCORE   09/15/2021   8:40 PM 17         Visit was completed by video (or phone) and the appropriate disclaimer has been included below.    I personally spent 47 minutes face-to-face and non-face-to-face in the care of this patient, which includes all pre, intra, and post visit time on the date of service.      The patient reports they are currently: at home. I spent 37 minutes on the real-time audio and video with the patient on the date of service. I spent an additional 10 minutes on pre- and post-visit activities on the date of service.     The patient was physically located in [place] or a state in which I am permitted to provide care. The patient and/or parent/guardian understood that s/he may incur co-pays and cost sharing, and agreed to the telemedicine visit. The visit was reasonable and  appropriate under the circumstances given the patient's presentation at the time.    The patient and/or parent/guardian has been advised of the potential risks and limitations of this mode of treatment (including, but not limited to, the absence of in-person examination) and has agreed to be treated using telemedicine. The patient's/patient's family's questions regarding telemedicine have been answered.     If the visit was completed in an ambulatory setting, the patient and/or parent/guardian has also been advised to contact their provider???s office for worsening conditions, and seek emergency medical treatment and/or call 911 if the patient deems either necessary.          Dennison Bulla, MD

## 2021-09-26 NOTE — Unmapped (Signed)
Follow-up instructions:  --INCREASE effexor (venlafaxine) to 150 mg daily  --Let me know if you'd like a referral for therapy.  --As hard as it is while you are feeling down, try to stay active as much as you can and find some activities that bring you joy/pleasure or a sense of accomplishment. You can try setting a small goal each week (for example you could plan to attend a new church or get 10 minutes of physical activity 3 times during the week).  -- Please continue taking your medications as prescribed for your mental health.   -- Do not make changes to your medications, including taking more or less than prescribed, unless under the supervision of your physician. Be aware that some medications may make you feel worse if abruptly stopped  -- Please refrain from using illicit substances, as these can affect your mood and could cause anxiety or other concerning symptoms.   -- Seek further medical care for any increase in symptoms or new symptoms such as thoughts of wanting to hurt yourself or hurt others.     Contact info:  Life-threatening emergencies: call 911 or go to the nearest ER for medical or psychiatric attention.     Issues that need urgent attention but are not life threatening: call the clinic outpatient frontdesk at [phone] for assistance.     Non-urgent routine concerns, questions, and refill requests: please send me a mychart message and I will get back to you within 2 business days. If you prefer to call, please call the front desk at [phone].    Regarding appointments:  - If you need to cancel your appointment, we ask that you call [phone] at least 24 hours before your scheduled appointment.  - If for any reason you arrive 15 minutes later than your scheduled appointment time, you may not be seen and your visit may be rescheduled.  - Please remember that we will not automatically reschedule missed appointments.  - If you miss two (2) appointments without letting us know in advance, you will likely be referred to a provider in your community.  - We will do our best to be on time. Sometimes an emergency will arise that might cause your clinician to be late. We will try to inform you of this when you check in for your appointment. If you wait more than 15 minutes past your appointment time without such notice, please speak with the front desk staff.    In the event of bad weather, the clinic staff will attempt to contact you, should your appointment need to be rescheduled. Additionally, you can call the Patient Weather Line [phone] for system-wide clinic status    For more information and reminders regarding clinic policies (these were provided when you were admitted to the clinic), please ask the front desk.

## 2021-09-28 MED FILL — VEMLIDY 25 MG TABLET: ORAL | 30 days supply | Qty: 30 | Fill #2

## 2021-10-03 MED ORDER — VENLAFAXINE ER 37.5 MG CAPSULE,EXTENDED RELEASE 24 HR
ORAL_CAPSULE | 0 refills | 0 days
Start: 2021-10-03 — End: ?

## 2021-10-04 MED ORDER — VENLAFAXINE ER 37.5 MG CAPSULE,EXTENDED RELEASE 24 HR
ORAL_CAPSULE | 0 refills | 0 days
Start: 2021-10-04 — End: ?

## 2021-10-06 ENCOUNTER — Ambulatory Visit: Payer: Medicare HMO | Admitting: "Endocrinology

## 2021-10-10 ENCOUNTER — Telehealth
Admit: 2021-10-10 | Discharge: 2021-10-11 | Payer: MEDICARE | Attending: Student in an Organized Health Care Education/Training Program | Primary: Student in an Organized Health Care Education/Training Program

## 2021-10-10 DIAGNOSIS — Z79899 Other long term (current) drug therapy: Secondary | ICD-10-CM | POA: Diagnosis not present

## 2021-10-10 DIAGNOSIS — F319 Bipolar disorder, unspecified: Secondary | ICD-10-CM | POA: Diagnosis not present

## 2021-10-10 MED ORDER — DOXEPIN 3 MG TABLET
ORAL_TABLET | Freq: Every evening | ORAL | 0 refills | 0 days | Status: CP
Start: 2021-10-10 — End: ?

## 2021-10-10 NOTE — Unmapped (Signed)
[facility]  Psychiatry   Established Patient E&M Service - Outpatient       Assessment:    Nicholas [name] presents for follow-up evaluation. Today, Nicholas [name] reports ongoing depressive sx including low mood, low energy and low motivation. Likely also sleeping in excess lately. He does feel that mood is improving somewhat. Has had more days with fair mood than prior (probably having this 1/4 of the time). He is feeling less hopeless recently. He did have some recent better days where he enjoyed things in the context of his husband having time off work. He is taking medications consistently and is tolerating them well. He is denying any current concerns for mania/hypomania. Denies recent irritability. Given recent increase in effexor, good tolerability and some improvement in mood discussed continuing at current dose for now. He was amenable to this. We reviewed that therapy, behavioral activation may also be useful in treating depressive sx. Currently he is not interested in therapy. We will plan for follow-up in 3 weeks. Patient to reach out prior to that time if symptoms worsen or other concerns arise.    Identifying Information:  Nicholas [name] is a 66 y.o. male with a history of  Bipolar I Disorder that was diagnosed in 2003. He established with this clinic in September 2022. At that time, he was prescribed vraylar, lithium, doxepin, and xanax with fairly good efficacy and tolerability.     Risk Assessment:  A full psychiatric risk assessment was conducted on 01/31/21 and risks do not appear significantly changed from that visit.   While future psychiatric events cannot be accurately predicted, the patient does not currently require acute inpatient psychiatric care and does not currently meet [facility] involuntary commitment criteria.      Plan:    #Bipolar I Disorder:  --Continue Lithium ER 450 mg QAM, 900 mg at bedtime. Recent level of 0.8.  --Continue Vraylar 3 mg nightly   --Continue Doxepin 3 mg likely plan for continued taper  --Continue hydroxyzine 25 mg BID PRN.   --Continue effexor 150 mg (s 4/11,i5/12, I5/22).   --Discussed behavioral activation today, Have previously discussed getting more plugged in with local community (maybe a church).  --Recommend discussing PT with PCP to help w/ his unsteadiness on feet/hoping this could increase activity level.  --Consider referral for therapy     #Medication monitoring:   --Ordered BMP  --Recent Cr of 0.8 per careeverywhere records  Lab Results   Component Value Date    Cholesterol 136 06/06/2021    LDL Calculated 67 06/06/2021    HDL 33 (L) 06/06/2021    Triglycerides 181 (H) 06/06/2021     Hemoglobin A1C:   Lab Results   Component Value Date    Hemoglobin A1C 6.8 01/12/2021      Fasting Blood Sugar:   Lab Results   Component Value Date    Glucose 93 10/30/2019     Lithium Lvl   Date Value Ref Range Status   06/06/2021 0.8 0.5 - 1.2 mmol/L Final     TSH   Date Value Ref Range Status   06/06/2021 3.535 0.550 - 4.780 uIU/mL Final       Psychotherapy provided:  No billable psychotherapy service provided but brief supportive therapy was utilized.    Patient has been given this writer's contact information as well as the [facility] Psychiatry urgent line number. The patient has been instructed to call 911 for emergencies.    Subjective:    Interval History:  Pt states that he is doing alright. States that he is ranging 3-6/10 (if 10 is bad depression). States that he feels not too bad 1/4 of the time. He reports that he is feeling a little more hopeful lately. States that he and Nicholas [name] have been fairly active. States Nicholas [name] has been off a few days so they walked, antiqued. He did enjoy these activities. States I enjoy getting out of the house. He hasn't felt too motivated to do things when [employer] around. Energy has been low. States sleep is disturbed by frequent awakenings to use the bathroom. States he is sleeping from 12-4:30 and then will go back to bed from 6-11/12. He is taking melatonin which seems to help w/ falling asleep.     Reviewed med list. Denies any missed doses recently. Denies problems w/ meds or SE.     Denies any recent irritability. Denies any concerns for mania.     Denies any SI.     He is willing to give effexor more time. Denies any other Q's or concerns.        Objective:    Mental Status Exam:  Appearance:    Appears stated age, Well nourished, Well developed and Clean/Neat   Motor:   No abnormal movements   Speech/Language:    Normal rate, volume, tone, fluency and Language intact, well formed   Mood:   3-6/10 (w/ 10 being worst depression)   Affect:   Constricted   Thought process and Associations:   Logical, linear, clear, coherent, goal directed   Abnormal/psychotic thought content:    Mood is low, low motivation, low energy. Not reporting significant anxiety. Denies SI. Feeling a little more hopeful recently. Remains future oriented. Denies irritability. Not voicing any bizarre or delusional thought content on interview.   Perceptual disturbances:      He is not endorsing any AVH. No RIS on interview.     Other:        PHQ-9 PHQ-9 TOTAL SCORE   09/15/2021   8:40 PM 17         Visit was completed by video (or phone) and the appropriate disclaimer has been included below.    I personally spent 30 minutes face-to-face and non-face-to-face in the care of this patient, which includes all pre, intra, and post visit time on the date of service.  All documented time was specific to the E/M visit and does not include any procedures that may have been performed.      The patient reports they are currently: at home. I spent 20 minutes on the real-time audio and video with the patient on the date of service. I spent an additional 10 minutes on pre- and post-visit activities on the date of service.     The patient was physically located in [place] or a state in which I am permitted to provide care. The patient and/or parent/guardian understood that s/he may incur co-pays and cost sharing, and agreed to the telemedicine visit. The visit was reasonable and appropriate under the circumstances given the patient's presentation at the time.    The patient and/or parent/guardian has been advised of the potential risks and limitations of this mode of treatment (including, but not limited to, the absence of in-person examination) and has agreed to be treated using telemedicine. The patient's/patient's family's questions regarding telemedicine have been answered.     If the visit was completed in an ambulatory setting, the patient and/or parent/guardian has also been advised  to contact their provider???s office for worsening conditions, and seek emergency medical treatment and/or call 911 if the patient deems either necessary.        Dennison Bulla, MD

## 2021-10-13 ENCOUNTER — Other Ambulatory Visit: Payer: Self-pay | Admitting: Nurse Practitioner

## 2021-10-24 MED ORDER — VENLAFAXINE ER 150 MG CAPSULE,EXTENDED RELEASE 24 HR
ORAL_CAPSULE | Freq: Every day | ORAL | 0 refills | 30 days | Status: CP
Start: 2021-10-24 — End: 2021-11-23

## 2021-10-25 MED ORDER — VENLAFAXINE ER 150 MG CAPSULE,EXTENDED RELEASE 24 HR
ORAL_CAPSULE | 0 refills | 0 days
Start: 2021-10-25 — End: ?

## 2021-10-25 NOTE — Unmapped (Signed)
[facility] Refill Coordination Note    Specialty Medication(s) to be Shipped:   Infectious Disease: Vemlidy    Other medication(s) to be shipped: No additional medications requested for fill at this time     Nicholas [name], DOB: April 21, 1956  Phone: [phone] (home) [phone] (work)      All above HIPAA information was verified with patient.     Was a Nurse, learning disability used for this call? No    Completed refill call assessment today to schedule patient's medication shipment from the [facility] [phone]).  All relevant notes have been reviewed.     Specialty medication(s) and dose(s) confirmed: Regimen is correct and unchanged.   Changes to medications: [place] reports no changes at this time.  Changes to insurance: No  New side effects reported not previously addressed with a pharmacist or physician: None reported  Questions for the pharmacist: No    Confirmed patient received a Conservation officer, historic buildings and a Surveyor, mining with first shipment. The patient will receive a drug information handout for each medication shipped and additional FDA Medication Guides as required.       DISEASE/MEDICATION-SPECIFIC INFORMATION        N/A    SPECIALTY MEDICATION ADHERENCE     Medication Adherence    Patient reported X missed doses in the last month: 0  Specialty Medication: vemlidy 25mg   Patient is on additional specialty medications: No  Any gaps in refill history greater than 2 weeks in the last 3 months: no  Demonstrates understanding of importance of adherence: yes  Informant: patient  Reliability of informant: reliable  Confirmed plan for next specialty medication refill: delivery by pharmacy  Refills needed for supportive medications: not needed              Were doses missed due to medication being on hold? No    Vemlidy 25 mg: 10 days of medicine on hand     REFERRAL TO PHARMACIST     Referral to the pharmacist: Not needed      [facility]     Shipping address confirmed in Epic.     Delivery Scheduled: Yes, Expected medication delivery date: 10/28/21.     Medication will be delivered via UPS to the prescription address in Epic WAM.    Brette Cast D Hector Taft   [facility] Specialty Technician

## 2021-10-27 ENCOUNTER — Other Ambulatory Visit: Payer: Self-pay | Admitting: Nurse Practitioner

## 2021-10-27 MED FILL — VEMLIDY 25 MG TABLET: ORAL | 30 days supply | Qty: 30 | Fill #3

## 2021-10-31 ENCOUNTER — Telehealth
Admit: 2021-10-31 | Discharge: 2021-11-01 | Payer: MEDICARE | Attending: Student in an Organized Health Care Education/Training Program | Primary: Student in an Organized Health Care Education/Training Program

## 2021-10-31 DIAGNOSIS — F319 Bipolar disorder, unspecified: Secondary | ICD-10-CM | POA: Diagnosis not present

## 2021-10-31 NOTE — Unmapped (Signed)
Follow-up instructions:  -- Please continue taking your medications as prescribed for your mental health.   -- Do not make changes to your medications, including taking more or less than prescribed, unless under the supervision of your physician. Be aware that some medications may make you feel worse if abruptly stopped  -- Please refrain from using illicit substances, as these can affect your mood and could cause anxiety or other concerning symptoms.   -- Seek further medical care for any increase in symptoms or new symptoms such as thoughts of wanting to hurt yourself or hurt others.     Contact info:  Life-threatening emergencies: call 911 or go to the nearest ER for medical or psychiatric attention.     Issues that need urgent attention but are not life threatening: call the clinic outpatient frontdesk at [phone] for assistance.     Non-urgent routine concerns, questions, and refill requests: please send me a mychart message and I will get back to you within 2 business days. If you prefer to call, please call the front desk at [phone].    Regarding appointments:  - If you need to cancel your appointment, we ask that you call [phone] at least 24 hours before your scheduled appointment.  - If for any reason you arrive 15 minutes later than your scheduled appointment time, you may not be seen and your visit may be rescheduled.  - Please remember that we will not automatically reschedule missed appointments.  - If you miss two (2) appointments without letting us know in advance, you will likely be referred to a provider in your community.  - We will do our best to be on time. Sometimes an emergency will arise that might cause your clinician to be late. We will try to inform you of this when you check in for your appointment. If you wait more than 15 minutes past your appointment time without such notice, please speak with the front desk staff.    In the event of bad weather, the clinic staff will attempt to contact you, should your appointment need to be rescheduled. Additionally, you can call the Patient Weather Line [phone] for system-wide clinic status    For more information and reminders regarding clinic policies (these were provided when you were admitted to the clinic), please ask the front desk.

## 2021-10-31 NOTE — Unmapped (Signed)
[name]  Psychiatry   Established Patient E&M Service - Outpatient       Assessment:    Nicholas [name] presents for follow-up evaluation. Today, Nicholas [name] reports similar symptoms to prior. Still having a lot of trouble w/ low mood, low motivation, some anhedonia, excessive sleep and low energy. Patient and his husband report that symptoms do not seem to improved at all since starting effexor. Given no clear response to effexor, we discussed some options for managing depression. Patient's husband did report that he recalls Nicholas [name] being on a much higher dose of effexor in the past (up to 600 mg per his report which would be pretty atpyical). We discussed that given there has been seemingly no response to a significant trial at 150 mg, it would be less likely that he would respond to additional titration with this medication. Also discussed that particularly given BPAD diagnosis, would be pretty cautious to exceed FDA maximum of 225 mg. Discussed potential options including titration of Vraylar, transition to another antipsychotic medication or transition to alternative antidepressant. Spent some time today reviewing prior medication trials. Patient and husband report he previously responded very well to risperidone (both oral and LAI). Unclear why this medication was stopped (they note he has live disease). Given report of very good response w/ risperidone in the past, I think it would be beneficial to explore this option further. I would like to try and clarify reason for discontinuation of risperidone in the past. Will reach out to previous provider and have asked pt to request records from them as well. Will plan to research a bit regarding any issues with risperidone in liver disease. We also discussed therapy referral. Nicholas [name] was willing to be referred for resident CBT. For now, we are going to continue medications as currently prescribed. Ultimately likely want to taper effexor given lack of efficacy. However, will defer any medication changes pending some more investigation into past risperidone trial. Will plan for follow-up next week to consider options further.    Identifying Information:  Nicholas [name] is a 66 y.o. male with a history of  Bipolar I Disorder that was diagnosed in 2003. He established with this clinic in September 2022. At that time, he was prescribed vraylar, lithium, doxepin, and xanax with fairly good efficacy and tolerability.     Risk Assessment:  A full psychiatric risk assessment was conducted on 01/31/21 and risks do not appear significantly changed from that visit.   While future psychiatric events cannot be accurately predicted, the patient does not currently require acute inpatient psychiatric care and does not currently meet [facility] [name] involuntary commitment criteria.      Plan:    #Bipolar I Disorder:  --Continue Lithium ER 450 mg QAM, 900 mg at bedtime. Recent level of 0.8.  --Continue Vraylar 3 mg nightly   --May consider titration of Vraylar vs. Transition to another antipsychotic medication (risperidone may be good option given report of past good response to this medication).  --Continue Doxepin 3 mg likely plan for continued taper  --Continue hydroxyzine 25 mg BID PRN.   --Continue effexor 150 mg (s 4/11,i5/12, I5/22). Likely will want to taper given lack of efficacy. May consider trial of another antidepressant (when discussing today seems only other true antidepressant was wellbutrin)  --Discussed behavioral activation today, Have previously discussed getting more plugged in with local community (maybe a church).  --Recommend discussing PT with PCP to help w/ his unsteadiness on feet/hoping this could increase activity level.  --  Consider referral for therapy - Will send in referral for resident CBT today.     #Medication monitoring:   --Ordered BMP  --Recent Cr of 0.8 per careeverywhere records  Lab Results   Component Value Date    Cholesterol 136 06/06/2021 LDL Calculated 67 06/06/2021    HDL 33 (L) 06/06/2021    Triglycerides 181 (H) 06/06/2021     Hemoglobin A1C:   Lab Results   Component Value Date    Hemoglobin A1C 6.8 01/12/2021      Fasting Blood Sugar:   Lab Results   Component Value Date    Glucose 93 10/30/2019     Lithium Lvl   Date Value Ref Range Status   06/06/2021 0.8 0.5 - 1.2 mmol/L Final     TSH   Date Value Ref Range Status   06/06/2021 3.535 0.550 - 4.780 uIU/mL Final       Psychotherapy provided:  No billable psychotherapy service provided but brief supportive therapy was utilized.    Patient has been given this writer's contact information as well as the [name] urgent line number. The patient has been instructed to call 911 for emergencies.    Subjective:    Interval History:  Pt states things are fair. States he is at a 5 most of the time. He and Nicholas [name] feel this is about the same as it was at our last visit. States he has felt drowsy. States he hasn't wanted to get out of the bed. States that if he does get out of bed he feels better. States that he does sometimes wake up feeling down. Reports that he is trying to nap less. He and Nicholas [name] report that last week they spent time outside the house antiquing and that went better/he felt better. Napping for a shorter time is helping some w/ sleep. Still waking up to pee frequently (every 1-2 hrs), but able to fall back asleep eventually. States he does not wake up feeling rested. Sleeps from 11-4:30 and then sleeps from 6-9 AM. He then takes a nap in the afternoon. Denies any sleep apnea history. Denies snoring or stopping breathing in the night.    He has had some improvement in diarrhea lately. He is taking immodium. Notes that this has helped his mood.     Nicholas [name] reports that he is not any better w/ depression. They think he has been up to 600 mg of effexor.     Nicholas [name] reports that Pride will go from 12 months of mania and then he will start to slowly seep into depression after 2-3 months. Then he'll be in depression for ~18 months.     Denies any SI.     Denies any medication SE. Denies any missed doses of medications.    Currently taking 3 mg of Vraylar. Not sure if it has helped. States that he thinks it was added for depression. They do not think he was depressed when he started it. Nicholas [name] states I think it kept him steady.     Reviewed some past medication trials - has tried wellbutrin (no effect), lamictal (rash), abilify, risperdal and risperdal consta. They felt like risperdal consta was really helpful for him. They are not totally sure why risperdal was stopped. Did not do well w/ Seroquel.    Discussed POC (see assessment above for details). Will plan to follow-up next week to try and solidify plan for managing depression going forward. No other Q's or concerns today.  Objective:    Mental Status Exam:  Appearance:    Appears stated age, Well nourished, Well developed and Clean/Neat   Motor:   No abnormal movements   Speech/Language:    Normal rate, volume, tone, fluency and Language intact, well formed   Mood:   The same   Affect:   Constricted   Thought process and Associations:   Logical, linear, clear, coherent, goal directed   Abnormal/psychotic thought content:    Mood is low, low motivation, low energy. Not reporting significant anxiety. Denies SI. Feeling aRemains future oriented. Not voicing any bizarre or delusional thought content on interview.   Perceptual disturbances:      He is not endorsing any AVH. No RIS on interview.     Other:        PHQ-9 PHQ-9 TOTAL SCORE   09/15/2021   8:40 PM 17         Visit was completed by video (or phone) and the appropriate disclaimer has been included below.    I personally spent 75 minutes face-to-face and non-face-to-face in the care of this patient, which includes all pre, intra, and post visit time on the date of service.  All documented time was specific to the E/M visit and does not include any procedures that may have been performed.      The patient reports they are currently: at home. I spent 55 minutes on the real-time audio and video with the patient on the date of service. I spent an additional 20 minutes on pre- and post-visit activities on the date of service.     The patient was physically located in [place] or a state in which I am permitted to provide care. The patient and/or parent/guardian understood that s/he may incur co-pays and cost sharing, and agreed to the telemedicine visit. The visit was reasonable and appropriate under the circumstances given the patient's presentation at the time.    The patient and/or parent/guardian has been advised of the potential risks and limitations of this mode of treatment (including, but not limited to, the absence of in-person examination) and has agreed to be treated using telemedicine. The patient's/patient's family's questions regarding telemedicine have been answered.     If the visit was completed in an ambulatory setting, the patient and/or parent/guardian has also been advised to contact their provider???s office for worsening conditions, and seek emergency medical treatment and/or call 911 if the patient deems either necessary.      Dennison Bulla, MD

## 2021-11-02 ENCOUNTER — Telehealth (INDEPENDENT_AMBULATORY_CARE_PROVIDER_SITE_OTHER): Payer: Medicare HMO | Admitting: Medical

## 2021-11-02 VITALS — Wt 275.0 lb

## 2021-11-02 DIAGNOSIS — Z9181 History of falling: Secondary | ICD-10-CM | POA: Diagnosis not present

## 2021-11-02 DIAGNOSIS — R197 Diarrhea, unspecified: Secondary | ICD-10-CM | POA: Diagnosis not present

## 2021-11-02 DIAGNOSIS — E1142 Type 2 diabetes mellitus with diabetic polyneuropathy: Secondary | ICD-10-CM | POA: Diagnosis not present

## 2021-11-02 DIAGNOSIS — F3130 Bipolar disorder, current episode depressed, mild or moderate severity, unspecified: Secondary | ICD-10-CM

## 2021-11-02 DIAGNOSIS — R269 Unspecified abnormalities of gait and mobility: Secondary | ICD-10-CM | POA: Diagnosis not present

## 2021-11-02 NOTE — Patient Instructions (Addendum)
Hepatitis B with cirrhosis.  Upcoming appointment with hepatitis specialist.  Recent failed imaging studies/MRI due to possible claustrophobia/anxiety.  Formally was on clonazepam.  Recommend that you reach out to your psychiatrist to see if they would write you 1 tablet prescription of clonazepam prior to attempted repeat MRI.  Also discussed this with hepatitis specialist.  Bipolar with recent mild depression on discussion.  Reviewed reviewed recent psychiatrist office visit.  Continue current medication plan.  Diabetes with recent blood sugars 120 in the morning.  Last A1c was 7.1.  Continue Glucotrol XL.  Attend upcoming appointment with endocrinologist.  Recurrence of mild chronic diarrhea.  Recommend continuing Imodium 1-2 times daily.  When you see GI MD for cirrhosis get his opinion on other potential medication.  I would recommend Imodium but if diarrhea worsens GI MD might prescribe Lomotil.  Recent fall about 1 month ago and unstable gait with describes slow gait for 6 months.  Placed referral to physical therapy.  Recommend that you have an office visit early October or sooner if needed.  Since you do live in [place] ask you  to discuss with [name] whether or not I would be your primary care in the future.  If so would like at least 1 in person visit a year.  But be aware that virtual visits are limited and may require additional in person visits due to potential severity of complaints.  So you might need to establish care and [place] or have easy access urgent care and immediate local care needed.

## 2021-11-02 NOTE — Progress Notes (Signed)
Subjective:    Patient ID: Angel [name], male    DOB: 10/23/1955, 66 y.o.   MRN: [id number]  HPI   Virtual Visit via Video Note  I connected with [name] on 11/02/21 at  3:00 PM EDT by a video enabled telemedicine application and verified that I am speaking with the correct person using two identifiers.  Location: Patient: home Provider: office   I discussed the limitations of evaluation and management by telemedicine and the availability of in person appointments. The patient expressed understanding and agreed to proceed.  History of Present Illness:   Hx of chronic hep b with cirrhosis. Failed mri of liver since could not tolerate being in machine. He thinks being off clonazepam was reason.     Recent saw psychiatrist.  Bipolar I Disorder: --Continue Lithium ER 450 mg QAM, 900 mg at bedtime. Recent level of 0.8. --Continue Vraylar 3 mg nightly  --Continue Doxepin 3 mg likely plan for continued taper --START hydroxyzine 25 mg BID PRN. Discussed he can start at 12.5 mg and go up if needed. --consider restarting effexor - would like to get collateral from previous provider Venia Carbon) before if possible.  --Discussed behavioral activation today, Lyncoln planning to try and get more plugged in with his local community (maybe a church).   Pt states recently off balance when walks. Pt had one fall. He fell waking down hill. Pt states slow gait. Case for 6 months. Fall one month ago and does not describe injury.   Diabetes- last a1c 4 months 05-2021 was 7.1. on gluctotrol xl 5 mg daily. Will see endocrine 11-29-2021.    Observations/Objective:Gayla Medicus acute distress, pleasant, oriented. Lungs- on inspection lungs appear unlabored. Neck- no tracheal deviation or jvd on inspection. Neuro- gross motor function appears intact.   Assessment and Plan: Patient Instructions  Hepatitis B with cirrhosis.  Upcoming appointment with hepatitis specialist.  Recent failed  imaging studies/MRI due to possible claustrophobia/anxiety.  Formally was on clonazepam.  Recommend that you reach out to your psychiatrist to see if they would write you 1 tablet prescription of clonazepam prior to attempted repeat MRI.  Also discussed this with hepatitis specialist.  Bipolar with recent mild depression on discussion.  Reviewed reviewed recent psychiatrist office visit.  Continue current medication plan.  Diabetes with recent blood sugars 120 in the morning.  Last A1c was 7.1.  Continue Glucotrol XL.  Attend upcoming appointment with endocrinologist.  Recurrence of mild chronic diarrhea.  Recommend continuing Imodium 1-2 times daily.  When you see GI MD for cirrhosis get his opinion on other potential medication.  I would recommend Imodium but if diarrhea worsens GI MD might prescribe Lomotil.  Recent fall about 1 month ago and unstable gait with describes slow gait for 6 months.  Placed referral to physical therapy.  Recommend that you have an office visit early October or sooner if needed.  Since you do live in [place] ask you  to discuss with [name] whether or not I would be your primary care in the future.  If so would like at least 1 in person visit a year.  But be aware that virtual visits are limited and may require additional in person visits due to potential severity of complaints.  So you might need to establish care and [place] or have easy access urgent care and immediate local care needed.   [name], PA-C   Follow Up Instructions:    I discussed the assessment and treatment plan with  the patient. The patient was provided an opportunity to ask questions and all were answered. The patient agreed with the plan and demonstrated an understanding of the instructions.   The patient was advised to call back or seek an in-person evaluation if the symptoms worsen or if the condition fails to improve as anticipated.  [name], PA-C      Review of  Systems     Objective:   Physical Exam        Assessment & Plan:

## 2021-11-03 ENCOUNTER — Encounter: Payer: Self-pay | Admitting: Medical

## 2021-11-03 DIAGNOSIS — K7460 Unspecified cirrhosis of liver: Secondary | ICD-10-CM | POA: Diagnosis not present

## 2021-11-03 DIAGNOSIS — K7290 Hepatic failure, unspecified without coma: Secondary | ICD-10-CM | POA: Diagnosis not present

## 2021-11-03 DIAGNOSIS — K8681 Exocrine pancreatic insufficiency: Secondary | ICD-10-CM | POA: Diagnosis not present

## 2021-11-03 DIAGNOSIS — K529 Noninfective gastroenteritis and colitis, unspecified: Secondary | ICD-10-CM | POA: Diagnosis not present

## 2021-11-09 ENCOUNTER — Ambulatory Visit: Payer: Medicare HMO | Admitting: "Endocrinology

## 2021-11-09 ENCOUNTER — Telehealth
Admit: 2021-11-09 | Discharge: 2021-11-10 | Payer: MEDICARE | Attending: Student in an Organized Health Care Education/Training Program | Primary: Student in an Organized Health Care Education/Training Program

## 2021-11-09 DIAGNOSIS — F319 Bipolar disorder, unspecified: Secondary | ICD-10-CM | POA: Diagnosis not present

## 2021-11-09 MED ORDER — VENLAFAXINE ER 150 MG CAPSULE,EXTENDED RELEASE 24 HR
ORAL_CAPSULE | Freq: Every day | ORAL | 0 refills | 30 days | Status: CP
Start: 2021-11-09 — End: 2021-12-09

## 2021-11-09 NOTE — Unmapped (Signed)
[facility]  Psychiatry   Established Patient E&M Service - Outpatient       Assessment:    Nicholas [name] presents for follow-up evaluation. Today, Nicholas [name] is stable. Maybe having some mild improvement in mood, but notes this may be more related to spending more time w/ his partner and getting out of the house more. Tolerating meds and denies any issues w/ compliance. He has requested past psych records. I also spoke w/ RN at Dr. 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 I discussed this today w/ Rayna Sexton. 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 He does not think he is currently having TD though I have noticed some mild movements in his mouth (could be related to dental issues). We did discuss that TD would be a risk w/ switching back to risperidone though he is currently on an antipsychotic already so presumably he is already being exposed to some (if not equivalent risk of TD). We also discussed that recommendation would be for some dose adjustment based on his liver dz. We reviewed other options for treatment of bipolar disorder/bipolar depression including switching antidepressant medications. He also currently on the waitlist for resident CBT which could be very helpful for his depression. He would like to discuss options w/ his husband and get back to me about making a medication change. We did discuss possibility of discontinuing doxepin given good tolerability of taper thus far and recent oversleeping. He was amenable to trying this. Will plan for follow-up in ~2 weeks.    Identifying Information:  Nicholas [name] is a 66 y.o. male with a history of  Bipolar I Disorder that was diagnosed in 2003. He established with this clinic in September 2022. At that time, he was prescribed vraylar, lithium, doxepin, and xanax with fairly good efficacy and tolerability.     Risk Assessment:  A full psychiatric risk assessment was conducted on 01/31/21 and risks do not appear significantly changed from that visit.   While future psychiatric events cannot be accurately predicted, the patient does not currently require acute inpatient psychiatric care and does not currently meet [facility] involuntary commitment criteria.      Plan:    #Bipolar I Disorder:  --Continue Lithium ER 450 mg QAM, 900 mg at bedtime. Recent level of 0.8.  --Continue Vraylar 3 mg nightly   --May consider titration of Vraylar vs. Transition to another antipsychotic medication (risperidone may be good option given report of past good response to this medication).  --DISCONTINUE Doxepin - discussed that he could try 1.5 mg nightly if unable to tolerate going off entirely.  --Continue hydroxyzine 25 mg BID PRN.   --Continue effexor 150 mg (s 4/11,i5/12, I5/22). Likely will want to taper given lack of efficacy. May consider trial of another antidepressant (when discussing previously seems only other true antidepressant was wellbutrin)  --Discussed behavioral activation today, Have previously discussed getting more plugged in with local community (maybe a church).  --Recommend discussing PT with PCP to help w/ his unsteadiness on feet/hoping this could increase activity level.  --Consider referral for therapy - on waitlist for resident CBT (referral sent June 2023)     #Medication monitoring:   --Ordered BMP  --Recent Cr of 0.8 per careeverywhere records  Lab Results   Component Value Date    Cholesterol 136 06/06/2021    LDL Calculated 67  06/06/2021    HDL 33 (L) 06/06/2021    Triglycerides 181 (H) 06/06/2021     Hemoglobin A1C:   Lab Results   Component Value Date    Hemoglobin A1C 6.8 01/12/2021      Fasting Blood Sugar:   Lab Results   Component Value Date    Glucose 93 10/30/2019     Lithium Lvl   Date Value Ref Range Status   06/06/2021 0.8 0.5 - 1.2 mmol/L Final     TSH   Date Value Ref Range Status   06/06/2021 3.535 0.550 - 4.780 uIU/mL Final       Psychotherapy provided:  No billable psychotherapy service provided but brief supportive therapy was utilized.    Patient has been given this writer's contact information as well as the [facility] Psychiatry urgent line number. The patient has been instructed to call 911 for emergencies.    Subjective:    Interval History:  Derius was on-time and alone for our visit today. Pt states he is doing Ok. Reports he did call [facility], but hasn't heard back yet. He does not think he has any abnormal movements in his face/mouth now. Notes that this has been a problem for him in the past.    States that his mood has been about the same to a little better. States that he has spent time w/ [name] and gotten out of the house more. States this tends to make him feel better. He currently doesn't have a car b/c [name] is using his car. States that he cut back his melatonin to 1 pill rather than 2 and that has not had any adverse effects with that.     Denies any SI. No new issues since our last visit. Denies any medication compliance issues.     Discussed potential to come off doxepin.     Thinks [name] would like him to take risperidone again. He notes that he stopped the [place] d/t issues w/ actually finding someone to give him the shot.     Discussed POC (see assessment above for details). No other q's or concerns today.    Collateral: Did talk w/ Marcelino Duster (RN) at Dr. Caprice Renshaw office. Spent ~15 minutes on the phone. She reports that he was taking lithium, effexor, depakote and klonopin the longest. There was some sort of therapeutic rupture with Dr. Donell Beers. As far back as 2016 there is no mention of risperidone in the chart. Hx of being on tegretol, came off d/t cirrhosis. There is documentation that patient is off neuroleptics d/t TD. He was off risperidone consta and was on invega (per documentation from 2016). Based on the documentation from Dr. Donell Beers risperidone was stopped d/t risk of TD and development of some mild TD.     Objective:    Mental Status Exam:  Appearance:    Appears stated age, Well nourished, Well developed and Clean/Neat   Motor:   No abnormal movements   Speech/Language:    Normal rate, volume, tone, fluency and Language intact, well formed   Mood:   The same   Affect:   Constricted   Thought process and Associations:   Logical, linear, clear, coherent, goal directed   Abnormal/psychotic thought content:    Mood is low, low motivation, low energy. Not reporting significant anxiety. Denies SI. Remains future oriented. Not voicing any bizarre or delusional thought content on interview.   Perceptual disturbances:      He is not endorsing any AVH. No RIS  on interview.     Other:        PHQ-9 PHQ-9 TOTAL SCORE   11/08/2021   6:18 PM 14   09/15/2021   8:40 PM 17         Visit was completed by video (or phone) and the appropriate disclaimer has been included below.    I personally spent 42 minutes face-to-face and non-face-to-face in the care of this patient, which includes all pre, intra, and post visit time on the date of service.  All documented time was specific to the E/M visit and does not include any procedures that may have been performed.      The patient reports they are currently: at home. I spent 22 minutes on the real-time audio and video with the patient on the date of service. I spent an additional 20 minutes on pre- and post-visit activities on the date of service.     The patient was physically located in [place] or a state in which I am permitted to provide care. The patient and/or parent/guardian understood that s/he may incur co-pays and cost sharing, and agreed to the telemedicine visit. The visit was reasonable and appropriate under the circumstances given the patient's presentation at the time.    The patient and/or parent/guardian has been advised of the potential risks and limitations of this mode of treatment (including, but not limited to, the absence of in-person examination) and has agreed to be treated using telemedicine. The patient's/patient's family's questions regarding telemedicine have been answered.     If the visit was completed in an ambulatory setting, the patient and/or parent/guardian has also been advised to contact their provider???s office for worsening conditions, and seek emergency medical treatment and/or call 911 if the patient deems either necessary.          Dennison Bulla, MD

## 2021-11-09 NOTE — Unmapped (Addendum)
Follow-up instructions:  --Discontinue doxepin nightly. If you have difficulty sleeping when stopping the medication, you could restart with a half tablet (1.5 mg nightly) to see if that  can work for you.  -- Please continue taking your medications as prescribed for your mental health.   -- Do not make changes to your medications, including taking more or less than prescribed, unless under the supervision of your physician. Be aware that some medications may make you feel worse if abruptly stopped  -- Please refrain from using illicit substances, as these can affect your mood and could cause anxiety or other concerning symptoms.   -- Seek further medical care for any increase in symptoms or new symptoms such as thoughts of wanting to hurt yourself or hurt others.     Contact info:  Life-threatening emergencies: call 911 or go to the nearest ER for medical or psychiatric attention.     Issues that need urgent attention but are not life threatening: call the clinic outpatient frontdesk at [phone] for assistance.     Non-urgent routine concerns, questions, and refill requests: please send me a mychart message and I will get back to you within 2 business days. If you prefer to call, please call the front desk at [phone].    Regarding appointments:  - If you need to cancel your appointment, we ask that you call [phone] at least 24 hours before your scheduled appointment.  - If for any reason you arrive 15 minutes later than your scheduled appointment time, you may not be seen and your visit may be rescheduled.  - Please remember that we will not automatically reschedule missed appointments.  - If you miss two (2) appointments without letting us know in advance, you will likely be referred to a provider in your community.  - We will do our best to be on time. Sometimes an emergency will arise that might cause your clinician to be late. We will try to inform you of this when you check in for your appointment. If you wait more than 15 minutes past your appointment time without such notice, please speak with the front desk staff.    In the event of bad weather, the clinic staff will attempt to contact you, should your appointment need to be rescheduled. Additionally, you can call the Patient Weather Line [phone] for system-wide clinic status    For more information and reminders regarding clinic policies (these were provided when you were admitted to the clinic), please ask the front desk.

## 2021-11-15 ENCOUNTER — Other Ambulatory Visit: Payer: Self-pay | Admitting: Nurse Practitioner

## 2021-11-23 ENCOUNTER — Telehealth
Admit: 2021-11-23 | Discharge: 2021-11-24 | Payer: MEDICARE | Attending: Student in an Organized Health Care Education/Training Program | Primary: Student in an Organized Health Care Education/Training Program

## 2021-11-23 DIAGNOSIS — F3130 Bipolar disorder, current episode depressed, mild or moderate severity, unspecified: Principal | ICD-10-CM

## 2021-11-23 DIAGNOSIS — Z79899 Other long term (current) drug therapy: Principal | ICD-10-CM

## 2021-11-23 MED ORDER — RISPERIDONE 1 MG TABLET
ORAL_TABLET | Freq: Every evening | ORAL | 0 refills | 30 days | Status: CP
Start: 2021-11-23 — End: 2021-12-23

## 2021-11-23 NOTE — Unmapped (Signed)
Follow-up instructions:  --START risperidone 1 mg nightly  --Continue Vraylar 3 mg nightly for 1 week and then stop  -- Please continue taking your medications as prescribed for your mental health.   -- Do not make changes to your medications, including taking more or less than prescribed, unless under the supervision of your physician. Be aware that some medications may make you feel worse if abruptly stopped  -- Please refrain from using illicit substances, as these can affect your mood and could cause anxiety or other concerning symptoms.   -- Seek further medical care for any increase in symptoms or new symptoms such as thoughts of wanting to hurt yourself or hurt others.     Contact info:  Life-threatening emergencies: call 911 or go to the nearest ER for medical or psychiatric attention.     Issues that need urgent attention but are not life threatening: call the clinic outpatient frontdesk at [phone] for assistance.     Non-urgent routine concerns, questions, and refill requests: please send me a mychart message and I will get back to you within 2 business days. If you prefer to call, please call the front desk at [phone].    Regarding appointments:  - If you need to cancel your appointment, we ask that you call [phone] at least 24 hours before your scheduled appointment.  - If for any reason you arrive 15 minutes later than your scheduled appointment time, you may not be seen and your visit may be rescheduled.  - Please remember that we will not automatically reschedule missed appointments.  - If you miss two (2) appointments without letting us know in advance, you will likely be referred to a provider in your community.  - We will do our best to be on time. Sometimes an emergency will arise that might cause your clinician to be late. We will try to inform you of this when you check in for your appointment. If you wait more than 15 minutes past your appointment time without such notice, please speak with the front desk staff.    In the event of bad weather, the clinic staff will attempt to contact you, should your appointment need to be rescheduled. Additionally, you can call the Patient Weather Line [phone] for system-wide clinic status    For more information and reminders regarding clinic policies (these were provided when you were admitted to the clinic), please ask the front desk.

## 2021-11-23 NOTE — Unmapped (Signed)
[facility]  Psychiatry   Established Patient E&M Service - Outpatient       Assessment:    Bennette Manigault presents for follow-up evaluation. Today, Darmon reports he is fairly stable. Still having significant depressive sx though notes he has had some good days since we last met. He is now off doxepin and melatonin with no negative impact on sleep. He discussed changing to risperidone with his husband and wants to move forward w/ that plan. We reviewed risks/benefits today. 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 He was amenable to changing from vraylar to risperidone. We did discuss need for dose adjustment given his liver disease. We will plan for follow-up in ~3 weeks.    Identifying Information:  Tayler Lebrecht is a 66 y.o. male with a history of  Bipolar I Disorder that was diagnosed in 2003. He established with this clinic in September 2022. At that time, he was prescribed vraylar, lithium, doxepin, and xanax with fairly good efficacy and tolerability.     Risk Assessment:  A full psychiatric risk assessment was conducted on 01/31/21 and risks do not appear significantly changed from that visit.   While future psychiatric events cannot be accurately predicted, the patient does not currently require acute inpatient psychiatric care and does not currently meet [facility] involuntary commitment criteria.      Plan:    #Bipolar I Disorder:  --Continue Lithium ER 450 mg QAM, 900 mg at bedtime. Recent level of 0.8.  --Continue Vraylar 3 mg nightly. Will continue for 1 add'l week and then STOP.  --START risperidone 1 mg nightly (will plan to increase slowly, target lower dose given his liver dz).  --Continue hydroxyzine 25 mg BID PRN.   --Continue effexor 150 mg (s 4/11,i5/12, I5/22). Likely will want to taper given lack of efficacy. May consider trial of another antidepressant (when discussing previously seems only other true antidepressant was wellbutrin)  --Discussed behavioral activation today, Have previously discussed getting more plugged in with local community (maybe a church).  --Recommend discussing PT with PCP to help w/ his unsteadiness on feet/hoping this could increase activity level.  --Consider referral for therapy - on waitlist for resident CBT (referral sent June 2023)     #Medication monitoring:   --Ordered BMP  --Recent Cr of 0.8 per careeverywhere records  Lab Results   Component Value Date    Cholesterol 136 06/06/2021    LDL Calculated 67 06/06/2021    HDL 33 (L) 06/06/2021    Triglycerides 181 (H) 06/06/2021     Hemoglobin A1C:   Lab Results   Component Value Date    Hemoglobin A1C 6.8 01/12/2021     Lithium Lvl   Date Value Ref Range Status   06/06/2021 0.8 0.5 - 1.2 mmol/L Final     TSH   Date Value Ref Range Status   06/06/2021 3.535 0.550 - 4.780 uIU/mL Final       Psychotherapy provided:  No billable psychotherapy service provided but brief supportive therapy was utilized.    Patient has been given this writer's contact information as well as the [facility] urgent line number. The patient has been instructed to call 911 for emergencies.    Subjective:    Interval History:   Pt states that he is pretty good I guess. States he discussed risperdal w/ his husband and he felt this was a good idea. States that he is about the same. States that he stopped venlafaxine  for 1 day and had a bad day. States I've been having good days and bad days. States that he is off doxepin. That went fine. States his sleep isn't any worse w/o the doxepin. He is now off melatonin as well w/o a big change in his sleep. Denies any SI recently. States that his husband has a few days off and they spent time w/ friends as well. This was all helpful for his mood.     Denies any other major issues today.      We discussed trial of risperidone. Discussed that we should get AIMS soon to have a baseline of any abnormal movements and to help w/ monitoring as we make this medication change. He does have dentures.    Discussed POC (see assessment above for details). No other q's or concerns today.    Objective:    Mental Status Exam:  Appearance:    Appears stated age, Well nourished, Well developed and Clean/Neat   Motor:   No abnormal movements   Speech/Language:    Normal rate, volume, tone, fluency and Language intact, well formed   Mood:   The same   Affect:   Constricted   Thought process and Associations:   Logical, linear, clear, coherent, goal directed   Abnormal/psychotic thought content:    Ongoing low mood, low motivation. Is able to enjoy things at times Denies SI. Remains future oriented. Not voicing any bizarre or delusional thought content on interview.   Perceptual disturbances:      He is not endorsing any AVH. No RIS on interview.     Other:        PHQ-9 PHQ-9 TOTAL SCORE   11/08/2021   6:18 PM 14   09/15/2021   8:40 PM 17         Visit was completed by video (or phone) and the appropriate disclaimer has been included below.    The patient reports they are currently: at home. I spent 15 minutes on the real-time audio and video with the patient on the date of service. I spent an additional 10 minutes on pre- and post-visit activities on the date of service.     The patient was physically located in [place] or a state in which I am permitted to provide care. The patient and/or parent/guardian understood that s/he may incur co-pays and cost sharing, and agreed to the telemedicine visit. The visit was reasonable and appropriate under the circumstances given the patient's presentation at the time.    The patient and/or parent/guardian has been advised of the potential risks and limitations of this mode of treatment (including, but not limited to, the absence of in-person examination) and has agreed to be treated using telemedicine. The patient's/patient's family's questions regarding telemedicine have been answered.     If the visit was completed in an ambulatory setting, the patient and/or parent/guardian has also been advised to contact their provider???s office for worsening conditions, and seek emergency medical treatment and/or call 911 if the patient deems either necessary.    Dennison Bulla, MD

## 2021-11-29 ENCOUNTER — Ambulatory Visit: Payer: Medicare HMO | Admitting: "Endocrinology

## 2021-11-29 ENCOUNTER — Encounter: Payer: Self-pay | Admitting: "Endocrinology

## 2021-11-29 VITALS — BP 108/62 | HR 64 | Ht 70.0 in | Wt 270.8 lb

## 2021-11-29 DIAGNOSIS — E559 Vitamin D deficiency, unspecified: Secondary | ICD-10-CM

## 2021-11-29 DIAGNOSIS — E782 Mixed hyperlipidemia: Secondary | ICD-10-CM | POA: Diagnosis not present

## 2021-11-29 DIAGNOSIS — E1165 Type 2 diabetes mellitus with hyperglycemia: Secondary | ICD-10-CM

## 2021-11-29 LAB — HEMOGLOBIN A1C: Hemoglobin A1C: 6.6

## 2021-11-29 NOTE — Patient Instructions (Signed)

## 2021-11-29 NOTE — Progress Notes (Signed)
Endocrinology Follow Up Note       11/30/2021, 8:17 AM   Subjective:    Patient ID: Angel [name], male    DOB: 1955/09/27.  Angel [name] is being seen in follow up after being seen in consultation for management of currently uncontrolled symptomatic diabetes requested by  [name], PA-C.   Past Medical History:  Diagnosis Date   Bipolar disorder ([place])    Cirrhosis of liver without mention of alcohol    Depression    GERD (gastroesophageal reflux disease)    Headache    Hepatitis B    Hepatitis E. 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 has lost 10 lbs since pre-visit, intentionally with diet and walking    Past Surgical History:  Procedure Laterality Date   APPENDECTOMY     COLONOSCOPY     POLYPECTOMY     UPPER GASTROINTESTINAL ENDOSCOPY      Social History   Socioeconomic History   Marital status: Single    Spouse name: [name]   Number of children: 0   Years of education: Not on file   Highest education level: Master's degree (e.g., MA, MS, MEng, MEd, MSW, MBA)  Occupational History   Occupation: retired    Fish farm manager: NOT EMPLOYED  Tobacco Use   Smoking status: Former    Types: Cigarettes    Quit date: 05/08/2009    Years since quitting: 12.5   Smokeless tobacco: Never  Vaping Use   Vaping Use: Never used  Substance and Sexual Activity   Alcohol use: No    Alcohol/week: 0.0 standard drinks of alcohol   Drug use: No   Sexual activity: Not Currently  Other Topics Concern   Not on file  Social History Narrative   Patient is right-handed. He is married to same sex partner. He gets little exercise.   Caffeine: 4 glasses of tea max/day   Social Determinants of Health   Financial Resource Strain: Not on file   Food Insecurity: Not on file  Transportation Needs: Not on file  Physical Activity: Not on file  Stress: Not on file  Social Connections: Not on file    Family History  Problem Relation Age of Onset   Heart disease Mother    Diabetes Father    Kidney failure Father    Kidney disease Father    Other Sister        GERD   Colon cancer Neg Hx    Rectal cancer Neg Hx    Stomach cancer Neg Hx    Esophageal cancer Neg Hx    Headache Neg Hx    Migraines Neg Hx     Outpatient Encounter Medications as of 11/29/2021  Medication Sig   Blood Glucose Monitoring Suppl (ACCU-CHEK AVIVA PLUS) w/Device KIT Check sugar TID . Dx code:R73.9   diphenoxylate-atropine (LOMOTIL) 2.5-0.025 MG tablet Take 1 tablet by mouth 4 (four) times daily as needed for diarrhea or loose stools.   glipiZIDE (GLUCOTROL XL)  5 MG 24 hr tablet TAKE 1 TABLET(5 MG) BY MOUTH DAILY WITH BREAKFAST   glucose blood (ONETOUCH VERIO) test strip TEST TWICE A DAY BEFORE BREAKFAST AND BEFORE BEDTIME   Lancet Devices (LANCING DEVICE) MISC 1 Device by Does not apply route as directed. Compatible with onetouch ultrasoft lancets   lithium carbonate (ESKALITH) 450 MG CR tablet Take 1,400 mg by mouth daily.   Melatonin 10 MG SUBL Place 10 mg under the tongue at bedtime.   nadolol (CORGARD) 20 MG tablet Take 20 mg by mouth daily.   OneTouch Delica Lancets 38G MISC Use as instructed to monitor glucose twice daily   Pancrelipase, Lip-Prot-Amyl, (CREON PO) Take by mouth. 21000 units with meals and snacks   Tenofovir Alafenamide Fumarate (VEMLIDY) 25 MG TABS Take by mouth.   [DISCONTINUED] BISACODYL 5 MG EC tablet Take 5 mg by mouth daily as needed. (Patient not taking: Reported on 06/07/2021)   [DISCONTINUED] Doxepin HCl 6 MG TABS Take 1 tablet by mouth at bedtime.   [DISCONTINUED] methocarbamol (ROBAXIN) 750 MG tablet Take 1 tablet (750 mg total) by mouth every 8 (eight) hours as needed for muscle spasms.   [DISCONTINUED] Pancrelipase,  Lip-Prot-Amyl, (PANCREAZE) 37000-97300 units CPEP    [DISCONTINUED] VRAYLAR 1.5 MG capsule Take 1.5 mg by mouth daily.   No facility-administered encounter medications on file as of 11/29/2021.    ALLERGIES: Allergies  Allergen Reactions   Aripiprazole Other (See Comments)    Tardive Dyskinesia   Aripiprazole     Other reaction(s): Other (See Comments) Tardive Dyskinesia   Invega [Paliperidone Er] Other (See Comments)    Tardive dyskinsia symptoms   Quetiapine Diarrhea, Hives, Itching, Nausea And Vomiting, Nausea Only, Shortness Of Breath and Rash   Eszopiclone Other (See Comments)    Sleep walking   Escitalopram     Other reaction(s): Other (See Comments) Doesn't Work   Hydrocodone Itching   Hydrocodone Bit-Homatrop Mbr Rash   Lamictal [Lamotrigine] Rash   Lexapro [Escitalopram Oxalate] Other (See Comments)    Doesn't Work    VACCINATION STATUS: Immunization History  Administered Date(s) Administered   Influenza Split 04/11/2011   Influenza,inj,Quad PF,6+ Mos 04/09/2014, 01/19/2015, 03/27/2016, 02/27/2017, 03/14/2018, 01/02/2019, 02/26/2020   Influenza,inj,quad, With Preservative 03/21/2013   Influenza,trivalent, recombinat, inj, PF 04/11/2011   Pneumococcal Polysaccharide-23 03/21/2013   Tdap 10/07/2013   Zoster Recombinat (Shingrix) 01/02/2019, 03/04/2019   Zoster, Live 08/12/2015    Diabetes He presents for his follow-up diabetic visit. He has type 2 diabetes mellitus. The initial diagnosis of diabetes was made 2 months (Diagnosed approx 2 months ago) ago. His disease course has been improving. There are no hypoglycemic associated symptoms. Associated symptoms include foot paresthesias. Pertinent negatives for diabetes include no blurred vision, no fatigue, no foot ulcerations, no polydipsia, no polyuria and no weight loss. There are no hypoglycemic complications. Symptoms are improving. Diabetic complications include nephropathy and peripheral neuropathy. Risk factors  for coronary artery disease include diabetes mellitus, dyslipidemia, family history, male sex, obesity and sedentary lifestyle. Current diabetic treatment includes diet and oral agent (monotherapy). He is compliant with treatment most of the time. His weight is decreasing steadily. He is following a generally unhealthy diet. When asked about meal planning, he reported none. He participates in exercise intermittently. His home blood glucose trend is decreasing steadily. His breakfast blood glucose range is generally 130-140 mg/dl. His bedtime blood glucose range is generally 130-140 mg/dl. His overall blood glucose range is 130-140 mg/dl. (He presents today accompanied by his partner with  continued improvement in his glycemic profile.  His point-of-care A1c is 6.6%, improving from 7.1% during his last visit.  He denies any hypoglycemia.  His meter average is 125-145 mg per DL.) An ACE inhibitor/angiotensin II receptor blocker is not being taken. He does not see a podiatrist.Eye exam is current.  Hyperlipidemia This is a chronic problem. The current episode started more than 1 year ago. The problem is uncontrolled. Recent lipid tests were reviewed and are variable. Exacerbating diseases include chronic renal disease, diabetes, liver disease and obesity. Factors aggravating his hyperlipidemia include beta blockers and fatty foods. He is currently on no antihyperlipidemic treatment. Compliance problems include adherence to exercise.  Risk factors for coronary artery disease include diabetes mellitus, dyslipidemia, family history, obesity, male sex and a sedentary lifestyle.     Review of systems  Constitutional: + slow increase in body weight,  current Body mass index is 38.86 kg/m. , no fatigue, no subjective hyperthermia, no subjective hypothermia Eyes: no blurry vision, no xerophthalmia ENT: no sore throat, no nodules palpated in throat, no dysphagia/odynophagia, no hoarseness Cardiovascular: no chest  pain, no shortness of breath, no palpitations Respiratory: no cough, no shortness of breath Gastrointestinal: no nausea/vomiting/, + persistent diarrhea (on creon for pancreatic exocrine insufficiency) Musculoskeletal: no muscle/joint aches Skin: no rashes, no hyperemia Neurological: no tremors, + numbness/tingling to BLE, no dizziness Psychiatric: no depression, no anxiety  Objective:     BP 108/62   Pulse 64   Ht $R'5\' 10"'bB$  (1.778 m)   Wt 270 lb 12.8 oz (122.8 kg)   BMI 38.86 kg/m   Wt Readings from Last 3 Encounters:  11/29/21 270 lb 12.8 oz (122.8 kg)  11/02/21 275 lb (124.7 kg)  06/07/21 279 lb (126.6 kg)     BP Readings from Last 3 Encounters:  11/29/21 108/62  06/07/21 101/62  01/12/21 97/61      Physical Exam- Limited  Constitutional:  Body mass index is 38.86 kg/m. , not in acute distress, normal state of mind Eyes:  EOMI, no exophthalmos Neck: Supple     CMP ( most recent) CMP     Component Value Date/Time   NA 141 05/16/2021 1336   K 4.2 05/16/2021 1336   CL 105 05/16/2021 1336   CO2 23 05/16/2021 1336   GLUCOSE 188 (H) 05/16/2021 1336   GLUCOSE 119 (H) 10/06/2020 1552   BUN 15 05/16/2021 1336   CREATININE 0.83 05/16/2021 1336   CREATININE 1.00 03/10/2020 1011   CALCIUM 9.1 05/16/2021 1336   PROT 6.6 05/16/2021 1336   ALBUMIN 4.2 05/16/2021 1336   AST 28 05/16/2021 1336   ALT 42 05/16/2021 1336   ALKPHOS 72 05/16/2021 1336   BILITOT 0.5 05/16/2021 1336   GFRNONAA 94 05/06/2019 1324   GFRNONAA 92 02/09/2017 1527   GFRAA 109 05/06/2019 1324   GFRAA 106 02/09/2017 1527     Diabetic Labs (most recent): Lab Results  Component Value Date   HGBA1C 6.6 11/29/2021   HGBA1C 7.1 (A) 06/07/2021   HGBA1C 6.8 (A) 01/12/2021   MICROALBUR 10 01/12/2021   MICROALBUR 0.4 02/10/2020     Lipid Panel ( most recent) Lipid Panel     Component Value Date/Time   CHOL 164 05/16/2021 1336   TRIG 334 (H) 05/16/2021 1336   HDL 27 (L) 05/16/2021 1336    CHOLHDL 6.1 (H) 05/16/2021 1336   CHOLHDL 5.4 (H) 02/10/2020 1054   VLDL 32.8 09/28/2016 1659   LDLCALC 82 05/16/2021 1336   LDLCALC 93 02/10/2020  1054   LABVLDL 55 (H) 05/16/2021 1336      Lab Results  Component Value Date   TSH 3.360 05/16/2021   TSH 0.72 11/28/2019   TSH 3.160 05/06/2019   TSH 2.43 10/11/2017   TSH 3.955 04/22/2015   TSH 2.87 10/07/2013   FREET4 0.91 05/16/2021           Assessment & Plan:   1) Uncontrolled Type 2 Diabetes with hyperglycemia He presents today accompanied by his partner with continued improvement in his glycemic profile.  His point-of-care A1c is 6.6%, improving from 7.1% during his last visit.  He denies any hypoglycemia.  His meter average is 125-145 mg per DL.  - Angel [name] has currently uncontrolled symptomatic type 2 DM since 66 years of age.  -Recent labs reviewed.  - I had a long discussion with him about the progressive nature of diabetes and the pathology behind its complications. -his diabetes is complicated by peripheral neuropathy and mild CKD and he remains at a high risk for more acute and chronic complications which include CAD, CVA, and retinopathy. These are all discussed in detail with him.  - he acknowledges that there is a room for improvement in his food and drink choices. - Suggestion is made for him to avoid simple carbohydrates  from his diet including Cakes, Sweet Desserts, Ice Cream, Soda (diet and regular), Sweet Tea, Candies, Chips, Cookies, Store Bought Juices, Alcohol , Artificial Sweeteners,  Coffee Creamer, and "Sugar-free" Products, Lemonade. This will help patient to have more stable blood glucose profile and potentially avoid unintended weight gain.  The following Lifestyle Medicine recommendations according to [place]  [facility]) were discussed and and offered to patient and he  agrees to start the journey:  A. Whole Foods, Plant-Based Nutrition comprising of fruits and  vegetables, plant-based proteins, whole-grain carbohydrates was discussed in detail with the patient.   A list for source of those nutrients were also provided to the patient.  Patient will use only water or unsweetened tea for hydration. B.  The need to stay away from risky substances including alcohol, smoking; obtaining 7 to 9 hours of restorative sleep, at least 150 minutes of moderate intensity exercise weekly, the importance of healthy social connections,  and stress management techniques were discussed. C.  A full color page of  Calorie density of various food groups per pound showing examples of each food groups was provided to the patient.    - he has been scheduled with Jearld Fenton, RDE for diabetes education.  - I have approached him with the following individualized plan to manage  his diabetes and patient agrees:   -Based on his stable glycemic profile, he will not need additional medication intervention.  He is advised to continue glipizide 5 mg XL p.o. daily at breakfast.   -He is encouraged to continue monitoring blood glucose twice daily, before breakfast and before bed, and to call the clinic if he has readings less than 70 or greater than 300 for 3 tests in a row.  - Specific targets for  A1c;  LDL, HDL,  and Triglycerides were discussed with the patient.  2) Blood Pressure /Hypertension: -His blood pressure is controlled to target.  he is advised to continue his current medications including Nadolol 20 mg p.o. daily with breakfast.  3) Lipids/Hyperlipidemia:    Review of his recent lipid panel from 06/06/21 showed controlled LDL at 67 and elevated triglycerides of 181-drastically improved.  he is not  currently on any lipid lowering medications.  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    4)  Weight/Diet:  his Body mass index is 38.86 kg/m.  -  clearly complicating his diabetes care.   he is a candidate for weight loss. I discussed with him the  fact that loss of 5 - 10% of his  current body weight will have the most impact on his diabetes management.  Exercise, and detailed carbohydrates information provided  -  detailed on discharge instructions.  5) Chronic Care/Health Maintenance: -he not on ACEI/ARB or Statin medications and is encouraged to initiate and continue to follow up with Ophthalmology, Dentist, Podiatrist at least yearly or according to recommendations, and advised to stay away from smoking. I have recommended yearly flu vaccine and pneumonia vaccine at least every 5 years; moderate intensity exercise for up to 150 minutes weekly; and sleep for at least 7 hours a day.  - he is advised to maintain close follow up with [place], Percell Miller, PA-C for primary care needs, as well as his other providers for optimal and coordinated care.   I spent 41 minutes in the care of the patient today including review of labs from [place], Lipids, Thyroid Function, Hematology (current and previous including abstractions from other facilities); face-to-face time discussing  his blood glucose readings/logs, discussing hypoglycemia and hyperglycemia episodes and symptoms, medications doses, his options of short and long term treatment based on the latest standards of care / guidelines;  discussion about incorporating lifestyle medicine;  and documenting the encounter. Risk reduction counseling performed per USPSTF guidelines to reduce obesity and cardiovascular risk factors.     Please refer to Patient Instructions for Blood Glucose Monitoring and Insulin/Medications Dosing Guide"  in media tab for additional information. Please  also refer to " Patient Self Inventory" in the Media  tab for reviewed elements of pertinent patient history.  Angel [name] participated in the discussions, expressed understanding, and voiced agreement with the above plans.  All questions were answered to his satisfaction. he is encouraged to contact clinic should he have any  questions or concerns prior to his return visit.    Follow up plan: - Return in about 3 months (around 03/01/2022) for NV with Joy Haegele, F/U with Pre-visit Labs, Meter/CGM/Logs, A1c here.   Rayetta Pigg, [facility] [address] [place], [place] [address] Phone: [phone] Fax: [phone]  11/30/2021, 8:17 AM

## 2021-12-01 MED FILL — VEMLIDY 25 MG TABLET: ORAL | 30 days supply | Qty: 30 | Fill #4

## 2021-12-01 NOTE — Unmapped (Signed)
[facility] Clinical Assessment & Refill Coordination Note    Nicholas [name], DOB: 1955/05/30  Phone: [phone] (home) [phone] (work)    All above HIPAA information was verified with patient.     Was a Nurse, learning disability used for this call? No    Specialty Medication(s):   Infectious Disease: Vemlidy     Current Outpatient Medications   Medication Sig Dispense Refill    diphenoxylate-atropine (LOMOTIL) 2.5-0.025 mg per tablet Take 1 tablet by mouth four (4) times a day as needed for diarrhea.      doxepin 3 mg Tab Take 1 tablet by mouth nightly. 30 tablet 0    glipiZIDE (GLUCOTROL XL) 5 MG 24 hr tablet Take 5 mg by mouth in the morning.      hydrOXYzine (ATARAX) 25 MG tablet Take 1 tablet (25 mg total) by mouth two (2) times a day as needed for anxiety. 60 tablet 0    lipase-protease-amylase (PANCREAZE) 37,000-97,300- 149,900 unit CpDR 3 caps with each meal and 2 with snacks      lithium (ESKALITH CR) 450 MG ER tablet TAKE 1 TABLET(450MG ) BY MOUTH EVERY MORNING AND 2 TABLETS(900MG ) EVERY EVENING 90 tablet 2    melatonin 10 mg cap Take 10 mg by mouth nightly.      nadoloL (CORGARD) 20 MG tablet Take 1 tablet (20 mg total) by mouth daily. 90 tablet 1    risperiDONE (RISPERDAL) 1 MG tablet Take 1 tablet (1 mg total) by mouth at bedtime. 30 tablet 0    tenofovir alafenamide (VEMLIDY) 25 mg Tab tablet Take 1 tablet (25 mg total) by mouth daily. Take with food. 30 tablet 5    venlafaxine (EFFEXOR-XR) 150 MG 24 hr capsule Take 1 capsule (150 mg total) by mouth daily. 30 capsule 0     No current facility-administered medications for this visit.        Changes to medications: Nicholas [name] reports starting the following medications: Risperidone    Allergies   Allergen Reactions    Aripiprazole Other (See Comments)     Tardive Dyskinesia    Invega [Paliperidone] Other (See Comments)     Tardive dyskinsia symptoms    Escitalopram Other (See Comments)     Doesn't Work    Hydrocodone Itching Lamotrigine Rash    Quetiapine Rash       Changes to allergies: No    SPECIALTY MEDICATION ADHERENCE     Vemlidy 25 mg: 10 days of medicine on hand       Medication Adherence    Patient reported X missed doses in the last month: 0  Specialty Medication: Vemlidy 25mg   Patient is on additional specialty medications: No  Any gaps in refill history greater than 2 weeks in the last 3 months: no  Demonstrates understanding of importance of adherence: yes  Informant: patient  Provider-estimated medication adherence level: good  Patient is at risk for Non-Adherence: No          Specialty medication(s) dose(s) confirmed: Regimen is correct and unchanged.     Are there any concerns with adherence? No    Adherence counseling provided? Not needed    CLINICAL MANAGEMENT AND INTERVENTION      Clinical Benefit Assessment:    Do you feel the medicine is effective or helping your condition? Yes      HEPATITIS B AND ASSOCIATED LABS:     ** Overdue for an appointment and labs **    Lab Results   Component  Value Date/Time    HBVDNAQT Not Detected 04/23/2018 11:31 AM    HBVDNAQT Not Detected 08/22/2016 10:51 AM    HBVDNAQT Not Detected 01/03/2016 12:32 PM    HBEAG Positive (A) 04/23/2018 11:31 AM    HBEAG Positive (A) 08/22/2016 10:51 AM    HBEAG Negative 01/03/2016 12:32 PM    ALT 42 10/30/2019 11:15 AM    ALT 65 (H) 05/06/2019 01:33 PM    ALT 25 02/27/2019 04:04 PM    AST 35 10/30/2019 11:15 AM    CREATININE 1.0 01/14/2020 09:14 PM    CREATININE 0.88 10/30/2019 11:15 AM    CREATININE 0.9 10/01/2019 10:00 AM    CREATININE 0.73 (L) 05/06/2019 01:33 PM    CREATININE 1.0 04/15/2019 10:26 AM    CREATININE 1.02 02/27/2019 04:04 PM    CREATININE 0.9 02/28/2018 12:38 PM    CREATININE 0.9 06/05/2014 10:09 AM       Clinical Benefit counseling provided? Not needed    Adverse Effects Assessment:    Are you experiencing any side effects? No    Are you experiencing difficulty administering your medicine? No    Quality of Life Assessment:      How many days over the past month did your Hep B  keep you from your normal activities? For example, brushing your teeth or getting up in the morning. 0    Have you discussed this with your provider? Not needed    Acute Infection Status:    Acute infections noted within Epic:  No active infections  Patient reported infection: None    Therapy Appropriateness:    Is therapy appropriate and patient progressing towards therapeutic goals? Yes, therapy is appropriate and should be continued    DISEASE/MEDICATION-SPECIFIC INFORMATION      N/A    PATIENT SPECIFIC NEEDS     Does the patient have any physical, cognitive, or cultural barriers? No    Is the patient high risk? No    Does the patient require a Care Management Plan? No         SHIPPING     Specialty Medication(s) to be Shipped:   Infectious Disease: Vemlidy    Other medication(s) to be shipped: No additional medications requested for fill at this time     Changes to insurance: No    Delivery Scheduled: Yes, Expected medication delivery date: 12/02/21.     Medication will be delivered via UPS to the confirmed prescription address in [facility].    The patient will receive a drug information handout for each medication shipped and additional FDA Medication Guides as required.  Verified that patient has previously received a Conservation officer, historic buildings and a Surveyor, mining.    The patient or caregiver noted above participated in the development of this care plan and knows that they can request review of or adjustments to the care plan at any time.      All of the patient's questions and concerns have been addressed.    Roderic Palau   [facility] Specialty Pharmacist

## 2021-12-13 DIAGNOSIS — K7460 Unspecified cirrhosis of liver: Principal | ICD-10-CM

## 2021-12-13 DIAGNOSIS — B181 Chronic viral hepatitis B without delta-agent: Principal | ICD-10-CM

## 2021-12-13 DIAGNOSIS — K769 Liver disease, unspecified: Principal | ICD-10-CM

## 2021-12-13 MED ORDER — ALPRAZOLAM 0.25 MG TABLET
ORAL_TABLET | Freq: Once | ORAL | 0 refills | 0 days | Status: CP | PRN
Start: 2021-12-13 — End: 2021-12-13

## 2021-12-15 ENCOUNTER — Telehealth
Admit: 2021-12-15 | Discharge: 2021-12-16 | Payer: MEDICARE | Attending: Student in an Organized Health Care Education/Training Program | Primary: Student in an Organized Health Care Education/Training Program

## 2021-12-15 DIAGNOSIS — F319 Bipolar disorder, unspecified: Secondary | ICD-10-CM | POA: Diagnosis not present

## 2021-12-15 MED ORDER — LITHIUM CARBONATE ER 450 MG TABLET,EXTENDED RELEASE
ORAL_TABLET | 2 refills | 0 days | Status: CP
Start: 2021-12-15 — End: ?

## 2021-12-15 MED ORDER — VENLAFAXINE ER 150 MG CAPSULE,EXTENDED RELEASE 24 HR
ORAL_CAPSULE | Freq: Every day | ORAL | 0 refills | 30 days | Status: CP
Start: 2021-12-15 — End: 2022-01-14

## 2021-12-15 MED ORDER — RISPERIDONE 2 MG TABLET
ORAL_TABLET | Freq: Every evening | ORAL | 0 refills | 30 days | Status: CP
Start: 2021-12-15 — End: 2022-01-14

## 2021-12-15 NOTE — Unmapped (Signed)
[facility]  Psychiatry   Established Patient E&M Service - Outpatient       Assessment:    Nicholas [name] presents for follow-up evaluation. Today, pt is fairly stable. Possible mild improvement in mood, but still feeling low at times and having ongoing issues w/ low energy, motivation and anhedonia. He has transitioned from Vraylar to risperidone w/o any difficulty. Denies any concerns for TD on risperidone. He notes that he has not had much improvement in depression since starting risperidone. He and his husband do feel like this medication has been very helpful for him in the past. Given good tolerability and minimal improvement thus far, discussed increasing risperidone to 2 mg nightly. He was amenable. Continue to discuss targeting lower dose given his liver dz. He has been accepted from the resident CBT waitlist, but needs to call them back. We will plan for follow-up in 1 month. Patient will plan to come in person for AIMS.    Identifying Information:  Nicholas [name] is a 66 y.o. male with a history of  Bipolar I Disorder that was diagnosed in 2003. He established with this clinic in September 2022. At that time, he was prescribed vraylar, lithium, doxepin, and xanax with fairly good efficacy and tolerability.     Risk Assessment:  A full psychiatric risk assessment was conducted on 01/31/21 and risks do not appear significantly changed from that visit.   While future psychiatric events cannot be accurately predicted, the patient does not currently require acute inpatient psychiatric care and does not currently meet [facility] involuntary commitment criteria.     Plan:    #Bipolar I Disorder:  --Continue Lithium ER 450 mg QAM, 900 mg at bedtime. Recent level of 0.8.  --Vraylar discontinued in July 2023 w/o any worsening sx.  --INCREASE risperidone to 2 mg nightly (s7/23, i8/10)  --Continue hydroxyzine 25 mg BID PRN.   --Continue effexor 150 mg (s 4/11,i5/12, I5/22). Likely will want to taper given lack of efficacy. May consider trial of another antidepressant (when discussing previously seems only other true antidepressant was wellbutrin)  --Discussed behavioral activation today, Have previously discussed getting more plugged in with local community (maybe a church).  --Recommend discussing PT with PCP to help w/ his unsteadiness on feet/hoping this could increase activity level.  --Referred to resident CBT - planning to return Dr. Debbora Dus call regarding scheduling therapy.     #Medication monitoring:   --Recent Cr of 0.8 per careeverywhere records  Lab Results   Component Value Date    Cholesterol 136 06/06/2021    LDL Calculated 67 06/06/2021    HDL 33 (L) 06/06/2021    Triglycerides 181 (H) 06/06/2021     Hemoglobin A1C:   Lab Results   Component Value Date    Hemoglobin A1C 6.8 01/12/2021     Lithium Lvl   Date Value Ref Range Status   06/06/2021 0.8 0.5 - 1.2 mmol/L Final     TSH   Date Value Ref Range Status   06/06/2021 3.535 0.550 - 4.780 uIU/mL Final       Psychotherapy provided:  No billable psychotherapy service provided but brief supportive therapy was utilized.    Patient has been given this writer's contact information as well as the [facility] Psychiatry urgent line number. The patient has been instructed to call 911 for emergencies.    Subjective:    Interval History: Pt presents on-time for his visit. He shares his husband is home and present off-screen, but  did not participate actively in pt's appt today.     Pt reports he is sleeping better, waking up less often. He is off Vraylar and has started risperidone. States that he hasn't noticed much yet. Denies any issues w/ risperidone.     States that mood has been about the same. States mood is 3/4 (with 10 being the worst). Reports that is a little better than prior. Reports that he is still having low energy/motivation. He is not enjoying things as much. States he is able to enjoy things when he is with Nicholas [name]. States he feels a little down. Reports I feel a little when I wake up (notes this is different from prior, but goes away quickly). Denies any SI. Denies recent irritability.     Denies any concerns for TD on risperidone.    Reviewed med list. Denies any missed doses. Denies any SE.     He was amenable to increasing risperidone 2 mg QHS.     He did get a call from Dr. Jacob Moores about therapy. He is going to call back.     Discussed POC (see assessment above for details). No other q's or concerns today.    Objective:    Mental Status Exam:  Appearance:    Appears stated age, Well nourished, Well developed and Clean/Neat   Motor:   No abnormal movements   Speech/Language:    Normal rate, volume, tone, fluency and Language intact, well formed   Mood:   The same 3-4/10 (with 10 being severe depression)   Affect:   Constricted   Thought process and Associations:   Logical, linear, clear, coherent, goal directed   Abnormal/psychotic thought content:    Ongoing low mood, low motivation. Is able to enjoy things at times Denies SI. Remains future oriented. Not voicing any bizarre or delusional thought content on interview.   Perceptual disturbances:      He is not endorsing any AVH. No RIS on interview.     Other:        PHQ-9 PHQ-9 TOTAL SCORE   11/08/2021   6:18 PM 14   09/15/2021   8:40 PM 17     I personally spent 33 minutes face-to-face and non-face-to-face in the care of this patient, which includes all pre, intra, and post visit time on the date of service.  All documented time was specific to the E/M visit and does not include any procedures that may have been performed.          The patient reports they are currently: at home. I spent 15 minutes on the real-time audio and video with the patient on the date of service. I spent an additional 18 on pre- and post-visit activities on the date of service.     The patient was physically located in [place] or a state in which I am permitted to provide care. The patient and/or parent/guardian understood that s/he may incur co-pays and cost sharing, and agreed to the telemedicine visit. The visit was reasonable and appropriate under the circumstances given the patient's presentation at the time.    The patient and/or parent/guardian has been advised of the potential risks and limitations of this mode of treatment (including, but not limited to, the absence of in-person examination) and has agreed to be treated using telemedicine. The patient's/patient's family's questions regarding telemedicine have been answered.     If the visit was completed in an ambulatory setting, the patient and/or parent/guardian has also been advised  to contact their provider???s office for worsening conditions, and seek emergency medical treatment and/or call 911 if the patient deems either necessary.    Dennison Bulla, MD

## 2021-12-24 MED ORDER — RISPERIDONE 1 MG TABLET
ORAL_TABLET | 0 refills | 0 days
Start: 2021-12-24 — End: ?

## 2021-12-26 MED ORDER — RISPERIDONE 1 MG TABLET
ORAL_TABLET | 0 refills | 0 days
Start: 2021-12-26 — End: ?

## 2021-12-27 NOTE — Unmapped (Signed)
[facility] Refill Coordination Note    Specialty Medication(s) to be Shipped:   Infectious Disease: Vemlidy    Other medication(s) to be shipped: No additional medications requested for fill at this time     Nicholas [name], DOB: October 10, 1955  Phone: [phone] (home) [phone] (work)      All above HIPAA information was verified with patient.     Was a Nurse, learning disability used for this call? No    Completed refill call assessment today to schedule patient's medication shipment from the [facility] [phone]).  All relevant notes have been reviewed.     Specialty medication(s) and dose(s) confirmed: Regimen is correct and unchanged.   Changes to medications: [name] reports no changes at this time.  Changes to insurance: No  New side effects reported not previously addressed with a pharmacist or physician: None reported  Questions for the pharmacist: No    Confirmed patient received a Conservation officer, historic buildings and a Surveyor, mining with first shipment. The patient will receive a drug information handout for each medication shipped and additional FDA Medication Guides as required.       DISEASE/MEDICATION-SPECIFIC INFORMATION        N/A    SPECIALTY MEDICATION ADHERENCE     Medication Adherence    Patient reported X missed doses in the last month: 0  Specialty Medication: Vemlidy 25MG   Patient is on additional specialty medications: No                          Were doses missed due to medication being on hold? No    vemlidy 25mg   : 10 days of medicine on hand       REFERRAL TO PHARMACIST     Referral to the pharmacist: Not needed      [facility]     Shipping address confirmed in Epic.     Delivery Scheduled: Yes, Expected medication delivery date: 8/25.     Medication will be delivered via UPS to the prescription address in Epic WAM.    Nicholas [name]   [facility] Specialty Technician

## 2021-12-29 MED FILL — VEMLIDY 25 MG TABLET: ORAL | 30 days supply | Qty: 30 | Fill #5

## 2022-01-03 ENCOUNTER — Ambulatory Visit: Admit: 2022-01-03 | Discharge: 2022-01-04 | Payer: MEDICARE

## 2022-01-03 DIAGNOSIS — K7460 Unspecified cirrhosis of liver: Secondary | ICD-10-CM | POA: Diagnosis not present

## 2022-01-03 DIAGNOSIS — I8510 Secondary esophageal varices without bleeding: Secondary | ICD-10-CM | POA: Diagnosis not present

## 2022-01-03 DIAGNOSIS — B181 Chronic viral hepatitis B without delta-agent: Secondary | ICD-10-CM | POA: Diagnosis not present

## 2022-01-03 DIAGNOSIS — K766 Portal hypertension: Secondary | ICD-10-CM | POA: Diagnosis not present

## 2022-01-03 DIAGNOSIS — K769 Liver disease, unspecified: Secondary | ICD-10-CM | POA: Diagnosis not present

## 2022-01-03 DIAGNOSIS — K7689 Other specified diseases of liver: Secondary | ICD-10-CM | POA: Diagnosis not present

## 2022-01-03 MED ADMIN — gadobutrol 1 mmol/mL intravenous solution/syringe: .1 mL/kg | INTRAVENOUS | @ 16:00:00 | Stop: 2022-01-03

## 2022-01-03 NOTE — Unmapped (Signed)
Following up on My Chart message sent to patient 8/23 to have his labs drawn when he was at [facility] today for an MRI    Mobile number rang and voicemail was full--unable to leave message    Left message on home number for the patient to return my call--left my direct phone number on the voicemail.    Lanora Manis RN

## 2022-01-10 MED ORDER — RISPERIDONE 2 MG TABLET
ORAL_TABLET | 0 refills | 0 days
Start: 2022-01-10 — End: ?

## 2022-01-11 MED ORDER — RISPERIDONE 2 MG TABLET
ORAL_TABLET | 0 refills | 0 days | Status: CP
Start: 2022-01-11 — End: ?

## 2022-01-13 MED ORDER — VENLAFAXINE ER 150 MG CAPSULE,EXTENDED RELEASE 24 HR
ORAL_CAPSULE | 0 refills | 0 days
Start: 2022-01-13 — End: ?

## 2022-01-16 MED ORDER — VENLAFAXINE ER 150 MG CAPSULE,EXTENDED RELEASE 24 HR
ORAL_CAPSULE | 0 refills | 0 days | Status: CP
Start: 2022-01-16 — End: ?

## 2022-01-16 NOTE — Unmapped (Unsigned)
[facility]  Psychiatry   Established Patient E&M Service - Outpatient       Assessment:    Nicholas [name] presents for follow-up evaluation. ***    Identifying Information:  Nicholas [name] is a 66 y.o. male with a history of  Bipolar I Disorder that was diagnosed in 2003. He established with this clinic in September 2022. At that time, he was prescribed vraylar, lithium, doxepin, and xanax with fairly good efficacy and tolerability.     Risk Assessment:  A full psychiatric risk assessment was conducted on 01/31/21 and risks do not appear significantly changed from that visit.   While future psychiatric events cannot be accurately predicted, the patient does not currently require acute inpatient psychiatric care and does not currently meet [facility] involuntary commitment criteria.     Plan:    #Bipolar I Disorder:  --Continue Lithium ER 450 mg QAM, 900 mg at bedtime. Recent level of 0.8.***  --Vraylar discontinued in July 2023 w/o any worsening sx.***  --INCREASE risperidone to 2 mg nightly (s7/23, i8/10)***  --Continue hydroxyzine 25 mg BID PRN.   --Continue effexor 150 mg (s 4/11,i5/12, I5/22). Likely will want to taper given lack of efficacy. May consider trial of another antidepressant (when discussing previously seems only other true antidepressant was wellbutrin)***  --Discussed behavioral activation today, Have previously discussed getting more plugged in with local community (maybe a church).  --Recommend discussing PT with PCP to help w/ his unsteadiness on feet/hoping this could increase activity level.  --Referred to resident CBT - planning to return Dr. Debbora Dus call regarding scheduling therapy.***     #Medication monitoring:   --Recent Cr of 0.8 per careeverywhere records  Lab Results   Component Value Date    Cholesterol 136 06/06/2021    LDL Calculated 67 06/06/2021    HDL 33 (L) 06/06/2021    Triglycerides 181 (H) 06/06/2021     Hemoglobin A1C:   Lab Results   Component Value Date    Hemoglobin A1C 6.8 01/12/2021     Lithium Lvl   Date Value Ref Range Status   06/06/2021 0.8 0.5 - 1.2 mmol/L Final     TSH   Date Value Ref Range Status   06/06/2021 3.535 0.550 - 4.780 uIU/mL Final       Psychotherapy provided:  No billable psychotherapy service provided.    Patient has been given this writer's contact information as well as the [facility] Psychiatry urgent line number. The patient has been instructed to call 911 for emergencies.    Subjective:    Interval History:***    Objective:    Mental Status Exam:  Appearance:    Appears stated age, Well nourished, Well developed and Clean/Neat   Motor:   No abnormal movements   Speech/Language:    Normal rate, volume, tone, fluency and Language intact, well formed   Mood:   The same 3-4/10 (with 10 being severe depression)***   Affect:   Constricted   Thought process and Associations:   Logical, linear, clear, coherent, goal directed   Abnormal/psychotic thought content:    Ongoing low mood, low motivation. Is able to enjoy things at times Denies SI. Remains future oriented. Not voicing any bizarre or delusional thought content on interview.***   Perceptual disturbances:      He is not endorsing any AVH. No RIS on interview.     Other:        PHQ-9 PHQ-9 TOTAL SCORE   11/08/2021  6:18 PM 14   09/15/2021   8:40 PM 17     I personally spent  *** minutes face-to-face and non-face-to-face in the care of this patient, which includes all pre, intra, and post visit time on the date of service.  All documented time was specific to the E/M visit and does not include any procedures that may have been performed.      {    Coding tips - Do not edit this text, it will delete upon signing of note!    Telephone visits [phone] for Physicians and APP???s and [phone] for Non- Physician Clinicians)- Only use minutes on the phone to determine level of service.    Video visits [phone]) - Use both minutes on video and pre/post minutes to determine level of service. :[id number]}  The patient reports they are currently: {patient location:81390}. I spent *** minutes on the {phone audio video visit:67489} with the patient on the date of service. I spent an additional *** minutes on pre- and post-visit activities on the date of service.     The patient was physically located in [place] or a state in which I am permitted to provide care. The patient and/or parent/guardian understood that s/he may incur co-pays and cost sharing, and agreed to the telemedicine visit. The visit was reasonable and appropriate under the circumstances given the patient's presentation at the time.    The patient and/or parent/guardian has been advised of the potential risks and limitations of this mode of treatment (including, but not limited to, the absence of in-person examination) and has agreed to be treated using telemedicine. The patient's/patient's family's questions regarding telemedicine have been answered.     If the visit was completed in an ambulatory setting, the patient and/or parent/guardian has also been advised to contact their provider???s office for worsening conditions, and seek emergency medical treatment and/or call 911 if the patient deems either necessary.        Dennison Bulla, MD

## 2022-01-20 DIAGNOSIS — B181 Chronic viral hepatitis B without delta-agent: Principal | ICD-10-CM

## 2022-01-20 MED ORDER — VEMLIDY 25 MG TABLET
ORAL_TABLET | Freq: Every day | ORAL | 5 refills | 30 days
Start: 2022-01-20 — End: ?

## 2022-01-22 MED ORDER — VEMLIDY 25 MG TABLET
ORAL_TABLET | Freq: Every day | ORAL | 2 refills | 30 days | Status: CP
Start: 2022-01-22 — End: ?
  Filled 2022-03-03: qty 30, 30d supply, fill #0

## 2022-01-22 NOTE — Unmapped (Signed)
Refill request for Vemlidy    LCV 07/14/21    Pt is overdue for labs ordered in March 2023 and pt has been notified to get labs done    -Lanora Manis ,Charity fundraiser

## 2022-01-26 MED ORDER — LITHIUM CARBONATE ER 450 MG TABLET,EXTENDED RELEASE
ORAL_TABLET | 2 refills | 0 days
Start: 2022-01-26 — End: ?

## 2022-01-27 MED ORDER — LITHIUM CARBONATE ER 450 MG TABLET,EXTENDED RELEASE
ORAL_TABLET | 2 refills | 0 days
Start: 2022-01-27 — End: ?

## 2022-01-27 NOTE — Unmapped (Signed)
[facility] Refill Coordination Note    Specialty Medication(s) to be Shipped:   Infectious Disease: Vemlidy    Other medication(s) to be shipped: No additional medications requested for fill at this time     Nicholas [name], DOB: 11-26-1955  Phone: [phone] (home) [phone] (work)      All above HIPAA information was verified with patient.     Was a Nurse, learning disability used for this call? No    Completed refill call assessment today to schedule patient's medication shipment from the [facility] [phone]).  All relevant notes have been reviewed.     Specialty medication(s) and dose(s) confirmed: Regimen is correct and unchanged.   Changes to medications: Choyce reports no changes at this time.  Changes to insurance: No  New side effects reported not previously addressed with a pharmacist or physician: None reported  Questions for the pharmacist: No    Confirmed patient received a Conservation officer, historic buildings and a Surveyor, mining with first shipment. The patient will receive a drug information handout for each medication shipped and additional FDA Medication Guides as required.       DISEASE/MEDICATION-SPECIFIC INFORMATION        N/A    SPECIALTY MEDICATION ADHERENCE     Medication Adherence    Patient reported X missed doses in the last month: 0  Specialty Medication: vemlidy 25mg   Patient is on additional specialty medications: No  Patient is on more than two specialty medications: No  Any gaps in refill history greater than 2 weeks in the last 3 months: no  Demonstrates understanding of importance of adherence: yes  Informant: patient  Reliability of informant: reliable  Provider-estimated medication adherence level: good  Patient is at risk for Non-Adherence: No  Reasons for non-adherence: no problems identified                  Confirmed plan for next specialty medication refill: delivery by pharmacy  Refills needed for supportive medications: not needed          Refill Coordination    Has the Patients' Contact Information Changed: No  Is the Shipping Address Different: No         Were doses missed due to medication being on hold? No    VEMLIDY 25 mg: 7 days of medicine on hand       REFERRAL TO PHARMACIST     Referral to the pharmacist: Not needed      [facility]     Shipping address confirmed in Epic.     Delivery Scheduled: Yes, Expected medication delivery date: 09/27.     Medication will be delivered via UPS to the prescription address in Epic WAM.    Antonietta Barcelona   [facility] Specialty Technician

## 2022-01-31 DIAGNOSIS — B181 Chronic viral hepatitis B without delta-agent: Principal | ICD-10-CM

## 2022-02-01 ENCOUNTER — Other Ambulatory Visit: Payer: Self-pay | Admitting: Nurse Practitioner

## 2022-02-01 NOTE — Unmapped (Signed)
Nicholas [name] 's vemlidy shipment will be delayed as a result of MAPs seeking manufacture assistance.     I have reached out to the patient  at [phone] and communicated the delay. We will call the patient back to reschedule the delivery upon resolution. We have not confirmed the new delivery date.

## 2022-02-08 MED ORDER — RISPERIDONE 2 MG TABLET
ORAL_TABLET | 0 refills | 0 days
Start: 2022-02-08 — End: ?

## 2022-02-09 ENCOUNTER — Ambulatory Visit: Admit: 2022-02-09 | Discharge: 2022-02-10 | Payer: MEDICARE

## 2022-02-09 DIAGNOSIS — Z79899 Other long term (current) drug therapy: Principal | ICD-10-CM

## 2022-02-09 DIAGNOSIS — K7460 Unspecified cirrhosis of liver: Principal | ICD-10-CM

## 2022-02-09 DIAGNOSIS — B181 Chronic viral hepatitis B without delta-agent: Principal | ICD-10-CM

## 2022-02-09 LAB — HEPATIC FUNCTION PANEL
ALBUMIN: 3.5 g/dL (ref 3.5–5.0)
ALKALINE PHOSPHATASE: 59 U/L (ref 46–116)
ALT (SGPT): 78 U/L (ref 12–78)
AST (SGOT): 43 U/L — ABNORMAL HIGH (ref 15–40)
BILIRUBIN DIRECT: 0.12 mg/dL (ref 0.10–0.30)
BILIRUBIN TOTAL: 0.5 mg/dL (ref 0.3–1.2)
PROTEIN TOTAL: 7.1 g/dL (ref 6.0–8.0)

## 2022-02-09 LAB — BASIC METABOLIC PANEL
ANION GAP: 6 mmol/L (ref 3–11)
BLOOD UREA NITROGEN: 11 mg/dL (ref 8–20)
BUN / CREAT RATIO: 11
CALCIUM: 9.1 mg/dL (ref 8.5–10.1)
CHLORIDE: 105 mmol/L (ref 98–107)
CO2: 29.3 mmol/L (ref 21.0–32.0)
CREATININE: 1 mg/dL (ref 0.80–1.30)
EGFR CKD-EPI (2021) MALE: 83 mL/min/{1.73_m2} (ref >=60)
GLUCOSE RANDOM: 172 mg/dL (ref 70–179)
POTASSIUM: 3.8 mmol/L (ref 3.5–5.0)
SODIUM: 140 mmol/L (ref 135–145)

## 2022-02-10 MED ORDER — RISPERIDONE 2 MG TABLET
ORAL_TABLET | 0 refills | 0 days | Status: CP
Start: 2022-02-10 — End: ?

## 2022-02-13 LAB — HEPATITIS B E ANTIGEN: HEPATITIS BE ANTIGEN: NEGATIVE

## 2022-02-13 LAB — HEPATITIS B E ANTIBODY: HEPATITIS BE ANTIBODY: NEGATIVE

## 2022-02-15 LAB — HEPATITIS B DNA, QUANTITATIVE, PCR
HBV DNA QUANT: DETECTED — AB
HBV DNA: <10 [IU]/mL — ABNORMAL HIGH

## 2022-02-19 MED ORDER — VENLAFAXINE ER 150 MG CAPSULE,EXTENDED RELEASE 24 HR
ORAL_CAPSULE | 0 refills | 0 days
Start: 2022-02-19 — End: ?

## 2022-02-20 MED ORDER — VENLAFAXINE ER 150 MG CAPSULE,EXTENDED RELEASE 24 HR
ORAL_CAPSULE | Freq: Every day | ORAL | 0 refills | 30 days | Status: CP
Start: 2022-02-20 — End: ?

## 2022-02-28 DIAGNOSIS — E1165 Type 2 diabetes mellitus with hyperglycemia: Secondary | ICD-10-CM | POA: Diagnosis not present

## 2022-03-01 LAB — COMPREHENSIVE METABOLIC PANEL
ALT: 39 IU/L (ref 0–44)
AST: 25 IU/L (ref 0–40)
Albumin/Globulin Ratio: 1.6 (ref 1.2–2.2)
Albumin: 3.7 g/dL — ABNORMAL LOW (ref 3.9–4.9)
Alkaline Phosphatase: 56 IU/L (ref 44–121)
BUN/Creatinine Ratio: 9 — ABNORMAL LOW (ref 10–24)
BUN: 9 mg/dL (ref 8–27)
Bilirubin Total: 0.5 mg/dL (ref 0.0–1.2)
CO2: 22 mmol/L (ref 20–29)
Calcium: 8.6 mg/dL (ref 8.6–10.2)
Chloride: 102 mmol/L (ref 96–106)
Creatinine, Ser: 0.98 mg/dL (ref 0.76–1.27)
Globulin, Total: 2.3 g/dL (ref 1.5–4.5)
Glucose: 250 mg/dL — ABNORMAL HIGH (ref 70–99)
Potassium: 4.1 mmol/L (ref 3.5–5.2)
Sodium: 139 mmol/L (ref 134–144)
Total Protein: 6 g/dL (ref 6.0–8.5)
eGFR: 85 mL/min/{1.73_m2} (ref >59)

## 2022-03-01 LAB — TSH: TSH: 2.98 u[IU]/mL (ref 0.450–4.500)

## 2022-03-01 LAB — LIPID PANEL
Chol/HDL Ratio: 4.2 ratio (ref 0.0–5.0)
Cholesterol, Total: 126 mg/dL (ref 100–199)
HDL: 30 mg/dL — ABNORMAL LOW (ref >39)
LDL Chol Calc (NIH): 68 mg/dL (ref 0–99)
Triglycerides: 162 mg/dL — ABNORMAL HIGH (ref 0–149)
VLDL Cholesterol Cal: 28 mg/dL (ref 5–40)

## 2022-03-01 LAB — T4, FREE: Free T4: 1.01 ng/dL (ref 0.82–1.77)

## 2022-03-02 ENCOUNTER — Ambulatory Visit: Payer: Medicare HMO | Admitting: "Endocrinology

## 2022-03-02 ENCOUNTER — Encounter: Payer: Self-pay | Admitting: "Endocrinology

## 2022-03-02 VITALS — BP 110/62 | HR 60 | Ht 70.0 in

## 2022-03-02 DIAGNOSIS — E559 Vitamin D deficiency, unspecified: Secondary | ICD-10-CM

## 2022-03-02 DIAGNOSIS — E782 Mixed hyperlipidemia: Secondary | ICD-10-CM

## 2022-03-02 DIAGNOSIS — E1165 Type 2 diabetes mellitus with hyperglycemia: Secondary | ICD-10-CM

## 2022-03-02 LAB — POCT GLYCOSYLATED HEMOGLOBIN (HGB A1C): HbA1c, POC (controlled diabetic range): 6.6 % (ref 0.0–7.0)

## 2022-03-02 NOTE — Patient Instructions (Signed)

## 2022-03-02 NOTE — Progress Notes (Signed)
Endocrinology Follow Up Note       03/02/2022, 7:44 PM   Subjective:    Patient ID: Angel [name], male    DOB: 03/23/1956.  Angel [name] is being seen in follow up after being seen in consultation for management of currently uncontrolled symptomatic diabetes requested by  [name], PA-C.   Past Medical History:  Diagnosis Date   Bipolar disorder ([place])    Cirrhosis of liver without mention of alcohol    Depression    GERD (gastroesophageal reflux disease)    Headache    Hepatitis B    Hepatitis E. 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 has lost 10 lbs since pre-visit, intentionally with diet and walking    Past Surgical History:  Procedure Laterality Date   APPENDECTOMY     COLONOSCOPY     POLYPECTOMY     UPPER GASTROINTESTINAL ENDOSCOPY      Social History   Socioeconomic History   Marital status: Single    Spouse name: [name]   Number of children: 0   Years of education: Not on file   Highest education level: Master's degree (e.g., MA, MS, MEng, MEd, MSW, MBA)  Occupational History   Occupation: retired    Fish farm manager: NOT EMPLOYED  Tobacco Use   Smoking status: Former    Types: Cigarettes    Quit date: 05/08/2009    Years since quitting: 12.8   Smokeless tobacco: Never  Vaping Use   Vaping Use: Never used  Substance and Sexual Activity   Alcohol use: No    Alcohol/week: 0.0 standard drinks of alcohol   Drug use: No   Sexual activity: Not Currently  Other Topics Concern   Not on file  Social History Narrative   Patient is right-handed. He is married to same sex partner. He gets little exercise.   Caffeine: 4 glasses of tea max/day   Social Determinants of Health   Financial Resource Strain: Not on file   Food Insecurity: Not on file  Transportation Needs: Not on file  Physical Activity: Not on file  Stress: Not on file  Social Connections: Not on file    Family History  Problem Relation Age of Onset   Heart disease Mother    Diabetes Father    Kidney failure Father    Kidney disease Father    Other Sister        GERD   Colon cancer Neg Hx    Rectal cancer Neg Hx    Stomach cancer Neg Hx    Esophageal cancer Neg Hx    Headache Neg Hx    Migraines Neg Hx     Outpatient Encounter Medications as of 03/02/2022  Medication Sig   Blood Glucose Monitoring Suppl (ACCU-CHEK AVIVA PLUS) w/Device KIT Check sugar TID . Dx code:R73.9   diphenoxylate-atropine (LOMOTIL) 2.5-0.025 MG tablet Take 1 tablet by mouth 4 (four) times daily as needed for diarrhea or loose stools.   glipiZIDE (GLUCOTROL XL)  5 MG 24 hr tablet TAKE 1 TABLET(5 MG) BY MOUTH DAILY WITH BREAKFAST   glucose blood (ONETOUCH VERIO) test strip TEST TWICE A DAY BEFORE BREAKFAST AND BEFORE BEDTIME   Lancet Devices (LANCING DEVICE) MISC 1 Device by Does not apply route as directed. Compatible with onetouch ultrasoft lancets   Lancets (ONETOUCH DELICA PLUS [id number]) MISC USE AS INSTRUCTED TO MONITOR GLUCOSE TWICE DAILY   lithium carbonate (ESKALITH) 450 MG CR tablet Take 1,400 mg by mouth daily.   Melatonin 10 MG SUBL Place 10 mg under the tongue at bedtime.   nadolol (CORGARD) 20 MG tablet Take 20 mg by mouth daily.   Pancrelipase, Lip-Prot-Amyl, (CREON PO) Take by mouth. 21000 units with meals and snacks   Tenofovir Alafenamide Fumarate (VEMLIDY) 25 MG TABS Take by mouth.   No facility-administered encounter medications on file as of 03/02/2022.    ALLERGIES: Allergies  Allergen Reactions   Aripiprazole Other (See Comments)    Tardive Dyskinesia   Aripiprazole     Other reaction(s): Other (See Comments) Tardive Dyskinesia   Invega [Paliperidone Er] Other (See Comments)    Tardive dyskinsia symptoms   Quetiapine  Diarrhea, Hives, Itching, Nausea And Vomiting, Nausea Only, Shortness Of Breath and Rash   Eszopiclone Other (See Comments)    Sleep walking   Escitalopram     Other reaction(s): Other (See Comments) Doesn't Work   Hydrocodone Itching   Hydrocodone Bit-Homatrop Mbr Rash   Lamictal [Lamotrigine] Rash   Lexapro [Escitalopram Oxalate] Other (See Comments)    Doesn't Work    VACCINATION STATUS: Immunization History  Administered Date(s) Administered   Influenza Split 04/11/2011   Influenza,inj,Quad PF,6+ Mos 04/09/2014, 01/19/2015, 03/27/2016, 02/27/2017, 03/14/2018, 01/02/2019, 02/26/2020   Influenza,inj,quad, With Preservative 03/21/2013   Influenza,trivalent, recombinat, inj, PF 04/11/2011   Pneumococcal Polysaccharide-23 03/21/2013   Tdap 10/07/2013   Zoster Recombinat (Shingrix) 01/02/2019, 03/04/2019   Zoster, Live 08/12/2015    Diabetes He presents for his follow-up diabetic visit. He has type 2 diabetes mellitus. The initial diagnosis of diabetes was made 2 months (Diagnosed approx 2 months ago) ago. His disease course has been improving. There are no hypoglycemic associated symptoms. Associated symptoms include foot paresthesias. Pertinent negatives for diabetes include no blurred vision, no fatigue, no foot ulcerations, no polydipsia, no polyuria and no weight loss. There are no hypoglycemic complications. Symptoms are improving. Diabetic complications include nephropathy and peripheral neuropathy. Risk factors for coronary artery disease include diabetes mellitus, dyslipidemia, family history, male sex, obesity and sedentary lifestyle. Current diabetic treatment includes diet and oral agent (monotherapy). He is compliant with treatment most of the time. His weight is decreasing steadily. He is following a generally unhealthy diet. When asked about meal planning, he reported none. He participates in exercise intermittently. His home blood glucose trend is decreasing steadily. His  breakfast blood glucose range is generally 130-140 mg/dl. His bedtime blood glucose range is generally 130-140 mg/dl. His overall blood glucose range is 130-140 mg/dl. (He presents today accompanied by his partner with continued improvement in his glycemic profile.  His point-of-care A1c is 6.6%.  No hypoglycemia.  Patient is only on glipizide 5 mg p.o. daily at breakfast.   ) An ACE inhibitor/angiotensin II receptor blocker is not being taken. He does not see a podiatrist.Eye exam is current.  Hyperlipidemia This is a chronic problem. The current episode started more than 1 year ago. The problem is uncontrolled. Recent lipid tests were reviewed and are variable. Exacerbating diseases include chronic renal disease, diabetes,  liver disease and obesity. Factors aggravating his hyperlipidemia include beta blockers and fatty foods. He is currently on no antihyperlipidemic treatment. Compliance problems include adherence to exercise.  Risk factors for coronary artery disease include diabetes mellitus, dyslipidemia, family history, obesity, male sex and a sedentary lifestyle.     Review of systems  Constitutional: + slow increase in body weight,  current Body mass index is 38.86 kg/m. , no fatigue, no subjective hyperthermia, no subjective hypothermia   Objective:     BP 110/62   Pulse 60   Ht _0  (1.778 m)   BMI 38.86 kg/m   Wt Readings from Last 3 Encounters:  11/29/21 270 lb 12.8 oz (122.8 kg)  11/02/21 275 lb (124.7 kg)  06/07/21 279 lb (126.6 kg)     BP Readings from Last 3 Encounters:  03/02/22 110/62  11/29/21 108/62  06/07/21 101/62      Physical Exam- Limited  Constitutional:  Body mass index is 38.86 kg/m. , not in acute distress, normal state of mind Eyes:  EOMI, no exophthalmos Neck: Supple     CMP ( most recent) CMP     Component Value Date/Time   NA 139 02/28/2022 1057   K 4.1 02/28/2022 1057   CL 102 02/28/2022 1057   CO2 22 02/28/2022 1057   GLUCOSE  250 (H) 02/28/2022 1057   GLUCOSE 119 (H) 10/06/2020 1552   BUN 9 02/28/2022 1057   CREATININE 0.98 02/28/2022 1057   CREATININE 1.00 03/10/2020 1011   CALCIUM 8.6 02/28/2022 1057   PROT 6.0 02/28/2022 1057   ALBUMIN 3.7 (L) 02/28/2022 1057   AST 25 02/28/2022 1057   ALT 39 02/28/2022 1057   ALKPHOS 56 02/28/2022 1057   BILITOT 0.5 02/28/2022 1057   GFRNONAA 94 05/06/2019 1324   GFRNONAA 92 02/09/2017 1527   GFRAA 109 05/06/2019 1324   GFRAA 106 02/09/2017 1527     Diabetic Labs (most recent): Lab Results  Component Value Date   HGBA1C 6.6 03/02/2022   HGBA1C 6.6 11/29/2021   HGBA1C 7.1 (A) 06/07/2021   MICROALBUR 10 01/12/2021   MICROALBUR 0.4 02/10/2020     Lipid Panel ( most recent) Lipid Panel     Component Value Date/Time   CHOL 126 02/28/2022 1057   TRIG 162 (H) 02/28/2022 1057   HDL 30 (L) 02/28/2022 1057   CHOLHDL 4.2 02/28/2022 1057   CHOLHDL 5.4 (H) 02/10/2020 1054   VLDL 32.8 09/28/2016 1659   LDLCALC 68 02/28/2022 1057   LDLCALC 93 02/10/2020 1054   LABVLDL 28 02/28/2022 1057      Lab Results  Component Value Date   TSH 2.980 02/28/2022   TSH 3.360 05/16/2021   TSH 0.72 11/28/2019   TSH 3.160 05/06/2019   TSH 2.43 10/11/2017   TSH 3.955 04/22/2015   TSH 2.87 10/07/2013   FREET4 1.01 02/28/2022   FREET4 0.91 05/16/2021           Assessment & Plan:   1) Uncontrolled Type 2 Diabetes with hyperglycemia He presents today accompanied by his partner with continued improvement in his glycemic profile.  He presents today accompanied by his partner with continued improvement in his glycemic profile.  His point-of-care A1c is 6.6%.  No hypoglycemia.  Patient is only on glipizide 5 mg p.o. daily at breakfast.   - Angel [name] has currently uncontrolled symptomatic type 2 DM since 66 years of age.  -Recent labs reviewed.  - I had a long discussion with him about the progressive  nature of diabetes and the pathology behind its complications. -his  diabetes is complicated by peripheral neuropathy and mild CKD and he remains at a high risk for more acute and chronic complications which include CAD, CVA, and retinopathy. These are all discussed in detail with him.  He will continue to benefit from lifestyle medicine. - he acknowledges that there is a room for improvement in his food and drink choices. - Suggestion is made for him to avoid simple carbohydrates  from his diet including Cakes, Sweet Desserts, Ice Cream, Soda (diet and regular), Sweet Tea, Candies, Chips, Cookies, Store Bought Juices, Alcohol , Artificial Sweeteners,  Coffee Creamer, and "Sugar-free" Products, Lemonade. This will help patient to have more stable blood glucose profile and potentially avoid unintended weight gain.  The following Lifestyle Medicine recommendations according to [facility]) were discussed and and offered to patient and he  agrees to start the journey:  A. Whole Foods, Plant-Based Nutrition comprising of fruits and vegetables, plant-based proteins, whole-grain carbohydrates was discussed in detail with the patient.   A list for source of those nutrients were also provided to the patient.  Patient will use only water or unsweetened tea for hydration. B.  The need to stay away from risky substances including alcohol, smoking; obtaining 7 to 9 hours of restorative sleep, at least 150 minutes of moderate intensity exercise weekly, the importance of healthy social connections,  and stress management techniques were discussed. C.  A full color page of  Calorie density of various food groups per pound showing examples of each food groups was provided to the patient.   - he has been scheduled with Jearld Fenton, RDE for diabetes education.  - I have approached him with the following individualized plan to manage  his diabetes and patient agrees:   -Based on his stable glycemic profile, he will not need additional medication  intervention for now.  He is advised to continue glipizide 5 mg XL p.o. daily at breakfast.    -He is encouraged to continue monitoring blood glucose twice daily, before breakfast and before bed, and to call the clinic if he has readings less than 70 or greater than 200 for 3 tests in a row.  - Specific targets for  A1c;  LDL, HDL,  and Triglycerides were discussed with the patient.  2) Blood Pressure /Hypertension: -His blood pressure is controlled to target.  he is advised to continue his current medications including Nadolol 20 mg p.o. daily with breakfast.  3) Lipids/Hyperlipidemia:    Review of his recent lipid panel from 06/06/21 showed controlled LDL at 62, overall improving from 82.  His triglycerides have improved to 162 from 334.   He has nonalcoholic liver disease , this lipid profile will greatly benefit his liver.     4)  Weight/Diet:  his Body mass index is 38.86 kg/m.  -  clearly complicating his diabetes care.   he is a candidate for weight loss. I discussed with him the fact that loss of 5 - 10% of his  current body weight will have the most impact on his diabetes management.  Exercise, and detailed carbohydrates information provided  -  detailed on discharge instructions.  5) Chronic Care/Health Maintenance: -he not on ACEI/ARB or Statin medications and is encouraged to initiate and continue to follow up with Ophthalmology, Dentist, Podiatrist at least yearly or according to recommendations, and advised to stay away from smoking. I have recommended yearly flu vaccine  and pneumonia vaccine at least every 5 years; moderate intensity exercise for up to 150 minutes weekly; and sleep for at least 7 hours a day.  - he is advised to maintain close follow up with [place], Percell Miller, PA-C for primary care needs, as well as his other providers for optimal and coordinated care.  I spent 33 minutes in the care of the patient today including review of labs from [place], Lipids, Thyroid Function,  Hematology (current and previous including abstractions from other facilities); face-to-face time discussing  his blood glucose readings/logs, discussing hypoglycemia and hyperglycemia episodes and symptoms, medications doses, his options of short and long term treatment based on the latest standards of care / guidelines;  discussion about incorporating lifestyle medicine;  and documenting the encounter. Risk reduction counseling performed per USPSTF guidelines to reduce  obesity and cardiovascular risk factors.     Please refer to Patient Instructions for Blood Glucose Monitoring and Insulin/Medications Dosing Guide"  in media tab for additional information. Please  also refer to " Patient Self Inventory" in the Media  tab for reviewed elements of pertinent patient history.  Angel [name] participated in the discussions, expressed understanding, and voiced agreement with the above plans.  All questions were answered to his satisfaction. he is encouraged to contact clinic should he have any questions or concerns prior to his return visit.    Follow up plan: - Return in about 6 months (around 09/01/2022) for Bring Meter/CGM Device/Logs- A1c in Office.   Rayetta Pigg, [facility] [address] [place], [place] [address] Phone: [phone] Fax: [phone]  03/02/2022, 7:44 PM

## 2022-03-02 NOTE — Unmapped (Signed)
Voicemail from patient that he gets his [place] through [facility]    States he only has 3 days left of Vemlidy left     States that he was told the delay is that we are waiting on patient to be approved for manufacturer assistance    Patient said he was never to stop the medication because doing so could damage his liver    Chart reviewed--> paperwork for manufacturer assistance submitted on 10/24    Will route this message to Erskine Squibb & Tammy Sours in the [facility] to advise given that pt will be running out of medication in 3 days    -Lanora Manis RN

## 2022-03-03 NOTE — Unmapped (Addendum)
Spoke with patient.  Vemlidy Delivery rescheduled for 10/28 via UPS to prescription address. No questions for the pharmacist.

## 2022-03-04 DIAGNOSIS — I8510 Secondary esophageal varices without bleeding: Principal | ICD-10-CM

## 2022-03-04 DIAGNOSIS — K766 Portal hypertension: Principal | ICD-10-CM

## 2022-03-04 DIAGNOSIS — K7469 Other cirrhosis of liver: Principal | ICD-10-CM

## 2022-03-04 DIAGNOSIS — B181 Chronic viral hepatitis B without delta-agent: Principal | ICD-10-CM

## 2022-03-04 MED ORDER — NADOLOL 20 MG TABLET
ORAL_TABLET | Freq: Every day | ORAL | 1 refills | 90 days
Start: 2022-03-04 — End: 2022-08-31

## 2022-03-06 MED ORDER — NADOLOL 20 MG TABLET
ORAL_TABLET | Freq: Every day | ORAL | 1 refills | 90 days | Status: CP
Start: 2022-03-06 — End: 2022-09-02

## 2022-03-11 MED ORDER — RISPERIDONE 2 MG TABLET
ORAL_TABLET | 0 refills | 0 days
Start: 2022-03-11 — End: ?

## 2022-03-13 MED ORDER — RISPERIDONE 2 MG TABLET
ORAL_TABLET | 0 refills | 0 days | Status: CP
Start: 2022-03-13 — End: ?

## 2022-03-20 DIAGNOSIS — B181 Chronic viral hepatitis B without delta-agent: Principal | ICD-10-CM

## 2022-03-20 MED ORDER — VENLAFAXINE ER 150 MG CAPSULE,EXTENDED RELEASE 24 HR
ORAL_CAPSULE | Freq: Every day | ORAL | 0 refills | 30 days
Start: 2022-03-20 — End: ?

## 2022-03-22 MED ORDER — LITHIUM CARBONATE ER 450 MG TABLET,EXTENDED RELEASE
ORAL_TABLET | 2 refills | 0 days
Start: 2022-03-22 — End: ?

## 2022-03-23 ENCOUNTER — Telehealth
Admit: 2022-03-23 | Discharge: 2022-03-24 | Payer: MEDICARE | Attending: Student in an Organized Health Care Education/Training Program | Primary: Student in an Organized Health Care Education/Training Program

## 2022-03-23 DIAGNOSIS — K8689 Other specified diseases of pancreas: Principal | ICD-10-CM

## 2022-03-23 NOTE — Unmapped (Addendum)
Follow-up instructions:  --INCREASE effexor (venlafaxine) to 225 mg daily  -- Please continue taking your medications as prescribed for your mental health.   -- Do not make changes to your medications, including taking more or less than prescribed, unless under the supervision of your physician. Be aware that some medications may make you feel worse if abruptly stopped  -- Please refrain from using illicit substances, as these can affect your mood and could cause anxiety or other concerning symptoms.   -- Seek further medical care for any increase in symptoms or new symptoms such as thoughts of wanting to hurt yourself or hurt others.     Contact info:  Life-threatening emergencies: call 911 or go to the nearest ER for medical or psychiatric attention.     Issues that need urgent attention but are not life threatening: call the clinic outpatient frontdesk at [phone] for assistance.     Non-urgent routine concerns, questions, and refill requests: please send me a mychart message and I will get back to you within 2 business days. If you prefer to call, please call the front desk at [phone].    Regarding appointments:  - If you need to cancel your appointment, we ask that you call [phone] at least 24 hours before your scheduled appointment.  - If for any reason you arrive 15 minutes later than your scheduled appointment time, you may not be seen and your visit may be rescheduled.  - Please remember that we will not automatically reschedule missed appointments.  - If you miss two (2) appointments without letting us know in advance, you will likely be referred to a provider in your community.  - We will do our best to be on time. Sometimes an emergency will arise that might cause your clinician to be late. We will try to inform you of this when you check in for your appointment. If you wait more than 15 minutes past your appointment time without such notice, please speak with the front desk staff.    In the event of bad weather, the clinic staff will attempt to contact you, should your appointment need to be rescheduled. Additionally, you can call the Patient Weather Line [phone] for system-wide clinic status    For more information and reminders regarding clinic policies (these were provided when you were admitted to the clinic), please ask the front desk.

## 2022-03-23 NOTE — Unmapped (Signed)
[facility] Refill Coordination Note    Specialty Medication(s) to be Shipped:   Infectious Disease: Vemlidy    Other medication(s) to be shipped: No additional medications requested for fill at this time     Nicholas [name], DOB: 1955/05/16  Phone: [phone] (home) [phone] (work)      All above HIPAA information was verified with patient.     Was a Nurse, learning disability used for this call? No    Completed refill call assessment today to schedule patient's medication shipment from the [facility] [phone]).  All relevant notes have been reviewed.     Specialty medication(s) and dose(s) confirmed: Regimen is correct and unchanged.   Changes to medications: Shaul reports no changes at this time.  Changes to insurance: No  New side effects reported not previously addressed with a pharmacist or physician: None reported  Questions for the pharmacist: No    Confirmed patient received a Conservation officer, historic buildings and a Surveyor, mining with first shipment. The patient will receive a drug information handout for each medication shipped and additional FDA Medication Guides as required.       DISEASE/MEDICATION-SPECIFIC INFORMATION        N/A    SPECIALTY MEDICATION ADHERENCE     Medication Adherence    Patient reported X missed doses in the last month: 0  Specialty Medication: vemlidy  Patient is on additional specialty medications: No  Patient is on more than two specialty medications: No  Any gaps in refill history greater than 2 weeks in the last 3 months: no  Demonstrates understanding of importance of adherence: yes  Informant: patient  Reliability of informant: reliable  Provider-estimated medication adherence level: good  Patient is at risk for Non-Adherence: No  Reasons for non-adherence: no problems identified                  Confirmed plan for next specialty medication refill: delivery by pharmacy  Refills needed for supportive medications: not needed          Refill Coordination    Has the Patients' Contact Information Changed: No  Is the Shipping Address Different: No         Were doses missed due to medication being on hold? No    Vemlidy  25 mg: 5 days of medicine on hand       REFERRAL TO PHARMACIST     Referral to the pharmacist: Not needed      [facility]     Shipping address confirmed in Epic.     Delivery Scheduled: Yes, Expected medication delivery date: 11/21.     Medication will be delivered via UPS to the prescription address in Epic WAM.    Antonietta Barcelona   [facility] Specialty Technician

## 2022-03-23 NOTE — Unmapped (Signed)
[facility]  Psychiatry   Established Patient E&M Service - Outpatient       Assessment:    Nicholas [name] presents for follow-up evaluation. Today, Nicholas [name] is stable. He and his husband both believe that depression is unchanged from prior. He is still spending a lot of time in bed, feeling down. Does not seem there has been any significant improvement in symptoms since starting risperidone. One notable contributor is ongoing diarrhea. He feels down about the chronicity and severity of this issue. Furthermore, this seems to be one thing that is keeping him from engaging in enjoyable activities/keeping him more isolated. He is tolerating medications well. We discussed that increasing risperidone or venlafaxine further may be of little benefit given he has not responded at all to current medication doses. Titration of his risperidone is also limited by liver dz. He and his husband both feel he has responded well to effexor in the past. He preferred to trial higher dose of effexor rather than cross-tapering to another antidepressant. We did discuss potential risks/benefits. He was amenable to increasing dose and he and his husband are aware of need to be alert to potential for mania w/ this medication. We did briefly discuss therapy, but this is not something he is interested in at present.    Identifying Information:  Nicholas [name] is a 66 y.o. male with a history of  Bipolar I Disorder that was diagnosed in 2003. He established with this clinic in September 2022. At that time, he was prescribed vraylar, lithium, doxepin, and xanax with fairly good efficacy and tolerability.     Risk Assessment:  A full psychiatric risk assessment was conducted on 01/31/21 and risks do not appear significantly changed from that visit.   While future psychiatric events cannot be accurately predicted, the patient does not currently require acute inpatient psychiatric care and does not currently meet Nicholas [name] involuntary commitment criteria.     Plan:    #Bipolar I Disorder:  --Continue Lithium ER 450 mg QAM, 900 mg at bedtime. Recent level of 0.8.  --Vraylar discontinued in July 2023 w/o any worsening sx.  --Contnue risperidone to 2 mg nightly (s7/23, i8/10)  --Continue hydroxyzine 25 mg BID PRN.   --INCREASE Effexor to 225 mg (s 4/11,i5/12, I5/22, i11/6).  --Discussed behavioral activation today, Have previously discussed getting more plugged in with local community (maybe a church).  --Recommend discussing PT with PCP to help w/ his unsteadiness on feet/hoping this could increase activity level.  --Consider therapy, previously referred to resident CBT but patient never scheduled.     #Medication monitoring:   --Recent Cr of 0.8 per careeverywhere records  Lab Results   Component Value Date    Cholesterol 136 06/06/2021    LDL Calculated 67 06/06/2021    HDL 33 (L) 06/06/2021    Triglycerides 181 (H) 06/06/2021     Hemoglobin A1C:   Lab Results   Component Value Date    Hemoglobin A1C 6.8 01/12/2021     Lithium Lvl   Date Value Ref Range Status   06/06/2021 0.8 0.5 - 1.2 mmol/L Final     TSH   Date Value Ref Range Status   06/06/2021 3.535 0.550 - 4.780 uIU/mL Final       Psychotherapy provided:  No billable psychotherapy service provided but brief supportive therapy was utilized.    Patient has been given this writer's contact information as well as the [facility] Psychiatry urgent line number. The patient has been  instructed to call 911 for emergencies.    Subjective:    Interval History:Pt states that he is alright. Reports that he was hoping rispseridone would help more than it has. States that he feels like his mood has been worse.     Denies any stressors or triggers for worsening depression.    Nicholas [name] is present off video. States that he is still spending a lot of time in bed. Pt's husband reports he is still having a lot of diarrhea. States that controlling his BMs is a problem. He sees a GI at [facility].     His husband shares that 6 months ago pt returned to [facility] to see the doctor there. They didn't have a good experience with that physician. Nicholas [name] reports that they have really been working to manage his diet (he is eating much less meat/animal products). Nicholas [name] reports that Nicholas [name] has really significant diarrhea (that is so fast that he will soil his clothes).    Nicholas [name] feels like Nicholas [name]'s depression is not severe, but not any better. States they spent a large amonut of time in [place], [place]. He reports Nicholas [name] did fairly well there. They were doing more things, going out places. He reports when no one is around Nicholas [name] becomes a recluse.     One part of isolation probably is related to the diarrhea.     He is no longer taking any medication for sleep.    His primary care doctor is at [facility]. He is plugged in w/ hepatology at [facility]. He has pancreatic enzyme insufficency.     Nicholas [name] does take a lot of vitamins.     Denies any SI.     Discussed POC. See assessment above for details. No other questions or concerns today.  Objective:    Mental Status Exam:  Appearance:    Appears stated age, Well nourished, Well developed and Clean/Neat   Motor:   No abnormal movements   Speech/Language:    Normal rate, volume, tone, fluency and Language intact, well formed   Mood:   The same   Affect:   Constricted   Thought process and Associations:   Logical, linear, clear, coherent, goal directed   Abnormal/psychotic thought content:    Ongoing low mood, low motivation. Is able to enjoy things at times Denies SI. Remains future oriented. Not voicing any bizarre or delusional thought content on interview.   Perceptual disturbances:      He is not endorsing any AVH. No RIS on interview.     Other:        PHQ-9 PHQ-9 TOTAL SCORE   11/08/2021   6:18 PM 14   09/15/2021   8:40 PM 17     I personally spent 37 minutes face-to-face and non-face-to-face in the care of this patient, which includes all pre, intra, and post visit time on the date of service.  All documented time was specific to the E/M visit and does not include any procedures that may have been performed.      The patient reports they are physically located in [place] and is currently: at home. I conducted a audio/video visit. I spent  35m 26s on the video call with the patient. I spent an additional 10 minutes on pre- and post-visit activities on the date of service .         Dennison Bulla, MD

## 2022-03-24 MED ORDER — LITHIUM CARBONATE ER 450 MG TABLET,EXTENDED RELEASE
ORAL_TABLET | 2 refills | 0 days | Status: CP
Start: 2022-03-24 — End: ?

## 2022-03-24 MED ORDER — VENLAFAXINE ER 75 MG CAPSULE,EXTENDED RELEASE 24 HR
ORAL_CAPSULE | Freq: Every day | ORAL | 1 refills | 30 days | Status: CP
Start: 2022-03-24 — End: 2022-03-24

## 2022-03-24 MED ORDER — VENLAFAXINE ER 225 MG TABLET,EXTENDED RELEASE 24 HR
ORAL_TABLET | Freq: Every day | ORAL | 1 refills | 30 days | Status: CP
Start: 2022-03-24 — End: 2022-05-23

## 2022-03-24 NOTE — Unmapped (Signed)
Platform PA is being on (ex: [employer] call center, [employer], [email or web address], ect...): [email or web address]    Company reviewing PA claim/Form used (may be different from medical benefits): aetna medicare    Name of medication and strength: venlafaxine (EFFEXOR-XR) 75 MG 24 hr capsule     Directions: Take 3 capsules (225 mg total) by mouth daily.     Current status of prior authorization (waiting on response/complete): complete    Results from insurance company (approve/deny): approve    if approved, approval dates: Authorization Expiration Date: 05/07/2022     if denied, what needs to be done to get medication approved:    Prior authorization number or Key: Key: [id number]

## 2022-03-27 MED ORDER — VENLAFAXINE ER 75 MG CAPSULE,EXTENDED RELEASE 24 HR
ORAL_CAPSULE | Freq: Every day | ORAL | 1 refills | 30 days | Status: CP
Start: 2022-03-27 — End: 2022-05-26

## 2022-03-27 MED FILL — VEMLIDY 25 MG TABLET: ORAL | 30 days supply | Qty: 30 | Fill #1

## 2022-04-20 NOTE — Unmapped (Signed)
[facility] Refill Coordination Note    Specialty Medication(s) to be Shipped:   Infectious Disease: Vemlidy    Other medication(s) to be shipped: No additional medications requested for fill at this time     Nicholas [name], DOB: [birth year]  Phone: [phone] (home) [phone] (work)      All above HIPAA information was verified with patient.     Was a Nurse, learning disability used for this call? No    Completed refill call assessment today to schedule patient's medication shipment from the [facility] [phone]).  All relevant notes have been reviewed.     Specialty medication(s) and dose(s) confirmed: Regimen is correct and unchanged.   Changes to medications: Alhaji reports no changes at this time.  Changes to insurance: No  New side effects reported not previously addressed with a pharmacist or physician: None reported  Questions for the pharmacist: No    Confirmed patient received a Conservation officer, historic buildings and a Surveyor, mining with first shipment. The patient will receive a drug information handout for each medication shipped and additional FDA Medication Guides as required.       DISEASE/MEDICATION-SPECIFIC INFORMATION        N/A    SPECIALTY MEDICATION ADHERENCE     Medication Adherence    Patient reported X missed doses in the last month: 0  Specialty Medication: vemlidy 25mg   Patient is on additional specialty medications: No  Any gaps in refill history greater than 2 weeks in the last 3 months: no  Demonstrates understanding of importance of adherence: yes  Informant: patient  Reliability of informant: reliable              Confirmed plan for next specialty medication refill: delivery by pharmacy  Refills needed for supportive medications: not needed              Were doses missed due to medication being on hold? No    Vemlidy  25 mg: 10 days of medicine on hand       REFERRAL TO PHARMACIST     Referral to the pharmacist: Not needed      [facility]     Shipping address confirmed in Epic.     Delivery Scheduled: Yes, Expected medication delivery date: 12/19.     Medication will be delivered via UPS to the prescription address in Epic WAM.    Valere Dross   [facility] Specialty Technician

## 2022-04-21 MED ORDER — RISPERIDONE 2 MG TABLET
ORAL_TABLET | Freq: Every day | ORAL | 0 refills | 30 days | Status: CP
Start: 2022-04-21 — End: 2022-05-21

## 2022-04-24 MED FILL — VEMLIDY 25 MG TABLET: ORAL | 30 days supply | Qty: 30 | Fill #2

## 2022-05-18 ENCOUNTER — Telehealth
Admit: 2022-05-18 | Discharge: 2022-05-19 | Payer: MEDICARE | Attending: Student in an Organized Health Care Education/Training Program | Primary: Student in an Organized Health Care Education/Training Program

## 2022-05-18 DIAGNOSIS — F3130 Bipolar disorder, current episode depressed, mild or moderate severity, unspecified: Secondary | ICD-10-CM | POA: Diagnosis not present

## 2022-05-18 DIAGNOSIS — Z79899 Other long term (current) drug therapy: Secondary | ICD-10-CM | POA: Diagnosis not present

## 2022-05-18 MED ORDER — CARIPRAZINE 1.5 MG CAPSULE
ORAL_CAPSULE | Freq: Every day | ORAL | 0 refills | 60 days | Status: CP
Start: 2022-05-18 — End: ?

## 2022-05-18 MED ORDER — VENLAFAXINE ER 75 MG CAPSULE,EXTENDED RELEASE 24 HR
ORAL_CAPSULE | Freq: Every day | ORAL | 1 refills | 30 days | Status: CP
Start: 2022-05-18 — End: 2022-07-17

## 2022-05-18 NOTE — Unmapped (Unsigned)
[facility]  Psychiatry   Established Patient E&M Service - Outpatient       Assessment:    Nicholas [name] presents for follow-up evaluation. Today, Nicholas [name] is fairly stable. Notes that his mood is mildly improved since our last visit. Still feels like he is at 4 or 5/10 (10 being worst depression). He is enjoying some things. Still is not getting out of the house much or doing too much with his time when he is on his own. Tends to be more active w/ his husband and mood benefits when he has more going on and is getting a bit more physical activity. Tolerating Effexor well. 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 Noticed this a few months ago (messaged on 12/5, but states it was happening before then). He has noticed facial twitching (which was observable throughout interview, more pronounced in forehead) as well as some tongue movements (tongue hitting the top of his mouth). Did have him stick out his tongue and did not observe any abnormal or involuntary movements there during interview.***    Identifying Information:  Nicholas [name] is a 67 y.o. male with a history of  Bipolar I Disorder that was diagnosed in 2003. He established with this clinic in September 2022. At that time, he was prescribed vraylar, lithium, doxepin, and xanax with fairly good efficacy and tolerability.     Risk Assessment:  A full psychiatric risk assessment was conducted on 01/31/21 and risks do not appear significantly changed from that visit.   While future psychiatric events cannot be accurately predicted, the patient does not currently require acute inpatient psychiatric care and does not currently meet [facility] involuntary commitment criteria.     Plan:    #Bipolar I Disorder:  ***  --Continue Lithium ER 450 mg QAM, 900 mg at bedtime. Recent level of 0.8.  --Vraylar discontinued in July 2023 w/o any worsening sx.  --Contnue risperidone to 2 mg nightly (s7/23, i8/10)  --Continue hydroxyzine 25 mg BID PRN.   --Continue Effexor to 225 mg (s 4/11,i5/12, I5/22, i11/6).  --Discussed behavioral activation today, Have previously discussed getting more plugged in with local community (maybe a church).  --Recommend discussing PT with PCP to help w/ his unsteadiness on feet/hoping this could increase activity level.***  --Consider therapy, previously referred to resident CBT but patient never scheduled.     #Medication monitoring:   --Recent Cr of 0.8 per careeverywhere records  Lab Results   Component Value Date    Cholesterol 136 06/06/2021    LDL Calculated 67 06/06/2021    HDL 33 (L) 06/06/2021    Triglycerides 181 (H) 06/06/2021     Hemoglobin A1C:   Lab Results   Component Value Date    Hemoglobin A1C 6.8 01/12/2021     Lithium Lvl   Date Value Ref Range Status   06/06/2021 0.8 0.5 - 1.2 mmol/L Final     TSH   Date Value Ref Range Status   06/06/2021 3.535 0.550 - 4.780 uIU/mL Final       Psychotherapy provided:  No billable psychotherapy service provided but brief supportive therapy was utilized.    Patient has been given this writer's contact information as well as the [facility] Psychiatry urgent line number. The patient has been instructed to call 911 for emergencies.    Subjective:    Interval History: Pt states that he is feeling better recently. He reports that he is 4-5/10 (with 10 being severe depression). States that he is  sleeping better. Reports he is moving around more. States the is eating well. He does feel like mood is a bit better. He is enjoying things sometimes.     He recently went to [facility] for a few days. They have another trip coming up to [facility]. Will go for 6 weeks. He reports that he enjoyed being in [place]. He reports that he was doing some more walking in [place].     He is noticing some TD. Has noticed his tongue moving up to the top of his mouth. He reports it has been ongoing for a few months. He messaged me about it on 12/5. Concerned that it is getting worse over time.    He has been walking with his dogs some at home. Doesn't walk every day. He does have an area around his home he could walk.     Denies missing medication doses. Denies any new SE (aside from TD).     Denies significant anxiety.    He is not doing too much w/ his time. He would prefer to go with [name]. He is not sure if he would go on his own. States he would worry some about fitting in.     Reviewed some about past medication trials. He has tried abilify. Thinks it was 10 years. His allergy list indicates abilify caused TD. He does not recall.    Discussed POC. Open to transitioning back to vraylar given previous stability and tolerability with that medication. Think PA should be approved given TD w/ invega, risperidone, abilify, bad rxn to seroquel and likely not a good candidate for zyprexa given concern for wt gain + dx of diabetes. No other Q's or concerns reported today.      Objective:    Mental Status Exam:  Appearance:    Appears stated age, Well nourished, Well developed and Clean/Neat   Motor:   No abnormal movements   Speech/Language:    Normal rate, volume, tone, fluency and Language intact, well formed   Mood:   The same***   Affect:   Constricted   Thought process and Associations:   Logical, linear, clear, coherent, goal directed   Abnormal/psychotic thought content:    Ongoing low mood, low motivation. Is able to enjoy things at times Denies SI. Remains future oriented. Not voicing any bizarre or delusional thought content on interview.   Perceptual disturbances:      He is not endorsing any AVH. No RIS on interview.     Other:        PHQ-9 PHQ-9 TOTAL SCORE   11/08/2021   6:18 PM 14   09/15/2021   8:40 PM 17     I personally spent *** minutes face-to-face and non-face-to-face in the care of this patient, which includes all pre, intra, and post visit time on the date of service.  All documented time was specific to the E/M visit and does not include any procedures that may have been performed.    {    Coding tips - Do not edit this text, it will delete upon signing of note!    Telephone visits [phone] for Physicians and APPs and [phone] for Non- Physician Clinicians)- Only use minutes on the phone to determine level of service.    Video visits [phone]) - Use either level of medical decision making just as an in-person visit OR time which includes both minutes on video and pre/post minutes to determine the level of service.      :  [id number]}  The patient reports they are physically located in [place] and is currently: {patient location:[address]}. I conducted a {phone audio video visit:101857} .           Dennison Bulla, MD

## 2022-05-18 NOTE — Unmapped (Addendum)
Follow-up instructions:  Week 1: DECREASE your risperidone (risperdal) to 1 mg per day  Week 2: START vraylar 1.5 mg daily. Continue with risperidone 1 mg daily.   Week 3: STOP risperidone. INCREASE Vraylar to 3 mg daily.    For follow-up -- I have in-person availability on 2/1 at 4 PM, 2/8 at 11 AM or 4 PM, 2/15 at 3 or 4 PM. You can call the clinic for additional options. Please let me know about scheduling ASAP. I am not able to hold these appointments open.      -- Please continue taking your medications as prescribed for your mental health.   -- Do not make changes to your medications, including taking more or less than prescribed, unless under the supervision of your physician. Be aware that some medications may make you feel worse if abruptly stopped  -- Please refrain from using illicit substances, as these can affect your mood and could cause anxiety or other concerning symptoms.   -- Seek further medical care for any increase in symptoms or new symptoms such as thoughts of wanting to hurt yourself or hurt others.     Contact info:  Life-threatening emergencies: call 911 or go to the nearest ER for medical or psychiatric attention.     Issues that need urgent attention but are not life threatening: call the clinic outpatient frontdesk at [phone] for assistance.     Non-urgent routine concerns, questions, and refill requests: please send me a mychart message and I will get back to you within 2 business days. If you prefer to call, please call the front desk at [phone].    Regarding appointments:  - If you need to cancel your appointment, we ask that you call [phone] at least 24 hours before your scheduled appointment.  - If for any reason you arrive 15 minutes later than your scheduled appointment time, you may not be seen and your visit may be rescheduled.  - Please remember that we will not automatically reschedule missed appointments.  - If you miss two (2) appointments without letting us know in advance, you will likely be referred to a provider in your community.  - We will do our best to be on time. Sometimes an emergency will arise that might cause your clinician to be late. We will try to inform you of this when you check in for your appointment. If you wait more than 15 minutes past your appointment time without such notice, please speak with the front desk staff.    In the event of bad weather, the clinic staff will attempt to contact you, should your appointment need to be rescheduled. Additionally, you can call the Patient Weather Line [phone] for system-wide clinic status    For more information and reminders regarding clinic policies (these were provided when you were admitted to the clinic), please ask the front desk.

## 2022-05-19 ENCOUNTER — Other Ambulatory Visit: Payer: Self-pay | Admitting: Nurse Practitioner

## 2022-05-22 DIAGNOSIS — B181 Chronic viral hepatitis B without delta-agent: Principal | ICD-10-CM

## 2022-05-22 MED ORDER — VEMLIDY 25 MG TABLET
ORAL_TABLET | Freq: Every day | ORAL | 2 refills | 30 days
Start: 2022-05-22 — End: ?

## 2022-05-22 NOTE — Unmapped (Signed)
[facility] Refill Coordination Note    Nicholas [name], DOB: 1956-02-26  Phone: [phone] (home) [phone] (work)      All above HIPAA information was verified with patient. Refill requested for [facility] to provider.        05/19/2022     2:37 PM   Specialty Rx Medication Refill Questionnaire   Which Medications would you like refilled and shipped? 5   Please list all current allergies: seraquel   Have you missed any doses in the last 30 days? No   Have you had any changes to your medication(s) since your last refill? No   How many days remaining of each medication do you have at home? 5   Have you experienced any side effects in the last 30 days? No   Please enter the full address (street address, city, state, zip code) where you would like your medication(s) to be delivered to. [address] [place], [place] [address]   Please specify on which day you would like your medication(s) to arrive. Note: if you need your medication(s) within 3 days, please call the pharmacy to schedule your order at [phone]  05/25/2022   Has your insurance changed since your last refill? No   Would you like a pharmacist to call you to discuss your medication(s)? No   Do you require a signature for your package? (Note: if we are billing Medicare Part B or your order contains a controlled substance, we will require a signature) No         Completed refill call assessment today to schedule patient's medication shipment from the [facility] [phone]).  All relevant notes have been reviewed.       Confirmed patient received a Conservation officer, historic buildings and a Surveyor, mining with first shipment. The patient will receive a drug information handout for each medication shipped and additional FDA Medication Guides as required.         REFERRAL TO PHARMACIST     Referral to the pharmacist: Not needed      [facility]     Shipping address confirmed in Epic.     Delivery Scheduled: Yes, Expected medication delivery date: 05/25/22.     Medication will be delivered via UPS to the prescription address in Epic WAM.    Tobi Bastos, PharmD   [facility] Specialty Pharmacist

## 2022-05-24 DIAGNOSIS — B181 Chronic viral hepatitis B without delta-agent: Principal | ICD-10-CM

## 2022-05-24 MED ORDER — VEMLIDY 25 MG TABLET
ORAL_TABLET | Freq: Every day | ORAL | 2 refills | 30 days | Status: CP
Start: 2022-05-24 — End: ?

## 2022-05-24 MED ORDER — CARIPRAZINE 1.5 MG CAPSULE
ORAL_CAPSULE | Freq: Every day | ORAL | 0 refills | 30 days | Status: CP
Start: 2022-05-24 — End: 2022-06-23

## 2022-05-24 NOTE — Unmapped (Signed)
Nicholas [name] 's vemlidy shipment will be delayed as a result of MAPs seeking manufacture assistance.     I have reached out to the patient  at [phone] and communicated the delay. We will call the patient back to reschedule the delivery upon resolution. We have not confirmed the new delivery date.

## 2022-05-29 DIAGNOSIS — B181 Chronic viral hepatitis B without delta-agent: Principal | ICD-10-CM

## 2022-05-29 MED ORDER — LITHIUM CARBONATE ER 450 MG TABLET,EXTENDED RELEASE
ORAL_TABLET | 2 refills | 0 days
Start: 2022-05-29 — End: ?

## 2022-05-29 MED ORDER — TENOFOVIR DISOPROXIL FUMARATE 300 MG TABLET
ORAL_TABLET | Freq: Every day | ORAL | 0 refills | 30 days | Status: CP
Start: 2022-05-29 — End: 2022-06-28
  Filled 2022-05-31: qty 30, 30d supply, fill #0

## 2022-05-29 MED ORDER — RISPERIDONE 2 MG TABLET
ORAL_TABLET | 0 refills | 0 days
Start: 2022-05-29 — End: ?

## 2022-05-29 NOTE — Unmapped (Signed)
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 However PA denied thus appeal submitted on 05/25/22. Reached out to pt to update on status of MFR approval process and check on supply. Pt reports only has #4 tabs left of Vemlidy.     Reviewed with Vallery Sa, and will bridge to TDF while waiting for [facility] approval. Sent rx to [facility]. Advised pt of plan and he's in agreement.     Park Breed, Pharm D., BCPS, BCGP, CPP  [facility]  [address]  [place], [place] [address]  [phone]

## 2022-05-29 NOTE — Unmapped (Signed)
[facility] Specialty Medication Onboarding    Specialty Medication: tenofovir disoproxil 300 mg tablet Stevie Kern)  Prior Authorization: Not Required   Financial Assistance: No assistance available  Final Copay/Day Supply: $28.02 / 30    Insurance Restrictions: None     Notes to Pharmacist: billed to expo card    The triage team has completed the benefits investigation and has determined that the patient is able to fill this medication at [facility]. Please contact the patient to complete the onboarding or follow up with the prescribing physician as needed.

## 2022-05-29 NOTE — Unmapped (Signed)
[facility]   Patient Onboarding/Medication Counseling    Mr.Aschoff is a 67 y.o. male with Hepatitis B who I am counseling today on initiation of therapy.  I am speaking to the patient.    Was a Nurse, learning disability used for this call? No    Verified patient's date of birth / HIPAA.    Specialty medication(s) to be sent: Infectious Disease: tenofovir disoproxil fumarate      Non-specialty medications/supplies to be sent: n/a      Medications not needed at this time: n/a         Tenofovir disoproxil fumarate 300mg  tablet (Viread 300mg  tablet)    The patient declined counseling on medication administration, missed dose instructions, goals of therapy, side effects and monitoring parameters, warnings and precautions, drug/food interactions, and storage, handling precautions, and disposal because they have taken the medication previously. The information in the declined sections below are for informational purposes only and was not discussed with patient.       Medication & Administration     Dosage: Take one tablet by mouth once daily    Administration: Take with or without food    Dosing: Renal Impairment: Adult  CrCl 30 to 49 mL/minute: 300 mg every 48 hours  CrCl 10 to 29 mL/minute: 300 mg every 72 to 96 hours  CrCl <10 mL/minute: No dosage adjustments provided in the manufacturer's labeling (has not been studied).  Hemodialysis: 300 mg following dialysis every 7 days or after approximately 12 hours of dialysis (usually once weekly assuming 3 dialysis sessions lasting about 4 hours each).  Adherence/Missed dose instructions:   Take a missed dose as soon as you remember. If it is close to the time for your next dose, skip the missed dose and go back to your next scheduled dose.  Do not take 2 doses at the same time or extra doses.  Avoid missing doses as it can result in development of resistance.    Goals of Therapy     To keep HBV DNA levels undetectable.  To decrease the morbidity and mortality related to chronic hepatitis B.    Side Effects & Monitoring Parameters   HBV patients with compensated liver disease:   Nausea  HBV patients with decompensated liver disease:   Abdominal pain  Nausea  Insomnia  Pruritus  Vomiting  Dizziness  Fever     The following side effects should be reported to the provider:  Signs of an allergic reaction like rash, hives, itching, red, swollen, blistered, or peeling skin with or without fever. If you have wheezing, tightness in the chest or throat, trouble breathing, swallowing, or talking, unusual hoarseness, or swelling of the mouth, face, lips, tongue, or throat, call 911 or go to emergency department of the closest hospital.  Signs of kidney problems like unable to pass urine, change in how much urine is passed, blood in the urine, or a big weight gain.  Signs of liver problems like dark urine, weakness, loss of appetite for several days or longer, persistent or worsening upset stomach or stomach pain with nausea and vomiting, light-colored stools, or yellow skin or eyes.   Signs of too much lactic acid in the blood (lactic acidosis) like weakness or being more tired than usual, unusual muscle pain, being short of breath or fast breathing, stomach pain with nausea and vomiting, cold or blue hands and feet, feel dizzy or lightheaded, or a fast or abnormal heartbeat..  Persistent or worsening bone pain, pain  in extremities, fractures and/or muscular pain may be manifestations of proximal renal tubulopathy and should prompt an evaluation of renal function in patients at risk of renal dysfunction.    Monitoring Parameters:  HIV status prior to initiation of therapy  HBV e antigen, HBV e antibody, and HBV DNA: every 3-6 months or as clinically indicated  HBV surface antigen, HBV surface Antibody: as clinically indicated   Serum creatinine, estimated creatinine clearance, urine glucose, and urine protein prior to initiation of therapy and as clinically indicated during therapy. . Also assess serum phosphorus in chronic kidney disease.  Liver function tests (AST, ALT, Alk Phos, albumin, and bilirubin) and INR as clinically indicated  Bone density (patients with history of bone fracture or have risk factors for bone loss) as clinically indicated      Contraindications, Warnings, & Precautions     Lactic acidosis/hepatomegaly: Lactic acidosis and severe hepatomegaly with steatosis, sometimes fatal, have been reported with the use of nucleoside analogs, alone or in combination with other antiretrovirals. Suspend treatment in any patient who develops clinical or laboratory findings suggestive of lactic acidosis or pronounced hepatotoxicity (marked transaminase elevation may/may not accompany hepatomegaly and steatosis).  Hepatitis B acute exacerbation: [US Boxed Warning]: Discontinuation of anti-hepatitis B therapy may result in severe acute exacerbations of hepatitis B. Monitor clinical and laboratory data closely for several months after treatment discontinuation. If clinically indicated, anti-hepatitis B therapy may be resumed.  HIV-1 and HBV coinfection: Should not be used as a single agent for the treatment of HIV-1 due to resistance development risk.  Decreased bone mineral density: Consider assessment of BMD in patients with a history of pathologic fracture or other risk factors for osteoporosis or bone loss.  New onset or worsening renal impairment: Can include acute renal failure and Fanconi syndrome. Avoid administering tenofovir disoproxil fumarate with concurrent or recent use of nephrotoxic drugs.  Osteomalacia and renal dysfunction: May cause osteomalacia with proximal renal tubulopathy. Bone pain, extremity pain, fractures, arthralgias, weakness and muscle pain have been reported. In patients at risk for renal dysfunction, persistent or worsening bone or muscle symptoms should be evaluated..     Drug/Food Interactions     Medication list reviewed in Epic. The patient was instructed to inform the care team before taking any new medications or supplements. No drug interactions identified.   Drugs affecting renal function: Coadministration of tenofovir disoproxil fumarate with drugs that are eliminated by active tubular secretion may increase concentrations of tenofovir and/or the coadministered drug. Some examples include, but are not limited to, acyclovir, cidofovir, ganciclovir, valacyclovir, valganciclovir, aminoglycosides (e.g., gentamicin), and high-dose or multiple NSAIDs. Drugs that decrease renal function may increase concentrations of tenofovir.  HBV treatment: avoid combination with adefovir dipivoxil  HIV treatment: Coadministration of tenofovir disoproxil fumarate with certain HIV-1 protease inhibitors increases tenofovir concentrations. Monitor for evidence of tenofovir toxicity.  Tenofovir disoproxil fumarate increases didanosine concentrations.   Tenofovir disoproxil fumarate coadministration decreases atazanavir concentrations. When coadministered with tenofovir disoproxil fumarate, use atazanavir given with ritonavir.  HCV treatment: Coadministration of tenofovir disoproxil fumarate with certain HCV medications increase tenofovir concentrations. Monitor for evidence of tenofovir toxicity.  Ledipasvir/sofosbuvir  Sofosbuvur/velpatasvir    Storage, Handling Precautions, & Disposal     Store this medication at room temperature.  Store in the original container.   Keep lid tightly closed.  Store in a dry place. Do not store in a bathroom.  Keep all drugs in a safe place. Keep all drugs out of the reach of children  and pets.  Throw away unused or expired drugs. Do not flush down a toilet or pour down a drain unless you are told to do so. Check with your pharmacist if you have questions about the best way to throw out drugs. There may be drug take-back programs in your area.      Current Medications (including OTC/herbals), Comorbidities and Allergies     Current Outpatient Medications   Medication Sig Dispense Refill    cariprazine (VRAYLAR) 1.5 mg capsule Take 1 capsule (1.5 mg total) by mouth daily. 30 capsule 0    diphenoxylate-atropine (LOMOTIL) 2.5-0.025 mg per tablet Take 1 tablet by mouth four (4) times a day as needed for diarrhea.      glipiZIDE (GLUCOTROL XL) 5 MG 24 hr tablet Take 5 mg by mouth in the morning.      hydrOXYzine (ATARAX) 25 MG tablet Take 1 tablet (25 mg total) by mouth two (2) times a day as needed for anxiety. 60 tablet 0    lipase-protease-amylase (PANCREAZE) 37,000-97,300- 149,900 unit CpDR 3 caps with each meal and 2 with snacks      lithium (ESKALITH CR) 450 MG ER tablet TAKE 1 TABLET BY MOUTH EVERY MORNING AND 2 TABLETS EVERY EVENING 90 tablet 2    melatonin 10 mg cap Take 10 mg by mouth nightly.      nadoloL (CORGARD) 20 MG tablet Take 1 tablet (20 mg total) by mouth daily. 90 tablet 1    risperiDONE (RISPERDAL) 2 MG tablet Take 1 tablet (2 mg total) by mouth daily. 30 tablet 0    tenofovir alafenamide (VEMLIDY) 25 mg Tab tablet Take 1 tablet (25 mg total) by mouth daily. Take with food. 30 tablet 2    tenofovir disoproxil (VIREAD) 300 mg tablet Take 1 tablet (300 mg total) by mouth daily. 30 tablet 0    venlafaxine (EFFEXOR-XR) 75 MG 24 hr capsule Take 3 capsules (225 mg total) by mouth daily. 90 capsule 1     No current facility-administered medications for this visit.       Allergies   Allergen Reactions    Aripiprazole Other (See Comments)     Tardive Dyskinesia    Invega [Paliperidone] Other (See Comments)     Tardive dyskinsia symptoms    Escitalopram Other (See Comments)     Doesn't Work    Hydrocodone Itching    Lamotrigine Rash    Quetiapine Rash       Patient Active Problem List   Diagnosis    HBV (hepatitis B virus) infection    Bipolar disorder, unspecified    Bipolar I disorder, most recent episode depressed (CMS-HCC)    Hepatic cirrhosis (CMS-HCC)    Gastroesophageal reflux disease    Hepatitis B virus infection    Hyperglycemia History of colonic polyps       Reviewed and up to date in Epic.    Appropriateness of Therapy     Acute infections noted within Epic:  No active infections  Patient reported infection: None    Is medication and dose appropriate based on diagnosis and infection status? Yes    Prescription has been clinically reviewed: Yes      Baseline Quality of Life Assessment      How many days over the past month did your Hepatitis B  keep you from your normal activities? For example, brushing your teeth or getting up in the morning. 0    Financial Information     Medication Assistance provided:  None Required    Anticipated copay of $28.02 using the [employer] discount card reviewed with patient. Verified delivery address.    Delivery Information     Scheduled delivery date: 05/31/22    Expected start date: 06/02/22    Medication will be delivered via UPS to the prescription address in [facility].  This shipment will not require a signature.      Explained the services we provide at [facility] and that each month we would call to set up refills.  Stressed importance of returning phone calls so that we could ensure they receive their medications in time each month.  Informed patient that we should be setting up refills 7-10 days prior to when they will run out of medication.  A pharmacist will reach out to perform a clinical assessment periodically.  Informed patient that a welcome packet, containing information about our pharmacy and other support services, a Notice of Privacy Practices, and a drug information handout will be sent.      The patient or caregiver noted above participated in the development of this care plan and knows that they can request review of or adjustments to the care plan at any time.      Patient or caregiver verbalized understanding of the above information as well as how to contact the pharmacy at [phone] option 4 with any questions/concerns.  The pharmacy is open Monday through Friday 8:30am-4:30pm.  A pharmacist is available 24/7 via pager to answer any clinical questions they may have.    Patient Specific Needs     Does the patient have any physical, cognitive, or cultural barriers? No    Does the patient have adequate living arrangements? (i.e. the ability to store and take their medication appropriately) Yes    Did you identify any home environmental safety or security hazards? No    Patient prefers to have medications discussed with  Patient     Is the patient or caregiver able to read and understand education materials at a high school level or above? Yes    Patient's primary language is  English     Is the patient high risk? No    SOCIAL DETERMINANTS OF HEALTH     At the [facility], we have learned that life circumstances - like trouble affording food, housing, utilities, or transportation can affect the health of many of our patients.   That is why we wanted to ask: are you currently experiencing any life circumstances that are negatively impacting your health and/or quality of life? Patient declined to answer    Social Determinants of Health     Financial Resource Strain: Not on file   Internet Connectivity: Not on file   Food Insecurity: Not on file   Tobacco Use: Low Risk  (06/22/2020)    Patient History     Smoking Tobacco Use: Never     Smokeless Tobacco Use: Never     Passive Exposure: Not on file   Housing/Utilities: Not on file   Alcohol Use: Not on file   Transportation Needs: Not on file   Substance Use: Not on file   Health Literacy: Not on file   Physical Activity: Not on file   Interpersonal Safety: Not on file   Stress: Not on file   Intimate Partner Violence: Not on file   Depression: Not on file   Social Connections: Not on file       Would you be willing to  receive help with any of the needs that you have identified today? Not applicable       Roderic Palau, PharmD  [facility] Specialty Pharmacist

## 2022-05-30 MED ORDER — LITHIUM CARBONATE ER 450 MG TABLET,EXTENDED RELEASE
ORAL_TABLET | 2 refills | 0 days
Start: 2022-05-30 — End: ?

## 2022-05-31 MED ORDER — LURASIDONE 20 MG TABLET
ORAL_TABLET | Freq: Every day | ORAL | 0 refills | 30 days | Status: CP
Start: 2022-05-31 — End: 2022-06-30

## 2022-05-31 MED ORDER — RISPERIDONE 2 MG TABLET
ORAL_TABLET | 0 refills | 0 days
Start: 2022-05-31 — End: ?

## 2022-06-01 NOTE — Unmapped (Signed)
Called to check in regarding his medications. Spoke for ~20 minutes via phone. He does not think the Vraylar can be affordable even with a copay card.He reports that he has been taking 1 mg risperidone and hasn't had improvement in TD. Went down on the dose after our last appt so now on lower dose for ~2 weeks. Briefly reviewed past med trials again. Has had SE or other reactions to antipsychotic medications in the past. Given his medication history as well as his diabetes we discussed trial of Latuda given lower risk for weight gain in this medication. Discussed risks/benefits including metabolic effects, involuntary movements. Pt was amenable to trial of Latuda.  Also discussed need to take w/ a substantial (~400 kcal) meal. Unclear if insurance will cover this medication in a way that is affordable for him, but it does appear that GoodRx can be used at certain pharmacies to get the medication for ~30 dollars. Given liver dz, we will start a lower dose (20 mg) and plan for lower target dose. Will stop risperidone. He is aware of need for updated labs. Planning for in-person visit in ~3 weeks so we can assess abnormal movements. No other Q's or concerns today.

## 2022-06-16 MED ORDER — LITHIUM CARBONATE ER 450 MG TABLET,EXTENDED RELEASE
ORAL_TABLET | 2 refills | 0 days | Status: CP
Start: 2022-06-16 — End: ?

## 2022-06-19 ENCOUNTER — Ambulatory Visit
Admit: 2022-06-19 | Discharge: 2022-06-20 | Payer: MEDICARE | Attending: Student in an Organized Health Care Education/Training Program | Primary: Student in an Organized Health Care Education/Training Program

## 2022-06-19 DIAGNOSIS — Z79899 Other long term (current) drug therapy: Principal | ICD-10-CM

## 2022-06-19 DIAGNOSIS — F3130 Bipolar disorder, current episode depressed, mild or moderate severity, unspecified: Principal | ICD-10-CM

## 2022-06-19 DIAGNOSIS — G2401 Drug induced subacute dyskinesia: Principal | ICD-10-CM

## 2022-06-19 DIAGNOSIS — B181 Chronic viral hepatitis B without delta-agent: Principal | ICD-10-CM

## 2022-06-19 MED ORDER — TENOFOVIR DISOPROXIL FUMARATE 300 MG TABLET
ORAL_TABLET | Freq: Every day | ORAL | 0 refills | 30 days
Start: 2022-06-19 — End: 2022-07-19

## 2022-06-19 NOTE — Unmapped (Signed)
[facility]  Psychiatry   Established Patient E&M Service - Outpatient       Assessment:    Hawthorne Barany presents for follow-up evaluation. Today, Mr. Kipps reports stable symptoms of depression. Still feeling low (though not profoundly depressed) w/ low motivation. Patient and his husband both feel this is largely situational. He does not notice significant depression symptoms if his husband is home or they are traveling together (gets much more physical activity, socialization, and stimulation during those times). He has started Jordan and has experienced GI distress and sleep disturbance. Has been off risperidone since 05/31/22. Noticing some improvement in TD, but still having significant symptoms. Has noticed tongue and mouth movements and facial twitching. Also has frequent vocalizations, which his husband has noticed. He does feel like he had some symptoms of TD prior to starting risperidone, but they were not very noticeable/bothersome. Symptoms have not returned to previous baseline since stopping risperidone. He reports he has had more dry mouth and wonders if increased fluid intake could affect lithium level. Planning to obtain lithium level this week. Given ongoing TD, discussed starting B6. Additionally, given poor tolerability w/ latuda need to switch to another antipsychotic medication. See below for details on next steps (will depend on what is affordable w/ insurance coverage, which Mr. Roel is going to investigate). Will plan to touch base in the next few days to finalize plan. Will follow-up in ~2 weeks.    Identifying Information:  Kristie Gonzalo is a 67 y.o. male with a history of  Bipolar I Disorder that was diagnosed in 2003. He established with this clinic in September 2022. At that time, he was prescribed vraylar, lithium, doxepin, and xanax with fairly good efficacy and tolerability.     Risk Assessment:  A full psychiatric risk assessment was conducted on 01/31/21 and risks do not appear significantly changed from that visit.   While future psychiatric events cannot be accurately predicted, the patient does not currently require acute inpatient psychiatric care and does not currently meet [facility] involuntary commitment criteria.     Plan:    #Bipolar I Disorder:  --Continue Lithium ER 450 mg QAM, 900 mg at bedtime. Level of 0.8 in January 2023.  --Vraylar discontinued in July 2023 w/o any worsening sx.  --STOP Latuda d/t concern for GI upset w/ worsened diarrhea + nausea (on 20 mg since ~1/25)  --Pt to call medicare representative to see if there is any way to get Vraylar in a way that is affordable given poor tolerability with multiple other medications and past efficacy/tolerability w/ Vraylar.  --If Leafy Kindle is not an option would plan to trial rexulti.   --If rexulti is not an option would plan to trial olanzapine (though less preferred given diabetes, hx of significant wt gain since starting on antipsychotic medication)  --Recommend taking 400 mg of B6 for TD  --Continue hydroxyzine 25 mg BID PRN.   --Continue Effexor to 225 mg (s 4/11,i5/12, I5/22, i11/6).  --Previously discussed behavioral activation. Have previously discussed getting more plugged in with local community (maybe a church).  --Recommend discussing PT with PCP to help w/ his unsteadiness on feet/hoping this could increase activity level.  --Consider therapy, previously referred to resident CBT but patient never scheduled.     #Medication monitoring:   --Recent Cr of 0.8 per careeverywhere records  --Discussed need for updated labs, ordered last month and reminded today.  --AIMS on 2/12 of 2 (will scan to media). Scored for movements  in face, mouth, jaw, tongue. Had significant twitching in upper face, tongue movements (though not observed w/ opening but apparent throughout interview and with distraction), puckering. Patient also made frequent vocalizations which he reported no awareness of.  Lab Results Component Value Date    Cholesterol 136 06/06/2021    LDL Calculated 67 06/06/2021    HDL 33 (L) 06/06/2021    Triglycerides 181 (H) 06/06/2021     Hemoglobin A1C:   Lab Results   Component Value Date    Hemoglobin A1C 6.8 01/12/2021     Lithium Lvl   Date Value Ref Range Status   06/06/2021 0.8 0.5 - 1.2 mmol/L Final     TSH   Date Value Ref Range Status   06/06/2021 3.535 0.550 - 4.780 uIU/mL Final       Psychotherapy provided:  No billable psychotherapy service provided but brief supportive therapy was utilized.    Patient has been given this writer's contact information as well as the [facility] urgent line number. The patient has been instructed to call 911 for emergencies.    Subjective:    Interval History: Pt reports that Latuda is interfering w/ sleep. He has also had worsened diarrhea and nausea. He is taking w/ food. His husband feels like TD is better than when he was on Jordan. His husband feels like his facial twitching is better. He feels like lip and tongue movements have persisted. Patient and his husband think there may have before starting risperidone.     His husband reports that he is staying in bed a lot.     Depression is the same as before. His motivation has been low.     His husband reports that he feels better and does more when he is home.     They are going to [facility] for 6 weeks. Think that will be helpful for him.    Jebediah drinks 128 fl oz. He has frequent dry mouth. He has increased fluid intake over time.     His husband has noticed that he has been making noises/grunting frequently. He will sometimes point it out to [place] and he seesm to be able to suppress it somewhat. Sayon is not aware of the sounds.     Reviewed medication trials again today.     [name] reports that Khadar goes into a cycle every 6-7 years. States that he has mania for 12 months. He then will be depressed for 18 months. States symptoms were better controlled in [employer]. States he wasn't sleeping well during that time. States that he will spend money, be more outgoing, not sleeping. He has been medicated for the last 14 years. Thinks last mania episode was 3-4 years ago.     Will call medicare rep tomorrow.    Discussed POC (see assessment above for details). No other q's or concerns.    Objective:    Mental Status Exam:  Appearance:    Appears stated age, Well nourished, Well developed and Clean/Neat   Motor:   Facial twitching, more pronounced in forehead. Stuck tongue out for me and no abnormal movements observed. No other abnormal movements noted on interview.   Speech/Language:    Normal rate, volume, tone, fluency and Language intact, well formed   Mood:   The same   Affect:   Constricted   Thought process and Associations:   Logical, linear, clear, coherent, goal directed   Abnormal/psychotic thought content:    Able to enjoy things  at times Not endorsing SI. Denies significant anxiety. Remains future oriented. Not voicing any bizarre or delusional thought content on interview.   Perceptual disturbances:      He is not endorsing any AVH. No RIS on interview.     Other:        PHQ-9 PHQ-9 TOTAL SCORE   11/08/2021   6:18 PM 14   09/15/2021   8:40 PM 17     I personally spent 75 minutes face-to-face and non-face-to-face in the care of this patient, which includes all pre, intra, and post visit time on the date of service.  All documented time was specific to the E/M visit and does not include any procedures that may have been performed.        Dennison Bulla, MD

## 2022-06-20 NOTE — Unmapped (Signed)
Follow-up instructions:  --STOP Latuda  --Call medicare representative to discuss whether it is possible to get Vraylar affordably. Please call or message me after that to finalize the plan and discuss other options if Leafy Kindle is not going to be possible.  -- Please continue taking your medications as prescribed for your mental health.   -- Do not make changes to your medications, including taking more or less than prescribed, unless under the supervision of your physician. Be aware that some medications may make you feel worse if abruptly stopped  -- Please refrain from using illicit substances, as these can affect your mood and could cause anxiety or other concerning symptoms.   -- Seek further medical care for any increase in symptoms or new symptoms such as thoughts of wanting to hurt yourself or hurt others.     Contact info:  Life-threatening emergencies: call 911 or go to the nearest ER for medical or psychiatric attention.     Issues that need urgent attention but are not life threatening: call the clinic outpatient frontdesk at [phone] for assistance.     Non-urgent routine concerns, questions, and refill requests: please send me a mychart message and I will get back to you within 2 business days. If you prefer to call, please call the front desk at [phone].    Regarding appointments:  - If you need to cancel your appointment, we ask that you call [phone] at least 24 hours before your scheduled appointment.  - If for any reason you arrive 15 minutes later than your scheduled appointment time, you may not be seen and your visit may be rescheduled.  - Please remember that we will not automatically reschedule missed appointments.  - If you miss two (2) appointments without letting us know in advance, you will likely be referred to a provider in your community.  - We will do our best to be on time. Sometimes an emergency will arise that might cause your clinician to be late. We will try to inform you of this when you check in for your appointment. If you wait more than 15 minutes past your appointment time without such notice, please speak with the front desk staff.    In the event of bad weather, the clinic staff will attempt to contact you, should your appointment need to be rescheduled. Additionally, you can call the Patient Weather Line [phone] for system-wide clinic status    For more information and reminders regarding clinic policies (these were provided when you were admitted to the clinic), please ask the front desk.

## 2022-06-21 ENCOUNTER — Ambulatory Visit: Admit: 2022-06-21 | Discharge: 2022-06-22 | Payer: MEDICARE

## 2022-06-21 DIAGNOSIS — Z79899 Other long term (current) drug therapy: Principal | ICD-10-CM

## 2022-06-21 LAB — LIPID PANEL
CHOLESTEROL/HDL RATIO SCREEN: 4 (ref 1.0–4.5)
CHOLESTEROL: 133 mg/dL (ref <=200)
HDL CHOLESTEROL: 33 mg/dL — ABNORMAL LOW (ref 40–60)
LDL CHOLESTEROL CALCULATED: 82 mg/dL (ref 40–100)
NON-HDL CHOLESTEROL: 100 mg/dL (ref 70–130)
TRIGLYCERIDES: 88 mg/dL (ref 0–150)
VLDL CHOLESTEROL CAL: 17.6 mg/dL (ref 12–42)

## 2022-06-21 LAB — BASIC METABOLIC PANEL
ANION GAP: 9 mmol/L (ref 3–11)
BLOOD UREA NITROGEN: 11 mg/dL (ref 8–20)
BUN / CREAT RATIO: 11
CALCIUM: 9.5 mg/dL (ref 8.5–10.1)
CHLORIDE: 110 mmol/L — ABNORMAL HIGH (ref 98–107)
CO2: 25.6 mmol/L (ref 21.0–32.0)
CREATININE: 0.96 mg/dL (ref 0.80–1.30)
EGFR CKD-EPI (2021) MALE: 87 mL/min/{1.73_m2} (ref >=60)
GLUCOSE RANDOM: 157 mg/dL (ref 70–179)
POTASSIUM: 4.3 mmol/L (ref 3.5–5.0)
SODIUM: 145 mmol/L (ref 135–145)

## 2022-06-21 LAB — LITHIUM LEVEL: LITHIUM LEVEL: 0.9 mmol/L (ref 0.5–1.2)

## 2022-06-21 LAB — TSH: THYROID STIMULATING HORMONE: 2.489 u[IU]/mL (ref 0.550–4.780)

## 2022-06-22 NOTE — Unmapped (Signed)
[facility] Refill Coordination Note    Nicholas [name], DOB: 09-08-1955  Phone: [phone] (home) [phone] (work)      All above HIPAA information was verified with patient.         06/21/2022     3:05 PM   Specialty Rx Medication Refill Questionnaire   Which Medications would you like refilled and shipped? VIREAD, 10 ON HAND   Please list all current allergies: seroquel   Have you missed any doses in the last 30 days? No   Have you had any changes to your medication(s) since your last refill? No   How many days remaining of each medication do you have at home? 10   Have you experienced any side effects in the last 30 days? No   Please enter the full address (street address, city, state, zip code) where you would like your medication(s) to be delivered to. [address] [place], [place] [address]   Please specify on which day you would like your medication(s) to arrive. Note: if you need your medication(s) within 3 days, please call the pharmacy to schedule your order at [phone]  07/04/2022   Has your insurance changed since your last refill? No   Would you like a pharmacist to call you to discuss your medication(s)? No   Do you require a signature for your package? (Note: if we are billing Medicare Part B or your order contains a controlled substance, we will require a signature) No         Completed refill call assessment today to schedule patient's medication shipment from the [facility] [phone]).  All relevant notes have been reviewed.       Confirmed patient received a Conservation officer, historic buildings and a Surveyor, mining with first shipment. The patient will receive a drug information handout for each medication shipped and additional FDA Medication Guides as required.         REFERRAL TO PHARMACIST     Referral to the pharmacist: Not needed      [facility]     Shipping address confirmed in Epic.     Delivery Scheduled: Yes, Expected medication delivery date: 07/04/22.     Medication will be delivered via UPS to the prescription address in Epic WAM.    Arnold Long, PharmD   [facility] Specialty Pharmacist

## 2022-06-23 NOTE — Unmapped (Signed)
Nicholas [name] called to update delivery date to February 23,2024

## 2022-06-25 MED ORDER — TENOFOVIR DISOPROXIL FUMARATE 300 MG TABLET
ORAL_TABLET | Freq: Every day | ORAL | 0 refills | 30 days
Start: 2022-06-25 — End: 2022-07-25

## 2022-06-26 MED ORDER — OLANZAPINE 5 MG TABLET
ORAL_TABLET | Freq: Every evening | ORAL | 0 refills | 30 days | Status: CP
Start: 2022-06-26 — End: 2022-07-26

## 2022-06-26 NOTE — Unmapped (Signed)
Nicholas [name] was approved     Viread was temporary only

## 2022-06-28 NOTE — Unmapped (Incomplete)
**INCOMPLETE. WAITING FOR PATIENT TO CALL BACK**  [name]   Patient Onboarding/Medication Counseling    Nicholas [name] is a 67 y.o. male with Hepatitis B who I am counseling today on  re-initiation  of therapy.  I am speaking to the patient.    Was a Nurse, learning disability used for this call? No    Verified patient's date of birth / HIPAA.    Specialty medication(s) to be sent: Infectious Disease: Vemlidy      Non-specialty medications/supplies to be sent: n/a      Medications not needed at this time: n/a         Vemlidy (tenofovir alafenamide) 25mg     Medication & Administration     Dosage: Take one tablet by mouth daily    Administration: Take with food    Adherence/Missed dose instructions: take missed dose as soon as you remember. If it is close to the time of your next dose, skip the dose and resume with your next scheduled dose. Avoid missing doses as it can result in development of resistance.    Goals of Therapy     To keep HBV DNA levels undetectable  To decrease the morbidity and mortality related to chronic hepatitis B.    Side Effects & Monitoring Parameters     Headache  Abdominal pain.  Fatigue  Cough.   Upset stomach.   Diarrhea.    The following side effects should be reported to the provider:    Signs of an allergic reaction, like rash; hives; itching; red, swollen, blistered, or peeling skin with or without fever; wheezing; tightness in the chest or throat; trouble breathing, swallowing, or talking; unusual hoarseness; or swelling of the mouth, face, lips, tongue, or throat.   Signs of kidney problems like unable to pass urine, change in how much urine is passed, blood in the urine, or a big weight gain.  Signs of liver problems like dark urine, feeling tired, not hungry, upset stomach or stomach pain, light-colored stools, throwing up, or yellow skin or eyes.  Signs of too much lactic acid in the blood (lactic acidosis) like fast breathing, fast heartbeat, a heartbeat that does not feel normal, very bad upset stomach or throwing up, feeling very sleepy, shortness of breath, feeling very tired or weak, very bad dizziness, feeling cold, or muscle pain or cramps    Monitoring Parameters:     HBV e antigen, HBV e antibody, and HBV DNA: every 3-6 months or as clinically indicated  HBV surface antigen, HBV surface Antibody: as clinically indicated   INR: every 3 to 6 months as clinically indicated  Serum creatinine, serum phosphorus, urine glucose, and urine protein prior to initiation of therapy and as clinically indicated during therapy.  Perform HIV testing prior to initiation.   Obtain liver function tests (AST, ALT, Alk Phos, albumin, and bilirubin) as clinically indicated    Contraindications, Warnings, & Precautions     Lactic acidosis/hepatomegaly: Lactic acidosis and severe hepatomegaly with steatosis, sometimes fatal, have been reported with the use of nucleoside analogs, alone or in combination with other antiretrovirals. Suspend treatment in any patient who develops clinical or laboratory findings suggestive of lactic acidosis or pronounced hepatotoxicity (marked transaminase elevation may/may not accompany hepatomegaly and steatosis).  Hepatic impairment: Use is not recommended in patients with Child-Pugh class B or C hepatic impairment.  Hepatitis B acute exacerbation: [US Boxed Warning]: Discontinuation of anti-hepatitis B therapy may result in severe acute exacerbations of hepatitis B. Monitor clinical  and laboratory data closely for several months after treatment discontinuation. If clinically indicated, anti-hepatitis B therapy may be resumed.  HIV-1 and HBV coinfection: Should not be used as a single agent for the treatment of HIV-1 due to resistance development risk.  Renal impairment: Use is not recommended in patients with CrCl <15 mL/minute who are not receiving hemodialysis.  Renal toxicity: Cases of acute renal failure and/or Fanconi syndrome have been reported with use of tenofovir prodrugs; patients with preexisting renal impairment and those taking nephrotoxic agents (including NSAIDs) are at increased risk. Prior to initiation of therapy and during therapy, assess serum creatinine, estimated CrCl, urine protein, and urine glucose in all patients as clinically appropriate; also assess serum phosphorus in patients with chronic kidney disease. Discontinue therapy in patients that develop clinically significant decreases in renal function or evidence of Fanconi syndrome.    Drug/Food Interactions     Medication list reviewed in Epic. The patient was instructed to inform the care team before taking any new medications or supplements. No drug interactions identified.      Storage, Handling Precautions, & Disposal     Store this medication at room temperature.  Store in the original container   Keep lid tightly closed.  Store in a dry place. Do not store in a bathroom.  Keep all drugs in a safe place. Keep all drugs out of the reach of children and pets.  Throw away unused or expired drugs. Do not flush down a toilet or pour down a drain unless you are told to do so. Check with your pharmacist if you have questions about the best way to throw out drugs. There may be drug take-back programs in your area.      Current Medications (including OTC/herbals), Comorbidities and Allergies     Current Outpatient Medications   Medication Sig Dispense Refill    diphenoxylate-atropine (LOMOTIL) 2.5-0.025 mg per tablet Take 1 tablet by mouth four (4) times a day as needed for diarrhea.      glipiZIDE (GLUCOTROL XL) 5 MG 24 hr tablet Take 5 mg by mouth in the morning.      hydrOXYzine (ATARAX) 25 MG tablet Take 1 tablet (25 mg total) by mouth two (2) times a day as needed for anxiety. 60 tablet 0    lipase-protease-amylase (PANCREAZE) 37,000-97,300- 149,900 unit CpDR 3 caps with each meal and 2 with snacks      lithium (LITHOBID) 450 MG ER tablet TAKE 1 TABLET BY MOUTH EVERY MORNING AND 2 TABLETS EVERY EVENING 90 tablet 2    melatonin 10 mg cap Take 10 mg by mouth nightly.      nadoloL (CORGARD) 20 MG tablet Take 1 tablet (20 mg total) by mouth daily. 90 tablet 1    OLANZapine (ZYPREXA) 5 MG tablet Take 1 tablet (5 mg total) by mouth nightly. Take 1/2 (2.5 mg) nightly for 3 nights then increase to 5 mg nightly. 30 tablet 0    tenofovir alafenamide (VEMLIDY) 25 mg Tab tablet Take 1 tablet (25 mg total) by mouth daily. Take with food. 30 tablet 2    venlafaxine (EFFEXOR-XR) 75 MG 24 hr capsule Take 3 capsules (225 mg total) by mouth daily. 90 capsule 1     No current facility-administered medications for this visit.       Allergies   Allergen Reactions    Aripiprazole Other (See Comments)     Tardive Dyskinesia    Invega [Paliperidone] Other (See Comments)     Tardive  dyskinsia symptoms    Escitalopram Other (See Comments)     Doesn't Work    Hydrocodone Itching    Lamotrigine Rash    Quetiapine Rash       Patient Active Problem List   Diagnosis    HBV (hepatitis B virus) infection    Bipolar disorder, unspecified    Bipolar I disorder, most recent episode depressed (CMS-HCC)    Hepatic cirrhosis (CMS-HCC)    Gastroesophageal reflux disease    Hepatitis B virus infection    Hyperglycemia    History of colonic polyps       Reviewed and up to date in Epic.    Appropriateness of Therapy     Acute infections noted within Epic:  No active infections  Patient reported infection: {Blank single:19197::None,***- patient reported to provider,***- pharmacy reported to provider}    Is medication and dose appropriate based on diagnosis and infection status? {Blank single:19197::Yes,No - evidence provided by prescriber in *** note}    Prescription has been clinically reviewed: {Blank single:19197::Yes,***}      Baseline Quality of Life Assessment      {DiseaseSpecificQOL:73897}    Financial Information     Medication Assistance provided: None Required    Kennedy Bucker is not available for Vemlidy at this time    Anticipated copay of $100.00 reviewed with patient. Verified delivery address.    Delivery Information     Scheduled delivery date: ***    Expected start date: ***    Patient was notified of new phone menu: {Blank:19197::Yes,No}    Medication will be delivered via {Blank:19197::UPS,Next Day Courier,Same Day Courier,Clinic Courier - *** clinic,***} to the {Blank:19197::prescription,temporary} address in Epic WAM.  This shipment {Blank single:19197::will,will not} require a signature.      Explained the services we provide at [facility] and that each month we would call to set up refills.  Stressed importance of returning phone calls so that we could ensure they receive their medications in time each month.  Informed patient that we should be setting up refills 7-10 days prior to when they will run out of medication.  A pharmacist will reach out to perform a clinical assessment periodically.  Informed patient that a welcome packet, containing information about our pharmacy and other support services, a Notice of Privacy Practices, and a drug information handout will be sent.      The patient or caregiver noted above participated in the development of this care plan and knows that they can request review of or adjustments to the care plan at any time.      Patient or caregiver verbalized understanding of the above information as well as how to contact the pharmacy at [phone] option 4 with any questions/concerns.  The pharmacy is open Monday through Friday 8:30am-4:30pm.  A pharmacist is available 24/7 via pager to answer any clinical questions they may have.    Patient Specific Needs     Does the patient have any physical, cognitive, or cultural barriers? {Blank single:19197::No,Yes - ***}    Does the patient have adequate living arrangements? (i.e. the ability to store and take their medication appropriately) {Blank single:19197::Yes,No - ***}    Did you identify any home environmental safety or security hazards? {Blank single:19197::No,Yes - ***}    Patient prefers to have medications discussed with  {Blank single:19197::Patient,Family Member,Caregiver,Other}     Is the patient or caregiver able to read and understand education materials at a high school level or above? {Blank single:19197::No,Yes}  Patient's primary language is  {Blank single:19197::English,Spanish,***}     Is the patient high risk? {sschighriskpts:78327}    SOCIAL DETERMINANTS OF HEALTH     At the [facility], we have learned that life circumstances - like trouble affording food, housing, utilities, or transportation can affect the health of many of our patients.   That is why we wanted to ask: are you currently experiencing any life circumstances that are negatively impacting your health and/or quality of life? {YES/NO/PATIENTDECLINED:93004}    Social Determinants of Health     Financial Resource Strain: Not on file   Internet Connectivity: Not on file   Food Insecurity: Not on file   Tobacco Use: Medium Risk (03/02/2022)    Received from [facility]    Patient History     Smoking Tobacco Use: Former     Smokeless Tobacco Use: Never     Passive Exposure: Not on file   Housing/Utilities: Not on file   Alcohol Use: Not on file   Transportation Needs: Not on file   Substance Use: Not on file   Health Literacy: Not on file   Physical Activity: Not on file   Interpersonal Safety: Not on file   Stress: Not on file   Intimate Partner Violence: Not on file   Depression: Not at risk (03/18/2020)    Received from [facility]    PHQ-2     SDOH PHQ2 SCORE: 0   Social Connections: Not on file       Would you be willing to receive help with any of the needs that you have identified today? {Yes/No/Not applicable:93005}       Roderic Palau, PharmD  [facility] Specialty Pharmacist

## 2022-06-29 DIAGNOSIS — B181 Chronic viral hepatitis B without delta-agent: Principal | ICD-10-CM

## 2022-06-29 MED ORDER — VEMLIDY 25 MG TABLET
ORAL_TABLET | Freq: Every day | ORAL | 0 refills | 30 days | Status: CP
Start: 2022-06-29 — End: 2022-07-29
  Filled 2022-08-02: qty 30, 30d supply, fill #0
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
## 2022-07-06 ENCOUNTER — Telehealth
Admit: 2022-07-06 | Discharge: 2022-07-07 | Payer: MEDICARE | Attending: Student in an Organized Health Care Education/Training Program | Primary: Student in an Organized Health Care Education/Training Program

## 2022-07-06 DIAGNOSIS — G2401 Drug induced subacute dyskinesia: Principal | ICD-10-CM

## 2022-07-06 DIAGNOSIS — Z79899 Other long term (current) drug therapy: Principal | ICD-10-CM

## 2022-07-06 DIAGNOSIS — F3130 Bipolar disorder, current episode depressed, mild or moderate severity, unspecified: Principal | ICD-10-CM

## 2022-07-06 MED ORDER — OLANZAPINE 5 MG TABLET
ORAL_TABLET | Freq: Every evening | ORAL | 0 refills | 30 days | Status: CP
Start: 2022-07-06 — End: 2022-08-05

## 2022-07-06 MED ORDER — VENLAFAXINE ER 75 MG CAPSULE,EXTENDED RELEASE 24 HR
ORAL_CAPSULE | Freq: Every day | ORAL | 1 refills | 30 days | Status: CP
Start: 2022-07-06 — End: 2022-09-04

## 2022-07-06 NOTE — Unmapped (Signed)
[facility]  Psychiatry   Established Patient E&M Service - Outpatient       Assessment:    Nicholas [name] presents for follow-up evaluation. Today, Nicholas [name] is reporting some improvement in mood and sleep. He and his husband are staying in [facility] right now and he tends to do better w/ a change of scenery. Notes he has been a little more physically active recently. He is tolerating Zyprexa fairly well thus far. He has had some improvement in TD symptoms. Initially during our video visit TD appeared to be much improved though as our visit continued there were more abnormal movements noted including vocalizations. He does feel the movements are significantly improved. Notes they were previously impacting sleep, which is no longer occurring. He hasn't started on B6 yet. At this time, given stability and improvement in TD we will plan to continue medications as currently prescribed. Encouraged to start B6. Discussed that there may be a way to get Vraylar affordably. We discussed some potential benefits of that medication over Zyprexa and have provided information on patient assistance program. We will follow-up in 1 month.      Identifying Information:  Nicholas [name] is a 67 y.o. male with a history of  Bipolar I Disorder that was diagnosed in 2003. He established with this clinic in September 2022. At that time, he was prescribed vraylar, lithium, doxepin, and xanax with fairly good efficacy and tolerability.     Risk Assessment:  A full psychiatric risk assessment was conducted on 01/31/21 and risks do not appear significantly changed from that visit.   While future psychiatric events cannot be accurately predicted, the patient does not currently require acute inpatient psychiatric care and does not currently meet [facility] involuntary commitment criteria.     Plan:    #Bipolar I Disorder:  --Continue Lithium ER 450 mg QAM, 900 mg at bedtime. Level of 0.9 in February 2024.  -- Continue Zyprexa 5 mg nightly  --Recommend taking 400 mg of B6 for TD  --Continue hydroxyzine 25 mg BID PRN.   --Continue Effexor to 225 mg (s 4/11,i5/12, I5/22, i11/6).  --Previously discussed behavioral activation. Have previously discussed getting more plugged in with local community (maybe a church).  --Recommend discussing PT with PCP to help w/ his unsteadiness on feet/hoping this could increase activity level.  --Consider therapy, previously referred to resident CBT but patient never scheduled.     #Medication monitoring:   --Recent Cr of 0.8 per careeverywhere records  --Due for metabolic monitoring in February 2025  --Due for lithium labs in July 2024 (planning to monitor Q6 months given age)  --AIMS on 2/12 of 2 (scanned to media). Scored for movements in face, mouth, jaw, tongue. Had significant twitching in upper face, tongue movements (though not observed w/ opening but apparent throughout interview and with distraction), puckering. Patient also made frequent vocalizations which he reported no awareness of.  Lab Results   Component Value Date    Cholesterol 133 06/21/2022    LDL Calculated 82 06/21/2022    HDL 33 (L) 06/21/2022    Triglycerides 88 06/21/2022     Hemoglobin A1C:   Lab Results   Component Value Date    Hemoglobin A1C 6.8 01/12/2021     Lithium Lvl   Date Value Ref Range Status   06/21/2022 0.9 0.5 - 1.2 mmol/L Final     TSH   Date Value Ref Range Status   06/21/2022 2.489 0.550 - 4.780 uIU/mL Final  Psychotherapy provided:  No billable psychotherapy service provided but brief supportive therapy was utilized.    Patient has been given this writer's contact information as well as the [facility] urgent line number. The patient has been instructed to call 911 for emergencies.    Subjective:    Interval History:   Pt reports that he is feeling a little better recently. He is in [facility] w/ his husband. States that the new medication is helping him sleep a little better. He hasn't had SE. He reports that abnormal movements are a little better, but not substantially better. He hasn't started B6 yet. Abnormal movements aren't disturbing his sleep anymore. He states he feels like he can stop them if he is consciously thinking about them. He is still having some vocalizations at times.    He will be in [facility] for 6 weeks. He hasn't done too much in [facility] yet. He doesn't have a car with him. He states that he has been walking up the stairs.    States that he is using the computer more and staying a bit more active.     Reviewed medications. Denies any medication SE.     Discussed POC (see assessment above for details). No other Q's or concerns today.    Objective:    Mental Status Exam:  Appearance:    Appears stated age, Well nourished, Well developed and Clean/Neat   Motor:   Facial twitching, less in forehead than at prior visit.  Mouth opening repeatedly. Involuntary vocalizations. No other abnormal movements noted on interview.   Speech/Language:    Normal rate, volume, tone, fluency and Language intact, well formed   Mood:   A little better   Affect:   Constricted   Thought process and Associations:   Logical, linear, clear, coherent, goal directed   Abnormal/psychotic thought content:    Some improvement in mood. Not endorsing SI.  Remains future oriented. Not voicing any bizarre or delusional thought content on interview.   Perceptual disturbances:      He is not endorsing any AVH. No RIS on interview.     Other:        PHQ-9 PHQ-9 TOTAL SCORE   11/08/2021   6:18 PM 14   09/15/2021   8:40 PM 17         The patient reports they are physically located in [place] and is currently: not at home. I conducted a audio/video visit. I spent  84m 38s on the video call with the patient. I spent an additional 10 minutes on pre- and post-visit activities on the date of service .           Dennison Bulla, MD

## 2022-07-07 NOTE — Unmapped (Signed)
Follow-up instructions:  --START Vitamin B6 400 mg daily (this can help with TD)  -- Please continue taking your medications as prescribed for your mental health.   -- Do not make changes to your medications, including taking more or less than prescribed, unless under the supervision of your physician. Be aware that some medications may make you feel worse if abruptly stopped  -- Please refrain from using illicit substances, as these can affect your mood and could cause anxiety or other concerning symptoms.   -- Seek further medical care for any increase in symptoms or new symptoms such as thoughts of wanting to hurt yourself or hurt others.     Contact info:  Life-threatening emergencies: call 911 or go to the nearest ER for medical or psychiatric attention.     Issues that need urgent attention but are not life threatening: call the clinic outpatient frontdesk at [phone] for assistance.     Non-urgent routine concerns, questions, and refill requests: please send me a mychart message and I will get back to you within 2 business days. If you prefer to call, please call the front desk at [phone].    Regarding appointments:  - If you need to cancel your appointment, we ask that you call [phone] at least 24 hours before your scheduled appointment.  - If for any reason you arrive 15 minutes later than your scheduled appointment time, you may not be seen and your visit may be rescheduled.  - Please remember that we will not automatically reschedule missed appointments.  - If you miss two (2) appointments without letting us know in advance, you will likely be referred to a provider in your community.  - We will do our best to be on time. Sometimes an emergency will arise that might cause your clinician to be late. We will try to inform you of this when you check in for your appointment. If you wait more than 15 minutes past your appointment time without such notice, please speak with the front desk staff.    In the event of bad weather, the clinic staff will attempt to contact you, should your appointment need to be rescheduled. Additionally, you can call the Patient Weather Line [phone] for system-wide clinic status    For more information and reminders regarding clinic policies (these were provided when you were admitted to the clinic), please ask the front desk.

## 2022-07-18 NOTE — Unmapped (Signed)
[facility] Refill Coordination Note    Specialty Medication(s) to be Shipped:   Infectious Disease: Vemlidy    Other medication(s) to be shipped: No additional medications requested for fill at this time     Nicholas [name], DOB: Aug 24, 1955  Phone: [phone] (home) [phone] (work)      All above HIPAA information was verified with patient.     Was a Nurse, learning disability used for this call? No    Completed refill call assessment today to schedule patient's medication shipment from the [facility] [phone]).  All relevant notes have been reviewed.     Specialty medication(s) and dose(s) confirmed: Regimen is correct and unchanged.   Changes to medications: Nicholas [name] reports no changes at this time.  Changes to insurance: No  New side effects reported not previously addressed with a pharmacist or physician: None reported  Questions for the pharmacist: No    Confirmed patient received a Conservation officer, historic buildings and a Surveyor, mining with first shipment. The patient will receive a drug information handout for each medication shipped and additional FDA Medication Guides as required.       DISEASE/MEDICATION-SPECIFIC INFORMATION        N/A    SPECIALTY MEDICATION ADHERENCE     Medication Adherence    Patient reported X missed doses in the last month: 0  Specialty Medication: Vemlidy              Were doses missed due to medication being on hold? No    Vemlidy 25 mg: 10 days of medicine on hand     REFERRAL TO PHARMACIST     Referral to the pharmacist: Not needed      [facility]     Shipping address confirmed in Epic.     Patient was notified of new phone menu : Yes    Delivery Scheduled: Yes, Expected medication delivery date: 07/21/22.     Medication will be delivered via UPS to the prescription address in Epic WAM.    Rollen Sox, [facility]   [facility] Specialty Pharmacist

## 2022-07-20 NOTE — Unmapped (Signed)
Holli Humbles 's [facility] shipment will be delayed as a result of the medication is too soon to refill until 3/15.     I have reached out to the patient  at [phone] and left a voicemail message.  We will wait for a call back from the patient to reschedule the delivery.  We have not confirmed the new delivery date.

## 2022-07-24 MED ORDER — OLANZAPINE 5 MG TABLET
ORAL_TABLET | Freq: Every evening | ORAL | 0 refills | 30 days
Start: 2022-07-24 — End: 2022-08-23

## 2022-07-25 MED ORDER — OLANZAPINE 5 MG TABLET
ORAL_TABLET | Freq: Every evening | ORAL | 0 refills | 30 days | Status: CP
Start: 2022-07-25 — End: 2022-08-24

## 2022-08-01 ENCOUNTER — Telehealth
Admit: 2022-08-01 | Discharge: 2022-08-02 | Payer: MEDICARE | Attending: Student in an Organized Health Care Education/Training Program | Primary: Student in an Organized Health Care Education/Training Program

## 2022-08-01 DIAGNOSIS — G2401 Drug induced subacute dyskinesia: Principal | ICD-10-CM

## 2022-08-01 DIAGNOSIS — F3130 Bipolar disorder, current episode depressed, mild or moderate severity, unspecified: Principal | ICD-10-CM

## 2022-08-01 DIAGNOSIS — Z79899 Other long term (current) drug therapy: Principal | ICD-10-CM

## 2022-08-01 MED ORDER — LITHIUM CARBONATE ER 450 MG TABLET,EXTENDED RELEASE
ORAL_TABLET | 2 refills | 0 days | Status: CP
Start: 2022-08-01 — End: ?

## 2022-08-01 MED ORDER — OLANZAPINE 5 MG TABLET
ORAL_TABLET | Freq: Every evening | ORAL | 0 refills | 30 days | Status: CP
Start: 2022-08-01 — End: 2022-08-31

## 2022-08-01 NOTE — Unmapped (Signed)
Follow-up instructions:  --DECREASE Venlafaxine (Effexor) to 150 mg daily  -- Please continue taking your medications as prescribed for your mental health.   -- Do not make changes to your medications, including taking more or less than prescribed, unless under the supervision of your physician. Be aware that some medications may make you feel worse if abruptly stopped  -- Please refrain from using illicit substances, as these can affect your mood and could cause anxiety or other concerning symptoms.   -- Seek further medical care for any increase in symptoms or new symptoms such as thoughts of wanting to hurt yourself or hurt others.     Contact info:  Life-threatening emergencies: call 911 or go to the nearest ER for medical or psychiatric attention.     Issues that need urgent attention but are not life threatening: call the clinic outpatient frontdesk at [phone] for assistance.     Non-urgent routine concerns, questions, and refill requests: please send me a mychart message and I will get back to you within 2 business days. If you prefer to call, please call the front desk at [phone].    Regarding appointments:  - If you need to cancel your appointment, we ask that you call [phone] at least 24 hours before your scheduled appointment.  - If for any reason you arrive 15 minutes later than your scheduled appointment time, you may not be seen and your visit may be rescheduled.  - Please remember that we will not automatically reschedule missed appointments.  - If you miss two (2) appointments without letting us know in advance, you will likely be referred to a provider in your community.  - We will do our best to be on time. Sometimes an emergency will arise that might cause your clinician to be late. We will try to inform you of this when you check in for your appointment. If you wait more than 15 minutes past your appointment time without such notice, please speak with the front desk staff.    In the event of bad weather, the clinic staff will attempt to contact you, should your appointment need to be rescheduled. Additionally, you can call the Patient Weather Line [phone] for system-wide clinic status    For more information and reminders regarding clinic policies (these were provided when you were admitted to the clinic), please ask the front desk.

## 2022-08-01 NOTE — Unmapped (Signed)
[facility]  Psychiatry   Established Patient E&M Service - Outpatient       Assessment:    Nicholas [name] presents for follow-up evaluation. Today, Nicholas [name] is fairly stable psychiatrically. Notes mood has been pretty good recently. He is taking meds and tolerating them well. He is reporting some mild improvement in TD since our last visit, but sx are ongoing. He is not noticing them himself, but his husband points sx out to him. He notes that if he focuses he can control those sx to some extent, but if he is not paying attention they occur frequently. On our interview, I observe frequent movements in upper face (twitching of forehead, some grimacing) as well as some movements of his mouth. Less vocalizations were apparent this visit though he reports his husband will continue to point those out sometimes. He has yet to start on B6. Planning to order it online. At this time, we discussed trialing a lower dose of effexor in hopes of improving TD. Though this would be a less common cause it does seem that TD presented following increase in effexor so would be curious to see if that is a major contributor to current symptoms. Nicholas [name] is also exploring potential of getting back on Vraylar (needs to look into patient assistance). This may also help to alleviate sx given he did not previously have any TD on Vraylar. Did discuss monitoring mood closely w/ reduction in Effexor. Will plan to follow-up in ~3 weeks.    Identifying Information:  Nicholas [name] is a 67 y.o. male with a history of  Bipolar I Disorder that was diagnosed in 2003. He established with this clinic in September 2022. At that time, he was prescribed vraylar, lithium, doxepin, and xanax with fairly good efficacy and tolerability.     Risk Assessment:  A full psychiatric risk assessment was conducted on 01/31/21 and risks do not appear significantly changed from that visit.   While future psychiatric events cannot be accurately predicted, the patient does not currently require acute inpatient psychiatric care and does not currently meet [facility] involuntary commitment criteria.     Plan:    #Bipolar I Disorder:  --Continue Lithium ER 450 mg QAM, 900 mg at bedtime. Level of 0.9 in February 2024.  -- Continue Zyprexa 5 mg nightly  --Recommend taking 400 mg of B6 for TD  --Continue hydroxyzine 25 mg BID PRN.   --DECREASE Effexor to 150 mg daily (s 4/11,i5/12, I5/22, i11/6, d 08/01/22).  --Previously discussed behavioral activation. Have previously discussed getting more plugged in with local community (maybe a church).  --Recommend discussing PT with PCP to help w/ his unsteadiness on feet/hoping this could increase activity level.  --Consider therapy, previously referred to resident CBT but patient never scheduled.     #Medication monitoring:   --Recent Cr of 0.8 per careeverywhere records  --Due for metabolic monitoring in February 2025  --Due for lithium labs in July 2024 (planning to monitor Q6 months given age)  --AIMS on 06/19/22 of 2 (scanned to media). Scored for movements in face, mouth, jaw, tongue. Had significant twitching in upper face, tongue movements (though not observed w/ opening but apparent throughout interview and with distraction), puckering. Patient also made frequent vocalizations which he reported no awareness of.    Lab Results   Component Value Date    Cholesterol 133 06/21/2022    LDL Calculated 82 06/21/2022    HDL 33 (L) 06/21/2022    Triglycerides 88 06/21/2022  Hemoglobin A1C:   Lab Results   Component Value Date    Hemoglobin A1C 6.8 01/12/2021     Lithium Lvl   Date Value Ref Range Status   06/21/2022 0.9 0.5 - 1.2 mmol/L Final     TSH   Date Value Ref Range Status   06/21/2022 2.489 0.550 - 4.780 uIU/mL Final       Psychotherapy provided:  No billable psychotherapy service provided but brief supportive therapy was utilized.    Patient has been given this writer's contact information as well as the [facility] urgent line number. The patient has been instructed to call 911 for emergencies.    Subjective:    Interval History:   Pt reports he is doing well. He states that he is in [facility]. He has his dogs w/ him. Enjoying his stay. States that he hasn't been able to look into [place] assistance yet. He would like to get back on Vraylar if he can.     Things going OK on Zyprexa.     He hasn't tried B6 for TD yet. He hasn't been able to find it. He is going to need to order it online.    TD is better than last time we met. States if I concentrate it's a lot better, but if i I don't Nicholas [name] says I make noises with my tongue. He doesn't notice movements very much. He is still making vocalizations at times. He states these sx are worse in the AM. His husband will notice the movements and point them out.    TD no longer disrupting sleep. Sleep is good. Mood has been good. States that his sleep has 4/10 (1 being the best). He states he has started taking melatonin again, which helps him fall asleep w/i half an hour. Not waking up as much overnight.     States that lately he has had problems w/ getting his meds ordered. He notes this month he had trouble getting his Vemlidy. Navigating getting his medications has been a stressor.    Discussed POC (see assessment above for details). No other Q's or concerns today.    Objective:    Mental Status Exam:  Appearance:    Appears stated age, Well nourished, Well developed and Clean/Neat   Motor:   Facial twitching, less in forehead than at prior visit.  Mouth opening repeatedly. No other abnormal movements noted on interview.   Speech/Language:    Normal rate, volume, tone, fluency and Language intact, well formed. Occasional vocalizations.   Mood:   Pretty good   Affect:   Constricted   Thought process and Associations:   Logical, linear, clear, coherent, goal directed   Abnormal/psychotic thought content:    Some improvement in mood. Not endorsing SI.  Remains future oriented. Not voicing any bizarre or delusional thought content on interview.   Perceptual disturbances:      He is not endorsing any AVH. No RIS on interview.     Other:        I personally spent 31 minutes face-to-face and non-face-to-face in the care of this patient, which includes all pre, intra, and post visit time on the date of service.  All documented time was specific to the E/M visit and does not include any procedures that may have been performed.      The patient reports they are physically located in [place] and is currently: not at home. I conducted a audio/video visit. I spent  48m 38s  on the video call with the patient. I spent an additional 15 minutes on pre- and post-visit activities on the date of service .           Dennison Bulla, MD

## 2022-08-16 ENCOUNTER — Other Ambulatory Visit: Payer: Self-pay | Admitting: "Endocrinology

## 2022-08-22 NOTE — Unmapped (Signed)
[facility] Clinical Assessment & Refill Coordination Note    Nicholas [name], DOB: 1956/03/08  Phone: [phone] (home) [phone] (work)    All above HIPAA information was verified with patient.     Was a Nurse, learning disability used for this call? No    Specialty Medication(s):   Infectious Disease: Vemlidy     Current Outpatient Medications   Medication Sig Dispense Refill    diphenoxylate-atropine (LOMOTIL) 2.5-0.025 mg per tablet Take 1 tablet by mouth four (4) times a day as needed for diarrhea.      glipiZIDE (GLUCOTROL XL) 5 MG 24 hr tablet Take 5 mg by mouth in the morning.      hydrOXYzine (ATARAX) 25 MG tablet Take 1 tablet (25 mg total) by mouth two (2) times a day as needed for anxiety. 60 tablet 0    lipase-protease-amylase (PANCREAZE) 37,000-97,300- 149,900 unit CpDR 3 caps with each meal and 2 with snacks      lithium (LITHOBID) 450 MG ER tablet TAKE 1 TABLET BY MOUTH EVERY MORNING AND 2 TABLETS EVERY EVENING 90 tablet 2    melatonin 10 mg cap Take 10 mg by mouth nightly.      nadoloL (CORGARD) 20 MG tablet Take 1 tablet (20 mg total) by mouth daily. 90 tablet 1    OLANZapine (ZYPREXA) 5 MG tablet Take 1 tablet (5 mg total) by mouth nightly. 30 tablet 0    tenofovir alafenamide (VEMLIDY) 25 mg Tab tablet Take 1 tablet (25 mg total) by mouth daily. Take with food. 30 tablet 2    venlafaxine (EFFEXOR-XR) 75 MG 24 hr capsule Take 3 capsules (225 mg total) by mouth daily. 90 capsule 1     No current facility-administered medications for this visit.        Changes to medications: Tomar reports no changes at this time.    Allergies   Allergen Reactions    Aripiprazole Other (See Comments)     Tardive Dyskinesia    Invega [Paliperidone] Other (See Comments)     Tardive dyskinsia symptoms    Escitalopram Other (See Comments)     Doesn't Work    Hydrocodone Itching    Lamotrigine Rash    Quetiapine Rash       Changes to allergies: No    SPECIALTY MEDICATION ADHERENCE     Vemlidy 25 mg: 10 days of medicine on hand       Medication Adherence    Patient reported X missed doses in the last month: 0  Specialty Medication: Vemlidy 25mg   Patient is on additional specialty medications: No  Any gaps in refill history greater than 2 weeks in the last 3 months: no  Demonstrates understanding of importance of adherence: yes  Informant: patient  Provider-estimated medication adherence level: good  Patient is at risk for Non-Adherence: No          Specialty medication(s) dose(s) confirmed: Regimen is correct and unchanged.     Are there any concerns with adherence? No    Adherence counseling provided? Not needed    CLINICAL MANAGEMENT AND INTERVENTION      Clinical Benefit Assessment:    Do you feel the medicine is effective or helping your condition? Yes      HEPATITIS B AND ASSOCIATED LABS:       Lab Results   Component Value Date/Time    HBVDNAQT Detected (A) 02/09/2022 02:35 PM    HBVDNAQT Not Detected 04/23/2018 11:31 AM    HBVDNAQT  Not Detected 08/22/2016 10:51 AM    HBVDNACP <10 (H) 02/09/2022 02:35 PM    HBVD10  02/09/2022 02:35 PM      Comment:      <1.00 log    HBEAG Negative 02/09/2022 02:35 PM    HBEAG Positive (A) 04/23/2018 11:31 AM    HBEAG Positive (A) 08/22/2016 10:51 AM    ALT 78 02/09/2022 02:35 PM    ALT 42 10/30/2019 11:15 AM    ALT 65 (H) 05/06/2019 01:33 PM    ALT 25 02/27/2019 04:04 PM    AST 43 (H) 02/09/2022 02:35 PM    AST 35 10/30/2019 11:15 AM    CREATININE 0.96 06/21/2022 09:42 AM    CREATININE 1.00 02/09/2022 02:35 PM    CREATININE 1.0 01/14/2020 09:14 PM    CREATININE 0.88 10/30/2019 11:15 AM    CREATININE 0.9 10/01/2019 10:00 AM    CREATININE 0.73 (L) 05/06/2019 01:33 PM    CREATININE 1.0 04/15/2019 10:26 AM    CREATININE 1.02 02/27/2019 04:04 PM    CREATININE 0.9 02/28/2018 12:38 PM    CREATININE 0.9 06/05/2014 10:09 AM       Clinical Benefit counseling provided? Labs from 02/09/22 show evidence of clinical benefit    Adverse Effects Assessment:    Are you experiencing any side effects? No    Are you experiencing difficulty administering your medicine? No    Quality of Life Assessment:      How many days over the past month did your Hepatitis B  keep you from your normal activities? For example, brushing your teeth or getting up in the morning. 0    Have you discussed this with your provider? Not needed    Acute Infection Status:    Acute infections noted within Epic:  No active infections  Patient reported infection: None    Therapy Appropriateness:    Is therapy appropriate and patient progressing towards therapeutic goals? Yes, therapy is appropriate and should be continued    DISEASE/MEDICATION-SPECIFIC INFORMATION      N/A    Hepatitis B: Not Applicable    PATIENT SPECIFIC NEEDS     Does the patient have any physical, cognitive, or cultural barriers? No    Is the patient high risk? No    Did the patient require a clinical intervention? No    Does the patient require physician intervention or other additional services (i.e., nutrition, smoking cessation, social work)? No    SOCIAL DETERMINANTS OF HEALTH     At the [facility], we have learned that life circumstances - like trouble affording food, housing, utilities, or transportation can affect the health of many of our patients.   That is why we wanted to ask: are you currently experiencing any life circumstances that are negatively impacting your health and/or quality of life? Patient declined to answer    Social Determinants of Health     Financial Resource Strain: Not on file   Internet Connectivity: Not on file   Food Insecurity: Not on file   Tobacco Use: Medium Risk (05/15/2022)    Received from [facility]    Patient History     Smoking Tobacco Use: Former     Smokeless Tobacco Use: Never     Passive Exposure: Not on file   Housing/Utilities: Not on file   Alcohol Use: Not on file   Transportation Needs: Not on file   Substance Use: Not on file   Health Literacy: Not on file   Physical  Activity: Not on file Interpersonal Safety: Not on file   Stress: Not on file   Intimate Partner Violence: Not on file   Depression: Not at risk (03/18/2020)    Received from [facility] visits prior to 07/08/2022.    PHQ-2     SDOH PHQ2 SCORE: 0   Social Connections: Not on file       Would you be willing to receive help with any of the needs that you have identified today? Not applicable       SHIPPING     Specialty Medication(s) to be Shipped:   Infectious Disease: Vemlidy    Other medication(s) to be shipped: No additional medications requested for fill at this time     Changes to insurance: No    Delivery Scheduled: Yes, Expected medication delivery date: 08/25/22.     Medication will be delivered via UPS to the confirmed prescription address in [facility].    The patient will receive a drug information handout for each medication shipped and additional FDA Medication Guides as required.  Verified that patient has previously received a Conservation officer, historic buildings and a Surveyor, mining.    The patient or caregiver noted above participated in the development of this care plan and knows that they can request review of or adjustments to the care plan at any time.      All of the patient's questions and concerns have been addressed.    Roderic Palau, PharmD   [facility] Specialty Pharmacist

## 2022-08-24 MED FILL — VEMLIDY 25 MG TABLET: ORAL | 30 days supply | Qty: 30 | Fill #1

## 2022-08-25 ENCOUNTER — Telehealth
Admit: 2022-08-25 | Discharge: 2022-08-26 | Payer: MEDICARE | Attending: Student in an Organized Health Care Education/Training Program | Primary: Student in an Organized Health Care Education/Training Program

## 2022-08-25 DIAGNOSIS — Z79899 Other long term (current) drug therapy: Principal | ICD-10-CM

## 2022-08-25 DIAGNOSIS — G2401 Drug induced subacute dyskinesia: Principal | ICD-10-CM

## 2022-08-25 DIAGNOSIS — F3130 Bipolar disorder, current episode depressed, mild or moderate severity, unspecified: Principal | ICD-10-CM

## 2022-08-25 NOTE — Unmapped (Signed)
Follow-up instructions:  -- Please continue taking your medications as prescribed for your mental health.   -- Do not make changes to your medications, including taking more or less than prescribed, unless under the supervision of your physician. Be aware that some medications may make you feel worse if abruptly stopped  -- Please refrain from using illicit substances, as these can affect your mood and could cause anxiety or other concerning symptoms.   -- Seek further medical care for any increase in symptoms or new symptoms such as thoughts of wanting to hurt yourself or hurt others.     Contact info:  Life-threatening emergencies: call 911 or go to the nearest ER for medical or psychiatric attention.     Issues that need urgent attention but are not life threatening: call the clinic outpatient frontdesk at [phone] for assistance.     Non-urgent routine concerns, questions, and refill requests: please send me a mychart message and I will get back to you within 2 business days. If you prefer to call, please call the front desk at [phone].    Regarding appointments:  - If you need to cancel your appointment, we ask that you call [phone] at least 24 hours before your scheduled appointment.  - If for any reason you arrive 15 minutes later than your scheduled appointment time, you may not be seen and your visit may be rescheduled.  - Please remember that we will not automatically reschedule missed appointments.  - If you miss two (2) appointments without letting us know in advance, you will likely be referred to a provider in your community.  - We will do our best to be on time. Sometimes an emergency will arise that might cause your clinician to be late. We will try to inform you of this when you check in for your appointment. If you wait more than 15 minutes past your appointment time without such notice, please speak with the front desk staff.    In the event of bad weather, the clinic staff will attempt to contact you, should your appointment need to be rescheduled. Additionally, you can call the Patient Weather Line [phone] for system-wide clinic status    For more information and reminders regarding clinic policies (these were provided when you were admitted to the clinic), please ask the front desk.

## 2022-08-25 NOTE — Unmapped (Signed)
[facility]  Psychiatry   Established Patient E&M Service - Outpatient       Assessment:    Nicholas [name] presents for follow-up evaluation. Today, Nicholas [name] is doing fairly well. Reporting his mood has been good lately. He is getting outside and moving a bit more than usual. Also notes he is getting up earlier in the AM. He is reporting ongoing improvement in TD. Husband still noticing grunting at times. I am observing some mild puckering, mouth movements and some twitching in the upper face. I did not observe any vocalizations during our visit. He just started on B6 (about 3 days ago). Movements are not particularly bothersome for him. He is interested in trying Vraylar again. We completed the paperwork for [facility] assistance as this medication is prohibitively expensive with his insurance plan. Wonder if this can alleviate TD given history of tolerating that medication w/o any abnormal movements. If approved would plan to trial lower dose of Vraylar (1.5). We discussed possibility of reducing effexor dose given this may also contribute to TD (particularly as movements arose following a dose increase). However, given his mood is significantly improved we are reticent to reduce the dose today. Did also discuss last lithium level being 0.9 and possibility of targeting a lower level as he ages. Will defer changing lithium dose for now given plans for possible antipsychotic change. Would plan to follow-up in ~4 weeks. Pt to let me know about scheduling an appt after talking with his husband.    Identifying Information:  Nicholas [name] is a 67 y.o. male with a history of  Bipolar I Disorder that was diagnosed in 2003. He established with this clinic in September 2022. At that time, he was prescribed vraylar, lithium, doxepin, and xanax with fairly good efficacy and tolerability.     Risk Assessment:  A full psychiatric risk assessment was conducted on 01/31/21 and risks do not appear significantly changed from that visit.   While future psychiatric events cannot be accurately predicted, the patient does not currently require acute inpatient psychiatric care and does not currently meet [facility] involuntary commitment criteria.     Plan:    #Bipolar I Disorder:  --Continue Lithium ER 450 mg QAM, 900 mg at bedtime. Level of 0.9 in February 2024. Could consider reducing dose to target lower level given pt's age.  -- Continue Zyprexa 5 mg nightly.   --Plan to transition back to Vraylar if approved for assistance.  --Recommend taking 400 mg of B6 for TD (started about 3 days ago)  --Continue hydroxyzine 25 mg BID PRN.   --Continue Effexor 225 mg daily (s 4/11,i5/12, I5/22, i11/6)  --Previously discussed behavioral activation. Have previously discussed getting more plugged in with local community (maybe a church).  --Recommend discussing PT with PCP to help w/ his unsteadiness on feet/hoping this could increase activity level.  --Consider therapy, previously referred to resident CBT but patient never scheduled.     #Medication monitoring:   --Recent Cr of 0.8 per careeverywhere records  --Due for metabolic monitoring in February 2025  --Due for lithium labs in July 2024 (planning to monitor Q6 months given age)  --AIMS on 06/19/22 of 2 (scanned to media). Scored for movements in face, mouth, jaw, tongue. Had significant twitching in upper face, tongue movements (though not observed w/ opening but apparent throughout interview and with distraction), puckering. Patient also made frequent vocalizations which he reported no awareness of.    Lab Results   Component Value  Date    Cholesterol 133 06/21/2022    LDL Calculated 82 06/21/2022    HDL 33 (L) 06/21/2022    Triglycerides 88 06/21/2022     Hemoglobin A1C:   Lab Results   Component Value Date    Hemoglobin A1C 6.8 01/12/2021     Lithium Lvl   Date Value Ref Range Status   06/21/2022 0.9 0.5 - 1.2 mmol/L Final     TSH   Date Value Ref Range Status   06/21/2022 2.489 0.550 - 4.780 uIU/mL Final       Psychotherapy provided:  No billable psychotherapy service provided but brief supportive therapy was utilized.    Patient has been given this writer's contact information as well as the [facility] Psychiatry urgent line number. The patient has been instructed to call [phone] for emergencies.    Subjective:    Interval History:    : States that not much has gone on since his last visit. 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 He feels like this would be stressful.     He has started taking B6. He started the B6 3 days ago. Feels like TD is getting better. [name] is noticing that he is grunting at times.    Mood has been pretty good. He is getting used to being back home. States he is spending time oustide. He is walking some.     Not feeling down/low lately.     States he is getting up earlier in the morning.     He is taking 3 capsules of 75 mg effexor. Hasn't changed the dose since we last met. Wanting to stay on this dose of effexor right now.     Discussed POC (see assessment above for details). No other q's or concerns today.    Objective:    Mental Status Exam:  Appearance:    Appears stated age, Well nourished, Well developed and Clean/Neat   Motor:   Some twitching in upper face, less pronounced than at prior visit. Some puckering, lip movements. No other abnormal movements noted on interview.   Speech/Language:    Normal rate, volume, tone, fluency and Language intact, well formed.    Mood:   Pretty good   Affect:   Euthymic   Thought process and Associations:   Logical, linear, clear, coherent, goal directed   Abnormal/psychotic thought content:    Some improvement in mood. Not endorsing SI.  Remains future oriented. Not voicing any bizarre or delusional thought content on interview.   Perceptual disturbances:      He is not endorsing any AVH. No RIS on interview.     Other:        I personally spent 41 minutes face-to-face and non-face-to-face in the care of this patient, which includes all pre, intra, and post visit time on the date of service.  All documented time was specific to the E/M visit and does not include any procedures that may have been performed.      The patient reports they are physically located in [place] and is currently: at home. I conducted a audio/video visit. I spent  29m 28s on the video call with the patient. I spent an additional 20 minutes on pre- and post-visit activities on the date of service .   --Completed [employer] paperwork today.      Dennison Bulla, MD

## 2022-08-27 MED ORDER — OLANZAPINE 5 MG TABLET
ORAL_TABLET | Freq: Every evening | ORAL | 0 refills | 30 days
Start: 2022-08-27 — End: 2022-09-26

## 2022-08-28 MED ORDER — OLANZAPINE 5 MG TABLET
ORAL_TABLET | Freq: Every evening | ORAL | 0 refills | 30 days | Status: CP
Start: 2022-08-28 — End: 2022-09-27

## 2022-09-05 ENCOUNTER — Ambulatory Visit: Payer: Medicare HMO | Admitting: "Endocrinology

## 2022-09-05 DIAGNOSIS — I8510 Secondary esophageal varices without bleeding: Principal | ICD-10-CM

## 2022-09-05 DIAGNOSIS — K766 Portal hypertension: Principal | ICD-10-CM

## 2022-09-05 DIAGNOSIS — B181 Chronic viral hepatitis B without delta-agent: Principal | ICD-10-CM

## 2022-09-05 DIAGNOSIS — K7469 Other cirrhosis of liver: Principal | ICD-10-CM

## 2022-09-05 MED ORDER — NADOLOL 20 MG TABLET
ORAL_TABLET | Freq: Every day | ORAL | 1 refills | 90 days | Status: CP
Start: 2022-09-05 — End: 2023-03-04

## 2022-09-05 NOTE — Unmapped (Signed)
A med refill request for Nadolol received.    Last ov:  07/14/2021 (telemed), no scheduled follow-up.  Scheduler will reach out to pt to schedule a follow-up visit.    Per Epic, last CBC with diff, PT-INR drawn 10/30/2019;  last Hepatic Function panel drawn 02/09/2022 and GGT on 12/04/2018.    Pended to provider for review and approval.

## 2022-09-12 MED ORDER — VENLAFAXINE ER 75 MG CAPSULE,EXTENDED RELEASE 24 HR
ORAL_CAPSULE | Freq: Every day | ORAL | 1 refills | 30 days | Status: CP
Start: 2022-09-12 — End: 2022-11-11

## 2022-09-18 NOTE — Unmapped (Signed)
[facility] Refill Coordination Note    Specialty Medication(s) to be Shipped:   Infectious Disease: Vemlidy    Other medication(s) to be shipped: No additional medications requested for fill at this time     Nicholas [name], DOB: December 05, 1955  Phone: [phone] (home) [phone] (work)      All above HIPAA information was verified with patient.     Was a Nurse, learning disability used for this call? No    Completed refill call assessment today to schedule patient's medication shipment from the [facility] [id number]).  All relevant notes have been reviewed.     Specialty medication(s) and dose(s) confirmed: Regimen is correct and unchanged.   Changes to medications: [place] reports no changes at this time.  Changes to insurance: No  New side effects reported not previously addressed with a pharmacist or physician: None reported  Questions for the pharmacist: No    Confirmed patient received a Conservation officer, historic buildings and a Surveyor, mining with first shipment. The patient will receive a drug information handout for each medication shipped and additional FDA Medication Guides as required.       DISEASE/MEDICATION-SPECIFIC INFORMATION        N/A    SPECIALTY MEDICATION ADHERENCE     Medication Adherence    Specialty Medication: VEMLIDY 25 mg Tab tablet (tenofovir alafenamide)              Were doses missed due to medication being on hold? No    Vemlidy 25 mg: 9 days of medicine on hand     REFERRAL TO PHARMACIST     Referral to the pharmacist: Not needed      [facility]     Shipping address confirmed in Epic.     Patient was notified of new phone menu : Yes    Delivery Scheduled: Yes, Expected medication delivery date: 09/22/22.     Medication will be delivered via UPS to the prescription address in Epic WAM.    [name]   [facility] Specialty Technician

## 2022-09-22 NOTE — Unmapped (Signed)
Nicholas [name] 's vemlidy shipment will be delayed as a result of a high copay.     I have reached out to the patient  at [phone] and left a voicemail message.  We will wait for a call back from the patient to reschedule the delivery.  We have not confirmed the new delivery date.

## 2022-09-25 DIAGNOSIS — B181 Chronic viral hepatitis B without delta-agent: Principal | ICD-10-CM

## 2022-09-25 MED ORDER — TENOFOVIR DISOPROXIL FUMARATE 300 MG TABLET
ORAL_TABLET | Freq: Every day | ORAL | 0 refills | 30 days | Status: CP
Start: 2022-09-25 — End: ?
  Filled 2022-09-26: qty 30, 30d supply, fill #0

## 2022-09-25 NOTE — Unmapped (Signed)
[facility]   Patient Onboarding/Medication Counseling    Nicholas [name] is a 67 y.o. male with Hepatitis B who I am counseling today on initiation of therapy.  I am speaking to the patient.    Was a Nurse, learning disability used for this call? No    Verified patient's date of birth / HIPAA.    Specialty medication(s) to be sent: Infectious Disease: tenofovir disoproxil fumarate      Non-specialty medications/supplies to be sent: n/a      Medications not needed at this time: n/a         Tenofovir disoproxil fumarate 300mg  tablet (Viread 300mg  tablet)    The patient declined counseling on medication administration, missed dose instructions, goals of therapy, side effects and monitoring parameters, warnings and precautions, drug/food interactions, and storage, handling precautions, and disposal because they have taken the medication previously. The information in the declined sections below are for informational purposes only and was not discussed with patient.     Medication & Administration     Dosage: Take one tablet by mouth once daily    Administration: Take with or without food    Dosing: Renal Impairment: Adult  CrCl 30 to 49 mL/minute: 300 mg every 48 hours  CrCl 10 to 29 mL/minute: 300 mg every 72 to 96 hours  CrCl <10 mL/minute: No dosage adjustments provided in the manufacturer's labeling (has not been studied).  Hemodialysis: 300 mg following dialysis every 7 days or after approximately 12 hours of dialysis (usually once weekly assuming 3 dialysis sessions lasting about 4 hours each).  Adherence/Missed dose instructions:   Take a missed dose as soon as you remember. If it is close to the time for your next dose, skip the missed dose and go back to your next scheduled dose.  Do not take 2 doses at the same time or extra doses.  Avoid missing doses as it can result in development of resistance.    Goals of Therapy     To keep HBV DNA levels undetectable.  To decrease the morbidity and mortality related to chronic hepatitis B.    Side Effects & Monitoring Parameters   HBV patients with compensated liver disease:   Nausea  HBV patients with decompensated liver disease:   Abdominal pain  Nausea  Insomnia  Pruritus  Vomiting  Dizziness  Fever     The following side effects should be reported to the provider:  Signs of an allergic reaction like rash, hives, itching, red, swollen, blistered, or peeling skin with or without fever. If you have wheezing, tightness in the chest or throat, trouble breathing, swallowing, or talking, unusual hoarseness, or swelling of the mouth, face, lips, tongue, or throat, call 911 or go to emergency department of the closest hospital.  Signs of kidney problems like unable to pass urine, change in how much urine is passed, blood in the urine, or a big weight gain.  Signs of liver problems like dark urine, weakness, loss of appetite for several days or longer, persistent or worsening upset stomach or stomach pain with nausea and vomiting, light-colored stools, or yellow skin or eyes.   Signs of too much lactic acid in the blood (lactic acidosis) like weakness or being more tired than usual, unusual muscle pain, being short of breath or fast breathing, stomach pain with nausea and vomiting, cold or blue hands and feet, feel dizzy or lightheaded, or a fast or abnormal heartbeat..  Persistent or worsening bone pain, pain in extremities,  fractures and/or muscular pain may be manifestations of proximal renal tubulopathy and should prompt an evaluation of renal function in patients at risk of renal dysfunction.    Monitoring Parameters:  HIV status prior to initiation of therapy  HBV e antigen, HBV e antibody, and HBV DNA: every 3-6 months or as clinically indicated  HBV surface antigen, HBV surface Antibody: as clinically indicated   Serum creatinine, estimated creatinine clearance, urine glucose, and urine protein prior to initiation of therapy and as clinically indicated during therapy. . Also assess serum phosphorus in chronic kidney disease.  Liver function tests (AST, ALT, Alk Phos, albumin, and bilirubin) and INR as clinically indicated  Bone density (patients with history of bone fracture or have risk factors for bone loss) as clinically indicated      Contraindications, Warnings, & Precautions     Lactic acidosis/hepatomegaly: Lactic acidosis and severe hepatomegaly with steatosis, sometimes fatal, have been reported with the use of nucleoside analogs, alone or in combination with other antiretrovirals. Suspend treatment in any patient who develops clinical or laboratory findings suggestive of lactic acidosis or pronounced hepatotoxicity (marked transaminase elevation may/may not accompany hepatomegaly and steatosis).  Hepatitis B acute exacerbation: [US Boxed Warning]: Discontinuation of anti-hepatitis B therapy may result in severe acute exacerbations of hepatitis B. Monitor clinical and laboratory data closely for several months after treatment discontinuation. If clinically indicated, anti-hepatitis B therapy may be resumed.  HIV-1 and HBV coinfection: Should not be used as a single agent for the treatment of HIV-1 due to resistance development risk.  Decreased bone mineral density: Consider assessment of BMD in patients with a history of pathologic fracture or other risk factors for osteoporosis or bone loss.  New onset or worsening renal impairment: Can include acute renal failure and Fanconi syndrome. Avoid administering tenofovir disoproxil fumarate with concurrent or recent use of nephrotoxic drugs.  Osteomalacia and renal dysfunction: May cause osteomalacia with proximal renal tubulopathy. Bone pain, extremity pain, fractures, arthralgias, weakness and muscle pain have been reported. In patients at risk for renal dysfunction, persistent or worsening bone or muscle symptoms should be evaluated..     Drug/Food Interactions     Medication list reviewed in Epic. The patient was instructed to inform the care team before taking any new medications or supplements. No drug interactions identified.   Drugs affecting renal function: Coadministration of tenofovir disoproxil fumarate with drugs that are eliminated by active tubular secretion may increase concentrations of tenofovir and/or the coadministered drug. Some examples include, but are not limited to, acyclovir, cidofovir, ganciclovir, valacyclovir, valganciclovir, aminoglycosides (e.g., gentamicin), and high-dose or multiple NSAIDs. Drugs that decrease renal function may increase concentrations of tenofovir.  HBV treatment: avoid combination with adefovir dipivoxil  HIV treatment: Coadministration of tenofovir disoproxil fumarate with certain HIV-1 protease inhibitors increases tenofovir concentrations. Monitor for evidence of tenofovir toxicity.  Tenofovir disoproxil fumarate increases didanosine concentrations.   Tenofovir disoproxil fumarate coadministration decreases atazanavir concentrations. When coadministered with tenofovir disoproxil fumarate, use atazanavir given with ritonavir.  HCV treatment: Coadministration of tenofovir disoproxil fumarate with certain HCV medications increase tenofovir concentrations. Monitor for evidence of tenofovir toxicity.  Ledipasvir/sofosbuvir  Sofosbuvur/velpatasvir    Storage, Handling Precautions, & Disposal     Store this medication at room temperature.  Store in the original container.   Keep lid tightly closed.  Store in a dry place. Do not store in a bathroom.  Keep all drugs in a safe place. Keep all drugs out of the reach of children and pets.  Throw away unused or expired drugs. Do not flush down a toilet or pour down a drain unless you are told to do so. Check with your pharmacist if you have questions about the best way to throw out drugs. There may be drug take-back programs in your area.      Current Medications (including OTC/herbals), Comorbidities and Allergies     Current Outpatient Medications Medication Sig Dispense Refill    diphenoxylate-atropine (LOMOTIL) 2.5-0.025 mg per tablet Take 1 tablet by mouth four (4) times a day as needed for diarrhea.      glipiZIDE (GLUCOTROL XL) 5 MG 24 hr tablet Take 5 mg by mouth in the morning.      hydrOXYzine (ATARAX) 25 MG tablet Take 1 tablet (25 mg total) by mouth two (2) times a day as needed for anxiety. 60 tablet 0    lipase-protease-amylase (PANCREAZE) 37,000-97,300- 149,900 unit CpDR 3 caps with each meal and 2 with snacks      lithium (LITHOBID) 450 MG ER tablet TAKE 1 TABLET BY MOUTH EVERY MORNING AND 2 TABLETS EVERY EVENING 90 tablet 2    melatonin 10 mg cap Take 10 mg by mouth nightly.      nadolol (CORGARD) 20 MG tablet Take 1 tablet (20 mg total) by mouth daily. 90 tablet 1    OLANZapine (ZYPREXA) 5 MG tablet Take 1 tablet (5 mg total) by mouth nightly. 30 tablet 0    tenofovir alafenamide (VEMLIDY) 25 mg Tab tablet Take 1 tablet (25 mg total) by mouth daily. Take with food. 30 tablet 2    tenofovir disoproxil (VIREAD) 300 mg tablet Take 1 tablet (300 mg total) by mouth daily. 30 tablet 0    venlafaxine (EFFEXOR-XR) 75 MG 24 hr capsule Take 3 capsules (225 mg total) by mouth daily. 90 capsule 1     No current facility-administered medications for this visit.       Allergies   Allergen Reactions    Aripiprazole Other (See Comments)     Tardive Dyskinesia    Invega [Paliperidone] Other (See Comments)     Tardive dyskinsia symptoms    Escitalopram Other (See Comments)     Doesn't Work    Hydrocodone Itching    Lamotrigine Rash    Quetiapine Rash       Patient Active Problem List   Diagnosis    HBV (hepatitis B virus) infection    Bipolar disorder, unspecified    Bipolar I disorder, most recent episode depressed (CMS-HCC)    Hepatic cirrhosis (CMS-HCC)    Gastroesophageal reflux disease    Hepatitis B virus infection    Hyperglycemia    History of colonic polyps       Reviewed and up to date in Epic.      HEPATITIS B AND ASSOCIATED LABS:       Lab Results Component Value Date/Time    HBVDNAQT Detected (A) 02/09/2022 02:35 PM    HBVDNAQT Not Detected 04/23/2018 11:31 AM    HBVDNAQT Not Detected 08/22/2016 10:51 AM    HBVDNACP <10 (H) 02/09/2022 02:35 PM    HBVD10  02/09/2022 02:35 PM      Comment:      <1.00 log    HBEAG Negative 02/09/2022 02:35 PM    HBEAG Positive (A) 04/23/2018 11:31 AM    HBEAG Positive (A) 08/22/2016 10:51 AM    ALT 78 02/09/2022 02:35 PM    ALT 42 10/30/2019 11:15 AM    ALT 65 (H) 05/06/2019 01:33  PM    ALT 25 02/27/2019 04:04 PM    AST 43 (H) 02/09/2022 02:35 PM    AST 35 10/30/2019 11:15 AM    CREATININE 0.96 06/21/2022 09:42 AM    CREATININE 1.00 02/09/2022 02:35 PM    CREATININE 1.0 01/14/2020 09:14 PM    CREATININE 0.88 10/30/2019 11:15 AM    CREATININE 0.9 10/01/2019 10:00 AM    CREATININE 0.73 (L) 05/06/2019 01:33 PM    CREATININE 1.0 04/15/2019 10:26 AM    CREATININE 1.02 02/27/2019 04:04 PM    CREATININE 0.9 02/28/2018 12:38 PM    CREATININE 0.9 06/05/2014 10:09 AM       Appropriateness of Therapy     Acute infections noted within Epic:  No active infections  Patient reported infection: None    Is medication and dose appropriate based on diagnosis and infection status? Yes    Prescription has been clinically reviewed: Yes      Baseline Quality of Life Assessment      How many days over the past month did your Hepatitsi B  keep you from your normal activities? For example, brushing your teeth or getting up in the morning. 0    Financial Information     Medication Assistance provided: None Required    Anticipated copay of $31.27 reviewed with patient. Verified delivery address.    Delivery Information     Scheduled delivery date: 09/27/22    Expected start date: 09/29/22      Medication will be delivered via UPS to the prescription address in [facility].  This shipment will not require a signature.      Explained the services we provide at [facility] and that each month we would call to set up refills.  Stressed importance of returning phone calls so that we could ensure they receive their medications in time each month.  Informed patient that we should be setting up refills 7-10 days prior to when they will run out of medication.  A pharmacist will reach out to perform a clinical assessment periodically.  Informed patient that a welcome packet, containing information about our pharmacy and other support services, a Notice of Privacy Practices, and a drug information handout will be sent.      The patient or caregiver noted above participated in the development of this care plan and knows that they can request review of or adjustments to the care plan at any time.      Patient or caregiver verbalized understanding of the above information as well as how to contact the pharmacy at [phone] option 4 with any questions/concerns.  The pharmacy is open Monday through Friday 8:30am-4:30pm.  A pharmacist is available 24/7 via pager to answer any clinical questions they may have.    Patient Specific Needs     Does the patient have any physical, cognitive, or cultural barriers? No    Does the patient have adequate living arrangements? (i.e. the ability to store and take their medication appropriately) Yes    Did you identify any home environmental safety or security hazards? No    Patient prefers to have medications discussed with  Patient     Is the patient or caregiver able to read and understand education materials at a high school level or above? Yes    Patient's primary language is  English     Is the patient high risk? No    SOCIAL DETERMINANTS OF HEALTH     At the [facility], we have learned that life circumstances - like trouble affording food, housing, utilities, or transportation can affect the health of many of our patients.   That is why we wanted to ask: are you currently experiencing any life circumstances that are negatively impacting your health and/or quality of life? Patient declined to answer    Social Determinants of Health     Financial Resource Strain: Not on file   Internet Connectivity: Not on file   Food Insecurity: Not on file   Tobacco Use: Medium Risk (05/15/2022)    Received from [facility]    Patient History     Smoking Tobacco Use: Former     Smokeless Tobacco Use: Never     Passive Exposure: Not on file   Housing/Utilities: Not on file   Alcohol Use: Not on file   Transportation Needs: Not on file   Substance Use: Not on file   Health Literacy: Not on file   Physical Activity: Not on file   Interpersonal Safety: Not on file   Stress: Not on file   Intimate Partner Violence: Not on file   Depression: Not at risk (03/18/2020)    Received from [facility] visits prior to 07/08/2022.    PHQ-2     SDOH PHQ2 SCORE: 0   Social Connections: Not on file       Would you be willing to receive help with any of the needs that you have identified today? Not applicable       Roderic Palau, PharmD  [facility] Specialty Pharmacist

## 2022-09-25 NOTE — Unmapped (Signed)
Hepatology Clinic Pharmacist Note    Primary Hepatology provider: Vikki  Diagnosis: Chronic Hep B  Fibrosis: F4 per MRI 01/03/22    Current regimen: Vemlidy 25mg  daily with food since 04/2018  Pharmacy: [facility] [phone] option #4, then option 2    Prior treatment history:  Lamivudine & adefovir ~3-4 years  TDF 06/02/2013-04/25/18    Pt called Lanora Manis, RN and reported only has #3 tabs left of Vemlidy. MAP team has reached out to pt multiple times to help with MFR application for [facility] but pt has been unresponsive.      Will bridge to TDF until his appt with Vallery Sa, NP scheduled on 10/26/22. Sent rx to [facility].     Park Breed, Pharm D., BCPS, BCGP, CPP  [facility]  [address]  [place], [place] [address]  [phone]

## 2022-09-25 NOTE — Unmapped (Signed)
VM from patient stating that he has been taking Vemlidy; however, his co-pay is $351 per 30 day supply     If he changes back to Viread, then his copay would be $100    Pt states he has 3 days left of Vemlidy at this time    Chart reviewed--> attempts were made by [facility] to contact the pt to apply for manufacturer assistance for [facility] however they weren't able to reach the patient    Spoke with pharmacist Quentin Angst.) who will send in a prescription for Viread to the pt's pharmacy    VM left with pt to let him know a new prescription is being sent to [facility]

## 2022-09-28 MED ORDER — OLANZAPINE 5 MG TABLET
ORAL_TABLET | Freq: Every evening | ORAL | 0 refills | 30 days | Status: CP
Start: 2022-09-28 — End: 2022-10-28

## 2022-10-03 NOTE — Unmapped (Signed)
This is  Department of Psychiatry calling to schedule your follow up appointment. Please give us a call back at phone number [phone] option 3, when you are ready to schedule. Thank you and have a great day.

## 2022-10-13 DIAGNOSIS — K7460 Unspecified cirrhosis of liver: Principal | ICD-10-CM

## 2022-10-13 DIAGNOSIS — B181 Chronic viral hepatitis B without delta-agent: Principal | ICD-10-CM

## 2022-10-13 NOTE — Unmapped (Signed)
VM from patient stating that he has an appointment with Vallery Sa, NP at the end of June & wants to know if should would like bloodwork done in advance--if so then he would like it done at [facility] in [place]    Chart reviewed--> will enter lab orders as they have been entered in the past & notify the pt to have them drawn    [employer]

## 2022-10-19 ENCOUNTER — Other Ambulatory Visit: Payer: Self-pay | Admitting: Nurse Practitioner

## 2022-10-24 ENCOUNTER — Ambulatory Visit: Admit: 2022-10-24 | Discharge: 2022-10-25 | Payer: MEDICARE

## 2022-10-24 DIAGNOSIS — B181 Chronic viral hepatitis B without delta-agent: Principal | ICD-10-CM

## 2022-10-24 LAB — HEPATIC FUNCTION PANEL
ALBUMIN: 3.5 g/dL (ref 3.4–5.0)
ALKALINE PHOSPHATASE: 91 U/L (ref 46–116)
ALT (SGPT): 64 U/L — ABNORMAL HIGH (ref 10–49)
AST (SGOT): 41 U/L — ABNORMAL HIGH (ref <34)
BILIRUBIN DIRECT: 0.2 mg/dL (ref 0.00–0.30)
BILIRUBIN TOTAL: 0.4 mg/dL (ref 0.3–1.2)
PROTEIN TOTAL: 6.6 g/dL (ref 5.7–8.2)

## 2022-10-24 LAB — CBC W/ AUTO DIFF
BASOPHILS ABSOLUTE COUNT: 0 10*9/L (ref 0.0–0.1)
BASOPHILS RELATIVE PERCENT: 0.4 %
EOSINOPHILS ABSOLUTE COUNT: 0.1 10*9/L (ref 0.0–0.5)
EOSINOPHILS RELATIVE PERCENT: 1.3 %
HEMATOCRIT: 40.4 % (ref 39.0–48.0)
HEMOGLOBIN: 13.3 g/dL (ref 12.9–16.5)
LYMPHOCYTES ABSOLUTE COUNT: 1.1 10*9/L (ref 1.1–3.6)
LYMPHOCYTES RELATIVE PERCENT: 20.1 %
MEAN CORPUSCULAR HEMOGLOBIN CONC: 33 g/dL (ref 32.0–36.0)
MEAN CORPUSCULAR HEMOGLOBIN: 32.1 pg (ref 25.9–32.4)
MEAN CORPUSCULAR VOLUME: 97.4 fL — ABNORMAL HIGH (ref 77.6–95.7)
MEAN PLATELET VOLUME: 10.4 fL (ref 6.8–10.7)
MONOCYTES ABSOLUTE COUNT: 0.4 10*9/L (ref 0.3–0.8)
MONOCYTES RELATIVE PERCENT: 7.6 %
NEUTROPHILS ABSOLUTE COUNT: 3.8 10*9/L (ref 1.8–7.8)
NEUTROPHILS RELATIVE PERCENT: 70.6 %
PLATELET COUNT: 72 10*9/L — ABNORMAL LOW (ref 150–450)
RED BLOOD CELL COUNT: 4.15 10*12/L — ABNORMAL LOW (ref 4.26–5.60)
RED CELL DISTRIBUTION WIDTH: 14.5 % (ref 12.2–15.2)
WBC ADJUSTED: 5.4 10*9/L (ref 3.6–11.2)

## 2022-10-24 LAB — AFP TUMOR MARKER: AFP-TUMOR MARKER: 3 ng/mL (ref <=8)

## 2022-10-24 LAB — BASIC METABOLIC PANEL
ANION GAP: 4 mmol/L (ref 3–11)
BLOOD UREA NITROGEN: 10 mg/dL (ref 9–23)
BUN / CREAT RATIO: 13
CALCIUM: 9.5 mg/dL (ref 8.7–10.4)
CHLORIDE: 111 mmol/L — ABNORMAL HIGH (ref 98–107)
CO2: 29 mmol/L (ref 20.0–31.0)
CREATININE: 0.75 mg/dL (ref 0.73–1.18)
EGFR CKD-EPI (2021) MALE: >90 mL/min/{1.73_m2} (ref >=60)
GLUCOSE RANDOM: 123 mg/dL (ref 70–179)
POTASSIUM: 4.3 mmol/L (ref 3.5–5.1)
SODIUM: 144 mmol/L (ref 136–145)

## 2022-10-24 LAB — PROTIME-INR
INR: 1.14
PROTIME: 12.7 s — ABNORMAL HIGH (ref 9.9–12.6)

## 2022-10-24 MED ORDER — TENOFOVIR DISOPROXIL FUMARATE 300 MG TABLET
ORAL_TABLET | Freq: Every day | ORAL | 5 refills | 30 days | Status: CP
Start: 2022-10-24 — End: ?
  Filled 2022-10-26: qty 30, 30d supply, fill #0

## 2022-10-24 NOTE — Unmapped (Signed)
[facility] Refill Coordination Note    Nicholas [name], DOB: [birth year]  Phone: [phone] (home) [phone] (work)      All above HIPAA information was verified with patient.         10/17/2022     2:52 PM   Specialty Rx Medication Refill Questionnaire   Which Medications would you like refilled and shipped? Viread 10 days on-hand   Please list all current allergies: on file   Have you missed any doses in the last 30 days? No   Have you had any changes to your medication(s) since your last refill? No   How many days remaining of each medication do you have at home? 10   Have you experienced any side effects in the last 30 days? No   Please enter the full address (street address, city, state, zip code) where you would like your medication(s) to be delivered to. [address], [place], [place] [address]   Please specify on which day you would like your medication(s) to arrive. Note: if you need your medication(s) within 3 days, please call the pharmacy to schedule your order at [phone]  10/25/2022   Has your insurance changed since your last refill? No   Would you like a pharmacist to call you to discuss your medication(s)? No   Do you require a signature for your package? (Note: if we are billing Medicare Part B or your order contains a controlled substance, we will require a signature) No         Completed refill call assessment today to schedule patient's medication shipment from the [facility] [phone]).  All relevant notes have been reviewed.       Confirmed patient received a Conservation officer, historic buildings and a Surveyor, mining with first shipment. The patient will receive a drug information handout for each medication shipped and additional FDA Medication Guides as required.         REFERRAL TO PHARMACIST     Referral to the pharmacist: Not needed      [facility]     Shipping address confirmed in Epic.     Delivery Scheduled: Yes, Expected medication delivery date: 10/27/2022.  However, Rx request for refills was sent to the provider as there are none remaining.     10/24/22-LVM to inform pt of medication delivery delay.    Medication will be delivered via UPS to the prescription address in Epic [facility].    [name]   [facility] Specialty Technician

## 2022-10-24 NOTE — Unmapped (Signed)
Refill request for Viread    Patient also called a few minutes ago requesting the refill--> he has an appointment with Vikki on 6/20    Will authorize refill at this time    [facility]

## 2022-10-25 LAB — HEPATITIS B DNA, QUANTITATIVE, PCR: HBV DNA QUANT: NOT DETECTED

## 2022-10-25 NOTE — Unmapped (Signed)
[facility]  Psychiatry   Established Patient E&M Service - Outpatient       Assessment:    Nicholas [name] presents for follow-up evaluation. Today, Nicholas [name] is doing fairly well. Noting some improvement in mood since our last visit. Still having days where he feels more down on a regular basis. Still having facial movements. Also reporting occasional grunting though he feels this may be a habit rather than evidence of TD. He is taking B6 w/ no SE, but it is not clear whether this has had significant benefit for TD. At this time he is pursuing assistance to be able to get Vraylar affordably. If he is able to do that we will plan to transition to Vraylar 1.5 mg and stop Zyprexa. We continue to discuss potential for lowering dose of lithium given his age. Will defer for now given planned transition from Zyprexa to Vraylar. Will follow-up in 1 month.    Identifying Information:  Nicholas [name] is a 67 y.o. male with a history of  Bipolar I Disorder that was diagnosed in 2003. He established with this clinic in September 2022. At that time, he was prescribed vraylar, lithium, doxepin, and xanax with fairly good efficacy and tolerability.     Risk Assessment:  A full psychiatric risk assessment was conducted on 01/31/21 and risks do not appear significantly changed from that visit.   While future psychiatric events cannot be accurately predicted, the patient does not currently require acute inpatient psychiatric care and does not currently meet [facility] involuntary commitment criteria.     Plan:    #Bipolar I Disorder:  --Continue Lithium ER 450 mg QAM, 900 mg at bedtime. Level of 0.9 in February 2024. Could consider reducing dose to target lower level given pt's age.  -- Continue Zyprexa 5 mg nightly.   --Plan to transition back to Vraylar if approved for assistance.  --Recommend taking 400 mg of B6 for TD (has been taking for ~2 months)  --Continue hydroxyzine 25 mg BID PRN.   --Continue Effexor 225 mg daily (s 4/11,i5/12, I5/22, i11/6/23)  --Previously discussed behavioral activation. Have previously discussed getting more plugged in with local community (maybe a church).  --Recommend discussing PT with PCP to help w/ his unsteadiness on feet/hoping this could increase activity level.  --Consider therapy, previously referred to resident CBT but patient never scheduled.     #Medication monitoring:   --Recent Cr of 0.8 per careeverywhere records  --Due for metabolic monitoring in February 2025  --Due for lithium labs in July 2024 (planning to monitor Q6 months given age)  --AIMS on 06/19/22 of 2 (scanned to media). Scored for movements in face, mouth, jaw, tongue. Had significant twitching in upper face, tongue movements (though not observed w/ opening but apparent throughout interview and with distraction), puckering. Patient also made frequent vocalizations which he reported no awareness of.    Lab Results   Component Value Date    Cholesterol 133 06/21/2022    LDL Calculated 82 06/21/2022    HDL 33 (L) 06/21/2022    Triglycerides 88 06/21/2022     Hemoglobin A1C:   Lab Results   Component Value Date    Hemoglobin A1C 6.8 01/12/2021     Lithium Lvl   Date Value Ref Range Status   06/21/2022 0.9 0.5 - 1.2 mmol/L Final     TSH   Date Value Ref Range Status   06/21/2022 2.489 0.550 - 4.780 uIU/mL Final  Psychotherapy provided:  No billable psychotherapy service provided but brief supportive therapy was utilized.    Patient has been given this writer's contact information as well as the [facility] urgent line number. The patient has been instructed to call 911 for emergencies.    Subjective:    Interval History:  Pt reports he is in [name] right now. States that he is pretty good in general. States that he feels down 2x per week. States that I think I'm overall better. Reports that his husband says he is grunting a lot. Reports he thinks it is a habit, not TD. States that this comes up a few times per day.     States that he sent me in the [employer] application. Thinks I just need to sign it.     States that he is still having some facial movements. They are better than they were. They aren't bothersome for him. He is still taking B6. He is not sure if that has affected TD.    He has been traveling a lot lately. Will be in [name] for 2 months soon. They will also be in [name] soon. He does like moving around. If he doesn't have his dogs w/ him he misses them. He has dauchsund mixes Nature conservation officer and Nicholas [name], both are 68 years old).    Sleep has been better. Hasn't woken up as much. He is waking 2x. Previously waking up hourly. Taking melatonin w/ good effect.    Reviewed medication list. Denies any med SE. Denies any missed doses.     His diarrhea has been a lot better.    His BIL had a stroke recently. He was hospitalized and has been to rehab. 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 This has been stressful. Nicholas [name]'s boss has breast cancer.     Discussed POC (see assessment above for details). No other Q's or concerns today.    Objective:    Mental Status Exam:  Appearance:    Appears stated age, Well nourished, Well developed and Clean/Neat   Motor:   Some twitching in upper face. Some puckering, lip movements. No other abnormal movements noted on interview.   Speech/Language:    Normal rate, volume, tone, fluency and Language intact, well formed.    Mood:   Pretty good   Affect:   Euthymic   Thought process and Associations:   Logical, linear, clear, coherent, goal directed   Abnormal/psychotic thought content:    Some improvement in mood. Not endorsing SI.  Remains future oriented. Not voicing any bizarre or delusional thought content on interview.   Perceptual disturbances:      He is not endorsing any AVH. No RIS on interview.     Other:          I personally spent 36 minutes face-to-face and non-face-to-face in the care of this patient, which includes all pre, intra, and post visit time on the date of service.  All documented time was specific to the E/M visit and does not include any procedures that may have been performed.      The patient reports they are physically located in [place] and is currently: not at home. I conducted a audio/video visit. I spent  96m 54s on the video call with the patient. I spent an additional 15 minutes on pre- and post-visit activities on the date of service . Spent additional time today completing [employer] paperwork.      Dennison Bulla, MD

## 2022-10-26 ENCOUNTER — Telehealth
Admit: 2022-10-26 | Discharge: 2022-10-27 | Payer: MEDICARE | Attending: Student in an Organized Health Care Education/Training Program | Primary: Student in an Organized Health Care Education/Training Program

## 2022-10-26 ENCOUNTER — Ambulatory Visit: Admit: 2022-10-26 | Discharge: 2022-10-27 | Payer: MEDICARE

## 2022-10-26 DIAGNOSIS — Z79899 Other long term (current) drug therapy: Principal | ICD-10-CM

## 2022-10-26 DIAGNOSIS — F3130 Bipolar disorder, current episode depressed, mild or moderate severity, unspecified: Principal | ICD-10-CM

## 2022-10-26 DIAGNOSIS — K7460 Unspecified cirrhosis of liver: Principal | ICD-10-CM

## 2022-10-26 DIAGNOSIS — B181 Chronic viral hepatitis B without delta-agent: Principal | ICD-10-CM

## 2022-10-26 DIAGNOSIS — K529 Noninfective gastroenteritis and colitis, unspecified: Secondary | ICD-10-CM | POA: Diagnosis not present

## 2022-10-26 LAB — HEPATITIS B E ANTIGEN: HEPATITIS BE ANTIGEN: NEGATIVE

## 2022-10-26 LAB — HEPATITIS B E ANTIBODY: HEPATITIS BE ANTIBODY: NEGATIVE

## 2022-10-26 MED ORDER — LITHIUM CARBONATE ER 450 MG TABLET,EXTENDED RELEASE
ORAL_TABLET | 2 refills | 0 days | Status: CP
Start: 2022-10-26 — End: ?

## 2022-10-26 MED ORDER — VENLAFAXINE ER 75 MG CAPSULE,EXTENDED RELEASE 24 HR
ORAL_CAPSULE | Freq: Every day | ORAL | 2 refills | 30 days | Status: CP
Start: 2022-10-26 — End: 2023-01-24

## 2022-10-26 MED ORDER — OLANZAPINE 5 MG TABLET
ORAL_TABLET | Freq: Every evening | ORAL | 0 refills | 30 days | Status: CP
Start: 2022-10-26 — End: 2022-11-25

## 2022-10-26 NOTE — Unmapped (Signed)
Increase imodium to 2 pills twice per day.     If the diarrhea decreases significantly then start tapering down the enzymes.     I'll make a referral to [facility] GI when I've looked through your chart.     MRI in [place]    I'll send some anti-anxiety meds for pre-MRI

## 2022-10-26 NOTE — Unmapped (Unsigned)
[facility], [facility], [place]  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  He denies any complications including variceal bleed, ascites or hepatic encephalopathy. He has not been evaluated for transplant secondary low meld score (6). Since his last clinic visit he denies any complications or hospitalizations.  He remains on Vemlidy 25 mg daily.  l.  His most recent MRI was on 01/10/2021 and showed LR-3 nodule in the right hepatic dome which appears similar to prior.  Additional LR-2 perfusional changes are similar to prior.  He  underwent a bone density on 10/31/2018 which was normal.  He was diagnosed with chronic hepatitis B approximately 15 years ago.  Patient has never undergone a liver biopsy.  He was treated with Epivir and Hepsera for approximately 3-4 years but this was discontinued approximately 5 years ago when he was hospitalized for bipolar disorder.  He was never retreated.  He was initially seen in Hepatology clinic on 06/02/2013. He does not use any alcohol products at this time and only uses occasional alcohol in his 20s or 30s. Has well controlled bi-polar.        Today  he overall feels well and denies any fever, chills, headache, jaundice, chest pain, upper lower GI bleeding, melena or confusion.  Has a historic diagnosis of pancreatic insuffiencey and is on high dose enzymes. However continues with urgent stool, diarrhea and fecal incontinence at times. Is taking imodium once daily and this seems to help.     CIRRHOSIS CARE:  1. EGD: 01/2020. OSH On nadolol.  2. Imaging: HCC screening over due, MRI ordered  3. Vaccination for hepatitis A and B: Immune to A.  4. SBP prophylaxis [h/o SBP, or CP ? 9 w/ bili ? 3, or cr ? 1.2, BUN ? 25, or Na ? 130]:   5. Bone health: 10/31/2018  6. Nutrition: wt & exercise mgmnt, low Na intake and bedtime snack discussed.  7. OTC agents: proper use of acetaminophen and avoidance of NSAIA's & HDS products discussed.  8. Transplant: Not in evaluation secondary low meld score/clinical stability.        PMH:  1.  Chronic hepatitis B with cirrhosis  2.  Bipolar disorder.  3.  Depression  4 chronic diarrhea pancreatic insufficiency     PSH:  1.  Appendectomy    MEDICATIONS:     ALLERGIES:  1.  Lamictal-rash  2.  Lexapro  3.  Abilify  4. Oxycodone-itching      FH: No known liver disease or cancer    SH: He is single and arrives today accompanied by his partner.  He quit tobacco several years ago and does not use any alcohol products.    Imaging  MRI 12/2021  Impression   1. Stable enhancing 8 mm focus in the hepatic dome is consistent   with a LI-RADS 3 lesion.  No new suspicious arterially enhancing   hepatic lesions.   2. 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         Electronically Signed     By: Maudry Mayhew M.D.     On: 01/04/2022 11:07       LABS:   Lab Results   Component Value Date    WBC 5.4 10/24/2022    HGB 13.3 10/24/2022    HCT 40.4 10/24/2022    PLT 72 (L) 10/24/2022       Lab Results   Component Value Date    NA 144 10/24/2022    K 4.3 10/24/2022    CL 111 (H) 10/24/2022    CO2 29.0 10/24/2022    BUN 10 10/24/2022    CREATININE 0.75 10/24/2022    GLU 123 10/24/2022    CALCIUM 9.5 10/24/2022    PHOS 4.0 04/23/2018       Lab Results   Component Value Date    BILITOT 0.4 10/24/2022 BILIDIR 0.20 10/24/2022    PROT 6.6 10/24/2022    ALBUMIN 3.5 10/24/2022    ALT 64 (H) 10/24/2022    AST 41 (H) 10/24/2022    ALKPHOS 91 10/24/2022    GGT 42 12/04/2018       Lab Results   Component Value Date    LABPROT 11.2 10/30/2019    INR 1.14 10/24/2022     MELD 3.0: 7 at 10/24/2022  5:00 PM  MELD-Na: 7 at 10/24/2022  5:00 PM  Calculated from:  Serum Creatinine: 0.75 mg/dL (Using min of 1 mg/dL) at 2/44/0102  7:25 PM  Serum Sodium: 144 mmol/L (Using max of 137 mmol/L) at 10/24/2022  5:00 PM  Total Bilirubin: 0.4 mg/dL (Using min of 1 mg/dL) at 3/66/4403  4:74 PM  Serum Albumin: 3.5 g/dL at 2/59/5638  7:56 PM  INR(ratio): 1.14 at 10/24/2022  5:00 PM  Age at listing (hypothetical): 50 years  Sex: Male at 10/24/2022  5:00 PM      CP score:6  Childs Class: A    ASSESSMENT:Nicholas [name] is a pleasant 67 year old Caucasian gentleman with a 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  Today's visit is being done via video..  .  He denies any complications including variceal bleed, ascites or hepatic encephalopathy. He has not been evaluated for transplant secondary low meld score (6). Since his last clinic visit he denies any complications or hospitalizations.  He remains on Viread 300 mg daily, Vemlidy too expensive.   Cirrhosis care is up to date.  Today  he overall feels well and denies any fever, chills, headache, jaundice, chest pain, upper lower GI bleeding, melena or confusion.    In a thorough chart review I cannot find a 24hour fecal fat study.   Patient is relocating to [facility] and would like a GI at [facility].       PLAN:  1.  I have discussed his care with Dr. Pervis Hocking  2.  Check laboratory studies   3.  Continue Viread.   4.  MRI at [facility]  5. Increase imodium to twice per day, if helpful for diarrhea, start backing down on pancreatic enzymes. Referral to [facility] GI  5.  Plan to see Mr. Vejar back in the liver clinic in 6 months.        Vallery Sa, NP-C  [facility] [address]  [place], [place]  [address]  Ph: [phone]  Fax: [phone] patient reports they are currently: at home. I spent 20 minutes on the real-time audio and video visit with the patient on the date of service. I spent an additional 10 minutes on pre- and post-visit activities on the date of service.     The patient was not located and I was located within 250 yards of a hospital based location during the real-time audio and video visit. The patient was physically located in [place] or a state in which I am permitted to provide care. The patient and/or parent/guardian understood that s/he may incur co-pays and cost sharing, and agreed to the telemedicine visit. The visit was reasonable and appropriate under the circumstances given the patient's presentation at the time.    The patient and/or parent/guardian has been advised of the potential risks and limitations of this mode of treatment (including, but not limited to, the absence of in-person examination) and has agreed to be treated using telemedicine. The patient's/patient's family's questions regarding telemedicine have been answered.    If the visit was completed in an ambulatory setting, the patient and/or parent/guardian has also been advised to contact their provider???s office for worsening conditions, and seek emergency medical treatment and/or call 911 if the patient deems either necessary.          Vallery Sa, NP-C  [facility]  [address]  [place], [place]  [address]  Ph: [phone]  Fax: [phone]

## 2022-10-27 MED ORDER — ALPRAZOLAM 0.5 MG TABLET
ORAL_TABLET | 0 refills | 0 days | Status: CP
Start: 2022-10-27 — End: ?

## 2022-11-01 ENCOUNTER — Encounter: Payer: Self-pay | Admitting: "Endocrinology

## 2022-11-01 ENCOUNTER — Ambulatory Visit: Payer: Medicare HMO | Admitting: "Endocrinology

## 2022-11-01 VITALS — BP 104/68 | HR 64 | Ht 70.0 in | Wt 255.6 lb

## 2022-11-01 DIAGNOSIS — Z6836 Body mass index (BMI) 36.0-36.9, adult: Secondary | ICD-10-CM | POA: Diagnosis not present

## 2022-11-01 DIAGNOSIS — Z7984 Long term (current) use of oral hypoglycemic drugs: Secondary | ICD-10-CM | POA: Diagnosis not present

## 2022-11-01 DIAGNOSIS — E1165 Type 2 diabetes mellitus with hyperglycemia: Secondary | ICD-10-CM

## 2022-11-01 DIAGNOSIS — E782 Mixed hyperlipidemia: Secondary | ICD-10-CM | POA: Diagnosis not present

## 2022-11-01 LAB — POCT GLYCOSYLATED HEMOGLOBIN (HGB A1C): HbA1c, POC (controlled diabetic range): 7.2 % — AB (ref 0.0–7.0)

## 2022-11-01 MED ORDER — EMPAGLIFLOZIN 10 MG PO TABS: 10.0000 mg | ORAL_TABLET | Freq: Every day | ORAL | 1 refills | Status: AC

## 2022-11-01 NOTE — Progress Notes (Signed)
Endocrinology Follow Up Note       11/01/2022, 4:50 PM   Subjective:    Patient ID: Angel [name], male    DOB: February 14, 1956.  Angel [name] is being seen in follow up after being seen in consultation for management of currently uncontrolled symptomatic diabetes requested by  Esperanza Richters, PA-C.   Past Medical History:  Diagnosis Date   Bipolar disorder (HCC)    Cirrhosis of liver without mention of alcohol    Depression    GERD (gastroesophageal reflux disease)    Headache    Hepatitis B    Hepatitis E. 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 has lost 10 lbs since pre-visit, intentionally with diet and walking    Past Surgical History:  Procedure Laterality Date   APPENDECTOMY     COLONOSCOPY     POLYPECTOMY     UPPER GASTROINTESTINAL ENDOSCOPY      Social History   Socioeconomic History   Marital status: Single    Spouse name: Angel [name]   Number of children: 0   Years of education: Not on file   Highest education level: Master's degree (e.g., MA, MS, MEng, MEd, MSW, MBA)  Occupational History   Occupation: retired    Associate Professor: NOT EMPLOYED  Tobacco Use   Smoking status: Former    Types: Cigarettes    Quit date: 05/08/2009    Years since quitting: 13.4   Smokeless tobacco: Never  Vaping Use   Vaping Use: Never used  Substance and Sexual Activity   Alcohol use: No    Alcohol/week: 0.0 standard drinks of alcohol   Drug use: No   Sexual activity: Not Currently  Other Topics Concern   Not on file  Social History Narrative   Patient is right-handed. He is married to same sex partner. He gets little exercise.   Caffeine: 4 glasses of tea max/day   Social Determinants of Health   Financial Resource Strain: Not on file   Food Insecurity: Not on file  Transportation Needs: Not on file  Physical Activity: Not on file  Stress: Not on file  Social Connections: Not on file    Family History  Problem Relation Age of Onset   Heart disease Mother    Diabetes Father    Kidney failure Father    Kidney disease Father    Other Sister        GERD   Colon cancer Neg Hx    Rectal cancer Neg Hx    Stomach cancer Neg Hx    Esophageal cancer Neg Hx    Headache Neg Hx    Migraines Neg Hx     Outpatient Encounter Medications as of 11/01/2022  Medication Sig   empagliflozin (JARDIANCE) 10 MG TABS tablet Take 1 tablet (10 mg total) by mouth daily before breakfast.   tenofovir (VIREAD) 300 MG tablet Take 1 tablet by mouth daily.   Blood Glucose Monitoring Suppl (ACCU-CHEK AVIVA PLUS) w/Device KIT Check sugar  TID . Dx code:R73.9   diphenoxylate-atropine (LOMOTIL) 2.5-0.025 MG tablet Take 1 tablet by mouth 4 (four) times daily as needed for diarrhea or loose stools.   glipiZIDE (GLUCOTROL XL) 5 MG 24 hr tablet TAKE 1 TABLET BY MOUTH EVERY MORNING WITH BREAKFAST   glucose blood (ONETOUCH VERIO) test strip USE TO CHECK BLOOD SUGAR BEFORE BREAKFAST AND BEFORE AT BEDTIME   Lancet Devices (LANCING DEVICE) MISC 1 Device by Does not apply route as directed. Compatible with onetouch ultrasoft lancets   Lancets (ONETOUCH DELICA PLUS LANCET33G) MISC USE AS INSTRUCTED TO MONITOR GLUCOSE TWICE DAILY   lithium carbonate (ESKALITH) 450 MG CR tablet Take 1,400 mg by mouth daily.   Melatonin 10 MG SUBL Place 10 mg under the tongue at bedtime.   nadolol (CORGARD) 20 MG tablet Take 20 mg by mouth daily.   Pancrelipase, Lip-Prot-Amyl, (CREON PO) Take by mouth. 21000 units with meals and snacks   [DISCONTINUED] Tenofovir Alafenamide Fumarate (VEMLIDY) 25 MG TABS Take by mouth.   No facility-administered encounter medications on file as of 11/01/2022.    ALLERGIES: Allergies  Allergen Reactions   Aripiprazole Other (See Comments)     Tardive Dyskinesia   Aripiprazole     Other reaction(s): Other (See Comments) Tardive Dyskinesia   Invega [Paliperidone Er] Other (See Comments)    Tardive dyskinsia symptoms   Quetiapine Diarrhea, Hives, Itching, Nausea And Vomiting, Nausea Only, Shortness Of Breath and Rash   Eszopiclone Other (See Comments)    Sleep walking   Escitalopram     Other reaction(s): Other (See Comments) Doesn't Work   Hydrocodone Itching   Hydrocodone Bit-Homatrop Mbr Rash   Lamictal [Lamotrigine] Rash   Lexapro [Escitalopram Oxalate] Other (See Comments)    Doesn't Work    VACCINATION STATUS: Immunization History  Administered Date(s) Administered   Influenza Split 04/11/2011   Influenza,inj,Quad PF,6+ Mos 04/09/2014, 01/19/2015, 03/27/2016, 02/27/2017, 03/14/2018, 01/02/2019, 02/26/2020   Influenza,inj,quad, With Preservative 03/21/2013   Influenza,trivalent, recombinat, inj, PF 04/11/2011   Pneumococcal Polysaccharide-23 03/21/2013   Tdap 10/07/2013   Zoster Recombinat (Shingrix) 01/02/2019, 03/04/2019   Zoster, Live 08/12/2015    Diabetes He presents for his follow-up diabetic visit. He has type 2 diabetes mellitus. The initial diagnosis of diabetes was made 2 months (Diagnosed approx 2 months ago) ago. His disease course has been worsening. There are no hypoglycemic associated symptoms. Associated symptoms include foot paresthesias. Pertinent negatives for diabetes include no blurred vision, no fatigue, no foot ulcerations, no polydipsia, no polyuria and no weight loss. There are no hypoglycemic complications. Symptoms are worsening. Diabetic complications include nephropathy and peripheral neuropathy. Risk factors for coronary artery disease include diabetes mellitus, dyslipidemia, family history, male sex, obesity and sedentary lifestyle. Current diabetic treatment includes diet and oral agent (monotherapy). He is compliant with treatment most of the time. His weight is decreasing steadily. He  is following a generally unhealthy diet. When asked about meal planning, he reported none. He participates in exercise intermittently. His home blood glucose trend is increasing steadily. His breakfast blood glucose range is generally 140-180 mg/dl. His bedtime blood glucose range is generally 140-180 mg/dl. His overall blood glucose range is 140-180 mg/dl. (He presents today with his meter and logs showing above target glycemic profile at fasting and point-of-care A1c of 7.2% increasing from 6.6%.   No hypoglycemia.  Patient is only on glipizide 5 mg p.o. daily at breakfast.   ) An ACE inhibitor/angiotensin II receptor blocker is not being taken. He does not see a  podiatrist.Eye exam is current.  Hyperlipidemia This is a chronic problem. The current episode started more than 1 year ago. The problem is uncontrolled. Recent lipid tests were reviewed and are variable. Exacerbating diseases include chronic renal disease, diabetes, liver disease and obesity. Factors aggravating his hyperlipidemia include beta blockers and fatty foods. He is currently on no antihyperlipidemic treatment. Compliance problems include adherence to exercise.  Risk factors for coronary artery disease include diabetes mellitus, dyslipidemia, family history, obesity, male sex and a sedentary lifestyle.     Review of systems  Constitutional: + slow increase in body weight,  current Body mass index is 36.67 kg/m. , no fatigue, no subjective hyperthermia, no subjective hypothermia   Objective:     BP 104/68   Pulse 64   Ht 5\' 10"  (1.778 m)   Wt 255 lb 9.6 oz (115.9 kg)   BMI 36.67 kg/m   Wt Readings from Last 3 Encounters:  11/01/22 255 lb 9.6 oz (115.9 kg)  11/29/21 270 lb 12.8 oz (122.8 kg)  11/02/21 275 lb (124.7 kg)     BP Readings from Last 3 Encounters:  11/01/22 104/68  03/02/22 110/62  11/29/21 108/62      Physical Exam- Limited  Constitutional:  Body mass index is 36.67 kg/m. , not in acute distress,  normal state of mind Eyes:  EOMI, no exophthalmos Neck: Supple     CMP ( most recent) CMP     Component Value Date/Time   NA 139 02/28/2022 1057   K 4.1 02/28/2022 1057   CL 102 02/28/2022 1057   CO2 22 02/28/2022 1057   GLUCOSE 250 (H) 02/28/2022 1057   GLUCOSE 119 (H) 10/06/2020 1552   BUN 9 02/28/2022 1057   CREATININE 0.98 02/28/2022 1057   CREATININE 1.00 03/10/2020 1011   CALCIUM 8.6 02/28/2022 1057   PROT 6.0 02/28/2022 1057   ALBUMIN 3.7 (L) 02/28/2022 1057   AST 25 02/28/2022 1057   ALT 39 02/28/2022 1057   ALKPHOS 56 02/28/2022 1057   BILITOT 0.5 02/28/2022 1057   GFRNONAA 94 05/06/2019 1324   GFRNONAA 92 02/09/2017 1527   GFRAA 109 05/06/2019 1324   GFRAA 106 02/09/2017 1527     Diabetic Labs (most recent): Lab Results  Component Value Date   HGBA1C 7.2 (A) 11/01/2022   HGBA1C 6.6 03/02/2022   HGBA1C 6.6 11/29/2021   MICROALBUR 10 01/12/2021   MICROALBUR 0.4 02/10/2020     Lipid Panel ( most recent) Lipid Panel     Component Value Date/Time   CHOL 126 02/28/2022 1057   TRIG 162 (H) 02/28/2022 1057   HDL 30 (L) 02/28/2022 1057   CHOLHDL 4.2 02/28/2022 1057   CHOLHDL 5.4 (H) 02/10/2020 1054   VLDL 32.8 09/28/2016 1659   LDLCALC 68 02/28/2022 1057   LDLCALC 93 02/10/2020 1054   LABVLDL 28 02/28/2022 1057      Lab Results  Component Value Date   TSH 2.980 02/28/2022   TSH 3.360 05/16/2021   TSH 0.72 11/28/2019   TSH 3.160 05/06/2019   TSH 2.43 10/11/2017   TSH 3.955 04/22/2015   TSH 2.87 10/07/2013   FREET4 1.01 02/28/2022   FREET4 0.91 05/16/2021      Assessment & Plan:   1) Uncontrolled Type 2 Diabetes with hyperglycemia He presents today accompanied by his partner with continued improvement in his glycemic profile.    He presents today with his meter and logs showing above target glycemic profile at fasting and point-of-care A1c of 7.2% increasing  from 6.6%.   No hypoglycemia.  Patient is only on glipizide 5 mg p.o. daily at  breakfast.    - Angel [name] has currently uncontrolled symptomatic type 2 DM since 67 years of age.  -Recent labs reviewed.  - I had a long discussion with him about the progressive nature of diabetes and the pathology behind its complications. -his diabetes is complicated by peripheral neuropathy and mild CKD and he remains at a high risk for more acute and chronic complications which include CAD, CVA, and retinopathy. These are all discussed in detail with him.  He will continue to benefit from lifestyle medicine. - he acknowledges that there is a room for improvement in his food and drink choices. - Suggestion is made for him to avoid simple carbohydrates  from his diet including Cakes, Sweet Desserts, Ice Cream, Soda (diet and regular), Sweet Tea, Candies, Chips, Cookies, Store Bought Juices, Alcohol , Artificial Sweeteners,  Coffee Creamer, and "Sugar-free" Products, Lemonade. This will help patient to have more stable blood glucose profile and potentially avoid unintended weight gain.  The following Lifestyle Medicine recommendations according to American College of Lifestyle Medicine  [facility]) were discussed and and offered to patient and he  agrees to start the journey:  A. Whole Foods, Plant-Based Nutrition comprising of fruits and vegetables, plant-based proteins, whole-grain carbohydrates was discussed in detail with the patient.   A list for source of those nutrients were also provided to the patient.  Patient will use only water or unsweetened tea for hydration. B.  The need to stay away from risky substances including alcohol, smoking; obtaining 7 to 9 hours of restorative sleep, at least 150 minutes of moderate intensity exercise weekly, the importance of healthy social connections,  and stress management techniques were discussed. C.  A full color page of  Calorie density of various food groups per pound showing examples of each food groups was provided to the patient.   - he has  been scheduled with Norm Salt, RDE for diabetes education.  - I have approached him with the following individualized plan to manage  his diabetes and patient agrees:   -In light of his increasing A1c and above target glycemic profile, I discussed and added Jardiance 10 mg p.o. daily at breakfast along with glipizide 5 mg XL p.o. daily at breakfast.   -He is encouraged to continue monitoring blood glucose twice daily, before breakfast and before bed, and to call the clinic if he has readings less than 70 or greater than 200 for 3 tests in a row.  - Specific targets for  A1c;  LDL, HDL,  and Triglycerides were discussed with the patient.  2) Blood Pressure /Hypertension: -His blood pressure is controlled to target.  he is advised to continue his current medications including Nadolol 20 mg p.o. daily with breakfast.  3) Lipids/Hyperlipidemia:    Review of his recent lipid panel showed controlled LDL at 68 overall improving.  He is on statins.    He has nonalcoholic liver disease , WFPB diet will help with his liver.   4)  Weight/Diet:  his Body mass index is 36.67 kg/m.  -  clearly complicating his diabetes care.   he is a candidate for weight loss. I discussed with him the fact that loss of 5 - 10% of his  current body weight will have the most impact on his diabetes management.  Exercise, and detailed carbohydrates information provided  -  detailed on discharge instructions.  5)  Chronic Care/Health Maintenance: -he not on ACEI/ARB or Statin medications and is encouraged to initiate and continue to follow up with Ophthalmology, Dentist, Podiatrist at least yearly or according to recommendations, and advised to stay away from smoking. I have recommended yearly flu vaccine and pneumonia vaccine at least every 5 years; moderate intensity exercise for up to 150 minutes weekly; and sleep for at least 7 hours a day.  - he is advised to maintain close follow up with [place], Ramon Dredge, PA-C for  primary care needs, as well as his other providers for optimal and coordinated care.   I spent  26  minutes in the care of the patient today including review of labs from CMP, Lipids, Thyroid Function, Hematology (current and previous including abstractions from other facilities); face-to-face time discussing  his blood glucose readings/logs, discussing hypoglycemia and hyperglycemia episodes and symptoms, medications doses, his options of short and long term treatment based on the latest standards of care / guidelines;  discussion about incorporating lifestyle medicine;  and documenting the encounter. Risk reduction counseling performed per USPSTF guidelines to reduce  obesity and cardiovascular risk factors.     Please refer to Patient Instructions for Blood Glucose Monitoring and Insulin/Medications Dosing Guide"  in media tab for additional information. Please  also refer to " Patient Self Inventory" in the Media  tab for reviewed elements of pertinent patient history.  Angel [name] participated in the discussions, expressed understanding, and voiced agreement with the above plans.  All questions were answered to his satisfaction. he is encouraged to contact clinic should he have any questions or concerns prior to his return visit.     Follow up plan: - Return in about 4 months (around 03/03/2023) for Bring Meter/CGM Device/Logs- A1c in Office.   Angel [name], [facility] [address] [place], [place] [address] Phone: [phone] Fax: [phone]  11/01/2022, 4:50 PM

## 2022-11-01 NOTE — Patient Instructions (Signed)

## 2022-11-06 ENCOUNTER — Telehealth: Payer: Self-pay

## 2022-11-06 NOTE — Telephone Encounter (Signed)
Pt called stating the copay for jardiance is $527.00. Requested a Rx for a substitute medication that will be less costly.

## 2022-11-07 NOTE — Telephone Encounter (Signed)
Left a message requesting pt return call to the office. 

## 2022-11-08 NOTE — Telephone Encounter (Signed)
Left a message requesting pt return call to the office. 

## 2022-11-10 ENCOUNTER — Ambulatory Visit: Admit: 2022-11-10 | Discharge: 2022-11-11 | Payer: MEDICARE

## 2022-11-10 DIAGNOSIS — B181 Chronic viral hepatitis B without delta-agent: Secondary | ICD-10-CM | POA: Diagnosis not present

## 2022-11-10 DIAGNOSIS — R161 Splenomegaly, not elsewhere classified: Secondary | ICD-10-CM | POA: Diagnosis not present

## 2022-11-10 DIAGNOSIS — K7460 Unspecified cirrhosis of liver: Secondary | ICD-10-CM | POA: Diagnosis not present

## 2022-11-10 MED ADMIN — gadobutrol 1 mmol/mL intravenous solution/syringe: .1 mL/kg | INTRAVENOUS | @ 16:00:00 | Stop: 2022-11-10

## 2022-11-15 ENCOUNTER — Other Ambulatory Visit: Payer: Self-pay | Admitting: "Endocrinology

## 2022-11-15 NOTE — Telephone Encounter (Signed)
Spoke with pt, he stated he would prefer not taking metformin and asked if you had any other suggestions to take the place of the jardiance.

## 2022-11-20 NOTE — Unmapped (Signed)
[facility] Pharm[facility] Refill Coordination Note    Nicholas [name], DOB: 06-Sep-1955  Phone: [phone] (home) [phone] (work)      All above HIPAA information was verified with patient.         11/17/2022     7:10 PM   Specialty Rx Medication Refill Questionnaire   Which Medications would you like refilled and shipped? Tenovir   Please list all current allergies: on file   Have you missed any doses in the last 30 days? No   Have you had any changes to your medication(s) since your last refill? No   How many days remaining of each medication do you have at home? 10   Have you experienced any side effects in the last 30 days? No   Please enter the full address (street address, city, state, zip code) where you would like your medication(s) to be delivered to. [address], [place], [place] [address]   Please specify on which day you would like your medication(s) to arrive. Note: if you need your medication(s) within 3 days, please call the pharmacy to schedule your order at [phone]  10/23/2022   Has your insurance changed since your last refill? No   Would you like a pharmacist to call you to discuss your medication(s)? No   Do you require a signature for your package? (Note: if we are billing Medicare Part B or your order contains a controlled substance, we will require a signature) No         Completed refill call assessment today to schedule patient's medication shipment from the [facility] [phone]).  All relevant notes have been reviewed.       Confirmed patient received a Conservation officer, historic buildings and a Surveyor, mining with first shipment. The patient will receive a drug information handout for each medication shipped and additional FDA Medication Guides as required.         REFERRAL TO PHARMACIST     Referral to the pharmacist: Not needed      [facility]     Shipping address confirmed in Epic.     Delivery Scheduled: Yes, Expected medication delivery date: 11/22/2022. Medication will be delivered via UPS to the prescription address in Epic WAM.    [name]   [facility] Specialty Technician

## 2022-11-21 MED FILL — TENOFOVIR DISOPROXIL FUMARATE 300 MG TABLET: ORAL | 30 days supply | Qty: 30 | Fill #1

## 2022-11-22 NOTE — Telephone Encounter (Signed)
Left a message requesting pt return call to the office. 

## 2022-11-23 NOTE — Telephone Encounter (Signed)
Tried to call pt, did not receive an answer and was unable to leave a message due to voicemail being full.

## 2022-11-23 NOTE — Telephone Encounter (Signed)
Discussed with pt, he stated he was interested in the weekly injection. Pt scheduled an appointment for July 31st at 2:00.

## 2022-11-28 ENCOUNTER — Encounter: Payer: Self-pay | Admitting: "Endocrinology

## 2022-11-30 ENCOUNTER — Telehealth
Admit: 2022-11-30 | Discharge: 2022-12-01 | Payer: MEDICARE | Attending: Student in an Organized Health Care Education/Training Program | Primary: Student in an Organized Health Care Education/Training Program

## 2022-11-30 DIAGNOSIS — F3130 Bipolar disorder, current episode depressed, mild or moderate severity, unspecified: Principal | ICD-10-CM

## 2022-11-30 DIAGNOSIS — Z79899 Other long term (current) drug therapy: Principal | ICD-10-CM

## 2022-11-30 MED ORDER — OLANZAPINE 5 MG TABLET
ORAL_TABLET | Freq: Every evening | ORAL | 1 refills | 30 days | Status: CP
Start: 2022-11-30 — End: 2023-01-29

## 2022-11-30 NOTE — Unmapped (Signed)
Follow-up instructions:  -- Please continue taking your medications as prescribed for your mental health.   -- Do not make changes to your medications, including taking more or less than prescribed, unless under the supervision of your physician. Be aware that some medications may make you feel worse if abruptly stopped  -- Please refrain from using illicit substances, as these can affect your mood and could cause anxiety or other concerning symptoms.   -- Seek further medical care for any increase in symptoms or new symptoms such as thoughts of wanting to hurt yourself or hurt others.     Contact info:  Life-threatening emergencies: call 911 or go to the nearest ER for medical or psychiatric attention.     Issues that need urgent attention but are not life threatening: call the clinic outpatient frontdesk at [phone] for assistance.     Non-urgent routine concerns, questions, and refill requests: please send me a mychart message and I will get back to you within 2 business days. If you prefer to call, please call the front desk at [phone].    Regarding appointments:  - If you need to cancel your appointment, we ask that you call [phone] at least 24 hours before your scheduled appointment.  - If for any reason you arrive 15 minutes later than your scheduled appointment time, you may not be seen and your visit may be rescheduled.  - Please remember that we will not automatically reschedule missed appointments.  - If you miss two (2) appointments without letting us know in advance, you will likely be referred to a provider in your community.  - We will do our best to be on time. Sometimes an emergency will arise that might cause your clinician to be late. We will try to inform you of this when you check in for your appointment. If you wait more than 15 minutes past your appointment time without such notice, please speak with the front desk staff.    In the event of bad weather, the clinic staff will attempt to contact you, should your appointment need to be rescheduled. Additionally, you can call the Patient Weather Line [phone] for system-wide clinic status    For more information and reminders regarding clinic policies (these were provided when you were admitted to the clinic), please ask the front desk.

## 2022-11-30 NOTE — Unmapped (Signed)
[facility]  Psychiatry   Established Patient E&M Service - Outpatient       Assessment:    Nicholas [name] presents for follow-up evaluation. Today, Mr. Layden is stable. Reports his mood has been better lately. He is taking medications consistently and tolerating them well. Continuing to have TD, but he feels it is improving. Does not find this bothersome/problematic. Continues to take B6 but unsure of efficacy. Still hoping to transition to Vraylar and will follow-up with assistance program today or tomorrow. Discussed need for updated lithium level. Recent Cr was wnl. Given stability, will plan to continue current medications. Would plan to stop Zyprexa if he gets approved for Vraylar. Will follow-up in ~1 month.    Identifying Information:  Nicholas [name] is a 67 y.o. male with a history of  Bipolar I Disorder that was diagnosed in 2003. He established with this clinic in September 2022. At that time, he was prescribed vraylar, lithium, doxepin, and xanax with fairly good efficacy and tolerability.     Risk Assessment:  A full psychiatric risk assessment was conducted on 01/31/21 and risks do not appear significantly changed from that visit.   While future psychiatric events cannot be accurately predicted, the patient does not currently require acute inpatient psychiatric care and does not currently meet [facility] involuntary commitment criteria.     Plan:    #Bipolar I Disorder:  --Continue Lithium ER 450 mg QAM, 900 mg at bedtime. Level of 0.9 in February 2024.   -- Continue Zyprexa 5 mg nightly.   --Plan to transition back to Vraylar if approved for assistance.  --Recommend taking 400 mg of B6 for TD (has been taking for ~3 months)  --Continue hydroxyzine 25 mg BID PRN.   --Continue Effexor 225 mg daily (s 4/11,i5/12, I5/22, i11/6/23)  --Previously discussed behavioral activation. Have previously discussed getting more plugged in with local community (maybe a church).  --Recommend discussing PT with PCP to help w/ his unsteadiness on feet/hoping this could increase activity level.  --Consider therapy, previously referred to resident CBT but patient never scheduled.     #Medication monitoring:   --Due for metabolic monitoring in February 2025  --Due for lithium labs in July 2024 (planning to monitor Q6 months given age). Ordered today.  --AIMS on 06/19/22 of 2 (scanned to media). Scored for movements in face, mouth, jaw, tongue. Had significant twitching in upper face, tongue movements (though not observed w/ opening but apparent throughout interview and with distraction), puckering. Patient also made frequent vocalizations which he reported no awareness of.    Lab Results   Component Value Date    Cholesterol 133 06/21/2022    LDL Calculated 82 06/21/2022    HDL 33 (L) 06/21/2022    Triglycerides 88 06/21/2022     Hemoglobin A1C:   Lab Results   Component Value Date    Hemoglobin A1C 6.8 01/12/2021     Lithium Lvl   Date Value Ref Range Status   06/21/2022 0.9 0.5 - 1.2 mmol/L Final     TSH   Date Value Ref Range Status   06/21/2022 2.489 0.550 - 4.780 uIU/mL Final     Creatinine Whole Blood, POC   Date Value Ref Range Status   10/01/2019 0.9 0.8 - 1.4 mg/dL Final   91/47/8295 0.9 0.8 - 1.4 mg/dL Final     Creatinine   Date Value Ref Range Status   10/24/2022 0.75 0.73 - 1.18 mg/dL Final   62/13/0865 7.84  0.76 - 1.27 mg/dL Final       Psychotherapy provided:  No billable psychotherapy service provided but brief supportive therapy was utilized.    Patient has been given this writer's contact information as well as the [facility] urgent line number. The patient has been instructed to call 911 for emergencies.    Subjective:    Interval History: Pt reports that he is doing well. States that he is staying in [place] until August 4th. States he is doing pretty well. States that he hasn't heard from [facility] about Well[facility] geologist. States he is going to call them today or tomorrow.     Sleep has been pretty good. Waking up ~2 times during the night. That is less than it was before. He is taking melatonin regularly, which helps a lot w/ falling asleep.     States that mood has been good. States that he has lost some weight lately. He states that he hasn't been very hungry and has been skipping lunches. States that he may go on ozempic for diabetes.     Denies any problems w/ his Zyprexa.     He feels that TD is a little better. States his husband doesn't think it is better.     States that he is going to move to [place]. They are going to stay there 4-5 days per week going forward.     Reviewed medications. Denies any recent missed doses. His husband helps him with his pill minder.     He did get a referral for a new GI at [facility].     Discussed POC (see assessment above for details). No other q's or concerns today.    Objective:    Mental Status Exam:  Appearance:    Appears stated age, Well nourished, Well developed and Clean/Neat   Motor:   Some twitching in upper face, blinking. No other abnormal movements noted on interview.   Speech/Language:    Normal rate, volume, tone, fluency and Language intact, well formed.    Mood:   Pretty good   Affect:   Euthymic   Thought process and Associations:   Logical, linear, clear, coherent, goal directed   Abnormal/psychotic thought content:    Some improvement in mood. Not endorsing SI.  Remains future oriented. Not voicing any bizarre or delusional thought content on interview.   Perceptual disturbances:      He is not endorsing any AVH. No RIS on interview.     Other:          I personally spent 27 minutes face-to-face and non-face-to-face in the care of this patient, which includes all pre, intra, and post visit time on the date of service.  All documented time was specific to the E/M visit and does not include any procedures that may have been performed.      The patient reports they are physically located in [place] and is currently: at home. I conducted a audio/video visit. I spent  15m 22s on the video call with the patient. I spent an additional 10 minutes on pre- and post-visit activities on the date of service .     Dennison Bulla, MD

## 2022-12-05 ENCOUNTER — Telehealth: Payer: Self-pay | Admitting: "Endocrinology

## 2022-12-05 NOTE — Telephone Encounter (Signed)
FYI  Spoke with pt concerning his novo nordisk application. Pt has an appt on 12/12/22. Notified him that his application is on hold until he is seen in office to possibly have Ozempic added. Will send eveything in at that time if given Rx for ozempic or another [place] nordisk product.

## 2022-12-05 NOTE — Telephone Encounter (Signed)
Noted  

## 2022-12-06 ENCOUNTER — Ambulatory Visit: Payer: Medicare HMO | Admitting: "Endocrinology

## 2022-12-12 ENCOUNTER — Ambulatory Visit: Payer: Medicare HMO | Admitting: "Endocrinology

## 2022-12-12 ENCOUNTER — Encounter: Payer: Self-pay | Admitting: "Endocrinology

## 2022-12-12 VITALS — BP 114/68 | HR 72 | Ht 70.0 in | Wt 259.0 lb

## 2022-12-12 DIAGNOSIS — E782 Mixed hyperlipidemia: Secondary | ICD-10-CM

## 2022-12-12 DIAGNOSIS — Z6836 Body mass index (BMI) 36.0-36.9, adult: Secondary | ICD-10-CM | POA: Diagnosis not present

## 2022-12-12 DIAGNOSIS — E559 Vitamin D deficiency, unspecified: Secondary | ICD-10-CM | POA: Diagnosis not present

## 2022-12-12 DIAGNOSIS — E1165 Type 2 diabetes mellitus with hyperglycemia: Secondary | ICD-10-CM

## 2022-12-12 DIAGNOSIS — Z7984 Long term (current) use of oral hypoglycemic drugs: Secondary | ICD-10-CM | POA: Diagnosis not present

## 2022-12-12 MED ORDER — OZEMPIC (0.25 OR 0.5 MG/DOSE) 2 MG/3ML ~~LOC~~ SOPN: 0.2500 mg | PEN_INJECTOR | SUBCUTANEOUS | 0 refills | Status: AC

## 2022-12-12 MED ORDER — OZEMPIC (0.25 OR 0.5 MG/DOSE) 2 MG/3ML ~~LOC~~ SOPN: 0.5000 mg | PEN_INJECTOR | SUBCUTANEOUS | 0 refills | Status: AC

## 2022-12-12 NOTE — Patient Instructions (Signed)

## 2022-12-12 NOTE — Progress Notes (Signed)
Endocrinology Follow Up Note       12/12/2022, 3:41 PM   Subjective:    Patient ID: Angel [name], male    DOB: 1955/12/15.  Angel [name] is being seen in follow up after being seen in consultation for management of currently uncontrolled symptomatic diabetes requested by  Esperanza Richters, PA-C.   Past Medical History:  Diagnosis Date   Bipolar disorder (HCC)    Cirrhosis of liver without mention of alcohol    Depression    GERD (gastroesophageal reflux disease)    Headache    Hepatitis B    Hepatitis E. 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 has lost 10 lbs since pre-visit, intentionally with diet and walking    Past Surgical History:  Procedure Laterality Date   APPENDECTOMY     COLONOSCOPY     POLYPECTOMY     UPPER GASTROINTESTINAL ENDOSCOPY      Social History   Socioeconomic History   Marital status: Single    Spouse name: [name]   Number of children: 0   Years of education: Not on file   Highest education level: Master's degree (e.g., MA, MS, MEng, MEd, MSW, MBA)  Occupational History   Occupation: retired    Associate Professor: NOT EMPLOYED  Tobacco Use   Smoking status: Former    Current packs/day: 0.00    Types: Cigarettes    Quit date: 05/08/2009    Years since quitting: 13.6   Smokeless tobacco: Never  Vaping Use   Vaping status: Never Used  Substance and Sexual Activity   Alcohol use: No    Alcohol/week: 0.0 standard drinks of alcohol   Drug use: No   Sexual activity: Not Currently  Other Topics Concern   Not on file  Social History Narrative   Patient is right-handed. He is married to same sex partner. He gets little exercise.   Caffeine: 4 glasses of tea max/day   Social Determinants of Health   Financial  Resource Strain: Not on file  Food Insecurity: Not on file  Transportation Needs: Not on file  Physical Activity: Not on file  Stress: Not on file  Social Connections: Not on file    Family History  Problem Relation Age of Onset   Heart disease Mother    Diabetes Father    Kidney failure Father    Kidney disease Father    Other Sister        GERD   Colon cancer Neg Hx    Rectal cancer Neg Hx    Stomach cancer Neg Hx    Esophageal cancer Neg Hx    Headache Neg Hx    Migraines Neg Hx     Outpatient Encounter Medications as of 12/12/2022  Medication Sig   Semaglutide,0.25 or 0.5MG /DOS, (OZEMPIC, 0.25 OR 0.5 MG/DOSE,) 2 MG/3ML SOPN Inject 0.25 mg into the skin once a week.   Semaglutide,0.25 or 0.5MG /DOS, (OZEMPIC, 0.25 OR 0.5 MG/DOSE,) 2 MG/3ML SOPN Inject 0.5 mg into  the skin once a week.   Blood Glucose Monitoring Suppl (ACCU-CHEK AVIVA PLUS) w/Device KIT Check sugar TID . Dx code:R73.9   diphenoxylate-atropine (LOMOTIL) 2.5-0.025 MG tablet Take 1 tablet by mouth 4 (four) times daily as needed for diarrhea or loose stools.   empagliflozin (JARDIANCE) 10 MG TABS tablet Take 1 tablet (10 mg total) by mouth daily before breakfast. (Patient not taking: Reported on 12/12/2022)   glipiZIDE (GLUCOTROL XL) 5 MG 24 hr tablet TAKE 1 TABLET BY MOUTH EVERY MORNING WITH BREAKFAST   glucose blood (ONETOUCH VERIO) test strip USE TO CHECK BLOOD SUGAR BEFORE BREAKFAST AND BEFORE AT BEDTIME   Lancet Devices (LANCING DEVICE) MISC 1 Device by Does not apply route as directed. Compatible with onetouch ultrasoft lancets   Lancets (ONETOUCH DELICA PLUS LANCET33G) MISC USE AS INSTRUCTED TO MONITOR GLUCOSE TWICE DAILY   lithium carbonate (ESKALITH) 450 MG CR tablet Take 1,400 mg by mouth daily.   Melatonin 10 MG SUBL Place 10 mg under the tongue at bedtime.   nadolol (CORGARD) 20 MG tablet Take 20 mg by mouth daily.   Pancrelipase, Lip-Prot-Amyl, (CREON PO) Take by mouth. 21000 units with meals and snacks    tenofovir (VIREAD) 300 MG tablet Take 1 tablet by mouth daily.   No facility-administered encounter medications on file as of 12/12/2022.    ALLERGIES: Allergies  Allergen Reactions   Aripiprazole Other (See Comments)    Tardive Dyskinesia   Aripiprazole     Other reaction(s): Other (See Comments) Tardive Dyskinesia   Invega [Paliperidone Er] Other (See Comments)    Tardive dyskinsia symptoms   Quetiapine Diarrhea, Hives, Itching, Nausea And Vomiting, Nausea Only, Shortness Of Breath and Rash   Eszopiclone Other (See Comments)    Sleep walking   Escitalopram     Other reaction(s): Other (See Comments) Doesn't Work   Hydrocodone Itching   Hydrocodone Bit-Homatrop Mbr Rash   Lamictal [Lamotrigine] Rash   Lexapro [Escitalopram Oxalate] Other (See Comments)    Doesn't Work    VACCINATION STATUS: Immunization History  Administered Date(s) Administered   Influenza Split 04/11/2011   Influenza,inj,Quad PF,6+ Mos 04/09/2014, 01/19/2015, 03/27/2016, 02/27/2017, 03/14/2018, 01/02/2019, 02/26/2020   Influenza,inj,quad, With Preservative 03/21/2013   Influenza,trivalent, recombinat, inj, PF 04/11/2011   Pneumococcal Polysaccharide-23 03/21/2013   Tdap 10/07/2013   Zoster Recombinant(Shingrix) 01/02/2019, 03/04/2019   Zoster, Live 08/12/2015    Diabetes He presents for his follow-up diabetic visit. He has type 2 diabetes mellitus. The initial diagnosis of diabetes was made 2 months (Diagnosed approx 2 months ago) ago. His disease course has been worsening. There are no hypoglycemic associated symptoms. Associated symptoms include foot paresthesias. Pertinent negatives for diabetes include no blurred vision, no fatigue, no foot ulcerations, no polydipsia, no polyuria and no weight loss. There are no hypoglycemic complications. Symptoms are worsening. Diabetic complications include nephropathy and peripheral neuropathy. Risk factors for coronary artery disease include diabetes mellitus,  dyslipidemia, family history, male sex, obesity and sedentary lifestyle. Current diabetic treatment includes diet and oral agent (monotherapy). He is compliant with treatment most of the time. His weight is decreasing steadily. He is following a generally unhealthy diet. When asked about meal planning, he reported none. He participates in exercise intermittently. His home blood glucose trend is increasing steadily. (He presents today without his meter or logs.  His recent A1c was 7.2% increasing from 6.6% . he denies hypoglycemia.   Patient is only on glipizide 5 mg p.o. daily at breakfast.  He states that she has approval  for Ozempic patient assistance program and would like to consider this medication. ) An ACE inhibitor/angiotensin II receptor blocker is not being taken. He does not see a podiatrist.Eye exam is current.  Hyperlipidemia This is a chronic problem. The current episode started more than 1 year ago. The problem is uncontrolled. Recent lipid tests were reviewed and are variable. Exacerbating diseases include chronic renal disease, diabetes, liver disease and obesity. Factors aggravating his hyperlipidemia include beta blockers and fatty foods. He is currently on no antihyperlipidemic treatment. Compliance problems include adherence to exercise.  Risk factors for coronary artery disease include diabetes mellitus, dyslipidemia, family history, obesity, male sex and a sedentary lifestyle.    Review of systems  Constitutional: + slow increase in body weight,  current Body mass index is 37.16 kg/m. , no fatigue, no subjective hyperthermia, no subjective hypothermia   Objective:     BP 114/68   Pulse 72   Ht 5\' 10"  (1.778 m)   Wt 259 lb (117.5 kg)   BMI 37.16 kg/m   Wt Readings from Last 3 Encounters:  12/12/22 259 lb (117.5 kg)  11/01/22 255 lb 9.6 oz (115.9 kg)  11/29/21 270 lb 12.8 oz (122.8 kg)     BP Readings from Last 3 Encounters:  12/12/22 114/68  11/01/22 104/68   03/02/22 110/62      Physical Exam- Limited  Constitutional:  Body mass index is 37.16 kg/m. , not in acute distress, normal state of mind Eyes:  EOMI, no exophthalmos Neck: Supple     CMP ( most recent) CMP     Component Value Date/Time   NA 139 02/28/2022 1057   K 4.1 02/28/2022 1057   CL 102 02/28/2022 1057   CO2 22 02/28/2022 1057   GLUCOSE 250 (H) 02/28/2022 1057   GLUCOSE 119 (H) 10/06/2020 1552   BUN 9 02/28/2022 1057   CREATININE 0.98 02/28/2022 1057   CREATININE 1.00 03/10/2020 1011   CALCIUM 8.6 02/28/2022 1057   PROT 6.0 02/28/2022 1057   ALBUMIN 3.7 (L) 02/28/2022 1057   AST 25 02/28/2022 1057   ALT 39 02/28/2022 1057   ALKPHOS 56 02/28/2022 1057   BILITOT 0.5 02/28/2022 1057   GFRNONAA 94 05/06/2019 1324   GFRNONAA 92 02/09/2017 1527   GFRAA 109 05/06/2019 1324   GFRAA 106 02/09/2017 1527     Diabetic Labs (most recent): Lab Results  Component Value Date   HGBA1C 7.2 (A) 11/01/2022   HGBA1C 6.6 03/02/2022   HGBA1C 6.6 11/29/2021   MICROALBUR 10 01/12/2021   MICROALBUR 0.4 02/10/2020     Lipid Panel ( most recent) Lipid Panel     Component Value Date/Time   CHOL 126 02/28/2022 1057   TRIG 162 (H) 02/28/2022 1057   HDL 30 (L) 02/28/2022 1057   CHOLHDL 4.2 02/28/2022 1057   CHOLHDL 5.4 (H) 02/10/2020 1054   VLDL 32.8 09/28/2016 1659   LDLCALC 68 02/28/2022 1057   LDLCALC 93 02/10/2020 1054   LABVLDL 28 02/28/2022 1057      Lab Results  Component Value Date   TSH 2.980 02/28/2022   TSH 3.360 05/16/2021   TSH 0.72 11/28/2019   TSH 3.160 05/06/2019   TSH 2.43 10/11/2017   TSH 3.955 04/22/2015   TSH 2.87 10/07/2013   FREET4 1.01 02/28/2022   FREET4 0.91 05/16/2021      Assessment & Plan:   1) Uncontrolled Type 2 Diabetes with hyperglycemia He presents today accompanied by his partner with continued improvement in his glycemic  profile.    He presents today without his meter or logs.  His recent A1c was 7.2% increasing from  6.6% . he denies hypoglycemia.   Patient is only on glipizide 5 mg p.o. daily at breakfast.  He states that she has approval for Ozempic patient assistance program and would like to consider this medication.  - Jaymeson Reedus has currently uncontrolled symptomatic type 2 DM since 66 years of age.  -Recent labs reviewed.  - I had a long discussion with him about the progressive nature of diabetes and the pathology behind its complications. -his diabetes is complicated by peripheral neuropathy and mild CKD and he remains at a high risk for more acute and chronic complications which include CAD, CVA, and retinopathy. These are all discussed in detail with him.  He will continue to benefit from lifestyle medicine.  - he acknowledges that there is a room for improvement in his food and drink choices. - Suggestion is made for him to avoid simple carbohydrates  from his diet including Cakes, Sweet Desserts, Ice Cream, Soda (diet and regular), Sweet Tea, Candies, Chips, Cookies, Store Bought Juices, Alcohol , Artificial Sweeteners,  Coffee Creamer, and "Sugar-free" Products, Lemonade. This will help patient to have more stable blood glucose profile and potentially avoid unintended weight gain.  The following Lifestyle Medicine recommendations according to American College of Lifestyle Medicine  [facility]) were discussed and and offered to patient and he  agrees to start the journey:  A. Whole Foods, Plant-Based Nutrition comprising of fruits and vegetables, plant-based proteins, whole-grain carbohydrates was discussed in detail with the patient.   A list for source of those nutrients were also provided to the patient.  Patient will use only water or unsweetened tea for hydration. B.  The need to stay away from risky substances including alcohol, smoking; obtaining 7 to 9 hours of restorative sleep, at least 150 minutes of moderate intensity exercise weekly, the importance of healthy social connections,  and  stress management techniques were discussed. C.  A full color page of  Calorie density of various food groups per pound showing examples of each food groups was provided to the patient.   - he has been scheduled with Norm Salt, RDE for diabetes education.  - I have approached him with the following individualized plan to manage  his diabetes and patient agrees:   -In light of his increasing A1c and above target glycemic profile, he would benefit from additional intervention besides his glipizide.  His insurance did not provide coverage for Jardiance but I discussed and initiated Ozempic 0.25 mg subcutaneously weekly to advance if tolerated. -He is advised to continue glipizide 5 mg XL p.o. daily at breakfast.   -He is encouraged to continue monitoring blood glucose twice daily, before breakfast and before bed, and to call the clinic if he has readings less than 70 or greater than 200 for 3 tests in a row.  - Specific targets for  A1c;  LDL, HDL,  and Triglycerides were discussed with the patient.  2) Blood Pressure /Hypertension: -His blood pressure is controlled currently.  he is advised to continue his current medications including Nadolol 20 mg p.o. daily with breakfast.  3) Lipids/Hyperlipidemia:    Review of his recent lipid panel showed controlled LDL at 68 overall improving.  He is on statins.    He has nonalcoholic liver disease , WFPB diet will help with his liver.   4)  Weight/Diet:  his Body mass index is 37.16  kg/m.  -  clearly complicating his diabetes care.   he is a candidate for weight loss. I discussed with him the fact that loss of 5 - 10% of his  current body weight will have the most impact on his diabetes management.  Exercise, and detailed carbohydrates information provided  -  detailed on discharge instructions.  5) Chronic Care/Health Maintenance: -he not on ACEI/ARB or Statin medications and is encouraged to initiate and continue to follow up with  Ophthalmology, Dentist, Podiatrist at least yearly or according to recommendations, and advised to stay away from smoking. I have recommended yearly flu vaccine and pneumonia vaccine at least every 5 years; moderate intensity exercise for up to 150 minutes weekly; and sleep for at least 7 hours a day.  - he is advised to maintain close follow up with S[place]ier, Ramon Dredge, PA-C for primary care needs, as well as his other providers for optimal and coordinated care.   I spent  25  minutes in the care of the patient today including review of labs from CMP, Lipids, Thyroid Function, Hematology (current and previous including abstractions from other facilities); face-to-face time discussing  his blood glucose readings/logs, discussing hypoglycemia and hyperglycemia episodes and symptoms, medications doses, his options of short and long term treatment based on the latest standards of care / guidelines;  discussion about incorporating lifestyle medicine;  and documenting the encounter. Risk reduction counseling performed per USPSTF guidelines to reduce  obesity and cardiovascular risk factors.     Please refer to Patient Instructions for Blood Glucose Monitoring and Insulin/Medications Dosing Guide"  in media tab for additional information. Please  also refer to " Patient Self Inventory" in the Media  tab for reviewed elements of pertinent patient history.  Deirdre Peer participated in the discussions, expressed understanding, and voiced agreement with the above plans.  All questions were answered to his satisfaction. he is encouraged to contact clinic should he have any questions or concerns prior to his return visit.   Follow up plan: - Return in about 4 months (around 04/13/2023) for F/U with Pre-visit Labs, Meter/CGM/Logs, A1c here.   Ronny Bacon, [facility] [address] [place], [place] [address] Phone: [phone] Fax: [phone]  12/12/2022, 3:41 PM

## 2022-12-14 NOTE — Unmapped (Signed)
Called Mr. Carlberg at 10 AM today and left VM. Called again at 4:30 PM and left VM. Attempting to follow-up on message regarding sx of mania.

## 2022-12-15 NOTE — Unmapped (Signed)
Reached out to Mr. Mcclements d/t his message about concern for mania.Spoke by phone for ~8 minutes today. His dog died unexpectedly. He states that his pupils are dilated. States that can happen when he is manic. States that he has been thinking about things that were twenty years ago, thinking about trying to fix them. Things that went wrong. Sleeping ~4 hrs. States that is lower than his usual 7 hrs. He is not spending excessively. [name] is concerned about his grunting. Typically when he is manic he spends a lot, talks quickly. He states when he is manic he won't finish one thought before moving onto the second. These signs are not occurring right now. We discussed increasing Zyprexa and lowering effexor dose. Will plan to have him take 7.5 mg Zyprexa, 150 mg effexor. He was amenable. Will want to monitor for any worsening TD. Likely would want to decrease back to baseline Zyprexa dose once mood is more stable. Notably, patient does not have pressured speech or any thought disorganization during our phone call. We are going to check in next week. No acute safety concerns today.

## 2022-12-20 ENCOUNTER — Telehealth
Admit: 2022-12-20 | Discharge: 2022-12-21 | Payer: MEDICARE | Attending: Student in an Organized Health Care Education/Training Program | Primary: Student in an Organized Health Care Education/Training Program

## 2022-12-20 DIAGNOSIS — F310 Bipolar disorder, current episode hypomanic: Principal | ICD-10-CM

## 2022-12-20 MED FILL — TENOFOVIR DISOPROXIL FUMARATE 300 MG TABLET: ORAL | 30 days supply | Qty: 30 | Fill #2

## 2022-12-20 NOTE — Unmapped (Signed)
[facility]  Psychiatry   Established Patient E&M Service - Outpatient       Assessment:    Nicholas [name] presents for follow-up evaluation. Today, Nicholas [name] is reporting return to his baseline. He states he is no longer feeling manic. Slept well last night. No concerns for impulsive behaviors including excessive spending. He has responded to med changes and denies any SE at this time. He does not think TD is worsened on higher Zyprexa dose.Typically he will have depression sx following resolution of mania. Currently mood is OK. For now, we discussed continuing medication as currently prescribed. We are planning to follow-up in ~2 weeks. Nicholas [name] is agreeable to reaching out prior to that visit if symptoms worsen or other concerns arise.    Identifying Information:  Nicholas [name] is a 67 y.o. male with a history of  Bipolar I Disorder that was diagnosed in 2003. He established with this clinic in September 2022. At that time, he was prescribed vraylar, lithium, doxepin, and xanax with fairly good efficacy and tolerability.     Risk Assessment:  A full psychiatric risk assessment was conducted on 01/31/21 and risks do not appear significantly changed from that visit.   While future psychiatric events cannot be accurately predicted, the patient does not currently require acute inpatient psychiatric care and does not currently meet [facility] involuntary commitment criteria.     Plan:    #Bipolar I Disorder:  --Continue Lithium ER 450 mg QAM, 900 mg at bedtime. Level of 0.9 in February 2024.   -- Continue Zyprexa 7.5 mg nightly (increased 8/9 d/t concern for mania). Could consider decreasing after a period of stability.  --Consider transition back to Vraylar if patient is approved for assistance.  --Recommend taking 400 mg of B6 for TD (has been taking for ~4 months)  --Continue hydroxyzine 25 mg BID PRN.   --Continue Effexor 150 mg daily (s 4/11,i5/12, I5/22, i11/6/23, d8/9/24). Previously had a long period without mania on 225 mg dose (and possibly had more control of depression sx).  --Previously discussed behavioral activation. Have previously discussed getting more plugged in with local community (maybe a church).  --Recommend discussing PT with PCP to help w/ his unsteadiness on feet/hoping this could increase activity level.  --Consider therapy, previously referred to resident CBT but patient never scheduled.     #Medication monitoring:   --Due for metabolic monitoring in February 2025  --Due for lithium labs in July 2024 (planning to monitor Q6 months given age). Ordered @ last visit.  --AIMS on 06/19/22 of 2 (scanned to media). Scored for movements in face, mouth, jaw, tongue. Had significant twitching in upper face, tongue movements (though not observed w/ opening but apparent throughout interview and with distraction), puckering. Patient also made frequent vocalizations which he reported no awareness of.    Lab Results   Component Value Date    Cholesterol 133 06/21/2022    LDL Calculated 82 06/21/2022    HDL 33 (L) 06/21/2022    Triglycerides 88 06/21/2022     Hemoglobin A1C:   Lab Results   Component Value Date    Hemoglobin A1C 6.8 01/12/2021     Lithium Lvl   Date Value Ref Range Status   06/21/2022 0.9 0.5 - 1.2 mmol/L Final     TSH   Date Value Ref Range Status   06/21/2022 2.489 0.550 - 4.780 uIU/mL Final     Creatinine Whole Blood, POC   Date Value Ref Range  Status   10/01/2019 0.9 0.8 - 1.4 mg/dL Final   44/05/270 0.9 0.8 - 1.4 mg/dL Final     Creatinine   Date Value Ref Range Status   10/24/2022 0.75 0.73 - 1.18 mg/dL Final   53/66/4403 4.74 0.76 - 1.27 mg/dL Final       Psychotherapy provided:  No billable psychotherapy service provided.    Patient has been given this writer's contact information as well as the [facility] Psychiatry urgent line number. The patient has been instructed to call 911 for emergencies.    Subjective:    Interval History:  Pt reports that he is feeling better. Nicholas [name] thinks he isn't manic anymore. States I do too. Reports he slept well overnight. Only woke up 1x and got 7 hrs total. Denies racing thoughts. States it seems to all have dissipated. He is feeling much better. Reviewed medications. He is tolerating those well. Denies any symptoms that are concerning for mania. Denies any excess spending.    He reports that his mood is OK right now.     He doesn't feel like TD is any worse on higher dose of Zyprexa.     He is OK to meet in 2 weeks.     He is going to see his sister this weekend. He is afraid to leave his dog at home given the death of his other dog.     Discussed POC (See assessment above for details). No other Q's or concerns today.    Objective:    Mental Status Exam:  Appearance:    Appears stated age, Well nourished, Well developed and Clean/Neat   Motor:   Some twitching in upper face, blinking. +mouth movements. No other abnormal movements noted on interview.   Speech/Language:    Normal rate, volume, tone, fluency and Language intact, well formed.    Mood:   Better   Affect:   Euthymic, somewhat tired appearing.   Thought process and Associations:   Logical, linear, clear, coherent, goal directed   Abnormal/psychotic thought content:    Not endorsing SI.  Remains future oriented. Not voicing any bizarre or delusional thought content on interview.   Perceptual disturbances:      He is not endorsing any AVH. No RIS on interview.     Other:            The patient reports they are physically located in [place] and is currently: at home. I conducted a audio/video visit. I spent  52m 39s on the video call with the patient. I spent an additional 10 minutes on pre- and post-visit activities on the date of service .     Dennison Bulla, MD

## 2022-12-20 NOTE — Unmapped (Signed)
Follow-up instructions:  --Continue Zyprexa 7.5 mg nightly  --Continue Effexor 225 mg daily  --Please get lithium labs drawn.  -- Please continue taking your medications as prescribed for your mental health.   -- Do not make changes to your medications, including taking more or less than prescribed, unless under the supervision of your physician. Be aware that some medications may make you feel worse if abruptly stopped  -- Please refrain from using illicit substances, as these can affect your mood and could cause anxiety or other concerning symptoms.   -- Seek further medical care for any increase in symptoms or new symptoms such as thoughts of wanting to hurt yourself or hurt others.     Contact info:  Life-threatening emergencies: call 911 or go to the nearest ER for medical or psychiatric attention.     Issues that need urgent attention but are not life threatening: call the clinic outpatient frontdesk at [phone] for assistance.     Non-urgent routine concerns, questions, and refill requests: please send me a mychart message and I will get back to you within 2 business days. If you prefer to call, please call the front desk at [phone].    Regarding appointments:  - If you need to cancel your appointment, we ask that you call [phone] at least 24 hours before your scheduled appointment.  - If for any reason you arrive 15 minutes later than your scheduled appointment time, you may not be seen and your visit may be rescheduled.  - Please remember that we will not automatically reschedule missed appointments.  - If you miss two (2) appointments without letting us know in advance, you will likely be referred to a provider in your community.  - We will do our best to be on time. Sometimes an emergency will arise that might cause your clinician to be late. We will try to inform you of this when you check in for your appointment. If you wait more than 15 minutes past your appointment time without such notice, please speak with the front desk staff.    In the event of bad weather, the clinic staff will attempt to contact you, should your appointment need to be rescheduled. Additionally, you can call the Patient Weather Line [phone] for system-wide clinic status    For more information and reminders regarding clinic policies (these were provided when you were admitted to the clinic), please ask the front desk.

## 2022-12-22 MED ORDER — OLANZAPINE 5 MG TABLET
ORAL_TABLET | Freq: Every evening | ORAL | 0 refills | 30 days | Status: CP
Start: 2022-12-22 — End: 2023-01-21

## 2022-12-26 ENCOUNTER — Telehealth: Payer: Self-pay | Admitting: "Endocrinology

## 2022-12-26 NOTE — Telephone Encounter (Signed)
Called pt to let him know that his Ozempic is here at the office. No answer. VM full

## 2022-12-27 ENCOUNTER — Telehealth: Payer: Self-pay

## 2022-12-27 NOTE — Telephone Encounter (Signed)
Tried to return a call to pt but did not receive an answer and was unable to leave a message.

## 2022-12-28 NOTE — Telephone Encounter (Signed)
Left a message requesting pt return call to the office. 

## 2022-12-28 NOTE — Telephone Encounter (Signed)
Pt made aware his Ozempic through pt assistance arrived at office and is ready for him to pick up. Pt voiced understanding.

## 2023-01-01 ENCOUNTER — Telehealth
Admit: 2023-01-01 | Discharge: 2023-01-02 | Payer: MEDICARE | Attending: Student in an Organized Health Care Education/Training Program | Primary: Student in an Organized Health Care Education/Training Program

## 2023-01-01 DIAGNOSIS — F310 Bipolar disorder, current episode hypomanic: Principal | ICD-10-CM

## 2023-01-01 DIAGNOSIS — Z79899 Other long term (current) drug therapy: Principal | ICD-10-CM

## 2023-01-01 NOTE — Unmapped (Signed)
Follow-up instructions:  --Continue Zyprexa 7.5 mg nightly for now  --Please have lithium labs obtained  -- Please continue taking your medications as prescribed for your mental health.   -- Do not make changes to your medications, including taking more or less than prescribed, unless under the supervision of your physician. Be aware that some medications may make you feel worse if abruptly stopped  -- Please refrain from using illicit substances, as these can affect your mood and could cause anxiety or other concerning symptoms.   -- Seek further medical care for any increase in symptoms or new symptoms such as thoughts of wanting to hurt yourself or hurt others.     Contact info:  Life-threatening emergencies: call 911 or go to the nearest ER for medical or psychiatric attention.     Issues that need urgent attention but are not life threatening: call the clinic outpatient frontdesk at [phone] for assistance.     Non-urgent routine concerns, questions, and refill requests: please send me a mychart message and I will get back to you within 2 business days. If you prefer to call, please call the front desk at [phone].    Regarding appointments:  - If you need to cancel your appointment, we ask that you call [phone] at least 24 hours before your scheduled appointment.  - If for any reason you arrive 15 minutes later than your scheduled appointment time, you may not be seen and your visit may be rescheduled.  - Please remember that we will not automatically reschedule missed appointments.  - If you miss two (2) appointments without letting us know in advance, you will likely be referred to a provider in your community.  - We will do our best to be on time. Sometimes an emergency will arise that might cause your clinician to be late. We will try to inform you of this when you check in for your appointment. If you wait more than 15 minutes past your appointment time without such notice, please speak with the front desk staff.    In the event of bad weather, the clinic staff will attempt to contact you, should your appointment need to be rescheduled. Additionally, you can call the Patient Weather Line [phone] for system-wide clinic status    For more information and reminders regarding clinic policies (these were provided when you were admitted to the clinic), please ask the front desk.

## 2023-01-01 NOTE — Unmapped (Signed)
[facility]  Psychiatry   Established Patient E&M Service - Outpatient       Assessment:    Nicholas [name] presents for follow-up evaluation. Today, Nicholas [name] reports he is doing well overall. He reports that he has not had any mania or hypomania symptoms for about 1 week. He did continue to have some intermittent sleep disturbance/decreased need for sleep. and racing thoughts after our last visit. He is tolerating higher Zyprexa dose without issue. He reports that his mood is stable and he is noticing any depressed mood at this time. He reports TD is stable. I did notice vocalizations during our visit. He reports that these are not worse than prior, but have not improved either. He has 2 new puppies after both his older dogs died. He notes that having new puppies has lead to him being more active which seems to be a good thing. Given relatively recently mania/hypomania symptoms and good medication tolerability we discussed continuing on current regimen for now. Ultimately would like to reduce Zyprexa dose if it is safe, but will defer for now given recent sx. He is continuing to work on Paediatric nurse approved. Will plan to switch to Vraylar if he is approved. Did remind him about need for updated lithium labs. We are planning to follow-up in 3 weeks.      Identifying Information:  Nicholas [name] is a 67 y.o. male with a history of  Bipolar I Disorder that was diagnosed in 2003. He established with this clinic in September 2022. At that time, he was prescribed vraylar, lithium, doxepin, and xanax with fairly good efficacy and tolerability.     Risk Assessment:  A full psychiatric risk assessment was conducted on 01/31/21 and risks do not appear significantly changed from that visit.   While future psychiatric events cannot be accurately predicted, the patient does not currently require acute inpatient psychiatric care and does not currently meet [facility] involuntary commitment criteria.     Plan:    #Bipolar I Disorder:  --Continue Lithium ER 450 mg QAM, 900 mg at bedtime. Level of 0.9 in February 2024.   -- Continue Zyprexa 7.5 mg nightly (increased 8/9 d/t concern for mania). Could consider decreasing after a period of stability.  --Consider transition back to Vraylar if patient is approved for assistance.  --Recommend taking 400 mg of B6 for TD  --Continue hydroxyzine 25 mg BID PRN.   --Continue Effexor 150 mg daily (s 4/11,i5/12, I5/22, i11/6/23, d8/9/24). Previously had a long period without mania on 225 mg dose (and possibly had more control of depression sx).  --Previously discussed behavioral activation. Have previously discussed getting more plugged in with local community (maybe a church).  --Recommend discussing PT with PCP to help w/ his unsteadiness on feet/hoping this could increase activity level.  --Consider therapy, previously referred to resident CBT but patient never scheduled.     #Medication monitoring:   --Due for metabolic monitoring in February 2025  --Due for lithium labs in July 2024 (planning to monitor Q6 months given age). Labs ordered. Pt reminded today.  --AIMS on 06/19/22 of 2 (scanned to media). Scored for movements in face, mouth, jaw, tongue. Had significant twitching in upper face, tongue movements (though not observed w/ opening but apparent throughout interview and with distraction), puckering. Patient also made frequent vocalizations which he reported no awareness of.    Lab Results   Component Value Date    Cholesterol 133 06/21/2022  LDL Calculated 82 06/21/2022    HDL 33 (L) 06/21/2022    Triglycerides 88 06/21/2022     Hemoglobin A1C:   Lab Results   Component Value Date    Hemoglobin A1C 6.8 01/12/2021     Lithium Lvl   Date Value Ref Range Status   06/21/2022 0.9 0.5 - 1.2 mmol/L Final     TSH   Date Value Ref Range Status   06/21/2022 2.489 0.550 - 4.780 uIU/mL Final     Creatinine Whole Blood, POC   Date Value Ref Range Status 10/01/2019 0.9 0.8 - 1.4 mg/dL Final   16/02/9603 0.9 0.8 - 1.4 mg/dL Final     Creatinine   Date Value Ref Range Status   10/24/2022 0.75 0.73 - 1.18 mg/dL Final   54/01/8118 1.47 0.76 - 1.27 mg/dL Final       Psychotherapy provided:  No billable psychotherapy service provided.    Patient has been given this writer's contact information as well as the [facility] Psychiatry urgent line number. The patient has been instructed to call 911 for emergencies.    Subjective:    Interval History:  Pt reports that he is doing pretty well. States that mania symptoms have resolved. He had a stomach virus this week. He is feeling better today.    He is taking 7.5 mg Zyprexa, 150 mg effexor.    States that hypomania have gone away. He has not noticed any symptoms for about 1 week. He is sleeping right now. States that his sx were dilated eyes and racing thoughts, not sleeping well. I'd like sleep like 2 hrs and that would be enough.    He reports his mood is pretty good. States that he has 2 new puppies [place] and [place]). The new dogs are medium sized dogs and are mixed breed. His other dog died since we last met. The puppies are 1 months old.    Denies feeling down recently. He reports that he is grieving the loss of his 2 dogs.     He is starting on Ozempic soon. He is concerned about effects on mood.     He reports that TD seems to be decreased recently. Notes that he is continuing to notice grunting noises. Grunting is not worse, but isn't improving.    He continues to take B6.     Reminded of need for lithium labs.     Discussed POC (see assessment above for details). No other Q's or concerns today.  Objective:    Mental Status Exam:  Appearance:    Appears stated age, Well nourished, Well developed and Clean/Neat   Motor:   Some twitching in upper face, blinking. +mouth movements. No other abnormal movements noted on interview.   Speech/Language:    Normal rate, volume, tone, fluency and Language intact, well formed. Some involuntary vocalizations during visit today.   Mood:   OK   Affect:   Euthymic   Thought process and Associations:   Logical, linear, clear, coherent, goal directed   Abnormal/psychotic thought content:    Not endorsing SI.  Remains future oriented. Not voicing any bizarre or delusional thought content on interview.   Perceptual disturbances:      He is not endorsing any AVH. No RIS on interview.     Other:          I personally spent 33 minutes face-to-face and non-face-to-face in the care of this patient, which includes all pre, intra, and post visit time on  the date of service.  All documented time was specific to the E/M visit and does not include any procedures that may have been performed.        The patient reports they are physically located in [place] and is currently: at home. I conducted a audio/video visit. I spent  63m 34s on the video call with the patient. I spent an additional 10 minutes on pre- and post-visit activities on the date of service .     Dennison Bulla, MD

## 2023-01-04 NOTE — Unmapped (Signed)
[facility] Refill Coordination Note    Specialty Medication(s) to be Shipped:   Infectious Disease: tenofovir disoproxil fumarate    Other medication(s) to be shipped: No additional medications requested for fill at this time     Nicholas [name], DOB: Jul 25, 1955  Phone: [phone] (home) [phone] (work)      All above HIPAA information was verified with patient.     Was a Nurse, learning disability used for this call? No    Completed refill call assessment today to schedule patient's medication shipment from the [facility] [phone]).  All relevant notes have been reviewed.     Specialty medication(s) and dose(s) confirmed: Regimen is correct and unchanged.   Changes to medications: Chambers reports no changes at this time.  Changes to insurance: No  New side effects reported not previously addressed with a pharmacist or physician: None reported  Questions for the pharmacist: No    Confirmed patient received a Conservation officer, historic buildings and a Surveyor, mining with first shipment. The patient will receive a drug information handout for each medication shipped and additional FDA Medication Guides as required.       DISEASE/MEDICATION-SPECIFIC INFORMATION        N/A    SPECIALTY MEDICATION ADHERENCE     Medication Adherence    Patient reported X missed doses in the last month: 0  Specialty Medication: tenofovir disoproxil 300 mg tablet (VIREAD)  Patient is on additional specialty medications: No  Patient is on more than two specialty medications: No  Any gaps in refill history greater than 2 weeks in the last 3 months: no  Demonstrates understanding of importance of adherence: yes  Informant: patient  Confirmed plan for next specialty medication refill: delivery by pharmacy  Refills needed for supportive medications: not needed          Refill Coordination    Has the Patients' Contact Information Changed: No  Is the Shipping Address Different: No         Were doses missed due to medication being on hold? No    tenofovir disoproxil 300   mg: 7 doses of medicine on hand       REFERRAL TO PHARMACIST     Referral to the pharmacist: Not needed      SHIPPING     Shipping address confirmed in Epic.       Delivery Scheduled: Yes, Expected medication delivery date: 12/21/2022.     Medication will be delivered via UPS to the prescription address in Epic WAM.    [name]   [facility] Specialty Technician

## 2023-01-11 NOTE — Unmapped (Signed)
[facility] Refill Coordination Note    Nicholas [name], DOB: 1955-10-29  Phone: [phone] (home) [phone] (work)      All above HIPAA information was verified with patient.         01/10/2023     4:11 PM   Specialty Rx Medication Refill Questionnaire   Which Medications would you like refilled and shipped? Generic Vemlidy viread   Please list all current allergies: on file   Have you missed any doses in the last 30 days? No   Have you had any changes to your medication(s) since your last refill? Yes   Please list your medication(s) changes below. Ozempic   How many days remaining of each medication do you have at home? 10   If receiving an injectable medication, next injection date is 01/16/2023   Have you experienced any side effects in the last 30 days? No   Please enter the full address (street address, city, state, zip code) where you would like your medication(s) to be delivered to. [address] [place], [place] [address]   Please specify on which day you would like your medication(s) to arrive. Note: if you need your medication(s) within 3 days, please call the pharmacy to schedule your order at [phone]  01/16/2023   Has your insurance changed since your last refill? No   Would you like a pharmacist to call you to discuss your medication(s)? No   Do you require a signature for your package? (Note: if we are billing Medicare Part B or your order contains a controlled substance, we will require a signature) No         Completed refill call assessment today to schedule patient's medication shipment from the [facility] [phone]).  All relevant notes have been reviewed.       Confirmed patient received a Conservation officer, historic buildings and a Surveyor, mining with first shipment. The patient will receive a drug information handout for each medication shipped and additional FDA Medication Guides as required.         REFERRAL TO PHARMACIST     Referral to the pharmacist: Not needed      [facility]     Shipping address confirmed in Epic.     Delivery Scheduled: Yes, Expected medication delivery date: 01/16/2023.     Medication will be delivered via UPS to the prescription address in Epic WAM.    Nicholas [name]   [facility] Specialty Technician

## 2023-01-16 MED FILL — TENOFOVIR DISOPROXIL FUMARATE 300 MG TABLET: ORAL | 30 days supply | Qty: 30 | Fill #3

## 2023-01-22 ENCOUNTER — Telehealth
Admit: 2023-01-22 | Discharge: 2023-01-23 | Payer: MEDICARE | Attending: Student in an Organized Health Care Education/Training Program | Primary: Student in an Organized Health Care Education/Training Program

## 2023-01-22 DIAGNOSIS — Z79899 Other long term (current) drug therapy: Principal | ICD-10-CM

## 2023-01-22 DIAGNOSIS — G2401 Drug induced subacute dyskinesia: Principal | ICD-10-CM

## 2023-01-22 DIAGNOSIS — F310 Bipolar disorder, current episode hypomanic: Principal | ICD-10-CM

## 2023-01-22 MED ORDER — VENLAFAXINE ER 150 MG CAPSULE,EXTENDED RELEASE 24 HR
ORAL_CAPSULE | Freq: Every day | ORAL | 0 refills | 90 days | Status: CP
Start: 2023-01-22 — End: 2023-04-22

## 2023-01-22 MED ORDER — OLANZAPINE 7.5 MG TABLET
ORAL_TABLET | Freq: Every evening | ORAL | 0 refills | 30 days | Status: CP
Start: 2023-01-22 — End: 2023-02-21

## 2023-01-22 MED ORDER — LITHIUM CARBONATE ER 450 MG TABLET,EXTENDED RELEASE
ORAL_TABLET | 2 refills | 0 days | Status: CP
Start: 2023-01-22 — End: ?

## 2023-01-22 NOTE — Unmapped (Signed)
[facility]  Psychiatry   Established Patient E&M Service - Outpatient       Assessment:    Nicholas [name] presents for follow-up evaluation. Today, Nicholas [name] is reporting some worsening of mood in the context of stressors including physical symptoms.He has had exacerbation of his diarrhea which really affects his mood as well as his activity level. He is still able to enjoy things. No SI. Sleeping well. No concerns for mania/hypomania since we last met. He is taking medications regularly. He is getting out more d/t adopting puppies recently. He finds that helpful. He reports some subjective improvement in TD. Still having vocalizations (humming, grunting). I did not observe these today. Did observe some movements around mouth, upper face. These appear to be much milder than at some previous visits. Continues to take B6. Will continue to monitor closely. If symptoms continue to be more mild it may support the idea that TD sx were at least in part related to effexor increase. He has submitted application for patient assistance for Vraylar. For now, planning to continue medications as currently prescribed. Will follow-up in 3 weeks.      Identifying Information:  Nicholas [name] is a 67 y.o. male with a history of  Bipolar I Disorder that was diagnosed in 2003. He established with this clinic in September 2022. At that time, he was prescribed vraylar, lithium, doxepin, and xanax with fairly good efficacy and tolerability.     Risk Assessment:  A full psychiatric risk assessment was conducted on 01/31/21 and risks do not appear significantly changed from that visit.   While future psychiatric events cannot be accurately predicted, the patient does not currently require acute inpatient psychiatric care and does not currently meet [facility] involuntary commitment criteria.     Plan:    #Bipolar I Disorder:  --Continue Lithium ER 450 mg QAM, 900 mg at bedtime. Level of 0.9 in February 2024.   -- Continue Zyprexa 7.5 mg nightly (increased 8/9 d/t concern for mania). Could consider decreasing after a period of stability.  --Consider transition back to Vraylar if patient is approved for assistance.  --Recommend taking 400 mg of B6 for TD  --Continue hydroxyzine 25 mg BID PRN.   --Continue Effexor 150 mg daily (s 4/11,i5/12, I5/22, i11/6/23, d8/9/24). Previously had a long period without mania on 225 mg dose (and possibly had more control of depression sx).  --Previously discussed behavioral activation. Have previously discussed getting more plugged in with local community (maybe a church).  --Recommend discussing PT with PCP to help w/ his unsteadiness on feet/hoping this could increase activity level.  --Consider therapy, previously referred to resident CBT but patient never scheduled.     #Medication monitoring:   --Due for metabolic monitoring in February 2025  --Due for lithium labs in July 2024 (planning to monitor Q6 months given age). Labs ordered. Pt reminded today.  --AIMS on 06/19/22 of 2 (scanned to media). Scored for movements in face, mouth, jaw, tongue. Had significant twitching in upper face, tongue movements (though not observed w/ opening but apparent throughout interview and with distraction), puckering. Patient also made frequent vocalizations which he reported no awareness of.    Lab Results   Component Value Date    Cholesterol 133 06/21/2022    LDL Calculated 82 06/21/2022    HDL 33 (L) 06/21/2022    Triglycerides 88 06/21/2022     Hemoglobin A1C:   Lab Results   Component Value Date    Hemoglobin A1C  6.8 01/12/2021     Lithium Lvl   Date Value Ref Range Status   06/21/2022 0.9 0.5 - 1.2 mmol/L Final     TSH   Date Value Ref Range Status   06/21/2022 2.489 0.550 - 4.780 uIU/mL Final     Creatinine Whole Blood, POC   Date Value Ref Range Status   10/01/2019 0.9 0.8 - 1.4 mg/dL Final   16/02/9603 0.9 0.8 - 1.4 mg/dL Final     Creatinine   Date Value Ref Range Status   10/24/2022 0.75 0.73 - 1.18 mg/dL Final   54/01/8118 1.47 0.76 - 1.27 mg/dL Final       Psychotherapy provided:  No billable psychotherapy service provided.    Patient has been given this writer's contact information as well as the [facility] Psychiatry urgent line number. The patient has been instructed to call 911 for emergencies.    Subjective:    Interval History:  Pt reports that things are pretty good. They are staying in [place]. States since our last visit he has started on ozempic, which caused extreme constipation. Tried to tx constipation and has had severe diarrhea again.    States that having diarrhea is stressful and affects his mood. He reports he has very limited time to get to the bathroom if he has the urge to go.    He has been out of his Vemlidy.     He has 2 new puppies. Puppies are keeping him busy.    He reports he is still working on trying to get [employer] covered. He thinks all the documentation has been submitted.    States that he has been down in the context of stressors. States that they are considering a permanent move. States that he has been sad about losing his dogs. States that mood hasn't been great, but it hasn't been that bad.    Reports that he will get outside with his puppies regularly.     Sleep has been pretty good lately. He is waking up less lately.    Denies eany mood elevation, excess energy. Not noticing any sx of mania.     Denies any SI.     States that he is feeling hopeful.     Reviewed medication list. Denies any missed doses. Denies any SE.    States that he thinks TD is better. States that I don't think I have TD anymore. It's just the fact that I'm grunting. He does feel like movements in his face are better.     Hoping to get lithium level next Monday.    Discussed POC (see assessment above for details). No other Q's or concerns today.     Objective:    Mental Status Exam:  Appearance:    Appears stated age, Well nourished, Well developed and Clean/Neat   Motor:   Some twitching in upper face, and around. Decreased from prior. No other abnormal movements noted on interview.   Speech/Language:    Normal rate, volume, tone, fluency and Language intact, well formed.    Mood:   A little down   Affect:   Generally euthymic.   Thought process and Associations:   Logical, linear, clear, coherent, goal directed   Abnormal/psychotic thought content:    Some grief over loss of dogs. Sadness about physical sx.  Denies SI. Remains future oriented. Not voicing any bizarre or delusional thought content on interview.   Perceptual disturbances:      He is not endorsing any  AVH. No RIS on interview.     Other:          I personally spent 35 minutes face-to-face and non-face-to-face in the care of this patient, which includes all pre, intra, and post visit time on the date of service.  All documented time was specific to the E/M visit and does not include any procedures that may have been performed.      The patient reports they are physically located in [place] and is currently: at home. I conducted a audio/video visit. I spent  18m 29s on the video call with the patient. I spent an additional 10 minutes on pre- and post-visit activities on the date of service .       Dennison Bulla, MD

## 2023-01-22 NOTE — Unmapped (Signed)
Follow-up instructions:  --Please have lithium level checked as soon as possible. Would advise having level obtained about 12 hours after most recent dose.  -- Please continue taking your medications as prescribed for your mental health.   -- Do not make changes to your medications, including taking more or less than prescribed, unless under the supervision of your physician. Be aware that some medications may make you feel worse if abruptly stopped  -- Please refrain from using illicit substances, as these can affect your mood and could cause anxiety or other concerning symptoms.   -- Seek further medical care for any increase in symptoms or new symptoms such as thoughts of wanting to hurt yourself or hurt others.     Contact info:  Life-threatening emergencies: call 911 or go to the nearest ER for medical or psychiatric attention.     Issues that need urgent attention but are not life threatening: call the clinic outpatient frontdesk at [phone] for assistance.     Non-urgent routine concerns, questions, and refill requests: please send me a mychart message and I will get back to you within 2 business days. If you prefer to call, please call the front desk at [phone].    Regarding appointments:  - If you need to cancel your appointment, we ask that you call [phone] at least 24 hours before your scheduled appointment.  - If for any reason you arrive 15 minutes later than your scheduled appointment time, you may not be seen and your visit may be rescheduled.  - Please remember that we will not automatically reschedule missed appointments.  - If you miss two (2) appointments without letting us know in advance, you will likely be referred to a provider in your community.  - We will do our best to be on time. Sometimes an emergency will arise that might cause your clinician to be late. We will try to inform you of this when you check in for your appointment. If you wait more than 15 minutes past your appointment time without such notice, please speak with the front desk staff.    In the event of bad weather, the clinic staff will attempt to contact you, should your appointment need to be rescheduled. Additionally, you can call the Patient Weather Line [phone] for system-wide clinic status    For more information and reminders regarding clinic policies (these were provided when you were admitted to the clinic), please ask the front desk.

## 2023-01-30 ENCOUNTER — Telehealth: Payer: Self-pay

## 2023-01-30 NOTE — Telephone Encounter (Signed)
Pt called stating he had started Ozempic but had to discontinue use due to experiencing constipation.

## 2023-01-31 NOTE — Telephone Encounter (Signed)
Tried to call pt but was unable to leave a message due to his voicemail being full.

## 2023-02-01 NOTE — Telephone Encounter (Signed)
Left a message making pt aware that Dr.Nida suggests considering taking a long acting metformin to take along with his glipizide and insulin is not needed at this time per Dr.Nida.

## 2023-02-05 NOTE — Unmapped (Signed)
Patient was notified of operational disruptions. Patient opted to: {Blank:19197::schedule their refill with understanding of potential delay until 10/1 or later.,transfer to another pharmacy. This was facilitated by pharmacy staff     [facility] Special[facility] and Home Delivery Pharmacy Refill Coordination Note    Nicholas [name], DOB: [birth year]  Phone: [phone] (home) [phone] (work)      All above HIPAA information was verified with patient.         02/04/2023     2:50 PM   Specialty Rx Medication Refill Questionnaire   Which Medications would you like refilled and shipped? tenovoir (generic vemlidy)   Please list all current allergies: on file   Have you missed any doses in the last 30 days? No   Have you had any changes to your medication(s) since your last refill? No   How many days remaining of each medication do you have at home? 10   Have you experienced any side effects in the last 30 days? No   Please enter the full address (street address, city, state, zip code) where you would like your medication(s) to be delivered to. [address], [place], [place] [address]   Please specify on which day you would like your medication(s) to arrive. Note: if you need your medication(s) within 3 days, please call the pharmacy to schedule your order at [phone]  02/10/2023   Has your insurance changed since your last refill? No   Would you like a pharmacist to call you to discuss your medication(s)? No   Do you require a signature for your package? (Note: if we are billing Medicare Part B or your order contains a controlled substance, we will require a signature) No         Completed refill call assessment today to schedule patient's medication shipment from the [facility] Delivery Pharmacy [phone]).  All relevant notes have been reviewed.       Confirmed patient received a Conservation officer, historic buildings and a Surveyor, mining with first shipment. The patient will receive a drug information handout for each medication shipped and additional FDA Medication Guides as required.         REFERRAL TO PHARMACIST     Referral to the pharmacist: Not needed      [facility]     Shipping address confirmed in Epic.     Delivery Scheduled: Yes, Expected medication delivery date: 02/09/2023.     Medication will be delivered via UPS to the prescription address in Epic WAM.    Nicholas [name]   [facility] Special[facility] and Home Delivery Pharmacy Specialty Technician

## 2023-02-08 MED FILL — TENOFOVIR DISOPROXIL FUMARATE 300 MG TABLET: ORAL | 30 days supply | Qty: 30 | Fill #4

## 2023-02-13 ENCOUNTER — Other Ambulatory Visit: Payer: Self-pay | Admitting: "Endocrinology

## 2023-02-13 NOTE — Unmapped (Signed)
This is [facility] of Psychiatry calling to schedule your follow up appointment. Please give us a call back at phone number [phone] option 3, when you are ready to schedule. Thank you and have a great day.

## 2023-02-13 NOTE — Unmapped (Unsigned)
[facility]  Psychiatry   Established Patient E&M Service - Outpatient       Assessment:    Nicholas [name] presents for follow-up evaluation. ***      Identifying Information:  Nicholas [name] is a 67 y.o. male with a history of  Bipolar I Disorder that was diagnosed in 2003. He established with this clinic in September 2022. At that time, he was prescribed vraylar, lithium, doxepin, and xanax with fairly good efficacy and tolerability.     Risk Assessment:  A full psychiatric risk assessment was conducted on 01/31/21 and risks do not appear significantly changed from that visit.   While future psychiatric events cannot be accurately predicted, the patient does not currently require acute inpatient psychiatric care and does not currently meet [facility] involuntary commitment criteria.     Plan:    #Bipolar I Disorder:  --Continue Lithium ER 450 mg QAM, 900 mg at bedtime. Level of 0.9 in February 2024. ***  -- Continue Zyprexa 7.5 mg nightly (increased 8/9 d/t concern for mania). Could consider decreasing after a period of stability.  --Consider transition back to Vraylar if patient is approved for assistance.  --Recommend taking 400 mg of B6 for TD***  --Continue hydroxyzine 25 mg BID PRN.   --Continue Effexor 150 mg daily (s 4/11,i5/12, I5/22, i11/6/23, d8/9/24). Previously had a long period without mania on 225 mg dose (and possibly had more control of depression sx).  --Previously discussed behavioral activation. Have previously discussed getting more plugged in with local community (maybe a church).  --Consider therapy, previously referred to resident CBT but patient never scheduled.     #Medication monitoring:   --Due for metabolic monitoring in February 2025  --Due for lithium labs in July 2024 (planning to monitor Q6 months given age). Labs ordered. Pt reminded today.***  --AIMS on 06/19/22 of 2 (scanned to media). Scored for movements in face, mouth, jaw, tongue. Had significant twitching in upper face, tongue movements (though not observed w/ opening but apparent throughout interview and with distraction), puckering. Patient also made frequent vocalizations which he reported no awareness of.    Lab Results   Component Value Date    Cholesterol 133 06/21/2022    LDL Calculated 82 06/21/2022    HDL 33 (L) 06/21/2022    Triglycerides 88 06/21/2022     Hemoglobin A1C:   Lab Results   Component Value Date    Hemoglobin A1C 6.8 01/12/2021     Lithium Lvl   Date Value Ref Range Status   06/21/2022 0.9 0.5 - 1.2 mmol/L Final     TSH   Date Value Ref Range Status   06/21/2022 2.489 0.550 - 4.780 uIU/mL Final     Creatinine Whole Blood, POC   Date Value Ref Range Status   10/01/2019 0.9 0.8 - 1.4 mg/dL Final   16/02/9603 0.9 0.8 - 1.4 mg/dL Final     Creatinine   Date Value Ref Range Status   10/24/2022 0.75 0.73 - 1.18 mg/dL Final   54/01/8118 1.47 0.76 - 1.27 mg/dL Final       Psychotherapy provided:  No billable psychotherapy service provided.    Patient has been given this writer's contact information as well as the [facility] Psychiatry urgent line number. The patient has been instructed to call 911 for emergencies.    Subjective:    Interval History:  ***  Objective:    Mental Status Exam:  Appearance:    Appears stated age, Well  nourished, Well developed and Clean/Neat   Motor:   Some twitching in upper face, and around. Decreased from prior. No other abnormal movements noted on interview.***   Speech/Language:    Normal rate, volume, tone, fluency and Language intact, well formed.    Mood:   A little down***   Affect:   Generally euthymic.   Thought process and Associations:   Logical, linear, clear, coherent, goal directed   Abnormal/psychotic thought content:    Some grief over loss of dogs. Sadness about physical sx.  Denies SI. Remains future oriented. Not voicing any bizarre or delusional thought content on interview.***   Perceptual disturbances:      He is not endorsing any AVH. No RIS on interview. Other:        I personally spent *** minutes face-to-face and non-face-to-face in the care of this patient, which includes all pre, intra, and post visit time on the date of service.  All documented time was specific to the E/M visit and does not include any procedures that may have been performed.    {    Coding tips - Do not edit this text, it will delete upon signing of note!    Telephone visits [phone] for Physicians and APPs and [phone] for Non- Physician Clinicians)- Only use minutes on the phone to determine level of service.    Video visits [phone]) - Use either level of medical decision making just as an in-person visit OR time which includes both minutes on video and pre/post minutes to determine the level of service.      :75688}  The patient reports they are physically located in [place] and is currently: {patient location:[address]}. I conducted a {phone audio video visit:101857} .     Dennison Bulla, MD

## 2023-02-20 NOTE — Unmapped (Signed)
Hi Nicholas [name],    Please call the Psychiatry Outpatient Clinic at [phone] option 3 if you need to reschedule the 02/13/2023 appointment with Belva Chimes, MD.    Appointment Cancellation and Missed Appointment Policy:  If you need to cancel your appointment, we ask that you call [phone] option 3 at least 24 hours before you are scheduled.  This will provide [place] with enough time to offer the slot to someone waiting for an appointment.    If for any reason you arrive 10 minutes later than your scheduled appointment time, you may not be seen and your visit may be rescheduled.    If you miss an appointment and do not call to cancel it, you will receive a mychart message or text notifying that you missed an appointment and asking you to call and reschedule it.  We will not automatically reschedule missed appointments.    If you miss three (3) appointments without letting us know at least 24 hours in advance, you may be terminated from clinic and referred to a provider in your community.  Missed appointments prevent others from obtaining care.    Thanks!    [facility] - Psychiatry Outpatient Clinic  P [phone] - F [phone]

## 2023-02-23 MED ORDER — OLANZAPINE 7.5 MG TABLET
ORAL_TABLET | 0 refills | 0 days | Status: CP
Start: 2023-02-23 — End: ?

## 2023-03-09 NOTE — Unmapped (Signed)
[facility] Specialty and Home Delivery Pharmacy Refill Coordination Note    Nicholas [name], [employer]: Nov 06, 1955  Phone: [phone] (home) [phone] (work)      All above HIPAA information was verified with patient.         03/07/2023     5:44 PM   Specialty Rx Medication Refill Questionnaire   Which Medications would you like refilled and shipped? Tenoviour (generic of Vemlidy)   Please list all current allergies: on file   Have you missed any doses in the last 30 days? No   Have you had any changes to your medication(s) since your last refill? No   How many days remaining of each medication do you have at home? 10   Have you experienced any side effects in the last 30 days? No   Please enter the full address (street address, city, state, zip code) where you would like your medication(s) to be delivered to. [address] [place], [place] [address]   Please specify on which day you would like your medication(s) to arrive. Note: if you need your medication(s) within 3 days, please call the pharmacy to schedule your order at [phone]  02/08/2023   Has your insurance changed since your last refill? No   Would you like a pharmacist to call you to discuss your medication(s)? No   Do you require a signature for your package? (Note: if we are billing Medicare Part B or your order contains a controlled substance, we will require a signature) No         Completed refill call assessment today to schedule patient's medication shipment from the [facility] Delivery Pharmacy [phone]).  All relevant notes have been reviewed.       Confirmed patient received a Conservation officer, historic buildings and a Surveyor, mining with first shipment. The patient will receive a drug information handout for each medication shipped and additional FDA Medication Guides as required.         REFERRAL TO PHARMACIST     Referral to the pharmacist: Not needed      [facility]     Shipping address confirmed in Epic.     Delivery Scheduled: Yes, Expected medication delivery date: 03/13/23.     Medication will be delivered via UPS to the prescription address in Epic WAM.    [name]   [facility] Specialty and Home Delivery Pharmacy Specialty Technician

## 2023-03-12 ENCOUNTER — Ambulatory Visit: Payer: Medicare HMO | Admitting: "Endocrinology

## 2023-03-12 MED FILL — TENOFOVIR DISOPROXIL FUMARATE 300 MG TABLET: ORAL | 30 days supply | Qty: 30 | Fill #5

## 2023-03-13 DIAGNOSIS — I8510 Secondary esophageal varices without bleeding: Principal | ICD-10-CM

## 2023-03-13 DIAGNOSIS — K766 Portal hypertension: Principal | ICD-10-CM

## 2023-03-13 DIAGNOSIS — B181 Chronic viral hepatitis B without delta-agent: Principal | ICD-10-CM

## 2023-03-13 DIAGNOSIS — K7469 Other cirrhosis of liver: Principal | ICD-10-CM

## 2023-03-13 MED ORDER — NADOLOL 20 MG TABLET
ORAL_TABLET | Freq: Every day | ORAL | 1 refills | 90 days | Status: CP
Start: 2023-03-13 — End: 2023-09-09

## 2023-03-13 NOTE — Unmapped (Signed)
Refill request for Nadolol    Labs done 10-24-22    Clinic visit done 10-26-22    Next clinic visit scheduled for 04-30-23    Will authorize refill at this time

## 2023-03-14 DIAGNOSIS — I8510 Secondary esophageal varices without bleeding: Principal | ICD-10-CM

## 2023-03-14 DIAGNOSIS — K766 Portal hypertension: Principal | ICD-10-CM

## 2023-03-14 DIAGNOSIS — B181 Chronic viral hepatitis B without delta-agent: Principal | ICD-10-CM

## 2023-03-14 DIAGNOSIS — K7469 Other cirrhosis of liver: Principal | ICD-10-CM

## 2023-03-14 MED ORDER — NADOLOL 20 MG TABLET
ORAL_TABLET | Freq: Every day | ORAL | 1 refills | 90 days
Start: 2023-03-14 — End: 2023-09-10

## 2023-03-15 MED ORDER — NADOLOL 20 MG TABLET
ORAL_TABLET | Freq: Every day | ORAL | 1 refills | 90 days
Start: 2023-03-15 — End: 2023-09-11

## 2023-03-16 NOTE — Unmapped (Signed)
Refilled 03-13-23

## 2023-03-19 DIAGNOSIS — Z6838 Body mass index (BMI) 38.0-38.9, adult: Secondary | ICD-10-CM | POA: Diagnosis not present

## 2023-03-19 DIAGNOSIS — K047 Periapical abscess without sinus: Secondary | ICD-10-CM | POA: Diagnosis not present

## 2023-03-19 DIAGNOSIS — E669 Obesity, unspecified: Secondary | ICD-10-CM | POA: Diagnosis not present

## 2023-03-19 DIAGNOSIS — K122 Cellulitis and abscess of mouth: Secondary | ICD-10-CM | POA: Diagnosis not present

## 2023-03-23 ENCOUNTER — Telehealth: Payer: Self-pay | Admitting: Nurse Practitioner

## 2023-03-23 NOTE — Telephone Encounter (Signed)
Pt called and made aware that is ozempic is here ready for pick up (left VM)

## 2023-03-25 MED ORDER — VENLAFAXINE ER 150 MG CAPSULE,EXTENDED RELEASE 24 HR
ORAL_CAPSULE | Freq: Every day | ORAL | 0 refills | 90 days
Start: 2023-03-25 — End: 2023-06-23

## 2023-03-26 MED ORDER — VENLAFAXINE ER 150 MG CAPSULE,EXTENDED RELEASE 24 HR
ORAL_CAPSULE | Freq: Every day | ORAL | 0 refills | 90 days | Status: CP
Start: 2023-03-26 — End: 2023-06-24

## 2023-04-02 DIAGNOSIS — B181 Chronic viral hepatitis B without delta-agent: Principal | ICD-10-CM

## 2023-04-02 MED ORDER — TENOFOVIR DISOPROXIL FUMARATE 300 MG TABLET
ORAL_TABLET | Freq: Every day | ORAL | 5 refills | 30 days
Start: 2023-04-02 — End: ?

## 2023-04-03 MED ORDER — TENOFOVIR DISOPROXIL FUMARATE 300 MG TABLET
ORAL_TABLET | Freq: Every day | ORAL | 5 refills | 30 days | Status: CP
Start: 2023-04-03 — End: ?
  Filled 2023-04-16: qty 30, 30d supply, fill #0

## 2023-04-03 NOTE — Unmapped (Signed)
Refill request for Viread    LCV 10/26/22    Labs done 10/24/22    Next visit scheduled for 04/30/23    Will authorize refill at this time

## 2023-04-09 ENCOUNTER — Telehealth
Admit: 2023-04-09 | Discharge: 2023-04-10 | Attending: Student in an Organized Health Care Education/Training Program | Primary: Student in an Organized Health Care Education/Training Program

## 2023-04-09 DIAGNOSIS — F310 Bipolar disorder, current episode hypomanic: Secondary | ICD-10-CM | POA: Diagnosis not present

## 2023-04-09 DIAGNOSIS — Z79899 Other long term (current) drug therapy: Secondary | ICD-10-CM | POA: Diagnosis not present

## 2023-04-09 DIAGNOSIS — G2401 Drug induced subacute dyskinesia: Secondary | ICD-10-CM | POA: Diagnosis not present

## 2023-04-09 MED ORDER — OLANZAPINE 7.5 MG TABLET
ORAL_TABLET | Freq: Every evening | ORAL | 0 refills | 30 days | Status: CP
Start: 2023-04-09 — End: ?

## 2023-04-09 MED ORDER — LITHIUM CARBONATE ER 450 MG TABLET,EXTENDED RELEASE
ORAL_TABLET | 2 refills | 0 days | Status: CP
Start: 2023-04-09 — End: ?

## 2023-04-09 NOTE — Unmapped (Signed)
[facility]  Psychiatry   Established Patient E&M Service - Outpatient       Assessment:    Nicholas [name] presents for follow-up evaluation. Today, Nicholas [name] is fairly stable psychiatrically. He is sleeping well. Mood is good. He is enjoying things including his puppies and spending time w/ his husband. He has stopped taking B6. Did not feel it had much efficacy. Doesn't feel there has been any changes in TD since he stopped that. Taking medications consistently and they seem to be effective for him. No SE aside from ongoing TD. He is interested in trying a medication for TD.    Following our appointment, Nicholas [name] let me know that he finally got approval for patient assistance for Vraylar. We are discussing plans via mychart message. Tentative plan will be lowering Zyprexa dose to 5 mg and starting 1.5 mg Vraylar when he receives it. Would plan to have some overlap of these medications and slowly taper Zyprexa. I did reach out to his hepatology provider and to a pharmacist given pt's hx of cirrhosis. Per hepatology, there would be no concerns with him taking Valbenazine and no dose adjustment would be needed. We did discuss risks/benefits of this medication. I have also messaged Nicholas [name] to confirm whether he would like to try to get Valbenazine approved. There is a chance TD could improve with switch to Vraylar. Also recommended stopping any 1st gen antihistamines or highly anticholinergic meds which could also contribute to TD. We are planning to follow-up in ~6 weeks.    Identifying Information:  Nicholas [name] is a 67 y.o. male with a history of  Bipolar I Disorder that was diagnosed in 2003. He established with this clinic in September 2022. At that time, he was prescribed vraylar, lithium, doxepin, and xanax with fairly good efficacy and tolerability.     Risk Assessment:  A full psychiatric risk assessment was conducted on 01/31/21 and risks do not appear significantly changed from that visit.   While future psychiatric events cannot be accurately predicted, the patient does not currently require acute inpatient psychiatric care and does not currently meet [facility] involuntary commitment criteria.     Plan:    #Bipolar I Disorder:  --Continue Lithium ER 450 mg QAM, 900 mg at bedtime. Level of 0.9 in February 2024.   -- Continue Zyprexa 7.5 mg nightly (increased 8/9 d/t concern for mania). Could consider decreasing after a period of stability. Likely will decrease Zyprexa to 5 mg nightly in anticipation of transition to Vraylar.  --Plan to start Vraylar 1.5 mg when available.  --Recommend taking 400 mg of B6 for TD  --Continue hydroxyzine 25 mg BID PRN.   --Continue Effexor 150 mg daily (s 4/11,i5/12, I5/22, i11/6/23, d8/9/24). Previously had a long period without mania on 225 mg dose (and possibly had more control of depression sx).  --Previously discussed behavioral activation. Have previously discussed getting more plugged in with local community (maybe a church).  --Recommend discussing PT with PCP to help w/ his unsteadiness on feet/hoping this could increase activity level.  --Consider therapy, previously referred to resident CBT but patient never scheduled.     #Medication monitoring:   --Due for metabolic monitoring in February 2025  --Due for lithium labs in July 2024 (planning to monitor Q6 months given age). Labs ordered. Pt reminded today.  --AIMS on 06/19/22 of 2 (scanned to media). Scored for movements in face, mouth, jaw, tongue. Had significant twitching in upper face, tongue movements (  though not observed w/ opening but apparent throughout interview and with distraction), puckering. Patient also made frequent vocalizations which he reported no awareness of.    Lab Results   Component Value Date    Cholesterol 133 06/21/2022    LDL Calculated 82 06/21/2022    HDL 33 (L) 06/21/2022    Triglycerides 88 06/21/2022     Hemoglobin A1C:   Lab Results   Component Value Date Hemoglobin A1C 7.2 11/01/2022     Lithium Lvl   Date Value Ref Range Status   06/21/2022 0.9 0.5 - 1.2 mmol/L Final     TSH   Date Value Ref Range Status   06/21/2022 2.489 0.550 - 4.780 uIU/mL Final     Creatinine Whole Blood, POC   Date Value Ref Range Status   10/01/2019 0.9 0.8 - 1.4 mg/dL Final   16/02/9603 0.9 0.8 - 1.4 mg/dL Final     Creatinine   Date Value Ref Range Status   10/24/2022 0.75 0.73 - 1.18 mg/dL Final   54/01/8118 1.47 0.76 - 1.27 mg/dL Final       Psychotherapy provided:  No billable psychotherapy service provided.    Patient has been given this writer's contact information as well as the [facility] Psychiatry urgent line number. The patient has been instructed to call 911 for emergencies.    Subjective:    Interval History: Pt reports he is doing well. He and his husband moved to [place]. They are still spending time in [place] as well. He found out from the [employer] application is still being reviewed. He is supposed to hear back by the end of today.    Zyprexa is working pretty well.     States that he has felt pretty good. He is staying busy w/ his new puppy.     He is sleeping less lately.     He had an abcessed tooth recently, which he had to have extracted.    He has been decorating for Christmas. Stressed about getting his Christmas shopping done.     He is enjoying things recently. Has been going out to eat a lot. Getting to spend a bit more time w/ his husband since he is not having to commute.     His husband is still commenting on his grunting. He is not taking B6 anymore. He didn't think it was helping. No change in grunting, facial movement since he stopped the B6.    States that he has grunting every hour. States that he will stop it if [place] comments on it. Can temporarily suppress the grunting, but can also do it without noticing.    Denies any concerns for mania/mood elevation. No decreased need for sleep.     Reviewed medication list -- Denies missed doses. Denies any SE aside from TD. Doesn't feel like TD is getting worse, but also not improving.    He and his husband are currently sharing a car.     Reminded of need for lithium.    He did have dental procedure last week. Teeth generally in good condition. Rates awareness/distress as 3. He states it primarily bothers others. No incapication. Rates severity as a 3. Did complete abbreviated AIMS via video today.    Discussed POC (See assessment above for details). No other Q's or concerns today.    Abbreviated AIMS -- patient scores severity/awareness/distress as a 3. Will rate pt as 2 for movements in muscles of facial expression. May be a 1 for movements  in mouth/jaw, these seem to be decreased from prior AIMS. No other major movements were observed during interview, brief exam. Of note, pt was not having grunting or other vocalizations during our interview today.    Objective:    Mental Status Exam:  Appearance:    Appears stated age, Well nourished, Well developed and Clean/Neat   Motor:   Some twitching in upper face, and around mouth. No other abnormal movements noted on interview.   Speech/Language:    Normal rate, volume, tone, fluency and Language intact, well formed.    Mood:   Pretty good   Affect:   Generally euthymic.   Thought process and Associations:   Logical, linear, clear, coherent, goal directed   Abnormal/psychotic thought content:    Doing well, not endorsing SI. Remains future oriented. Not voicing any bizarre or delusional thought content on interview.   Perceptual disturbances:      He is not endorsing any AVH. No RIS on interview.     Other:          I personally spent 53 minutes face-to-face and non-face-to-face in the care of this patient, which includes all pre, intra, and post visit time on the date of service.  All documented time was specific to the E/M visit and does not include any procedures that may have been performed.        The patient reports they are physically located in [place] and is currently: at home. I conducted a audio/video visit. I spent  47m 29s on the video call with the patient. I spent an additional 20 minutes on pre- and post-visit activities on the date of service .         Dennison Bulla, MD

## 2023-04-11 MED ORDER — OLANZAPINE 5 MG TABLET
ORAL_TABLET | Freq: Every evening | ORAL | 0 refills | 30 days | Status: CP
Start: 2023-04-11 — End: 2023-05-11

## 2023-04-11 NOTE — Unmapped (Signed)
The [facility] has made a second and final attempt to reach this patient to refill the following medication:tenofovir disoproxil 300 mg tablet (VIREAD).      We have left voicemails on the following phone numbers: [phone], have been unable to leave messages on the following phone numbers: [phone], have sent a text message to the following phone numbers: [phone], and have sent a Mychart questionnaire..    Dates contacted: 04/02/23 and 04/11/23  Last scheduled delivery: 03/12/23    The patient may be at risk of non-compliance with this medication. The patient should call the [facility] at [phone]  Option 4, then Option 4: Infectious Disease, Transplant to refill medication.    [name]   [facility] and [facility]

## 2023-04-11 NOTE — Unmapped (Signed)
[facility] Specialty and Home Delivery Pharmacy Refill Coordination Note    Specialty Medication(s) to be Shipped:   Infectious Disease: Viread    Other medication(s) to be shipped: No additional medications requested for fill at this time     Nicholas [name], DOB: Jan 24, 1956  Phone: [phone] (home) [phone] (work)      All above HIPAA information was verified with patient.     Was a Nurse, learning disability used for this call? No    Completed refill call assessment today to schedule patient's medication shipment from the [facility] and Home Delivery Pharmacy  [phone]).  All relevant notes have been reviewed.     Specialty medication(s) and dose(s) confirmed: Regimen is correct and unchanged.   Changes to medications: [name] reports no changes at this time.  Changes to insurance: No  New side effects reported not previously addressed with a pharmacist or physician: None reported  Questions for the pharmacist: No    Confirmed patient received a Conservation officer, historic buildings and a Surveyor, mining with first shipment. The patient will receive a drug information handout for each medication shipped and additional FDA Medication Guides as required.       DISEASE/MEDICATION-SPECIFIC INFORMATION        N/A    SPECIALTY MEDICATION ADHERENCE     Medication Adherence    Patient reported X missed doses in the last month: 0  Specialty Medication: Vired 300mg  daily  Patient is on additional specialty medications: No  Patient is on more than two specialty medications: No  Any gaps in refill history greater than 2 weeks in the last 3 months: no  Demonstrates understanding of importance of adherence: yes  Informant: patient          Were doses missed due to medication being on hold? No    Vired 300 mg: 10 days of medicine on hand       REFERRAL TO PHARMACIST     Referral to the pharmacist: Not needed      [facility]     Shipping address confirmed in Epic.       Delivery Scheduled: Yes, Expected medication delivery date: 04/17/23. Medication will be delivered via UPS to the prescription address in Epic WAM.    Nicholas [name], PharmD   [facility] and [facility] Delivery [facility]  Specialty Pharmacist

## 2023-04-14 NOTE — Unmapped (Signed)
Due to a recent change in your provider's schedule, we are reaching out to reschedule your appointment. Please reach out when you have availability to reschedule at phone number [phone] option 3. Thank you and have a great day.

## 2023-04-16 ENCOUNTER — Ambulatory Visit: Payer: Medicare HMO | Admitting: "Endocrinology

## 2023-04-30 ENCOUNTER — Ambulatory Visit: Admit: 2023-04-30 | Payer: MEDICARE

## 2023-05-08 MED ORDER — VENLAFAXINE ER 75 MG CAPSULE,EXTENDED RELEASE 24 HR
ORAL_CAPSULE | Freq: Every day | ORAL | 1 refills | 0.00 days
Start: 2023-05-08 — End: 2023-08-06

## 2023-05-11 MED ORDER — VENLAFAXINE ER 150 MG CAPSULE,EXTENDED RELEASE 24 HR
ORAL_CAPSULE | Freq: Every day | ORAL | 0 refills | 90.00 days | Status: CP
Start: 2023-05-11 — End: 2023-08-09

## 2023-05-14 MED ORDER — VENLAFAXINE ER 150 MG CAPSULE,EXTENDED RELEASE 24 HR
ORAL_CAPSULE | Freq: Every day | ORAL | 0 refills | 90.00 days | Status: CP
Start: 2023-05-14 — End: 2023-08-12

## 2023-05-17 ENCOUNTER — Other Ambulatory Visit: Payer: Self-pay | Admitting: "Endocrinology

## 2023-05-30 NOTE — Unmapped (Signed)
[facility] Specialty and Home Delivery Pharmacy Refill Coordination Note    Specialty Medication(s) to be Shipped:   Infectious Disease: tenofovir disoproxil fumarate    Other medication(s) to be shipped: No additional medications requested for fill at this time     Nicholas [name], DOB: February 03, 1956  Phone: [phone] (home) [phone] (work)      All above HIPAA information was verified with patient.     Was a Nurse, learning disability used for this call? No    Completed refill call assessment today to schedule patient's medication shipment from the [facility] and [facility] Delivery Pharm[facility]  [phone]).  All relevant notes have been reviewed.     Specialty medication(s) and dose(s) confirmed: Regimen is correct and unchanged.   Changes to medications: Nicholas [name] reports no changes at this time.  Changes to insurance: No  New side effects reported not previously addressed with a pharmacist or physician: None reported  Questions for the pharmacist: No    Confirmed patient received a Conservation officer, historic buildings and a Surveyor, mining with first shipment. The patient will receive a drug information handout for each medication shipped and additional FDA Medication Guides as required.       DISEASE/MEDICATION-SPECIFIC INFORMATION        N/A    SPECIALTY MEDICATION ADHERENCE     Medication Adherence    Patient reported X missed doses in the last month: 0  Specialty Medication: tenofovir disoproxil 300 mg tablet (VIREAD)  Patient is on additional specialty medications: No  Patient is on more than two specialty medications: No  Any gaps in refill history greater than 2 weeks in the last 3 months: no  Demonstrates understanding of importance of adherence: yes  Informant: patient  Confirmed plan for next specialty medication refill: delivery by pharmacy  Refills needed for supportive medications: not needed          Refill Coordination    Has the Patients' Contact Information Changed: No  Is the Shipping Address Different: No         Were doses missed due to medication being on hold? No    tenofovir disoproxil 300  mg: 10 days of medicine on hand       REFERRAL TO PHARMACIST     Referral to the pharmacist: Not needed      [facility]     Shipping address confirmed in Epic.       Delivery Scheduled: Yes, Expected medication delivery date: 06/07/23.     Medication will be delivered via UPS to the prescription address in Epic WAM.    [name]   [facility] Specialty and Home Delivery Pharmacy  Specialty Technician

## 2023-06-04 DIAGNOSIS — R051 Acute cough: Secondary | ICD-10-CM | POA: Diagnosis not present

## 2023-06-04 DIAGNOSIS — E669 Obesity, unspecified: Secondary | ICD-10-CM | POA: Diagnosis not present

## 2023-06-04 DIAGNOSIS — Z6839 Body mass index (BMI) 39.0-39.9, adult: Secondary | ICD-10-CM | POA: Diagnosis not present

## 2023-06-04 DIAGNOSIS — J101 Influenza due to other identified influenza virus with other respiratory manifestations: Secondary | ICD-10-CM | POA: Diagnosis not present

## 2023-06-04 DIAGNOSIS — R030 Elevated blood-pressure reading, without diagnosis of hypertension: Secondary | ICD-10-CM | POA: Diagnosis not present

## 2023-06-06 MED FILL — TENOFOVIR DISOPROXIL FUMARATE 300 MG TABLET: ORAL | 30 days supply | Qty: 30 | Fill #1

## 2023-06-11 ENCOUNTER — Encounter
Admit: 2023-06-11 | Discharge: 2023-06-12 | Attending: Student in an Organized Health Care Education/Training Program | Primary: Student in an Organized Health Care Education/Training Program

## 2023-06-11 DIAGNOSIS — G2401 Drug induced subacute dyskinesia: Principal | ICD-10-CM

## 2023-06-11 DIAGNOSIS — Z79899 Other long term (current) drug therapy: Principal | ICD-10-CM

## 2023-06-11 DIAGNOSIS — F310 Bipolar disorder, current episode hypomanic: Principal | ICD-10-CM

## 2023-06-11 NOTE — Unmapped (Signed)
[facility]  Psychiatry   Established Patient E&M Service - Outpatient       Assessment:    Nicholas [name] presents for follow-up evaluation. Today, Nicholas [name] is reporting stable mood and no concerns for any manic symptoms. He reports he has done well overall since our last visit. Has started taking Vraylar w/ no SE noted. He has continued to take 2.5 mg Zyprexa as well. He notes that he did accidentally take 300 mg effexor for a time, but has now reduced to 225 mg daily. He reports that TD seems to be slightly improved recently. He has some difficulty assessing as he does not have much awareness of movements or grunting. His husband has not been commenting on grunting as often lately which may indicate some improvement. He was not grunting today and movements seem to be significantly less pronounced than at some visits in the past. He is open to continuing cross titration and assessing if this has any impact on TD prior to making a decision on other tx for TD. Previously discussed VMAT inhibitor and we reviewed possible SE of valbenazine again today. Did discuss need for monitoring for both lithium and antipsychotic medication. Due for in-person visit for AIMS. Pt was amenable to getting labs drawn and talking to his spouse about a good time to come in-person for a visit. Discussed discontinuing Zyprexa. Will continue current Vraylar dose for now. Would like to use minimum effective dose given age and TD, but may need to increase depending on symptom control. Previously patient took 3 mg dose. We will plan to follow-up in ~1 month.    Identifying Information:  Nicholas [name] is a 68 y.o. male with a history of  Bipolar I Disorder that was diagnosed in 2003. He established with this clinic in September 2022. At that time, he was prescribed vraylar, lithium, doxepin, and xanax with fairly good efficacy and tolerability.     Risk Assessment:  A full psychiatric risk assessment was conducted on 01/31/21 and risks do not appear significantly changed from that visit.   While future psychiatric events cannot be accurately predicted, the patient does not currently require acute inpatient psychiatric care and does not currently meet [facility] involuntary commitment criteria.     Plan:    #Bipolar I Disorder:  --Continue Lithium ER 450 mg QAM, 900 mg at bedtime. Level of 0.9 in February 2024.   --DISCONTINUE Zyprexa (Olanzapine)  --Continue Vraylar 1.5 mg daily for now. Consider dose increase (previously on 3 mg). Pt reports starting about 1 month ag.  --Continue hydroxyzine 25 mg BID PRN.   --Continue Effexor 225 mg daily (s 4/11,i5/12, I5/22, i11/6/23, d8/9/24). Did previously have mania on 225 (though this was in context of significant stressors). Has had prolonged period w/o mania at this dose in the past. Depression generally seems to be more well-controlled at this dose compared to 150 mg.   --Previously discussed behavioral activation. Have previously discussed getting more plugged in with local community (maybe a church).  --Recommend discussing PT with PCP to help w/ his unsteadiness on feet/hoping this could increase activity level.  --Consider therapy, previously referred to resident CBT but patient never scheduled.     #Medication monitoring:   --Pt is due for metabolic labs, lithium labs this month. Discussed today and labs ordered.   --Discussed need for in-person visit for repeat AIMS  --AIMS on 06/19/22 of 2 (scanned to media). Scored for movements in face, mouth, jaw, tongue. Had  significant twitching in upper face, tongue movements (though not observed w/ opening but apparent throughout interview and with distraction), puckering. Patient also made frequent vocalizations which he reported no awareness of.    Lab Results   Component Value Date    Cholesterol 133 06/21/2022    LDL Calculated 82 06/21/2022    HDL 33 (L) 06/21/2022    Triglycerides 88 06/21/2022     Hemoglobin A1C:   Lab Results Component Value Date    Hemoglobin A1C 7.2 11/01/2022     Lithium Lvl   Date Value Ref Range Status   06/21/2022 0.9 0.5 - 1.2 mmol/L Final     TSH   Date Value Ref Range Status   06/21/2022 2.489 0.550 - 4.780 uIU/mL Final     Creatinine Whole Blood, POC   Date Value Ref Range Status   10/01/2019 0.9 0.8 - 1.4 mg/dL Final   16/02/9603 0.9 0.8 - 1.4 mg/dL Final     Creatinine   Date Value Ref Range Status   10/24/2022 0.75 0.73 - 1.18 mg/dL Final   54/01/8118 1.47 0.76 - 1.27 mg/dL Final       Psychotherapy provided:  No billable psychotherapy service provided.    Patient has been given this writer's contact information as well as the [facility] Psychiatry urgent line number. The patient has been instructed to call [phone] for emergencies.    Subjective:    Interval History: He has the flu right now. States that he is taking antibiotics + theraflu. States that he has been sick for 5 days, he is starting to feel a bit better.    Mood has been pretty good. States that he is on Vraylar now and that is working fine.     Thinks he is taking 1.5 mg Vraylar (1 pill per day). States he has been taking it for a month. He is still taking 2.5 mg of Zyprexa. No SE.    Sleep is OK. States that he will wake up after 2 hours, but has an easy time getting back to sleep. Getting ~6 hrs of sleep. Energy is low lately, but attributes this to the flu.     States that he is interested in things, enjoying things.     Spending a lot of time of [place] lately. That is going well. He is enjoying his dogs.    No recent concerns for mania, mood elevation.     Denies any major concerns.    He is now taking 225 mg of effexor. He did take 300 mg effexor for a while by mistake. States that he has been on the 225 mg for about 3 weeks.     He reports that TD seems to be a little better. States that he doesn't have much awareness of the movements or grunting. Husband usually gives him feedback about grunting. His husband has mentioned it less lately.     He notes that trying to keep up w/ two households has been a stressor. He notes that has caused some financial strain.    His blood sugar hasn't been as well controlled lately. He is meeting with a new PCP in [place] later this month. He doesn't think that person is in the [facility] system. He hasn't seen his previous PCP in 2 years.    No problems w/ his medications lately. States he misses his medications very infrequently. His husband fills his pill containers.    Discussed POC (see assessment above for details). No other  Q's or concerns today.      Objective:    Mental Status Exam:  Appearance:    Appears stated age, Well nourished, Well developed and Clean/Neat   Motor:   Some twitching in upper face, and around mouth. No other abnormal movements noted on interview.   Speech/Language:    Normal rate, volume, tone, fluency and Language intact, well formed. No grunting or other sounds on interview today.   Mood:   Pretty good   Affect:   Generally euthymic.   Thought process and Associations:   Logical, linear, clear, coherent, goal directed   Abnormal/psychotic thought content:    Doing well, not endorsing SI. Remains future oriented. Not voicing any bizarre or delusional thought content on interview.   Perceptual disturbances:      He is not endorsing any AVH. No RIS on interview.     Other:          I personally spent 32 minutes face-to-face and non-face-to-face in the care of this patient, which includes all pre, intra, and post visit time on the date of service.  All documented time was specific to the E/M visit and does not include any procedures that may have been performed.        The patient reports they are physically located in [place] and is currently: at home. I conducted a audio/video visit. I spent  75m 12s on the video call with the patient. I spent an additional 10 minutes on pre- and post-visit activities on the date of service .       Dennison Bulla, MD

## 2023-06-11 NOTE — Unmapped (Signed)
Follow-up instructions:  1) Discontinue Zyprexa   2) Please have labs drawn at a [facility] facility as soon as possible Please have labs drawn in the morning and take your morning lithium dose after you have the blood draw. Ideally you'd also have the labs done while fasting (so before you have anything to eat for the day)  3) We need to have an in-person appointment to do an updated abnormal movement assessment (AIMS exam). Please let me know when you would be able to come in-person for that. I am typically in the office on Mondays and Thursdays. Monday I have slots all day. Thursdays I usually just have morning appointments.  -- Please continue taking your medications as prescribed for your mental health.   -- Do not make changes to your medications, including taking more or less than prescribed, unless under the supervision of your physician. Be aware that some medications may make you feel worse if abruptly stopped  -- Please refrain from using illicit substances, as these can affect your mood and could cause anxiety or other concerning symptoms.   -- Seek further medical care for any increase in symptoms or new symptoms such as thoughts of wanting to hurt yourself or hurt others.     Contact info:  Life-threatening emergencies: call 911 or go to the nearest ER for medical or psychiatric attention.     Issues that need urgent attention but are not life threatening: call the clinic outpatient frontdesk at [phone] for assistance.     Non-urgent routine concerns, questions, and refill requests: please send me a mychart message and I will get back to you within 2 business days. If you prefer to call, please call the front desk at [phone].    Regarding appointments:  - If you need to cancel your appointment, we ask that you call [phone] at least 24 hours before your scheduled appointment.  - If for any reason you arrive 15 minutes later than your scheduled appointment time, you may not be seen and your visit may be rescheduled.  - Please remember that we will not automatically reschedule missed appointments.  - If you miss two (2) appointments without letting us know in advance, you will likely be referred to a provider in your community.  - We will do our best to be on time. Sometimes an emergency will arise that might cause your clinician to be late. We will try to inform you of this when you check in for your appointment. If you wait more than 15 minutes past your appointment time without such notice, please speak with the front desk staff.    In the event of bad weather, the clinic staff will attempt to contact you, should your appointment need to be rescheduled. Additionally, you can call the Patient Weather Line [phone] for system-wide clinic status    For more information and reminders regarding clinic policies (these were provided when you were admitted to the clinic), please ask the front desk.

## 2023-06-21 ENCOUNTER — Other Ambulatory Visit: Payer: Self-pay | Admitting: "Endocrinology

## 2023-06-26 DIAGNOSIS — Z139 Encounter for screening, unspecified: Principal | ICD-10-CM

## 2023-06-26 DIAGNOSIS — B1910 Unspecified viral hepatitis B without hepatic coma: Principal | ICD-10-CM

## 2023-06-26 DIAGNOSIS — K7460 Unspecified cirrhosis of liver: Principal | ICD-10-CM

## 2023-06-26 DIAGNOSIS — Z125 Encounter for screening for malignant neoplasm of prostate: Principal | ICD-10-CM

## 2023-06-28 NOTE — Unmapped (Addendum)
 [facility] Specialty and Home Delivery Pharmacy Refill Coordination Note    Nicholas [name], [place]: June 05, 1955  Phone: [phone] (home) [phone] (work)      All above HIPAA information was verified with patient.         06/27/2023     4:10 PM   Specialty Rx Medication Refill Questionnaire   Which Medications would you like refilled and shipped? Tenofior (vemlidy generic)   Please list all current allergies: On file   Have you missed any doses in the last 30 days? No   Have you had any changes to your medication(s) since your last refill? No   How many days remaining of each medication do you have at home? 10   Have you experienced any side effects in the last 30 days? No   Please enter the full address (street address, city, state, zip code) where you would like your medication(s) to be delivered to. Nicholas [name], [address], [place], [place] [address]   Please specify on which day you would like your medication(s) to arrive. Note: if you need your medication(s) within 3 days, please call the pharmacy to schedule your order at [phone]  07/03/2023   Has your insurance changed since your last refill? No   Would you like a pharmacist to call you to discuss your medication(s)? No   Do you require a signature for your package? (Note: if we are billing Medicare Part B or your order contains a controlled substance, we will require a signature) No         Completed refill call assessment today to schedule patient's medication shipment from the [facility] Specialty and Home Delivery Pharmacy [phone]).  All relevant notes have been reviewed.       Confirmed patient received a Conservation officer, historic buildings and a Surveyor, mining with first shipment. The patient will receive a drug information handout for each medication shipped and additional FDA Medication Guides as required.         REFERRAL TO PHARMACIST     Referral to the pharmacist: Not needed      [facility]     Shipping address confirmed in Epic.     Delivery Scheduled: Yes, Expected medication delivery date: 07/03/23.     Medication will be delivered via Next Day Courier to the temporary address in Epic WAM.    [name]   [facility] Specialty and Home Delivery Pharmacy Specialty Technician

## 2023-07-02 MED FILL — TENOFOVIR DISOPROXIL FUMARATE 300 MG TABLET: ORAL | 30 days supply | Qty: 30 | Fill #2

## 2023-07-06 ENCOUNTER — Ambulatory Visit: Admit: 2023-07-06 | Discharge: 2023-07-07 | Payer: MEDICARE

## 2023-07-06 LAB — COMPREHENSIVE METABOLIC PANEL
ALBUMIN: 3.2 g/dL — ABNORMAL LOW (ref 3.4–5.0)
ALKALINE PHOSPHATASE: 91 U/L (ref 46–116)
ALT (SGPT): 77 U/L — ABNORMAL HIGH (ref 10–49)
ANION GAP: 9 mmol/L (ref 3–11)
AST (SGOT): 45 U/L — ABNORMAL HIGH (ref <34)
BILIRUBIN TOTAL: 0.3 mg/dL (ref 0.3–1.2)
BLOOD UREA NITROGEN: 12 mg/dL (ref 9–23)
BUN / CREAT RATIO: 14
CALCIUM: 9.1 mg/dL (ref 8.7–10.4)
CHLORIDE: 106 mmol/L (ref 98–107)
CO2: 28 mmol/L (ref 20.0–31.0)
CREATININE: 0.84 mg/dL (ref 0.73–1.18)
EGFR CKD-EPI (2021) MALE: >90 mL/min/{1.73_m2} (ref >=60)
GLUCOSE RANDOM: 195 mg/dL — ABNORMAL HIGH (ref 70–99)
POTASSIUM: 4.6 mmol/L (ref 3.5–5.1)
PROTEIN TOTAL: 5.8 g/dL (ref 5.7–8.2)
SODIUM: 143 mmol/L (ref 136–145)

## 2023-07-06 LAB — LIPID PANEL
CHOLESTEROL/HDL RATIO SCREEN: 4.7 — ABNORMAL HIGH (ref 1.0–4.5)
CHOLESTEROL: 122 mg/dL (ref <=200)
HDL CHOLESTEROL: 26 mg/dL — ABNORMAL LOW (ref 40–60)
LDL CHOLESTEROL CALCULATED: 66 mg/dL (ref 40–100)
NON-HDL CHOLESTEROL: 96 mg/dL (ref 70–130)
TRIGLYCERIDES: 150 mg/dL (ref 0–150)
VLDL CHOLESTEROL CAL: 30 mg/dL (ref 12–42)

## 2023-07-06 LAB — HEMOGLOBIN A1C
ESTIMATED AVERAGE GLUCOSE: 194 mg/dL
HEMOGLOBIN A1C: 8.4 % — ABNORMAL HIGH (ref 4.8–5.6)

## 2023-07-06 LAB — CBC W/ AUTO DIFF
BASOPHILS ABSOLUTE COUNT: 0 10*9/L (ref 0.0–0.1)
BASOPHILS RELATIVE PERCENT: 0.6 %
EOSINOPHILS ABSOLUTE COUNT: 0.1 10*9/L (ref 0.0–0.5)
EOSINOPHILS RELATIVE PERCENT: 1.9 %
HEMATOCRIT: 39.1 % (ref 39.0–48.0)
HEMOGLOBIN: 13 g/dL (ref 12.9–16.5)
LYMPHOCYTES ABSOLUTE COUNT: 1 10*9/L — ABNORMAL LOW (ref 1.1–3.6)
LYMPHOCYTES RELATIVE PERCENT: 21.6 %
MEAN CORPUSCULAR HEMOGLOBIN CONC: 33.4 g/dL (ref 32.0–36.0)
MEAN CORPUSCULAR HEMOGLOBIN: 32.4 pg (ref 25.9–32.4)
MEAN CORPUSCULAR VOLUME: 97 fL — ABNORMAL HIGH (ref 77.6–95.7)
MEAN PLATELET VOLUME: 10.5 fL (ref 6.8–10.7)
MONOCYTES ABSOLUTE COUNT: 0.4 10*9/L (ref 0.3–0.8)
MONOCYTES RELATIVE PERCENT: 8.9 %
NEUTROPHILS ABSOLUTE COUNT: 3 10*9/L (ref 1.8–7.8)
NEUTROPHILS RELATIVE PERCENT: 67 %
PLATELET COUNT: 75 10*9/L — ABNORMAL LOW (ref 150–450)
RED BLOOD CELL COUNT: 4.03 10*12/L — ABNORMAL LOW (ref 4.26–5.60)
RED CELL DISTRIBUTION WIDTH: 14.9 % (ref 12.2–15.2)
WBC ADJUSTED: 4.6 10*9/L (ref 3.6–11.2)

## 2023-07-06 LAB — AFP TUMOR MARKER: AFP-TUMOR MARKER: 2 ng/mL (ref <=8)

## 2023-07-06 LAB — PROTIME-INR
INR: 1.13 (ref <=5.00)
PROTIME: 12.9 s — ABNORMAL HIGH (ref 9.9–12.6)

## 2023-07-06 LAB — TSH: THYROID STIMULATING HORMONE: 3.642 u[IU]/mL (ref 0.550–4.780)

## 2023-07-06 LAB — LITHIUM LEVEL: LITHIUM LEVEL: 1.2 mmol/L (ref 1.0–1.2)

## 2023-07-06 LAB — PSA: PROSTATE SPECIFIC ANTIGEN: 2.33 ng/mL (ref 0.00–4.00)

## 2023-07-09 LAB — HEPATITIS B DNA, QUANTITATIVE, PCR
HBV DNA QUANT: DETECTED — AB
HBV DNA: <10 [IU]/mL — ABNORMAL HIGH (ref <=0)

## 2023-07-10 LAB — HEPATITIS B E ANTIGEN: HEPATITIS BE ANTIGEN: NEGATIVE

## 2023-07-10 LAB — HEPATITIS B E ANTIBODY: HEPATITIS BE ANTIBODY: NEGATIVE

## 2023-07-17 NOTE — Unmapped (Signed)
 I called the patient to inform them of their 07/18/23 appointment cancellation with Dr. Langston Masker (Bump Prov - Out sick). If patient calls in, please reschedule that appointment to Dr. Lilyan Punt next opening (3/31) OR if they need a sooner appointment, please offer the following per provider in basket 3/11:    3/19 (Wednesday) at 3 or 4:30 PM  3/20 (Thursday) at 8 AM  3/21 (Friday) 10-11 AM    Thanks!

## 2023-07-24 NOTE — Unmapped (Addendum)
 [facility] Specialty and Home Delivery Pharmacy Refill Coordination Note    Nicholas [name], [place]: 1956/04/13  Phone: [phone] (home) [phone] (work)      All above HIPAA information was verified with patient.         07/23/2023     4:05 PM   Specialty Rx Medication Refill Questionnaire   Which Medications would you like refilled and shipped? Tenofior (generic Vemlidy)   Please list all current allergies: On file   Have you missed any doses in the last 30 days? No   Have you had any changes to your medication(s) since your last refill? No   How many days remaining of each medication do you have at home? 10   Have you experienced any side effects in the last 30 days? No   Please enter the full address (street address, city, state, zip code) where you would like your medication(s) to be delivered to. Nicholas [name], [address] [place], [place] [address]   Please specify on which day you would like your medication(s) to arrive. Note: if you need your medication(s) within 3 days, please call the pharmacy to schedule your order at [phone]  07/30/2023   Has your insurance changed since your last refill? No   Would you like a pharmacist to call you to discuss your medication(s)? No   Do you require a signature for your package? (Note: if we are billing Medicare Part B or your order contains a controlled substance, we will require a signature) No   I have been provided my out of pocket cost for my medication and approve the pharmacy to charge the amount to my credit card on file. Yes         Completed refill call assessment today to schedule patient's medication shipment from the [facility] and Home Delivery Pharmacy [phone]).  All relevant notes have been reviewed.       Confirmed patient received a Conservation officer, historic buildings and a Surveyor, mining with first shipment. The patient will receive a drug information handout for each medication shipped and additional FDA Medication Guides as required. REFERRAL TO PHARMACIST     Referral to the pharmacist: Not needed      [facility]     Shipping address confirmed in Epic.     Delivery Scheduled: Yes, Expected medication delivery date: 07/30/2023.     Medication will be delivered via Same Day Courier to the temporary address in Epic WAM.    Delaine Lame   [facility] Specialty and Home Delivery Pharmacy Specialty Technician

## 2023-07-30 MED FILL — TENOFOVIR DISOPROXIL FUMARATE 300 MG TABLET: ORAL | 30 days supply | Qty: 30 | Fill #3

## 2023-08-15 ENCOUNTER — Other Ambulatory Visit: Payer: Self-pay | Admitting: "Endocrinology

## 2023-08-20 NOTE — Unmapped (Signed)
 [facility] Specialty and Home Delivery Pharmacy Clinical Assessment & Refill Coordination Note    Nicholas [name], DOB: May 31, 1955  Phone: [phone] (home) [phone] (work)    All above HIPAA information was verified with patient.     Was a Nurse, learning disability used for this call? No    Specialty Medication(s):   Infectious Disease: tenofovir  disoproxil fumarate     Current Outpatient Medications   Medication Sig Dispense Refill    ALPRAZolam  (XANAX ) 0.5 MG tablet Take 1 pill 1 hour before MRI, take second pill 20 minutes before MRI 2 tablet 0    glipiZIDE (GLUCOTROL XL) 5 MG 24 hr tablet Take 1 tablet (5 mg total) by mouth in the morning.      lipase-protease-amylase (PANCREAZE) 37,000-97,300- 149,900 unit CpDR 3 caps with each meal and 2 with snacks      lithium  (LITHOBID ) 450 MG ER tablet TAKE 1 TABLET BY MOUTH EVERY MORNING AND 2 TABLETS EVERY EVENING 90 tablet 2    melatonin 10 mg cap Take 1 capsule (10 mg total) by mouth nightly.      nadolol  (CORGARD ) 20 MG tablet Take 1 tablet (20 mg total) by mouth daily. 90 tablet 1    tenofovir  disoproxil (VIREAD ) 300 mg tablet Take 1 tablet (300 mg total) by mouth daily. 30 tablet 5    venlafaxine  (EFFEXOR -XR) 150 MG 24 hr capsule Take 1 capsule (150 mg total) by mouth daily. 90 capsule 0    venlafaxine  (EFFEXOR -XR) 75 MG 24 hr capsule Take 2 capsules (150 mg total) by mouth daily. 180 capsule 0     No current facility-administered medications for this visit.        Changes to medications: Keonte reports no changes at this time.    Medication list has been reviewed and updated in Epic: Yes    Allergies   Allergen Reactions    Aripiprazole Other (See Comments)     Tardive Dyskinesia    Invega [Paliperidone] Other (See Comments)     Tardive dyskinsia symptoms    Hydrocodone Itching    Lamotrigine Rash    Quetiapine Rash       Changes to allergies: No    Allergies have been reviewed and updated in Epic: Yes    SPECIALTY MEDICATION ADHERENCE     Tenofovir  disoproxil 300 mg: 15 days of medicine on hand       Medication Adherence    Patient reported X missed doses in the last month: 0  Specialty Medication: tenofovir  disoproxil 300mg  QD  Patient is on additional specialty medications: No  Informant: patient          Specialty medication(s) dose(s) confirmed: Regimen is correct and unchanged.     Are there any concerns with adherence? No    Adherence counseling provided? Not needed    CLINICAL MANAGEMENT AND INTERVENTION      Clinical Benefit Assessment:    Do you feel the medicine is effective or helping your condition? Yes    Clinical Benefit counseling provided? Labs from 06/2023 show evidence of clinical benefit    Adverse Effects Assessment:    Are you experiencing any side effects? No    Are you experiencing difficulty administering your medicine? No    Quality of Life Assessment:    Quality of Life    Rheumatology  Oncology  Dermatology  Cystic Fibrosis          How many days over the past month did your Hep B  keep you from  your normal activities? For example, brushing your teeth or getting up in the morning. Patient declined to answer    Have you discussed this with your provider? Not needed    Acute Infection Status:    Acute infections noted within Epic:  No active infections    Patient reported infection: None    Therapy Appropriateness:    Is therapy appropriate based on current medication list, adverse reactions, adherence, clinical benefit and progress toward achieving therapeutic goals? Yes, therapy is appropriate and should be continued     Clinical Intervention:    Was an intervention completed as part of this clinical assessment? No    DISEASE/MEDICATION-SPECIFIC INFORMATION      N/A    Hepatitis B: Not Applicable      HEPATITIS B AND ASSOCIATED LABS:       Lab Results   Component Value Date/Time    HBVDNAQT Detected (A) 07/06/2023 12:21 PM    HBVDNAQT Not Detected 10/24/2022 05:00 PM    HBVDNAQT Detected (A) 02/09/2022 02:35 PM    HBVDNACP <10 (H) 07/06/2023 12:21 PM HBVDNACP <10 (H) 02/09/2022 02:35 PM    HBVD10  07/06/2023 12:21 PM      Comment:      <1.00 log    HBVD10  02/09/2022 02:35 PM      Comment:      <1.00 log    HBEAG Negative 07/06/2023 12:21 PM    HBEAG Negative 10/24/2022 05:00 PM    HBEAG Negative 02/09/2022 02:35 PM    ALT 77 (H) 07/06/2023 12:21 PM    ALT 64 (H) 10/24/2022 05:00 PM    ALT 78 02/09/2022 02:35 PM    ALT 42 10/30/2019 11:15 AM    ALT 65 (H) 05/06/2019 01:33 PM    ALT 25 02/27/2019 04:04 PM    AST 45 (H) 07/06/2023 12:21 PM    AST 35 10/30/2019 11:15 AM    CREATININE 0.84 07/06/2023 12:21 PM    CREATININE 0.75 10/24/2022 05:00 PM    CREATININE 0.96 06/21/2022 09:42 AM    CREATININE 0.88 10/30/2019 11:15 AM    CREATININE 0.9 10/01/2019 10:00 AM    CREATININE 0.73 (L) 05/06/2019 01:33 PM    CREATININE 1.0 04/15/2019 10:26 AM    CREATININE 1.02 02/27/2019 04:04 PM    CREATININE 0.9 02/28/2018 12:38 PM    CREATININE 0.9 06/05/2014 10:09 AM         PATIENT SPECIFIC NEEDS     Does the patient have any physical, cognitive, or cultural barriers? No    Is the patient high risk? No    Does the patient require physician intervention or other additional services (i.e., nutrition, smoking cessation, social work)? No    Does the patient have an additional or emergency contact listed in their chart? Yes    SOCIAL DETERMINANTS OF HEALTH     At the [facility], we have learned that life circumstances - like trouble affording food, housing, utilities, or transportation can affect the health of many of our patients.   That is why we wanted to ask: are you currently experiencing any life circumstances that are negatively impacting your health and/or quality of life? Patient declined to answer    Social Drivers of Health     Food Insecurity: Not on file   Tobacco Use: Medium Risk (12/12/2022)    Received from [facility]    Patient History     Smoking Tobacco Use: Former     Smokeless Tobacco Use:  Never     Passive Exposure: Not on file   Transportation Needs: Not on file   Alcohol Use: Not on file   Housing: Not on file   Physical Activity: Not on file   Utilities: Not on file   Stress: Not on file   Interpersonal Safety: Not on file   Substance Use: Not on file (03/13/2023)   Intimate Partner Violence: Not on file   Social Connections: Not on file   Financial Resource Strain: Not on file   Depression: Not at risk (03/18/2020)    Received from [facility] visits prior to 07/08/2022., [facility] visits prior to 07/08/2022.    PHQ-2     SDOH PHQ2 SCORE: 0   Internet Connectivity: Not on file   Health Literacy: Not on file       Would you be willing to receive help with any of the needs that you have identified today? Not applicable       SHIPPING     Specialty Medication(s) to be Shipped:   Infectious Disease: tenofovir  disoproxil fumarate    Other medication(s) to be shipped: No additional medications requested for fill at this time     Changes to insurance: No    Cost and Payment: Patient has a copay of $28.15. They are aware and have authorized the pharmacy to charge the credit card on file.    Delivery Scheduled: Yes, Expected medication delivery date: 08/28/23.     Medication will be delivered via UPS to the confirmed prescription address in [facility].    The patient will receive a drug information handout for each medication shipped and additional FDA Medication Guides as required.  Verified that patient has previously received a Conservation officer, historic buildings and a Surveyor, mining.    The patient or caregiver noted above participated in the development of this care plan and knows that they can request review of or adjustments to the care plan at any time.      All of the patient's questions and concerns have been addressed.    Riesa Chary, PharmD   [facility] Specialty and Home Delivery Pharmacy Specialty Pharmacist

## 2023-08-21 ENCOUNTER — Other Ambulatory Visit: Payer: Self-pay | Admitting: "Endocrinology

## 2023-08-23 MED ORDER — LITHIUM CARBONATE ER 450 MG TABLET,EXTENDED RELEASE
ORAL_TABLET | 2 refills | 0.00 days | Status: CP
Start: 2023-08-23 — End: ?

## 2023-08-27 ENCOUNTER — Encounter
Admit: 2023-08-27 | Discharge: 2023-08-28 | Payer: Medicare (Managed Care) | Attending: Student in an Organized Health Care Education/Training Program | Primary: Student in an Organized Health Care Education/Training Program

## 2023-08-27 DIAGNOSIS — Z79899 Other long term (current) drug therapy: Principal | ICD-10-CM

## 2023-08-27 DIAGNOSIS — F310 Bipolar disorder, current episode hypomanic: Principal | ICD-10-CM

## 2023-08-27 DIAGNOSIS — G2401 Drug induced subacute dyskinesia: Principal | ICD-10-CM

## 2023-08-27 MED ORDER — VENLAFAXINE ER 75 MG CAPSULE,EXTENDED RELEASE 24 HR
ORAL_CAPSULE | Freq: Every day | ORAL | 0 refills | 90.00 days | Status: CP
Start: 2023-08-27 — End: 2023-11-25

## 2023-08-27 MED FILL — TENOFOVIR DISOPROXIL FUMARATE 300 MG TABLET: ORAL | 30 days supply | Qty: 30 | Fill #4

## 2023-08-27 NOTE — Unmapped (Signed)
 [facility]  Psychiatry   Established Patient E&M Service - Outpatient       Assessment:    Nicholas [name] presents for follow-up evaluation. Today, [name] is doing well overall. Since our last visit mood has been good, he is enjoying things. He denies any recent concerns for symptoms of mania/hypomania. Sleep is fair. He is waking up to go to the bathroom multiple times overnight, but this has improved from prior. He thinks that TD has improved w/ transition from Zyprexa  to Vraylar . Does still come up at times, but seems to have lessened in frequency. Patient is not typically aware of these movements and they are not bothersome at present. Plan for now is continuing current regimen given mood stability and good tolerability of medications. Discussed elevated lithium  level today. Mr. Woldt reports this was drawn after taking his AM medication dose so likely does not represent a true trough level. He denies any sx of lithium  toxicity. Previously had fairly stable level (0.8-0.9) which is also reassuring. Discussed need to repeat level as trough (prior to AM lithium  dose). Would need to lower lithium  dose if level remains elevated. Discussed increased risk for SE w/ level above 1.0. Patient agreeable to getting repeat level soon. Will plan for follow-up in ~1 month.      Identifying Information:  Nicholas [name] is a 68 y.o. male with a history of  Bipolar I Disorder that was diagnosed in 2003. He established with this clinic in September 2022. At that time, he was prescribed vraylar , lithium , doxepin , and xanax  with fairly good efficacy and tolerability.     Risk Assessment:  A full psychiatric risk assessment was conducted on 01/31/21 and risks do not appear significantly changed from that visit.   While future psychiatric events cannot be accurately predicted, the patient does not currently require acute inpatient psychiatric care and does not currently meet [place]  involuntary commitment criteria.     Plan:    #Bipolar I Disorder:  --Continue Lithium  ER 450 mg QAM, 900 mg at bedtime. Level of 0.9 in February 2024.   --Discontinued Zyprexa  in February 2025.  --Continue Vraylar  1.5 mg daily for now. Consider dose increase (previously on 3 mg). Pt reports starting about 1 month ago.   --Continue hydroxyzine  25 mg BID PRN.   --Continue Effexor  225 mg daily (s 4/11,i5/12, I5/22, i11/6/23, d8/9/24). Did previously have mania on 225 (though this was in context of significant stressors). Has had prolonged period w/o mania at this dose in the past. Depression generally seems to be more well-controlled at this dose compared to 150 mg.   --Previously discussed behavioral activation. Have previously discussed getting more plugged in with local community (maybe a church).  --Recommend discussing PT with PCP to help w/ his unsteadiness on feet/hoping this could increase activity level.  --Consider therapy, previously referred to resident CBT but patient never scheduled.     #Medication monitoring:   --Recent metabolic labs w/ elevation in Z6X (pt does have DM). Encouraged pt to follow-up with PCP +/- endocrinology. Patient had lithium  level of 1.2 on 2/28 -- this was collected after AM dose (around 12 PM). This likely does not represent true trough level. No signs of lithium  toxicity. Discussed need for follow-up level asap. Ordered today.  --Discussed need for in-person visit for repeat AIMS  --AIMS on 06/19/22 of 2 (scanned to media). Scored for movements in face, mouth, jaw, tongue. Had significant twitching in upper face, tongue movements (though not  observed w/ opening but apparent throughout interview and with distraction), puckering. Patient also made frequent vocalizations which he reported no awareness of.    Lab Results   Component Value Date    Cholesterol 122 07/06/2023    LDL Calculated 66 07/06/2023    HDL 26 (L) 07/06/2023    Triglycerides 150 07/06/2023     Hemoglobin A1C:   Lab Results   Component Value Date    Hemoglobin A1C 8.4 (H) 07/06/2023     Lithium  Lvl   Date Value Ref Range Status   07/06/2023 1.2 1.0 - 1.2 mmol/L Final     TSH   Date Value Ref Range Status   07/06/2023 3.642 0.550 - 4.780 uIU/mL Final     Creatinine Whole Blood, POC   Date Value Ref Range Status   10/01/2019 0.9 0.8 - 1.4 mg/dL Final   16/02/9603 0.9 0.8 - 1.4 mg/dL Final     Creatinine   Date Value Ref Range Status   07/06/2023 0.84 0.73 - 1.18 mg/dL Final   54/01/8118 1.47 0.76 - 1.27 mg/dL Final       Psychotherapy provided:  No billable psychotherapy service provided.    Patient has been given this writer's contact information as well as the [facility] Psychiatry urgent line number. The patient has been instructed to call 911 for emergencies.    Subjective:    Interval History:Pt reports that he is doing pretty good. States that he and his husband are preparing to move back to [place] permanently in early May. Nicholas [name] will be working in [place] again. Thinks that this change will be a positive thing (decreasing financial stress). They have been spending much of their time in [place] lately.    Reports that in general his mood has been good.    He is taking 150 mg effexor  right now (just went down d/t getting that refilled at the pharmacy). He thinks that 225 mg has been better than 150 mg in terms of helping w/ his mood. Will plan to increase back to 225 mg.     They just got a new puppy a mini schnauzer (she is 108 weeks old, Rosie).     He is enjoying things lately. Has been walking a bit more.     Sleep is pretty good. Notes that he will wake up and go to the bathroom after 2 hrs. He is taking a medication for frequent urination, which is helping some. Gets up less frequently overnight. He typically gets 8-9 hrs of sleep nightly.    Denies any recent concerns for mania. Denies any irritability, mood elevation.    Reviewed medication list -- no recent SE. Doesn't miss doses. His husband fills a pill box for him.    Medications are working well.    He thinks that grunting, TD has gotten better recently (since switching to Vraylar ). He isn't noticing it as often. His husband comments on it less often. This hasn't been bothersome lately. He doesn't notice when it's happens (just notices it when his husband points it out to him).    Thinks he took his lithium  dose prior to getting the recent level.  Denies any tremor, confusion.    Discussed POC (see assessment above for details). No other Q's or concerns today.        Objective:    Mental Status Exam:  Appearance:    Appears stated age, Well nourished, Well developed and Clean/Neat   Motor:   Some twitching in upper  face, and around mouth. No other abnormal movements noted on interview.   Speech/Language:    Normal rate, volume, tone, fluency and Language intact, well formed. No grunting or other sounds on interview today   Mood:   Pretty good   Affect:   Generally euthymic.   Thought process and Associations:   Logical, linear, clear, coherent, goal directed   Abnormal/psychotic thought content:    Doing well, not endorsing SI. Remains future oriented. Not voicing any bizarre or delusional thought content on interview.   Perceptual disturbances:      He is not endorsing any AVH. No RIS on interview.     Other:          I personally spent 32 minutes face-to-face and non-face-to-face in the care of this patient, which includes all pre, intra, and post visit time on the date of service.  All documented time was specific to the E/M visit and does not include any procedures that may have been performed.        The patient reports they are physically located in [place]  and is currently: at home. I conducted a audio/video visit. I spent  69m 23s on the video call with the patient. I spent an additional 10 minutes on pre- and post-visit activities on the date of service .       Emaline Handsome, MD

## 2023-08-28 ENCOUNTER — Ambulatory Visit: Admit: 2023-08-28 | Discharge: 2023-08-29 | Payer: Medicare (Managed Care)

## 2023-08-28 LAB — LITHIUM LEVEL: LITHIUM LEVEL: 0.7 mmol/L — ABNORMAL LOW (ref 1.0–1.2)

## 2023-09-11 DIAGNOSIS — I8510 Secondary esophageal varices without bleeding: Principal | ICD-10-CM

## 2023-09-11 DIAGNOSIS — B181 Chronic viral hepatitis B without delta-agent: Principal | ICD-10-CM

## 2023-09-11 DIAGNOSIS — K766 Portal hypertension: Principal | ICD-10-CM

## 2023-09-11 DIAGNOSIS — K7469 Other cirrhosis of liver: Principal | ICD-10-CM

## 2023-09-11 MED ORDER — NADOLOL 20 MG TABLET
ORAL_TABLET | Freq: Every day | ORAL | 1 refills | 90.00000 days | Status: CP
Start: 2023-09-11 — End: 2024-03-09

## 2023-09-11 NOTE — Unmapped (Signed)
 Refill request from [facility] in [place], [place] for Nadolol     LCV 10/26/22    Labs done 07/06/23    Pt missed his December 2024 appointment--> recall placed for pt to receive a reminder to make another appointment    Will authorize refill at this time

## 2023-09-18 NOTE — Unmapped (Signed)
 [facility] Specialty and Home Delivery Pharmacy Refill Coordination Note    Nicholas [name], [place]: 09/07/1955  Phone: [phone] (home) [phone] (work)      All above HIPAA information was verified with patient.         09/17/2023     4:26 PM   Specialty Rx Medication Refill Questionnaire   Which Medications would you like refilled and shipped? Tenevir (generic to [facility] )   Please list all current allergies: On file   Have you missed any doses in the last 30 days? No   Have you had any changes to your medication(s) since your last refill? No   How many days remaining of each medication do you have at home? 10   Have you experienced any side effects in the last 30 days? No   Please enter the full address (street address, city, state, zip code) where you would like your medication(s) to be delivered to. [address] [place], [place] [address]   Please specify on which day you would like your medication(s) to arrive. Note: if you need your medication(s) within 3 days, please call the pharmacy to schedule your order at [phone]  09/24/2023   Has your insurance changed since your last refill? No   Would you like a pharmacist to call you to discuss your medication(s)? No   Do you require a signature for your package? (Note: if we are billing Medicare Part B or your order contains a controlled substance, we will require a signature) No   I have been provided my out of pocket cost for my medication and approve the pharmacy to charge the amount to my credit card on file. Yes         Completed refill call assessment today to schedule patient's medication shipment from the [facility] and Home Delivery Pharmacy [phone]).  All relevant notes have been reviewed.       Confirmed patient received a Conservation officer, historic buildings and a Surveyor, mining with first shipment. The patient will receive a drug information handout for each medication shipped and additional FDA Medication Guides as required.         REFERRAL TO PHARMACIST     Referral to the pharmacist: Not needed      [facility]     Shipping address confirmed in Epic.     Delivery Scheduled: Yes, Expected medication delivery date: 09/24/23.     Medication will be delivered via UPS to the prescription address in Epic WAM.    Riesa Chary, PharmD   [facility] Specialty and Home Delivery Pharmacy Specialty Pharmacist

## 2023-09-21 MED FILL — TENOFOVIR DISOPROXIL FUMARATE 300 MG TABLET: ORAL | 30 days supply | Qty: 30 | Fill #5

## 2023-09-24 ENCOUNTER — Encounter
Admit: 2023-09-24 | Discharge: 2023-09-25 | Payer: Medicare (Managed Care) | Attending: Student in an Organized Health Care Education/Training Program | Primary: Student in an Organized Health Care Education/Training Program

## 2023-09-24 DIAGNOSIS — Z79899 Other long term (current) drug therapy: Principal | ICD-10-CM

## 2023-09-24 DIAGNOSIS — F310 Bipolar disorder, current episode hypomanic: Principal | ICD-10-CM

## 2023-09-24 DIAGNOSIS — G2401 Drug induced subacute dyskinesia: Principal | ICD-10-CM

## 2023-09-24 NOTE — Unmapped (Signed)
 [facility] Health Care  Psychiatry   Established Patient E&M Service - Outpatient       Assessment:    Nicholas [name] presents for follow-up evaluation. Today, Nicholas [name] is stable psychiatrically. No major concerns for depressive symptoms nor any recent symptoms of mania or hypomania. Notes that he has not been bothered by any TD lately. His husband continues to notice some movements and vocalizations, but Nicholas [name] is not aware of them. On my observation, there are no vocalizations and mouth/tongue movements are very minimal or absent. He is taking medications regularly and tolerating them well. No planned medication changes today given ongoing stability. Pt is aware of need for in-person visit for updated AIMS.    Identifying Information:  Nicholas [name] is a 68 y.o. male with a history of  Bipolar I Disorder that was diagnosed in 2003. He established with this clinic in September 2022. At that time, he was prescribed vraylar , lithium , doxepin , and xanax  with fairly good efficacy and tolerability.     Risk Assessment:  A full psychiatric risk assessment was conducted on 01/31/21 and risks do not appear significantly changed from that visit.   While future psychiatric events cannot be accurately predicted, the patient does not currently require acute inpatient psychiatric care and does not currently meet [place]  involuntary commitment criteria.     Plan:    #Bipolar I Disorder:  --Continue Lithium  ER 450 mg QAM, 900 mg at bedtime. Level of 0.7 in April 2025.  --Discontinued Zyprexa  in February 2025.  --Continue Vraylar  1.5 mg daily for now. Consider dose increase (previously on 3 mg).   --Continue hydroxyzine  25 mg BID PRN.   --Continue Effexor  225 mg daily (s 4/11,i5/12, I5/22, i11/6/23, d8/9/24). Did previously have mania on 225 (though this was in context of significant stressors). Has had prolonged period w/o mania at this dose in the past. Depression generally seems to be more well-controlled at this dose compared to 150 mg.   --Consider therapy, previously referred to resident CBT but patient never scheduled.     #Medication monitoring:   --Recent metabolic labs w/ elevation in Z6X (pt does have DM). Encouraged pt to follow-up with PCP +/- endocrinology.  --Given age will plan to repeat lithium  labs Q75months (aim for repeat in August 2025)  --Discussed need for in-person visit for repeat AIMS  --AIMS on 06/19/22 of 2 (scanned to media). Scored for movements in face, mouth, jaw, tongue. Had significant twitching in upper face, tongue movements (though not observed w/ opening but apparent throughout interview and with distraction), puckering. Patient also made frequent vocalizations which he reported no awareness of.    Lab Results   Component Value Date    Cholesterol, Total 122 07/06/2023    Cholesterol, LDL, Calculated 66 07/06/2023    Cholesterol, HDL 26 (L) 07/06/2023    Triglycerides 150 07/06/2023     Hemoglobin A1C:   Lab Results   Component Value Date    Hemoglobin A1C 8.4 (H) 07/06/2023     Lithium  Lvl   Date Value Ref Range Status   08/28/2023 0.7 (L) 1.0 - 1.2 mmol/L Final     TSH   Date Value Ref Range Status   07/06/2023 3.642 0.550 - 4.780 uIU/mL Final     Creatinine Whole Blood, POC   Date Value Ref Range Status   10/01/2019 0.9 0.8 - 1.4 mg/dL Final   09/60/4540 0.9 0.8 - 1.4 mg/dL Final     Creatinine   Date Value  Ref Range Status   07/06/2023 0.84 0.73 - 1.18 mg/dL Final   56/38/7564 3.32 0.76 - 1.27 mg/dL Final       Psychotherapy provided:  No billable psychotherapy service provided.    Patient has been given this writer's contact information as well as the [facility] Psychiatry urgent line number. The patient has been instructed to call 911 for emergencies.    Subjective:    Interval History: Pt reports that he is doing well. He is in the process of moving back to [place]. That has been stressful.     Mood is pretty good. States that he isn't doing too much fun stuff right now. He is still interested in things.    His sleep is the same. States that he wakes up every 90-120 minutes. He gets back to sleep easily when he gets up. He is getting up to go to the bathroom mostly. Has been sleeping from 11-5. Then he usually gets back to sleep from 7-11 AM. Gets ~10 hrs of sleep total. He feels pretty well rested.     He is trying to get things done. Feels like concentration can be a challenge. He notes that when he needs to sit down to focus he will get up and do something else that's fun or not a chore. No recent change in attention/focus. No problems w/ memory.    No recent irritability, mood elevation, or mania sx. No decreased need to sleep.     His husband thinks TD is about the same. [name] thinks TD is better. He isn't bothered by TD. His husband says he is still having some vocalizations which can be bothersome to him.    No recent SI.    Recently started flomax, which has helped a little bit w/ overnight urination.    Reviewed medication list -- not taking any hydroxyzine , hasn't taken it in 6 months. No recent SE. Misses doses very infrequently (1x per month).    He now has 3 dogs. His newest dog is ~4 months old. The new dog is Rosie. He does get out and walk w/ the dogs. He also spends time outside w/ the dogs on his porch.     Discussed POC (see assessement above for details). No other Q's or concerns today.      Objective:    Mental Status Exam:  Appearance:    Appears stated age, Well nourished, Well developed and Clean/Neat   Motor:  Possible tongue protrusion which is infrequent and may be volitional. No other abnormal movements noted on interview.   Speech/Language:    Normal rate, volume, tone, fluency and Language intact, well formed. No grunting or other sounds on interview today   Mood:   Pretty good   Affect:   Generally euthymic.   Thought process and Associations:   Logical, linear, clear, coherent, goal directed   Abnormal/psychotic thought content:    Doing well, denies SI. Remains future oriented. Not voicing any bizarre or delusional thought content on interview.   Perceptual disturbances:      He is not endorsing any AVH. No RIS on interview.     Other:          I personally spent 25 minutes face-to-face and non-face-to-face in the care of this patient, which includes all pre, intra, and post visit time on the date of service.  All documented time was specific to the E/M visit and does not include any procedures that may have been performed.  The patient reports they are physically located in [place]  and is currently: at home. I conducted a audio/video visit. I spent  56m 45s on the video call with the patient. I spent an additional 10 minutes on pre- and post-visit activities on the date of service .     Emaline Handsome, MD

## 2023-09-24 NOTE — Unmapped (Signed)
 Follow-up instructions:  --Please talk to your husband and schedule an in-person visit soon.  -- Please continue taking your medications as prescribed for your mental health.   -- Do not make changes to your medications, including taking more or less than prescribed, unless under the supervision of your physician. Be aware that some medications may make you feel worse if abruptly stopped  -- Please refrain from using illicit substances, as these can affect your mood and could cause anxiety or other concerning symptoms.   -- Seek further medical care for any increase in symptoms or new symptoms such as thoughts of wanting to hurt yourself or hurt others.     Contact info:  Life-threatening emergencies: call 911 or go to the nearest ER for medical or psychiatric attention.     Issues that need urgent attention but are not life threatening: call the clinic outpatient frontdesk at [phone] for assistance.     Non-urgent routine concerns, questions, and refill requests: please send me a mychart message and I will get back to you within 2 business days. If you prefer to call, please call the front desk at [phone].    Regarding appointments:  - If you need to cancel your appointment, we ask that you call [phone] at least 24 hours before your scheduled appointment.  - If for any reason you arrive 15 minutes later than your scheduled appointment time, you may not be seen and your visit may be rescheduled.  - Please remember that we will not automatically reschedule missed appointments.  - If you miss two (2) appointments without letting us  know in advance, you will likely be referred to a provider in your community.  - We will do our best to be on time. Sometimes an emergency will arise that might cause your clinician to be late. We will try to inform you of this when you check in for your appointment. If you wait more than 15 minutes past your appointment time without such notice, please speak with the front desk staff.    In the event of bad weather, the clinic staff will attempt to contact you, should your appointment need to be rescheduled. Additionally, you can call the Patient Weather Line [phone] for system-wide clinic status    For more information and reminders regarding clinic policies (these were provided when you were admitted to the clinic), please ask the front desk.

## 2023-10-16 DIAGNOSIS — B181 Chronic viral hepatitis B without delta-agent: Principal | ICD-10-CM

## 2023-10-16 MED ORDER — TENOFOVIR DISOPROXIL FUMARATE 300 MG TABLET
ORAL_TABLET | Freq: Every day | ORAL | 0 refills | 90.00000 days | Status: CP
Start: 2023-10-16 — End: ?
  Filled 2023-10-23: qty 30, 30d supply, fill #0

## 2023-10-16 NOTE — Unmapped (Signed)
 Refill request for Viread     Last clinic visit: 10/26/22    Labs done 07/06/23    Pt needs a follow-up appointment--> will requests that the schedulers contact him    Will authorize refill at this time    [facility]

## 2023-10-17 NOTE — Unmapped (Signed)
 10/17/2023 - Patient originally requested delivery for 10/22/2023. Delivery is not possible on this date due to no weekend deliveries. I have reached out to the patient and confirmed that delivery on 10/23/2023 is ok.    [facility] Specialty and Home Delivery Pharmacy Refill Coordination Note    Nicholas [name], [place]: Aug 18, 1955  Phone: [phone] (home) [phone] (work)      All above HIPAA information was verified with patient.         10/15/2023     8:57 AM   Specialty Rx Medication Refill Questionnaire   Which Medications would you like refilled and shipped? Tenafir (generic to [facility] )   Please list all current allergies: On file   Have you missed any doses in the last 30 days? No   Have you had any changes to your medication(s) since your last refill? No   How many days remaining of each medication do you have at home? 10   Have you experienced any side effects in the last 30 days? No   Please enter the full address (street address, city, state, zip code) where you would like your medication(s) to be delivered to. [address], [place], [place] [address]   Please specify on which day you would like your medication(s) to arrive. Note: if you need your medication(s) within 3 days, please call the pharmacy to schedule your order at [phone]  10/22/2023   Has your insurance changed since your last refill? No   Would you like a pharmacist to call you to discuss your medication(s)? No   Do you require a signature for your package? (Note: if we are billing Medicare Part B or your order contains a controlled substance, we will require a signature) No   I have been provided my out of pocket cost for my medication and approve the pharmacy to charge the amount to my credit card on file. Yes         Completed refill call assessment today to schedule patient's medication shipment from the [facility] and Home Delivery Pharmacy [phone]).  All relevant notes have been reviewed.       Confirmed patient received a Conservation officer, historic buildings and a Surveyor, mining with first shipment. The patient will receive a drug information handout for each medication shipped and additional FDA Medication Guides as required.         REFERRAL TO PHARMACIST     Referral to the pharmacist: Not needed      [facility]     Shipping address confirmed in Epic.     Delivery Scheduled: Yes, Expected medication delivery date: 10/23/2023.     Medication will be delivered via UPS to the prescription address in Epic WAM.    Lanny Plan   [facility] Specialty and Home Delivery Pharmacy Specialty Technician

## 2023-10-17 NOTE — Unmapped (Signed)
 Hello, pt is scheduled for 09/15  @ 3:20

## 2023-10-22 NOTE — Unmapped (Signed)
 Nicholas [name] 's Tenofovir  shipment will be delayed as a result of credit card ending in [id number] declined     I have reached out to the patient  at [phone] and was unable to leave a message. We will wait for a call back from the patient to reschedule the delivery.  We have not confirmed the new delivery date.

## 2023-11-15 DIAGNOSIS — B181 Chronic viral hepatitis B without delta-agent: Principal | ICD-10-CM

## 2023-11-15 MED ORDER — TENOFOVIR DISOPROXIL FUMARATE 300 MG TABLET
ORAL_TABLET | Freq: Every day | ORAL | 0 refills | 90.00000 days
Start: 2023-11-15 — End: ?

## 2023-11-16 MED ORDER — TENOFOVIR DISOPROXIL FUMARATE 300 MG TABLET
ORAL_TABLET | Freq: Every day | ORAL | 0 refills | 90.00000 days | Status: CP
Start: 2023-11-16 — End: ?
  Filled 2023-12-12: qty 30, 30d supply, fill #0

## 2023-11-16 NOTE — Unmapped (Signed)
 Refill request for Viread     Last clinic visit 10/26/22    Labs done 07/06/23    Next visit scheduled for 01/21/24    Will authorize refill at this time

## 2023-11-19 DIAGNOSIS — F3130 Bipolar disorder, current episode depressed, mild or moderate severity, unspecified: Principal | ICD-10-CM

## 2023-11-19 DIAGNOSIS — Z79899 Other long term (current) drug therapy: Principal | ICD-10-CM

## 2023-11-19 MED ORDER — VENLAFAXINE ER 75 MG CAPSULE,EXTENDED RELEASE 24 HR
ORAL_CAPSULE | Freq: Every day | ORAL | 0 refills | 90.00000 days | Status: CP
Start: 2023-11-19 — End: 2024-02-17

## 2023-11-19 MED ORDER — LITHIUM CARBONATE ER 450 MG TABLET,EXTENDED RELEASE
ORAL_TABLET | Freq: Every evening | ORAL | 2 refills | 0.00000 days | Status: CP
Start: 2023-11-19 — End: 2023-11-19

## 2023-11-19 MED ORDER — TRAZODONE 50 MG TABLET
ORAL_TABLET | Freq: Every evening | ORAL | 0 refills | 60.00000 days | Status: CP | PRN
Start: 2023-11-19 — End: 2024-01-18

## 2023-11-19 NOTE — Unmapped (Addendum)
 Follow-up instructions:  --CHANGE lithium  to 3 tablet (1350 mg) all at bedtime. Do not take any in the morning. The idea here is to change the timing of your lithium , but not increase your total dose.  --You can try taking trazodone  50 mg nightly for sleep. Let me know if you have problems tolerating this medication.  --I would recommend stopping drinking 1-2 hours before bed and not drinking water when you wake up overnight. Let me know if you have problems with dry mouth.  --Please get a lithium  level at your doctor's visit today. I would also like you to get a lithium  level and other labs at a [facility] facility before our next visit. For the lithium  level, please get it drawn about 12 hours after your evening dose. It's OK if it's off by an hour or two in either direction.  -- Please continue taking your medications as prescribed for your mental health.   -- Do not make changes to your medications, including taking more or less than prescribed, unless under the supervision of your physician. Be aware that some medications may make you feel worse if abruptly stopped  -- Please refrain from using illicit substances, as these can affect your mood and could cause anxiety or other concerning symptoms.   -- Seek further medical care for any increase in symptoms or new symptoms such as thoughts of wanting to hurt yourself or hurt others.     Contact info:  Life-threatening emergencies: call 911 or go to the nearest ER for medical or psychiatric attention.     Issues that need urgent attention but are not life threatening: call the clinic outpatient frontdesk at [phone] for assistance.     Non-urgent routine concerns, questions, and refill requests: please send me a mychart message and I will get back to you within 2 business days. If you prefer to call, please call the front desk at [phone].    Regarding appointments:  - If you need to cancel your appointment, we ask that you call [phone] at least 24 hours before your scheduled appointment.  - If for any reason you arrive 15 minutes later than your scheduled appointment time, you may not be seen and your visit may be rescheduled.  - Please remember that we will not automatically reschedule missed appointments.  - If you miss two (2) appointments without letting us  know in advance, you will likely be referred to a provider in your community.  - We will do our best to be on time. Sometimes an emergency will arise that might cause your clinician to be late. We will try to inform you of this when you check in for your appointment. If you wait more than 15 minutes past your appointment time without such notice, please speak with the front desk staff.    In the event of bad weather, the clinic staff will attempt to contact you, should your appointment need to be rescheduled. Additionally, you can call the Patient Weather Line [phone] for system-wide clinic status    For more information and reminders regarding clinic policies (these were provided when you were admitted to the clinic), please ask the front desk.

## 2023-11-19 NOTE — Unmapped (Signed)
 [facility]  Psychiatry   Established Patient E&M Service - Outpatient       Assessment:    Daiwik Buffalo presents for follow-up evaluation. Today, Mr. Whitner is reporting some difficulty w/ sleep in the past week. He is not having sleep latency, but waking up frequently to use the bathroom. Does usually wake up to use the bathroom, but this is happening more frequently and is leading to getting less sleep (he estimates about 4 hours nightly). He did not ID any reason for this change. He does report he doesn't do any fluid restriction before bed and drinks throughout the night when he wakes up as well. He did have 1 recent instance of irritability, but denies any other recent mania or hypomania sx. Energy is OK, but not feeling increased energy or euphoric. TD has not been a problem lately. He also shared today that he is feeling nauseous and dizzy (feels like vertigo). He was sweating profusely outside though notes that the temperature was below 80 degrees. Reports that he does tend to be sensitive to heat, but this is more than normal. He is going to his doctor after this visit to get evaluated. He denies any change in his medications recently or his hydration status. Has not been taking any NSAIDs recently. At this time, we discussed moving lithium  to bedtime in hopes of improving excessive thirst (which may be related to lithium  use). We also discussed getting lithium  labs. Recommended that he ask his doctor to get a lithium  level today to assess for lithium  toxicity. He denied feeling tremulous or having confusion today. We briefly discussed sleep hygiene today and potentially limiting fluid intake before bed and overnight. He does not think he has had dry mouth, but discussed that if he does have dry mouth we could consider options (xylimelts, biotene spray) to help w/ that while still limiting fluid before bedtime and overnight. We did also discuss that if he continues to have heat intolerance that bothers him we could consider reducing his effexor  dose. He agrees that current efffexor dose does seem to help w/ his mood though. Can also try taking trazodone , which he has taken in the past though he does not recall how it worked or any SE. We are planning to follow-up in 1 month. Asked pt to let me know what lithium  level is at visit today.    Identifying Information:  Kirill Chatterjee is a 68 y.o. male with a history of  Bipolar I Disorder that was diagnosed in 2003. He established with this clinic in September 2022. At that time, he was prescribed vraylar , lithium , doxepin , and xanax  with fairly good efficacy and tolerability.     Risk Assessment:  A full psychiatric risk assessment was conducted on 01/31/21 and risks do not appear significantly changed from that visit.   While future psychiatric events cannot be accurately predicted, the patient does not currently require acute inpatient psychiatric care and does not currently meet [facility]  involuntary commitment criteria.     Plan:    #Bipolar I Disorder:  --CHANGE Lithium  ER to 1350 mg at bedtime. Level of 0.7 in April 2025. Asked pt to get repeat level, advised about not taking NSAIDs w/ lithium .  --Continue Vraylar  1.5 mg daily for now. Consider dose increase (previously on 3 mg).   --Continue hydroxyzine  25 mg BID PRN.   --Continue Effexor  225 mg daily (s 4/11,i5/12, I5/22, i11/6/23, d8/9/24). Did previously have mania on 225 (though this was  in context of significant stressors). Has had prolonged period w/o mania at this dose in the past. Depression generally seems to be more well-controlled at this dose compared to 150 mg.   --Can try trazodone  25-50 mg nightly PRN   --Avoid fluids 1-2 hours before bedtime and overnight, monitor for dry mouth     #Medication monitoring:   --Recent metabolic labs w/ elevation in J8r (pt does have DM). Encouraged pt to follow-up with PCP +/- endocrinology.  --Given age will plan to repeat lithium  labs Q37months (aim for repeat in August 2025)  --Discussed need for in-person visit for repeat AIMS  --AIMS on 06/19/22 of 2 (scanned to media). Scored for movements in face, mouth, jaw, tongue. Had significant twitching in upper face, tongue movements (though not observed w/ opening but apparent throughout interview and with distraction), puckering. Patient also made frequent vocalizations which he reported no awareness of.    Lab Results   Component Value Date    Cholesterol, Total 122 07/06/2023    Cholesterol, LDL, Calculated 66 07/06/2023    Cholesterol, HDL 26 (L) 07/06/2023    Triglycerides 150 07/06/2023     Hemoglobin A1C:   Lab Results   Component Value Date    Hemoglobin A1C 8.4 (H) 07/06/2023     Lithium  Lvl   Date Value Ref Range Status   08/28/2023 0.7 (L) 1.0 - 1.2 mmol/L Final     TSH   Date Value Ref Range Status   07/06/2023 3.642 0.550 - 4.780 uIU/mL Final     Creatinine Whole Blood, POC   Date Value Ref Range Status   10/01/2019 0.9 0.8 - 1.4 mg/dL Final   98/70/7983 0.9 0.8 - 1.4 mg/dL Final     Creatinine   Date Value Ref Range Status   07/06/2023 0.84 0.73 - 1.18 mg/dL Final   93/75/7978 9.11 0.76 - 1.27 mg/dL Final       Psychotherapy provided:  No billable psychotherapy service provided.    Patient has been given this writer's contact information as well as the [facility] Psychiatry urgent line number. The patient has been instructed to call 911 for emergencies.    Subjective:    Interval History: Pt reports that he is not doing well today. He has been throwing up and has had some vertigo. He has felt unbalanced. Planning to go to the doctor today to get checked out. He reports I started sweating and was overheating. States that he sweating heavily, but it was under 80 degrees.    He reports that he hasn't been sleeping well lately. States that he is getting a few hours at the start of the night and then is waking up every hour. He reports that has been going on for about a week. Thinks he is getting 4 hours of sleep recently. States that he is waking up to go to the bathroom frequently. He states that he is going more than normal. He has been taking flomax, but it doesn't seem to be helping right now. He isn't taking too long to get back to sleep when he wakes up. He doesn't take long to fall asleep.     He is not aware of any changes that would contribute to waking up more often.     Energy is OK not great.     Mood has been pretty good. States that his husband thought I was a little manic, I was irritable the other night, but it didn't last. That happened 2 nights  ago. In general, he is not feeling irritable. Denies any other concerns for mania sx. Denies feeling euphoric. He doesn't feel he is manic. Denies any change in his behavior including impulsivity.    Reviewed medication list -- also taking melatonin 10 mg nightly. He is not missing doses. Denies any SE (aside from worsened heat intolerance). No nausea, tremor. Denies any confusion.    He is staying hydrated.     He has had some recent diarrhea.     He very rarely takes an ibuprofen PM to help w/ sleep (1x per month). Hasn't done that very recently.    He denies any other concerns today.     He reports that his TD is getting better. He thinks he is doing less grunting.    He reports that he tends to get up in the night and have water when he goes to the bathroom.     He tends to be really thirsty. Has felt that way for at least a year. He is not sure whether he has dry mouth (he thinks not).    He is not sure if he has taken lithium  all at once before.    He is not sure how he did with Trazodone .    Discussed risks of lithium  toxicity. Planning to see doctor for eval today.    Discussed POC (see assessment above for details). No other Q's or concerns today.    Objective:    Mental Status Exam:  Appearance:    Appears stated age, Well nourished, Well developed and Clean/Neat   Motor:   No abnormal movements noted on interview.   Speech/Language: Normal rate, volume, tone, fluency and Language intact, well formed. No grunting or other sounds on interview today   Mood:   OK   Affect:   Generally euthymic, tired appearing today.   Thought process and Associations:   Logical, linear, clear, coherent, goal directed   Abnormal/psychotic thought content:    Doing well, not reporting any SI. Denies hypomania/mania sx. Remains future oriented. Not voicing any bizarre or delusional thought content on interview.   Perceptual disturbances:      He is not endorsing any AVH. No RIS on interview.     Other:          I personally spent 41 minutes face-to-face and non-face-to-face in the care of this patient, which includes all pre, intra, and post visit time on the date of service.  All documented time was specific to the E/M visit and does not include any procedures that may have been performed.          The patient reports they are physically located in [place]  and is currently: at home. I conducted a audio/video visit. I spent  37m 18s on the video call with the patient. I spent an additional 15 minutes on pre- and post-visit activities on the date of service .       Othel KATHEE Blumenthal, MD

## 2023-11-22 ENCOUNTER — Ambulatory Visit: Admit: 2023-11-22 | Discharge: 2023-11-23 | Payer: Medicare (Managed Care)

## 2023-11-22 ENCOUNTER — Encounter
Admit: 2023-11-22 | Discharge: 2023-11-22 | Payer: Medicare (Managed Care) | Attending: Student in an Organized Health Care Education/Training Program | Primary: Student in an Organized Health Care Education/Training Program

## 2023-11-22 DIAGNOSIS — Z79899 Other long term (current) drug therapy: Principal | ICD-10-CM

## 2023-11-22 LAB — BASIC METABOLIC PANEL
ANION GAP: 8 mmol/L (ref 3–11)
BLOOD UREA NITROGEN: 12 mg/dL (ref 8–20)
BUN / CREAT RATIO: 15
CALCIUM: 8.8 mg/dL (ref 8.5–10.1)
CHLORIDE: 106 mmol/L (ref 98–107)
CO2: 25.4 mmol/L (ref 21.0–32.0)
CREATININE: 0.78 mg/dL — ABNORMAL LOW (ref 0.80–1.30)
EGFR CKD-EPI (2021) MALE: >90 mL/min/1.73m2 (ref >=60)
GLUCOSE RANDOM: 140 mg/dL (ref 70–179)
POTASSIUM: 4.2 mmol/L (ref 3.5–5.0)
SODIUM: 139 mmol/L (ref 135–145)

## 2023-11-22 LAB — LITHIUM LEVEL: LITHIUM LEVEL: 0.9 mmol/L (ref 0.5–1.2)

## 2023-11-22 LAB — TSH: THYROID STIMULATING HORMONE: 2.619 u[IU]/mL (ref 0.550–4.780)

## 2023-11-23 MED ORDER — TRAZODONE 50 MG TABLET
ORAL_TABLET | Freq: Every evening | ORAL | 0 refills | 60.00000 days | Status: CP | PRN
Start: 2023-11-23 — End: 2024-01-22

## 2023-11-27 NOTE — Unmapped (Signed)
 The [facility] has made a second and final attempt to reach this patient to refill the following medication:tenofovir  disoproxil 300 mg tablet (VIREAD ).      We have left voicemails on the following phone numbers: [phone] and [phone], have sent a MyChart message, have sent a text message to the following phone numbers: [phone], and have sent a Mychart questionnaire..    Dates contacted: 07/10,07/16,07/18, and 07/22  Last scheduled delivery: 10/23/2023    The patient may be at risk of non-compliance with this medication. The patient should call the [facility] at [phone]  Option 4, then Option 4: Infectious Disease, Transplant to refill medication.    Lucie HERO Lincoln Kleiner   [facility] Oncologist

## 2023-11-30 DIAGNOSIS — B181 Chronic viral hepatitis B without delta-agent: Principal | ICD-10-CM

## 2023-12-03 NOTE — Unmapped (Signed)
 [facility] - Enhanced Care Program  Reason for Call: 3rd Attempt MAP; Type: SSC Refill Call  Referral Request: Hepatology - Slater Blunt, CPP    Summary of Telephone Encounter  The [facility] pharmacy was trying to contact this patient to refill tenofovir . Last scheduled delivery was 10/23/23. Previous copays have been $28.15, this copay looks to be $516.57   Called patient at [phone] and left VM telling patient to contact [facility] pharmacy to schedule refill at [phone]  Option 4, then Option 4. Also provided Debbie's call back number.   Also sent MyChart message.      Follow-Up:  Continue trying to contact patient to schedule delivery of tenofovir   Notify Slater Blunt, CPP when refill has been scheduled    Call Attempt #: 1  Time Spent on Referral: 5 minutes  Number of Days Spent on Referral: 1    Estefana Na, PharmD Candidate  Ambulatory Care Intern

## 2023-12-04 NOTE — Unmapped (Signed)
 [facility] - Enhanced Care Program  Reason for Call: 3rd Attempt MAP; Type: SSC Refill Call  Referral Request: Hepatology - Slater Blunt, CPP    Summary of Telephone Encounter  The [facility] pharmacy was trying to contact this patient to refill tenofovir . Last scheduled delivery was 10/23/23. Previous copays have been $28.15, this copay looks to be $516.57   I tried contacting pt at both [phone] & [phone] but both mailboxes are full and unable to leave VM.   We left a MyChart message on 12/03/2023.   Follow-Up:  Continue trying to contact patient to assist with refill coordination of tenofovir .  Notify Slater Blunt, CPP when refill has been scheduled    Call Attempt #: 2  Time Spent on Referral: 10 minutes  Number of Days Spent on Referral: 2    Marval Hussar RN, BSN  Nursing [facility]   Pharmacy Department  [facility]  [address]   [place], [place] [address]  [phone]

## 2023-12-05 NOTE — Unmapped (Signed)
 Opened in error

## 2023-12-10 NOTE — Unmapped (Signed)
 [facility] - Enhanced Care Program  Reason for Call: 3rd Attempt MAP; Type: SSC Refill Call  Referral Request: Hepatology - Slater Blunt, CPP     Summary of Telephone Encounter  The [facility] pharmacy was trying to contact this patient to refill tenofovir . Last scheduled delivery was 10/23/23. Previous copays have been $28.15, this copay looks to be $516.57   Patient responded to MyChart message on 7/29 saying that he cannot fill the medication until 12/09/23 when he next gets paid.  We reached out to Elveria Gaskins, PharmD after reviewing the MyChart messages back and forth with [facility] and patient. It was noted that patient delayed shipment due to the $77.44 copay. We asked Elveria if that is the copay amount and she said it was $28.15 if he gets 1 month and the $77.44 was for a 3 month supply. We expressed our concern that the patient may have delayed due to the $77.44 and the patient may be out of medication because the two options were not given to the patient. Elveria will reach out to patient to explain this. If we find out patient was out of medication, we will write a safety report. Slater will be notified.  Per note today from Aleck Gaskins, she spoke with patient and medication is scheduled to be shipped on 8/07.  He has 5 tablets left on hand so did not run out of medication.  Follow-Up:  Follow up on 8/07 to make sure payment went through and notify Slater Blunt, CPP.     Call Attempt #: 4  Time Spent on Referral: 30 minutes  Number of Days Spent on Referral: 4    Leita Marc, Pharm.D.  Pharmacy Department  [facility]  [address]  [place], [place] [address]   [phone]

## 2023-12-10 NOTE — Unmapped (Signed)
 [facility] Refill Coordination Note    Nicholas [name], [place]: [birth year]  Phone: [phone] (home) [phone] (work)      All above HIPAA information was verified with patient.         12/07/2023     4:53 PM   Specialty Rx Medication Refill Questionnaire   Which Medications would you like refilled and shipped? Tenofovir    Please list all current allergies: on file   Have you missed any doses in the last 30 days? No   Have you had any changes to your medication(s) since your last refill? No   How much of each medication do you have remaining at home? (eg. number of tablets, injections, etc.) 5 tablets   Have you experienced any side effects in the last 30 days? No   Please enter the full address (street address, city, state, zip code) where you would like your medication(s) to be delivered to. [address] [place], [place] [address]   Please specify on which day you would like your medication(s) to arrive. Note: if you need your medication(s) within 3 days, please call the pharmacy to schedule your order at [phone]  12/13/2023   Has your insurance changed since your last refill? No   Would you like a pharmacist to call you to discuss your medication(s)? No   Do you require a signature for your package? (Note: if we are billing Medicare Part B or your order contains a controlled substance, we will require a signature) No   I have been provided my out of pocket cost for my medication and approve the pharmacy to charge the amount to my credit card on file. Yes         Completed refill call assessment today to schedule patient's medication shipment from the [facility] [phone]).  All relevant notes have been reviewed.       Confirmed patient received a Conservation officer, historic buildings and a Surveyor, mining with first shipment. The patient will receive a drug information handout for each medication shipped and additional FDA Medication Guides as required. REFERRAL TO PHARMACIST     Referral to the pharmacist: Not needed      [facility]     Shipping address confirmed in Epic.     Delivery Scheduled: Yes, Expected medication delivery date: 12/13/23.     Medication will be delivered via UPS to the prescription address in Epic WAM.    Nicholas [name], PharmD   [facility] Specialty Pharmacist

## 2023-12-13 DIAGNOSIS — F3130 Bipolar disorder, current episode depressed, mild or moderate severity, unspecified: Principal | ICD-10-CM

## 2023-12-13 DIAGNOSIS — Z79899 Other long term (current) drug therapy: Principal | ICD-10-CM

## 2023-12-13 DIAGNOSIS — G2401 Drug induced subacute dyskinesia: Principal | ICD-10-CM

## 2023-12-13 MED ORDER — TRAZODONE 50 MG TABLET
ORAL_TABLET | Freq: Every evening | ORAL | 0 refills | 60.00000 days | Status: CP | PRN
Start: 2023-12-13 — End: 2024-02-11

## 2023-12-13 NOTE — Unmapped (Signed)
 [facility]  Psychiatry   Established Patient E&M Service - Outpatient       Assessment:    Nicholas [name] presents for follow-up evaluation. Today, Nicholas [name] is fairly stable. Mood is fair. No concerns for mania or hypomania sx. He is waking up less frequently overnight. Has tried to limit fluid intake some. He is taking trazodone , which is helping him get more sleep (8-9 hours total nightly). No SE reported. Has moved to taking lithium  all at night. This has gone OK though there has been no change in his excessive thirst. He doesn't feel he has had dry mouth. 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 He reports TD is ongoing (primarily grunting noises), but has not been worse lately. Not noticing any abnormal movements. At this time, we discussed continuing medications as currently prescribed. I have low suspicion for diabetes insipidus 2/2 lithium  use particularly given patient has medical comorbidities (DM) that could lead to excess thirst and more frequent urination. Will continue to monitor. Planning for follow-up in ~2 months.     Identifying Information:  Nicholas [name] is a 68 y.o. male with a history of  Bipolar I Disorder that was diagnosed in 2003. He established with this clinic in September 2022. At that time, he was prescribed vraylar , lithium , doxepin , and xanax  with fairly good efficacy and tolerability.     Risk Assessment:  A full psychiatric risk assessment was conducted on 01/31/21 and risks do not appear significantly changed from that visit.   While future psychiatric events cannot be accurately predicted, the patient does not currently require acute inpatient psychiatric care and does not currently meet [place]  involuntary commitment criteria.     Plan:    #Bipolar I Disorder:  --Continue Lithium  ER to 1350 mg at bedtime. Level of 0.9 in July 2025.  --Continue Vraylar  1.5 mg daily for now. Consider dose increase (previously on 3 mg). --Continue Effexor  225 mg daily (s 4/11,i5/12, I5/22, i11/6/23, d8/9/24). Did previously have mania on 225 (though this was in context of significant stressors). Has had prolonged period w/o mania at this dose in the past. Depression generally seems to be more well-controlled at this dose compared to 150 mg.   --Continue trazodone  50 mg nightly PRN   --Avoid fluids 1-2 hours before bedtime and overnight     #Medication monitoring:   --Prior metabolic labs w/ elevation in J8r (pt does have DM). Encouraged pt to follow-up with PCP +/- endocrinology.  --Given age will plan to repeat lithium  labs Q12months (Next check in January 2025, will repeat metabolic labs at that time if not completed prior with another provider)  --Discussed need for in-person visit for repeat AIMS  --AIMS on 06/19/22 of 2 (scanned to media). Scored for movements in face, mouth, jaw, tongue. Had significant twitching in upper face, tongue movements (though not observed w/ opening but apparent throughout interview and with distraction), puckering. Patient also made frequent vocalizations which he reported no awareness of.    Lab Results   Component Value Date    Cholesterol, Total 122 07/06/2023    Cholesterol, LDL, Calculated 66 07/06/2023    Cholesterol, HDL 26 (L) 07/06/2023    Triglycerides 150 07/06/2023     Hemoglobin A1C:   Lab Results   Component Value Date    Hemoglobin A1C 8.4 (H) 07/06/2023     Lithium  Lvl   Date Value Ref Range Status   11/22/2023 0.9 0.5 - 1.2 mmol/L  Final     TSH   Date Value Ref Range Status   11/22/2023 2.619 0.550 - 4.780 uIU/mL Final     Creatinine Whole Blood, POC   Date Value Ref Range Status   10/01/2019 0.9 0.8 - 1.4 mg/dL Final   98/70/7983 0.9 0.8 - 1.4 mg/dL Final     Creatinine   Date Value Ref Range Status   11/22/2023 0.78 (L) 0.80 - 1.30 mg/dL Final   93/75/7978 9.11 0.76 - 1.27 mg/dL Final       Psychotherapy provided:  No billable psychotherapy service provided.    Patient has been given this writer's contact information as well as the [facility] urgent line number. The patient has been instructed to call 911 for emergencies.    Subjective:    Interval History:Pt reports that he is feeling pretty good. States that trazodone  seems to be working. He is able to sleep longer without interruption (2 hours). Getting 8-9 hours total. He isn't having SE w/ trazodone  including AM sedation. He is feeling like he is resting adequately.    Mood has been pretty good. He is playing w/ his dogs. He has been watching TV. He is enjoying things lately.     Energy is OK.     No particular concerns or bothersome sx today.     He reports that the symptoms he was having on the day of our last visit were felt to be related to dehydration. Those sx resolved in 24 hours.     He did move his lithium  all to bedtime. That is going OK. He hasn't noticed any change in his thirst. He doesn't feel like he has dry mouth.     He did drink a little less fluid for a while, but then got constipated.     He has been trying to drink a little less in the evenings and overnight.     He does have some ongoing grunting noises. This will happen a few times per day. Camellia notices this as well. He hasn't noticed the mouth movements and neither has his husband.    Reviewed medication list -- No recent SE except as above. Denies recent missed med doses.    Discussed POC (see assessment above for details). No other q's or concerns today.        Objective:    Mental Status Exam:  Appearance:    Appears stated age, Well nourished, Well developed and Clean/Neat   Motor:   No abnormal movements noted on interview.   Speech/Language:    Normal rate, volume, tone, fluency and Language intact, well formed. No grunting or other sounds on interview today   Mood:   OK   Affect:   Generally euthymic   Thought process and Associations:   Logical, linear, clear, coherent, goal directed   Abnormal/psychotic thought content:    Doing well, not reporting any SI. Denies hypomania/mania sx. Remains future oriented. Not voicing any bizarre or delusional thought content on interview.   Perceptual disturbances:      He is not endorsing any AVH. No RIS on interview.     Other:          I personally spent 27 minutes face-to-face and non-face-to-face in the care of this patient, which includes all pre, intra, and post visit time on the date of service.  All documented time was specific to the E/M visit and does not include any procedures that may have been performed.  The patient reports they are physically located in [place]  and is currently: at home. I conducted a audio/video visit. I spent  1m 42s on the video call with the patient. I spent an additional 10 minutes on pre- and post-visit activities on the date of service .     Othel KATHEE Blumenthal, MD

## 2023-12-13 NOTE — Unmapped (Signed)
 Follow-up instructions:  --Continue trying to limit or discontinue fluid intake ~2 hours before bed. I would recommend drinking only small sips of water if waking up overnight.  --You could discuss your excessive thirst with your primary care doctor or endocrinology. Sometimes this symptom is related to lithium  use, but it can also be related to diabetes or to other medications.   -- Please continue taking your medications as prescribed for your mental health.   -- Do not make changes to your medications, including taking more or less than prescribed, unless under the supervision of your physician. Be aware that some medications may make you feel worse if abruptly stopped  -- Please refrain from using illicit substances, as these can affect your mood and could cause anxiety or other concerning symptoms.   -- Seek further medical care for any increase in symptoms or new symptoms such as thoughts of wanting to hurt yourself or hurt others.     Contact info:  Life-threatening emergencies: call 911 or go to the nearest ER for medical or psychiatric attention.     Issues that need urgent attention but are not life threatening: call the clinic outpatient frontdesk at [phone] for assistance.     Non-urgent routine concerns, questions, and refill requests: please send me a mychart message and I will get back to you within 2 business days. If you prefer to call, please call the front desk at [phone].    Regarding appointments:  - If you need to cancel your appointment, we ask that you call [phone] at least 24 hours before your scheduled appointment.  - If for any reason you arrive 15 minutes later than your scheduled appointment time, you may not be seen and your visit may be rescheduled.  - Please remember that we will not automatically reschedule missed appointments.  - If you miss two (2) appointments without letting us  know in advance, you will likely be referred to a provider in your community.  - We will do our best to be on time. Sometimes an emergency will arise that might cause your clinician to be late. We will try to inform you of this when you check in for your appointment. If you wait more than 15 minutes past your appointment time without such notice, please speak with the front desk staff.    In the event of bad weather, the clinic staff will attempt to contact you, should your appointment need to be rescheduled. Additionally, you can call the Patient Weather Line [phone] for system-wide clinic status    For more information and reminders regarding clinic policies (these were provided when you were admitted to the clinic), please ask the front desk.

## 2024-01-03 ENCOUNTER — Encounter: Admit: 2024-01-03 | Discharge: 2024-01-04 | Payer: Medicare (Managed Care)

## 2024-01-03 MED ORDER — TRAZODONE 50 MG TABLET
ORAL_TABLET | Freq: Every evening | ORAL | 0 refills | 60.00000 days | Status: CP | PRN
Start: 2024-01-03 — End: 2024-03-03

## 2024-01-03 NOTE — Unmapped (Signed)
 [facility], [facility], [place]  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  He denies any complications including variceal bleed, ascites or hepatic encephalopathy. He has not been evaluated for transplant secondary low meld score (6). Since his last clinic visit he denies any complications or hospitalizations.  He remains on viread  daily.   His most recent MRI was on 01/10/2021 and showed LR-3 nodule in the right hepatic dome which appears similar to prior.  Additional LR-2 perfusional changes are similar to prior.  He  underwent a bone density on 10/31/2018 which was normal.  He was diagnosed with chronic hepatitis B approximately 20years ago.  Patient has never undergone a liver biopsy.    r.  He was initially seen in Hepatology clinic on 06/02/2013. He does not use any alcohol products at this time and only uses occasional alcohol in his 20s or 30s. Has well controlled bi-polar.        Today  he overall feels well and denies any fever, chills, headache, jaundice, chest pain, upper lower GI bleeding, melena or confusion.  Has a historic diagnosis of pancreatic insuffiencey and is on high dose enzymes. However continues with urgent stool, diarrhea and fecal incontinence at times. Is taking imodium 4-5X daily and this seems to help.   Last colon 2021    CIRRHOSIS CARE:  1. EGD: 01/2020. OSH On nadolol .  2. Imaging: HCC screening over due, MRI ordered  3. Vaccination for hepatitis A and B: Immune to A.  4. SBP prophylaxis [h/o SBP, or CP >= 9 w/ bili >= 3, or cr >= 1.2, BUN >= 25, or Na <= 130]:   5. Bone health: 10/31/2018  6. Nutrition: wt & exercise mgmnt, low Na intake and bedtime snack discussed.  7. OTC agents: proper use of acetaminophen and avoidance of NSAIA's & HDS products discussed.  8. Transplant: Not in evaluation secondary low meld score/clinical stability.        PMH:  1.  Chronic hepatitis B with cirrhosis  2.  Bipolar disorder.  3.  Depression  4 chronic diarrhea pancreatic insufficiency     PSH:  1.  Appendectomy    MEDICATIONS:     ALLERGIES:  1.  Lamictal-rash  2.  Lexapro  3.  Abilify  4. Oxycodone-itching      FH: No known liver disease or cancer    SH: He is single and arrives today accompanied by his partner.  He quit tobacco several years ago and does not use any alcohol products.    Imaging  MRi 11/12/2022  1. Cirrhosis.   2. Unchanged small hyperenhancing focus of the central liver dome,   hepatic segment VIII, measuring 0.7 cm, without evidence of washout   or capsular enhancement, consistent with a LI-RADS category 3   lesion, intermediate suspicion for hepatocellular carcinoma. No new   liver lesions. Continued attention on follow-up at 6 months.   3. Splenomegaly and small gastroesophageal varices. No ascites.         Electronically Signed  LABS:   Lab Results   Component Value Date    WBC 4.6 07/06/2023    HGB 13.0 07/06/2023    HCT 39.1 07/06/2023    PLT 75 (L) 07/06/2023       Lab Results   Component Value Date    NA 139 11/22/2023    K 4.2 11/22/2023    CL 106 11/22/2023    CO2 25.4 11/22/2023    BUN 12 11/22/2023    CREATININE 0.78 (L) 11/22/2023    GLU 140 11/22/2023    CALCIUM 8.8 11/22/2023    PHOS 4.0 04/23/2018       Lab Results   Component Value Date    BILITOT 0.3 07/06/2023    BILIDIR 0.20 10/24/2022    PROT 5.8 07/06/2023    ALBUMIN 3.2 (L) 07/06/2023    ALT 77 (H) 07/06/2023    AST 45 (H) 07/06/2023    ALKPHOS 91 07/06/2023    GGT 42 12/04/2018       Lab Results   Component Value Date    LABPROT 11.2 10/30/2019    INR 1.13 07/06/2023     MELD 3.0: 8 at 07/06/2023 12:21 PM  MELD-Na: 7 at 07/06/2023 12:21 PM  Calculated from:  Serum Creatinine: 0.84 mg/dL (Using min of 1 mg/dL) at 7/71/7974 87:78 PM  Serum Sodium: 143 mmol/L (Using max of 137 mmol/L) at 07/06/2023 12:21 PM  Total Bilirubin: 0.3 mg/dL (Using min of 1 mg/dL) at 7/71/7974 87:78 PM  Serum Albumin: 3.2 g/dL at 7/71/7974 87:78 PM  INR(ratio): 1.13 at 07/06/2023 12:21 PM  Age at listing (hypothetical): 38 years  Sex: Male at 07/06/2023 12:21 PM      CP score:6  Childs Class: A    ASSESSMENT:Mr. 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  Today's visit is being done via video..  .  He denies any complications including variceal bleed, ascites or hepatic encephalopathy. He has not been evaluated for transplant secondary low meld score (6). Since his last clinic visit he denies any complications or hospitalizations.  He remains on Viread  300 mg daily, Vemlidy  too expensive.   Cirrhosis care is up to date.  Today  he overall feels well and denies any fever, chills, headache, jaundice, chest pain, upper lower GI bleeding, melena or confusion.    In a thorough chart review I cannot find a 24hour fecal fat study.     Recommend he seek local GI care      PLAN:  1.  I have discussed his care with Dr. Aureliano Fairly  2.  Check laboratory studies   3.  Continue Viread .   4.  MRI at [facility]  5.  Needs local GI care for chronic diarrhea  6.  Plan to see Mr. Lunn back in the liver clinic in 6 months.        Levan Baron, NP-C  [facility]  [address]  [place], [place]  [address]  Ph: [phone]  Fax: [phone]        The patient reports they are physically located in [place]  and is currently: at home. I conducted a phone visit.  I spent 30 minutes on the phone call with the patient on the date of service .

## 2024-01-14 NOTE — Unmapped (Signed)
 This is [facility] Department of Psychiatry calling to schedule your follow up appointment. Please give [place] a call back at phone number [phone] option 3, when you are ready to schedule. Thank you and have a great day.

## 2024-01-16 NOTE — Unmapped (Signed)
 Due to a recent change in your provider's schedule, we are reaching out to reschedule your appointment. Please reach out when you have availability to reschedule at phone number [phone] option 3. Thank you and have a great day.    Your appointment with Vira, Othel Cleaves, MD) on (02/21/2024) has to be rescheduled.  Vira, Othel Cleaves, MD) is unavailable.    Please give us  a call back at [phone] option 3, to reschedule your appointment.

## 2024-01-16 NOTE — Unmapped (Signed)
 I am reaching out from the [facility] Department of Psychiatry to confirm your appointment for Thursday, October 16th at 11:30. If you have any questions/ concerns regarding your appointment, please give us  a call at [phone]. Thank you and have a great day.

## 2024-01-17 MED ORDER — ALPRAZOLAM 0.5 MG TABLET
ORAL_TABLET | ORAL | 0 refills | 0.00000 days | Status: CP
Start: 2024-01-17 — End: ?

## 2024-01-17 MED ORDER — TRAZODONE 50 MG TABLET
ORAL_TABLET | Freq: Every evening | ORAL | 0 refills | 30.00000 days | Status: CP | PRN
Start: 2024-01-17 — End: 2024-03-17

## 2024-01-17 NOTE — Unmapped (Signed)
 The [facility] has made a third and final attempt to reach this patient to refill the following medication:Viread .      We have left voicemails on the following phone numbers: [phone], have been unable to leave messages on the following phone numbers: [phone]  [phone], have sent a MyChart message, have sent a text message to the following phone numbers: [phone]  [phone], and have sent a Mychart questionnaire..    Dates contacted: 01/14/2024  01/15/2024   01/17/2024  Last scheduled delivery: 12/13/2023    The patient may be at risk of non-compliance with this medication. The patient should call the [facility] at [phone]  Option 4, then Option 4: Infectious Disease, Transplant to refill medication.    Jeoffrey JAYSON Sherra UNK Karel armin Corean Jeppie Venson Karel Nathanael

## 2024-01-17 NOTE — Unmapped (Signed)
 [facility] Specialty and Home Delivery Pharmacy Refill Coordination Note    Nicholas [name], [place]: 03/13/1956  Phone: [phone] (home) [phone] (work)      All above HIPAA information was verified with patient.         01/14/2024    12:34 PM   Specialty Rx Medication Refill Questionnaire   Which Medications would you like refilled and shipped? Tenofovir    Please list all current allergies: On file   Have you missed any doses in the last 30 days? No   Have you had any changes to your medication(s) since your last refill? No   How much of each medication do you have remaining at home? (eg. number of tablets, injections, etc.) 10   Have you experienced any side effects in the last 30 days? No   Please enter the full address (street address, city, state, zip code) where you would like your medication(s) to be delivered to. [address] [place], [place] [address]   Please specify on which day you would like your medication(s) to arrive. Note: if you need your medication(s) within 3 days, please call the pharmacy to schedule your order at [phone]  01/23/2024   Has your insurance changed since your last refill? No   Would you like a pharmacist to call you to discuss your medication(s)? No   Do you require a signature for your package? (Note: if we are billing Medicare Part B or your order contains a controlled substance, we will require a signature) No   I have been provided my out of pocket cost for my medication and approve the pharmacy to charge the amount to my credit card on file. Yes         Completed refill call assessment today to schedule patient's medication shipment from the [facility] and [facility] Delivery Pharmacy [phone]).  All relevant notes have been reviewed.       Confirmed patient received a Conservation officer, historic buildings and a Surveyor, mining with first shipment. The patient will receive a drug information handout for each medication shipped and additional FDA Medication Guides as required. REFERRAL TO PHARMACIST     Referral to the pharmacist: Not needed      [facility]     Shipping address confirmed in Epic.     Delivery Scheduled: Yes, Expected medication delivery date: 09/17.     Medication will be delivered via UPS to the prescription address in Epic WAM.    Erica Richwine   [place] [facility] Specialty Technician

## 2024-01-22 MED FILL — TENOFOVIR DISOPROXIL FUMARATE 300 MG TABLET: ORAL | 30 days supply | Qty: 30 | Fill #1

## 2024-02-18 NOTE — Unmapped (Signed)
.  02/18/2024 - Patient originally requested delivery for 10/17. Delivery is not possible on this date due to Pharmacy will be  closed and UPS delivery not available on 10/17. I have reached out to the patient and confirmed that delivery on 10/16 is ok.      [facility] Special[facility] and Home Delivery Pharmacy Refill Coordination Note    Tanya Marvin, [place]: January 10, 1956  Phone: [phone] (home) [phone] (work)      All above HIPAA information was verified with patient.         02/15/2024     6:21 PM   Specialty Rx Medication Refill Questionnaire   Which Medications would you like refilled and shipped? Tenofovir    Please list all current allergies: On file   Have you missed any doses in the last 30 days? No   Have you had any changes to your medication(s) since your last refill? No   How much of each medication do you have remaining at home? (eg. number of tablets, injections, etc.) 10   Have you experienced any side effects in the last 30 days? No   Please enter the full address (street address, city, state, zip code) where you would like your medication(s) to be delivered to. [address], [place], [place] [address]   Please specify on which day you would like your medication(s) to arrive. Note: if you need your medication(s) within 3 days, please call the pharmacy to schedule your order at [phone]  02/22/2024   Has your insurance changed since your last refill? No   Would you like a pharmacist to call you to discuss your medication(s)? No   Do you require a signature for your package? (Note: if we are billing Medicare Part B or your order contains a controlled substance, we will require a signature) No   I have been provided my out of pocket cost for my medication and approve the pharmacy to charge the amount to my credit card on file. Yes         Completed refill call assessment today to schedule patient's medication shipment from the [facility] and Home Delivery Pharmacy [phone]).  All relevant notes have been reviewed.       Confirmed patient received a Conservation officer, historic buildings and a Surveyor, mining with first shipment. The patient will receive a drug information handout for each medication shipped and additional FDA Medication Guides as required.         REFERRAL TO PHARMACIST     Referral to the pharmacist: Not needed      [facility]     Shipping address confirmed in Epic.     Delivery Scheduled: Yes, Expected medication delivery date: 10/16.     Medication will be delivered via UPS to the prescription address in Epic WAM.    Jeoffrey JAYSON Sherra UNK Specialty and [facility]

## 2024-02-20 MED FILL — TENOFOVIR DISOPROXIL FUMARATE 300 MG TABLET: ORAL | 30 days supply | Qty: 30 | Fill #2

## 2024-03-05 MED ORDER — TRAZODONE 50 MG TABLET
ORAL_TABLET | 0 refills | 0.00000 days
Start: 2024-03-05 — End: ?

## 2024-03-05 MED ORDER — LITHIUM CARBONATE ER 450 MG TABLET,EXTENDED RELEASE
ORAL_TABLET | Freq: Every evening | ORAL | 0 refills | 90.00000 days | Status: CP
Start: 2024-03-05 — End: 2024-06-03

## 2024-03-06 MED ORDER — TRAZODONE 50 MG TABLET
ORAL_TABLET | ORAL | 0 refills | 0.00000 days | Status: CP
Start: 2024-03-06 — End: ?

## 2024-03-10 DIAGNOSIS — K7469 Other cirrhosis of liver: Principal | ICD-10-CM

## 2024-03-10 DIAGNOSIS — B181 Chronic viral hepatitis B without delta-agent: Principal | ICD-10-CM

## 2024-03-10 DIAGNOSIS — K766 Portal hypertension: Principal | ICD-10-CM

## 2024-03-10 DIAGNOSIS — I8510 Secondary esophageal varices without bleeding: Principal | ICD-10-CM

## 2024-03-10 MED ORDER — NADOLOL 20 MG TABLET
ORAL_TABLET | Freq: Every day | ORAL | 1 refills | 90.00000 days | Status: CP
Start: 2024-03-10 — End: 2024-09-06

## 2024-03-10 NOTE — Telephone Encounter (Signed)
 Refill request for Nadolol     Last clinic visit 01/03/24    Labs done 11/22/23    Will authorize refill at this time

## 2024-03-12 NOTE — Progress Notes (Signed)
 [facility]  Psychiatry   Established Patient E&M Service - Outpatient       Assessment:    Nicholas [name] presents for follow-up evaluation. Today, Nicholas [name] is stable psychiatrically. No major concerns about mood. He is taking medications regularly and tolerating them well. He reports subjective improvement in TD and no involuntary movements or grunting observed during visit today. Pt's spouse doesn't think movements are better, but hasn't noticed any worsening either. Given ongoing stability we are planning to continue medications as currently prescribed. Opting for more frequent lithium  monitoring given age (q6 months) so will plan to order labs to be obtained in January. Planning to follow-up in ~3 months.     Identifying Information:  Nicholas [name] is a 68 y.o. male with a history of  Bipolar I Disorder that was diagnosed in 2003. He established with this clinic in September 2022. At that time, he was prescribed vraylar , lithium , doxepin , and xanax  with fairly good efficacy and tolerability.     Risk Assessment:  A full psychiatric risk assessment was conducted on 01/31/21 and risks do not appear significantly changed from that visit.   While future psychiatric events cannot be accurately predicted, the patient does not currently require acute inpatient psychiatric care and does not currently meet [place]  involuntary commitment criteria.     Plan:    #Bipolar I Disorder:  --Continue Lithium  ER to 1350 mg at bedtime. Level of 0.9 in July 2025.  --Continue Vraylar  1.5 mg daily for now. Consider dose increase (previously on 3 mg).   --Continue Effexor  225 mg daily (s 4/11,i5/12, I5/22, i11/6/23, d8/9/24). Did previously have mania on 225 (though this was in context of significant stressors). Has had prolonged period w/o mania at this dose in the past. Depression generally seems to be more well-controlled at this dose compared to 150 mg.   --Continue trazodone  50 mg nightly PRN   --Avoid fluids 1-2 hours before bedtime and overnight     #Medication monitoring:   --Prior metabolic labs w/ elevation in J8r (pt does have DM). Encouraged pt to follow-up with PCP +/- endocrinology.  --Given age will plan to repeat lithium  labs Q14months (Next check in January 2025, will repeat metabolic labs at that time if not completed prior with another provider)  --Discussed need for in-person visit for repeat AIMS  --AIMS on 06/19/22 of 2 (scanned to media). Scored for movements in face, mouth, jaw, tongue. Had significant twitching in upper face, tongue movements (though not observed w/ opening but apparent throughout interview and with distraction), puckering. Patient also made frequent vocalizations which he reported no awareness of.    Lab Results   Component Value Date    Cholesterol, Total 122 07/06/2023    Cholesterol, LDL, Calculated 66 07/06/2023    Cholesterol, HDL 26 (L) 07/06/2023    Triglycerides 150 07/06/2023     Hemoglobin A1C:   Lab Results   Component Value Date    Hemoglobin A1C 8.4 (H) 07/06/2023     Lithium  Lvl   Date Value Ref Range Status   11/22/2023 0.9 0.5 - 1.2 mmol/L Final     TSH   Date Value Ref Range Status   11/22/2023 2.619 0.550 - 4.780 uIU/mL Final     Creatinine Whole Blood, POC   Date Value Ref Range Status   10/01/2019 0.9 0.8 - 1.4 mg/dL Final   98/70/7983 0.9 0.8 - 1.4 mg/dL Final     Creatinine   Date Value Ref  Range Status   11/22/2023 0.78 (L) 0.80 - 1.30 mg/dL Final   93/75/7978 9.11 0.76 - 1.27 mg/dL Final       Psychotherapy provided:  No billable psychotherapy service provided.    Patient has been given this writer's contact information as well as the [facility] urgent line number. The patient has been instructed to call 911 for emergencies.    Subjective:    Interval History: Nicholas [name] logged on ~13 minutes late after reminder call.  History of Present Illness  Nicholas [name] is a 68 year old here for evaluation of mood, sleep, and medication management.    He reports that his mood has been pretty good recently and denies experiencing periods of elevated mood or irritability. Sleeping well. Voiced some concerns about potential side effects of melatonin. For sleep, he uses trazodone  100 mg, typically taking 2 tablets as needed. He notes no side effects or problems with his current medications and reports good adherence, with Nicholas [name] supporting him in organizing his medications. Reviewed medication list today.     Ongoing mild involuntary mouth movements and grunting continue, which Nicholas [name] observes. Pt doesn't notice them very much.  With concentration, he feels able to suppress these movements and expresses some concern about these symptoms being noticeable during upcoming family gatherings. Nicholas [name] perceives the symptoms as stable, while he feels they may have improved. Occasional humming occurs, which pt attributes to being alone rather than to medication side effects.    His current medication regimen includes lithium  1350 mg at night (3 tablets), Vraylar  1.5 mg (1 capsule or tablet), Effexor  225 mg (3 capsules), and trazodone  100 mg as needed for sleep. He states he has not missed doses due to Nicholas [name]'s assistance with medication organization. He describes no recent changes in his medications and denies experiencing side effects.    Mild tingly sensations in his chest and arms occur a couple of times per week and resolve with movement. He does not associate these symptoms with his medications and has not noticed any worsening with activity. Discussed that he should talk w/ PCP about these sx and present to ED for severe pain or pain associated w/ other symptoms.       He lives with his spouse. 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 He shares grocery shopping and cooking responsibilities with his spouse. He previously maintained 2 homes, which lead to financial strain. He uses regular walking as his exercise. He plans to celebrate Christmas with his sister and brother-in-law. He reports some increased difficulty managing finances, particularly when he maintained 2 homes, which he found overwhelming. He denies significant concerns about memory or cognition and expresses a desire to avoid dementia or Alzheimer's disease. Nicholas [name] is considering retiring soon.    Discussed need for repeat lithium  labs. Nicholas [name] thinks he had recent lipid panel and A1c. Notes A1c was up at last check. I am not able to view these in epic/careeverywhere.     Discussed POC (see assessment above for details). No other Q's or concerns.        Objective:    Mental Status Exam:  Appearance:    Appears stated age, Well nourished, Well developed and Clean/Neat   Motor:   No abnormal movements noted on interview.   Speech/Language:    Normal rate, volume, tone, fluency and Language intact, well formed. No grunting or other sounds on interview today   Mood:   good   Affect:   Generally euthymic  Thought process and Associations:   Logical, linear, clear, coherent, goal directed   Abnormal/psychotic thought content:    Doing well, not reporting any SI. Denies hypomania/mania sx. Remains future oriented. Not voicing any bizarre or delusional thought content on interview.   Perceptual disturbances:      He is not endorsing any AVH. No RIS on interview.     Other:          I personally spent 28 minutes face-to-face and non-face-to-face in the care of this patient, which includes all pre, intra, and post visit time on the date of service.  All documented time was specific to the E/M visit and does not include any procedures that may have been performed.      The patient reports they are physically located in [place]  and is currently: at home. I conducted a audio/video visit. I spent  44m 42s on the video call with the patient. I spent an additional 10 minutes on pre- and post-visit activities on the date of service .     Othel KATHEE Blumenthal, MD

## 2024-03-13 MED ORDER — VENLAFAXINE ER 75 MG CAPSULE,EXTENDED RELEASE 24 HR
ORAL_CAPSULE | Freq: Every day | ORAL | 0 refills | 90.00000 days | Status: CP
Start: 2024-03-13 — End: 2024-06-11

## 2024-03-14 NOTE — Patient Instructions (Addendum)
 Follow-up instructions:  --I've ordered lithium  and metabolic labs to monitor your medications to a [facility] facility. Ideally you will get the lithium  level done about 12 hours after your last dose (give or take a few hours). Also, for the metabolic labs it is best if you are fasting.  -- Please continue taking your medications as prescribed for your mental health.   -- Do not make changes to your medications, including taking more or less than prescribed, unless under the supervision of your physician. Be aware that some medications may make you feel worse if abruptly stopped  -- Please refrain from using illicit substances, as these can affect your mood and could cause anxiety or other concerning symptoms.   -- Seek further medical care for any increase in symptoms or new symptoms such as thoughts of wanting to hurt yourself or hurt others.     Contact info:  Life-threatening emergencies: call 911 or go to the nearest ER for medical or psychiatric attention.     Issues that need urgent attention but are not life threatening: call the clinic outpatient frontdesk at [phone] for assistance.     Non-urgent routine concerns, questions, and refill requests: please send me a mychart message and I will get back to you within 2 business days. If you prefer to call, please call the front desk at [phone].    Regarding appointments:  - If you need to cancel your appointment, we ask that you call [phone] at least 24 hours before your scheduled appointment.  - If for any reason you arrive 15 minutes later than your scheduled appointment time, you may not be seen and your visit may be rescheduled.  - Please remember that we will not automatically reschedule missed appointments.  - If you miss two (2) appointments without letting us  know in advance, you will likely be referred to a provider in your community.  - We will do our best to be on time. Sometimes an emergency will arise that might cause your clinician to be late. We will try to inform you of this when you check in for your appointment. If you wait more than 15 minutes past your appointment time without such notice, please speak with the front desk staff.    In the event of bad weather, the clinic staff will attempt to contact you, should your appointment need to be rescheduled. Additionally, you can call the Patient Weather Line [phone] for system-wide clinic status    For more information and reminders regarding clinic policies (these were provided when you were admitted to the clinic), please ask the front desk.

## 2024-03-19 DIAGNOSIS — B181 Chronic viral hepatitis B without delta-agent: Principal | ICD-10-CM

## 2024-03-19 MED ORDER — TENOFOVIR DISOPROXIL FUMARATE 300 MG TABLET
ORAL_TABLET | Freq: Every day | ORAL | 1 refills | 90.00000 days | Status: CP
Start: 2024-03-19 — End: ?
  Filled 2024-03-25: qty 30, 30d supply, fill #0

## 2024-03-19 NOTE — Telephone Encounter (Signed)
 Refill request for Viread     Last clinic visit 01-03-24     Labs done 07-06-23    Entered recall for patient to make a follow-up appointment for FEBRUARY 2026    Will authorize refill at this time

## 2024-03-19 NOTE — Progress Notes (Signed)
 [facility] Refill Coordination Note    Nicholas [name], [name]: 03/25/1956  Phone: [phone] (home) [phone] (work)      All above HIPAA information was verified with patient.         03/19/2024     1:12 PM   Specialty Rx Medication Refill Questionnaire   Which Medications would you like refilled and shipped? Tenofir ( generic Vemlidy )   Please list all current allergies: On file   Have you missed any doses in the last 30 days? No   Have you had any changes to your medication(s) since your last refill? No   How much of each medication do you have remaining at home? (eg. number of tablets, injections, etc.) 10   Have you experienced any side effects in the last 30 days? No   Please enter the full address (street address, city, state, zip code) where you would like your medication(s) to be delivered to. [address] [place], [place] [address]   Please specify on which day you would like your medication(s) to arrive. Note: if you need your medication(s) within 3 days, please call the pharmacy to schedule your order at [phone]  03/26/2024   Has your insurance changed since your last refill? No   Would you like a pharmacist to call you to discuss your medication(s)? No   Do you require a signature for your package? (Note: if we are billing Medicare Part B or your order contains a controlled substance, we will require a signature) No   I have been provided my out of pocket cost for my medication and approve the pharmacy to charge the amount to my credit card on file. Yes         Completed refill call assessment today to schedule patient's medication shipment from the [facility] [phone]).  All relevant notes have been reviewed.       Confirmed patient received a Conservation Officer, Historic Buildings and a Surveyor, Mining with first shipment. The patient will receive a drug information handout for each medication shipped and additional FDA Medication Guides as required.         REFERRAL TO PHARMACIST     Referral to the pharmacist: Not needed      [facility]     Shipping address confirmed in Epic.     Delivery Scheduled: Yes, Expected medication delivery date: 11/19.     Medication will be delivered via UPS to the prescription address in Epic WAM.    Nicholas [name] [facility]

## 2024-04-04 MED ORDER — TRAZODONE 50 MG TABLET
ORAL_TABLET | 0 refills | 0.00000 days
Start: 2024-04-04 — End: ?

## 2024-04-07 MED ORDER — TRAZODONE 50 MG TABLET
ORAL_TABLET | Freq: Every evening | ORAL | 2 refills | 30.00000 days | Status: CP | PRN
Start: 2024-04-07 — End: 2024-07-06

## 2024-04-18 NOTE — Progress Notes (Signed)
 [facility] Delivery Pharm[facility] Refill Coordination Note    Nicholas [name], [place]: 04/16/1956  Phone: [phone] (home) [phone] (work)      All above HIPAA information was verified with patient.         04/17/2024     7:46 PM   Specialty Rx Medication Refill Questionnaire   Which Medications would you like refilled and shipped? Tenofir   Please list all current allergies: On file   Have you missed any doses in the last 30 days? No   Have you had any changes to your medication(s) since your last refill? No   How much of each medication do you have remaining at home? (eg. number of tablets, injections, etc.) 10   Have you experienced any side effects in the last 30 days? No   Please enter the full address (street address, city, state, zip code) where you would like your medication(s) to be delivered to. [address] [place], [place] [address]   Please specify on which day you would like your medication(s) to arrive. Note: if you need your medication(s) within 3 days, please call the pharmacy to schedule your order at [phone]  04/23/2024   Has your insurance changed since your last refill? No   Would you like a pharmacist to call you to discuss your medication(s)? No   Do you require a signature for your package? (Note: if we are billing Medicare Part B or your order contains a controlled substance, we will require a signature) No   I have been provided my out of pocket cost for my medication and approve the pharmacy to charge the amount to my credit card on file. Yes         Completed refill call assessment today to schedule patient's medication shipment from the [facility] [phone]).  All relevant notes have been reviewed.       Confirmed patient received a Conservation Officer, Historic Buildings and a Surveyor, Mining with first shipment. The patient will receive a drug information handout for each medication shipped and additional FDA Medication Guides as required. REFERRAL TO PHARMACIST     Referral to the pharmacist: Not needed      [facility]     Shipping address confirmed in Epic.     Delivery Scheduled: Yes, Expected medication delivery date: 12/17.     Medication will be delivered via UPS to the prescription address in Epic WAM.    Nicholas [name] [facility]

## 2024-04-22 MED FILL — TENOFOVIR DISOPROXIL FUMARATE 300 MG TABLET: ORAL | 30 days supply | Qty: 30 | Fill #1

## 2024-05-25 MED ORDER — LITHIUM CARBONATE ER 450 MG TABLET,EXTENDED RELEASE
ORAL_TABLET | Freq: Every evening | ORAL | 0 refills | 0.00000 days
Start: 2024-05-25 — End: ?

## 2024-05-25 NOTE — Progress Notes (Signed)
 [facility] Delivery Pharm[facility] Refill Coordination Note    Nicholas [name], : [birth year]  Phone: [phone] (home) [phone] (work)      All above HIPAA information was verified with patient.         05/22/2024     9:28 AM   Specialty Rx Medication Refill Questionnaire   Which Medications would you like refilled and shipped? Viread    Please list all [name] allergies: On File   Have you missed any doses in the last 30 days? No   Have you had any changes to your medication(s) since your last refill? Yes   Please list your medication(s) changes below. Monjaro .5ml 5mg  injection   How much of each medication do you have remaining at home? (eg. number of tablets, injections, etc.) 10   If receiving an injectable medication, next injection date is 05/27/2024   Have you experienced any side effects in the last 30 days? No   Please enter the full address (street address, city, state, zip code) where you would like your medication(s) to be delivered to. [address], [place], [place] [address]   Please specify on which day you would like your medication(s) to arrive. Note: if you need your medication(s) within 3 days, please call the pharmacy to schedule your order at [phone]  05/29/2024   Has your insurance changed since your last refill? Yes   If YES, please enter your new insurance information (include BIN, PCN, RX Group, and Member ID). Member ID: [id number] Bin: [id number] PCN: [id number] GRP: [id number]   Would you like a pharmacist to call you to discuss your medication(s)? No   Do you require a signature for your package? (Note: if we are billing Medicare Part B or your order contains a controlled substance, we will require a signature) No   I have been provided my out of pocket cost for my medication and approve the pharmacy to charge the amount to my credit card on file. Yes         Completed refill call assessment today to schedule patient's medication shipment from the [facility] [phone]).  All relevant notes have been reviewed.       Confirmed patient received a Conservation Officer, Historic Buildings and a Surveyor, Mining with first shipment. The patient will receive a drug information handout for each medication shipped and additional FDA Medication Guides as required.         REFERRAL TO PHARMACIST     Referral to the pharmacist: Not needed      [facility]     Shipping address confirmed in Epic.     Delivery Scheduled: Yes, Expected medication delivery date: 1/22.     Medication will be delivered via UPS to the prescription address in Epic WAM.    Nicholas [name] [facility] and [facility]

## 2024-05-27 MED ORDER — LITHIUM CARBONATE ER 450 MG TABLET,EXTENDED RELEASE
ORAL_TABLET | Freq: Every evening | ORAL | 0 refills | 90.00000 days | Status: CP
Start: 2024-05-27 — End: ?

## 2024-05-28 MED FILL — TENOFOVIR DISOPROXIL FUMARATE 300 MG TABLET: ORAL | 30 days supply | Qty: 30 | Fill #2

## 2024-05-29 ENCOUNTER — Ambulatory Visit: Admit: 2024-05-29 | Discharge: 2024-05-30 | Payer: Medicare (Managed Care)

## 2024-05-29 DIAGNOSIS — Z79899 Other long term (current) drug therapy: Principal | ICD-10-CM

## 2024-05-29 LAB — LIPID PANEL
CHOLESTEROL: 106 mg/dL (ref <200)
HDL CHOLESTEROL: 27 mg/dL — ABNORMAL LOW (ref >40)
LDL CHOLESTEROL CALCULATED: 55 mg/dL (ref <100)
NON-HDL CHOLESTEROL: 79 mg/dL (ref <130)
TRIGLYCERIDES: 169 mg/dL — ABNORMAL HIGH (ref <150)

## 2024-05-29 LAB — BASIC METABOLIC PANEL
ANION GAP: 10 mmol/L (ref 3–11)
BLOOD UREA NITROGEN: 7 mg/dL — ABNORMAL LOW (ref 8–20)
BUN / CREAT RATIO: 7
CALCIUM: 9.5 mg/dL (ref 8.5–10.1)
CHLORIDE: 105 mmol/L (ref 98–107)
CO2: 28.2 mmol/L (ref 21.0–32.0)
CREATININE: 1.06 mg/dL (ref 0.80–1.30)
EGFR CKD-EPI (2021) MALE: 76 mL/min/1.73m2 (ref >=60)
GLUCOSE RANDOM: 171 mg/dL (ref 70–179)
POTASSIUM: 4.5 mmol/L (ref 3.5–5.0)
SODIUM: 143 mmol/L (ref 135–145)

## 2024-05-29 LAB — TSH: THYROID STIMULATING HORMONE: 1.6 u[IU]/mL (ref 0.550–4.780)

## 2024-05-29 LAB — HEMOGLOBIN A1C
ESTIMATED AVERAGE GLUCOSE: 217 mg/dL
HEMOGLOBIN A1C: 9.2 % — ABNORMAL HIGH (ref 4.8–5.6)

## 2024-05-29 LAB — LITHIUM LEVEL: LITHIUM LEVEL: 0.8 mmol/L (ref 0.5–1.2)

## 2024-06-02 MED ORDER — VENLAFAXINE ER 75 MG CAPSULE,EXTENDED RELEASE 24 HR
ORAL_CAPSULE | Freq: Every day | ORAL | 0 refills | 90.00000 days | Status: CP
Start: 2024-06-02 — End: ?

## 2024-06-09 DIAGNOSIS — Z79899 Other long term (current) drug therapy: Principal | ICD-10-CM

## 2024-06-09 DIAGNOSIS — F3130 Bipolar disorder, current episode depressed, mild or moderate severity, unspecified: Secondary | ICD-10-CM

## 2024-06-09 DIAGNOSIS — G2401 Drug induced subacute dyskinesia: Secondary | ICD-10-CM

## 2024-06-09 MED ORDER — TRAZODONE 100 MG TABLET
ORAL_TABLET | Freq: Every evening | ORAL | 0 refills | 90.00000 days | Status: CP | PRN
Start: 2024-06-09 — End: 2024-09-07

## 2024-06-09 NOTE — Patient Instructions (Signed)
Follow-up instructions:  -- Please continue taking your medications as prescribed for your mental health.   -- Do not make changes to your medications, including taking more or less than prescribed, unless under the supervision of your physician. Be aware that some medications may make you feel worse if abruptly stopped  -- Please refrain from using illicit substances, as these can affect your mood and could cause anxiety or other concerning symptoms.   -- Seek further medical care for any increase in symptoms or new symptoms such as thoughts of wanting to hurt yourself or hurt others.     Contact info:  Life-threatening emergencies: call 911 or go to the nearest ER for medical or psychiatric attention.     Issues that need urgent attention but are not life threatening: call the clinic outpatient frontdesk at [phone] for assistance.     Non-urgent routine concerns, questions, and refill requests: please send me a mychart message and I will get back to you within 2 business days. If you prefer to call, please call the front desk at [phone].    Regarding appointments:  - If you need to cancel your appointment, we ask that you call [phone] at least 24 hours before your scheduled appointment.  - If for any reason you arrive 15 minutes later than your scheduled appointment time, you may not be seen and your visit may be rescheduled.  - Please remember that we will not automatically reschedule missed appointments.  - If you miss two (2) appointments without letting us know in advance, you will likely be referred to a provider in your community.  - We will do our best to be on time. Sometimes an emergency will arise that might cause your clinician to be late. We will try to inform you of this when you check in for your appointment. If you wait more than 15 minutes past your appointment time without such notice, please speak with the front desk staff.    In the event of bad weather, the clinic staff will attempt to contact you, should your appointment need to be rescheduled. Additionally, you can call the Patient Weather Line [phone] for system-wide clinic status    For more information and reminders regarding clinic policies (these were provided when you were admitted to the clinic), please ask the front desk.

## 2024-06-11 DIAGNOSIS — I8510 Secondary esophageal varices without bleeding: Secondary | ICD-10-CM

## 2024-06-11 DIAGNOSIS — K7469 Other cirrhosis of liver: Secondary | ICD-10-CM

## 2024-06-11 DIAGNOSIS — B181 Chronic viral hepatitis B without delta-agent: Secondary | ICD-10-CM

## 2024-06-11 DIAGNOSIS — K766 Portal hypertension: Principal | ICD-10-CM

## 2024-06-11 MED ORDER — NADOLOL 20 MG TABLET
ORAL_TABLET | Freq: Every day | ORAL | 1 refills | 90.00000 days | Status: CP
Start: 2024-06-11 — End: 2024-12-08

## 2024-06-11 NOTE — Telephone Encounter (Signed)
 Refill request for Nadolol     Last clinic visit 01/03/24    Will authorize refill at this time
